# Patient Record
Sex: Female | Born: 1960
Health system: Southern US, Community
[De-identification: ages and names within clinical notes are randomized; demographics above are authoritative.]

## PROBLEM LIST (undated history)

## (undated) DIAGNOSIS — R079 Chest pain, unspecified: Secondary | ICD-10-CM

## (undated) DIAGNOSIS — G8918 Other acute postprocedural pain: Secondary | ICD-10-CM

## (undated) DIAGNOSIS — M797 Fibromyalgia: Secondary | ICD-10-CM

## (undated) DIAGNOSIS — G8929 Other chronic pain: Secondary | ICD-10-CM

## (undated) DIAGNOSIS — F32A Depression, unspecified: Secondary | ICD-10-CM

## (undated) DIAGNOSIS — K219 Gastro-esophageal reflux disease without esophagitis: Secondary | ICD-10-CM

## (undated) DIAGNOSIS — M1990 Unspecified osteoarthritis, unspecified site: Secondary | ICD-10-CM

## (undated) DIAGNOSIS — E7800 Pure hypercholesterolemia, unspecified: Secondary | ICD-10-CM

## (undated) DIAGNOSIS — F419 Anxiety disorder, unspecified: Secondary | ICD-10-CM

## (undated) DIAGNOSIS — F329 Major depressive disorder, single episode, unspecified: Secondary | ICD-10-CM

## (undated) HISTORY — DX: Fibromyalgia: M79.7

## (undated) HISTORY — DX: Pure hypercholesterolemia, unspecified: E78.00

## (undated) HISTORY — DX: Other acute postprocedural pain: G89.18

## (undated) HISTORY — PX: REDUCTION MAMMAPLASTY: SUR839

## (undated) HISTORY — DX: Major depressive disorder, single episode, unspecified: F32.9

## (undated) HISTORY — DX: Other chronic pain: G89.29

## (undated) HISTORY — DX: Anxiety disorder, unspecified: F41.9

## (undated) HISTORY — DX: Unspecified osteoarthritis, unspecified site: M19.90

## (undated) HISTORY — DX: Chest pain, unspecified: R07.9

## (undated) HISTORY — DX: Gastro-esophageal reflux disease without esophagitis: K21.9

## (undated) HISTORY — PX: CHOLECYSTECTOMY: SHX55

## (undated) HISTORY — DX: Depression, unspecified: F32.A

---

## 2006-04-27 ENCOUNTER — Ambulatory Visit: Payer: Self-pay | Admitting: Internal Medicine

## 2006-05-06 ENCOUNTER — Ambulatory Visit: Payer: Self-pay | Admitting: Internal Medicine

## 2006-11-07 HISTORY — PX: BACK SURGERY: SHX140

## 2007-09-21 ENCOUNTER — Inpatient Hospital Stay (HOSPITAL_COMMUNITY): Admission: RE | Admit: 2007-09-21 | Discharge: 2007-09-28 | Payer: Self-pay | Admitting: Neurosurgery

## 2011-03-22 NOTE — Op Note (Signed)
Jennifer Harmon, Jennifer Harmon              ACCOUNT NO.:  [id number]   MEDICAL RECORD NO.:  [id number]          PATIENT TYPE:  INP   LOCATION:  2899                         FACILITY:  [facility]   PHYSICIAN:  Danae Orleans. Venetia Maxon, M.D.  DATE OF BIRTH:  May 11, 1961   DATE OF PROCEDURE:  09/21/2007  DATE OF DISCHARGE:                               OPERATIVE REPORT   PREOPERATIVE DIAGNOSIS:  L4-L5 and L5-S1 spondylolisthesis with  spondylosis, herniated disc with lumbar radiculopathy.   POSTOPERATIVE DIAGNOSIS:  L4-L5 and L5-S1 spondylolisthesis with  spondylosis, herniated disc with lumbar radiculopathy.   PROCEDURE:  L4 and L5 Gill with L4-L5 and L5-S1 posterior lumbar  interbody fusion with PEEK interbody cages with morselized bone  autograft and bone morphogenic protein, pedicle screw fixation  bilaterally L4 through S1 levels with posterolateral arthrodesis, nerve  root monitoring.   SURGEON:  Danae Orleans. Venetia Maxon, M.D.   ASSISTANT:  Coletta Memos, M.D.  Georgiann Cocker, R.N.   ANESTHESIA:  General endotracheal anesthesia.   ESTIMATED BLOOD LOSS:  300 mL.   COMPLICATIONS:  None.   DISPOSITION:  To recovery.   INDICATIONS:  [name] is a 50 year old woman with bilateral  lower extremity pain, right greater than left, with L4-L5 mobile  spondylolisthesis greater than L5-S1 mobile spondylolisthesis with  significant spondylosis and foraminal stenosis.  We elected to take her  to surgery for decompression and fusion at these affected levels.   DESCRIPTION OF PROCEDURE:  [name] was brought to the operating  room.  Following satisfactory and uncomplicated induction of general  endotracheal anesthesia, placement of intravenous lines, and Foley  catheter, electrodes were placed overlying the L4, L5 and S1 nerve roots  and the patient was placed in a prone position on the [place] table.  Her low back was prepped and draped in the usual sterile fashion.  The  area of planned incision was  infiltrated with 0.25% Marcaine and 0.5%  lidocaine with 1:200,000 epinephrine.  The incision was made in the  midline and carried through copious adipose tissue.  The lumbodorsal  fascia was incised bilaterally.  Subperiosteal dissection was performed  exposing the L4 through S1 laminae, transverse processes, and sacral  alae.  A self-retaining retractor was placed to facilitate exposure.  The spinous processes were removed at L4 and L5 and laminectomies of L4  and L5 were also performed with decompression of the thecal sac, L4, L5,  and S1 nerve roots bilaterally.  There was marked adherent ligamentous  tissue on the L4 nerve root on the right.  This was carefully  decompressed under loupe magnification.  There was bilateral  decompression at both sets of nerve roots.  Baxano probes were passed  out the neural foramen, both ipsilaterally and contralaterally, without  difficulty.  A thorough discectomy was then performed at L4-L5 and L5-S1  levels and after scraping the endplates, a size 9 PEEK interbody spacer  was packed with BMP and also bone autograft.  A piece of BMP soaked  sponge was placed deep in the interspace and 9 mm PEEK cages were placed  bilaterally along with additional bone  autograft.  This was performed at  both the L4-L5 and L5-S1 levels.  Subsequently, pedicle screw fixation  was then placed bilaterally at L4, L5 and S1 levels.  The left L4  pedicle screw was somewhat lateral but had good purchase on manipulation  and appeared to be solidly in bone, so it was left in position.  All  screws were confirmed on AP and lateral fluoroscopy and EMG monitoring  demonstrated no abnormal stimulation suggestive of proximity to the  nerve roots.  60 mm pre-lordosed rods were placed and locked down in  situ and remaining bone autograft was placed posterolaterally on the  left.  The wound had been irrigated prior to placing the final bone  graft. The lumbodorsal fascia was closed  with #1 Vicryl sutures,  subcutaneous tissues were reapproximated with 2-0 Vicryl interrupted  inverted sutures, and the skin edges were reapproximated with  interrupted 3-0 Vicryl subcuticular stitch.  The wound was dressed with  Benzoin and Steri-Strips, Telfa gauze and tape.  The patient was  extubated in operating room and taken to the recovery room in stable  satisfactory condition having tolerated the operation well.  Counts were  correct at the end of the case.      Danae Orleans. Venetia Maxon, M.D.  Electronically Signed     JDS/MEDQ  D:  09/21/2007  T:  09/22/2007  Job:  [id number]

## 2011-03-25 NOTE — Discharge Summary (Signed)
Jennifer Harmon, Jennifer Harmon              ACCOUNT NO.:  [id number]   MEDICAL RECORD NO.:  [id number]          PATIENT TYPE:  INP   LOCATION:  3006                         FACILITY:  [facility]   PHYSICIAN:  Danae Orleans. Venetia Maxon, M.D.  DATE OF BIRTH:  Mar 09, 1961   DATE OF ADMISSION:  09/21/2007  DATE OF DISCHARGE:  09/28/2007                               DISCHARGE SUMMARY   REASON FOR ADMISSION:  Lumbar spondylolisthesis.   ADDITIONAL DIAGNOSIS:  Obesity, history of tobacco use, lumbar disc  displacement, lumbar sacral spondylosis, lumbar radiculopathy,  esophageal reflux.   FINAL DIAGNOSES:  Obesity, history of tobacco use, lumbar disc  displacement, lumbar sacral spondylosis, lumbar radiculopathy,  esophageal reflux.   HISTORY OF ILLNESS AND HOSPITAL COURSE:  Jennifer Harmon is a 50 year old  woman who had significant L4/L5 and L5/S1 spondylolisthesis with  stenosis.  She was admitted to the hospital and underwent uncomplicated  decompression and fusions L4/L5, and L5/S1 levels.  Postoperatively was  gradually mobilized.  Was given morphine PCA.  Was complaining of some  leg discomfort, which felt that it was most likely secondary to  cholesterol-lowering medications as it did not appear to be radicular in  nature.  She was gradually mobilized and working with physical therapy  and was discharged home on November 21, having tolerated the operation  and hospitalization well with instructions to follow up with Dr. Venetia Maxon  in the office in 3 weeks.  She was given rolling walker, prescription  for Percocet 5/625 one to two every 4-6 hours as needed for pain, Valium  10 mg every 8 hours  as needed for muscular spasm.      Danae Orleans. Venetia Maxon, M.D.  Electronically Signed     JDS/MEDQ  D:  11/09/2007  T:  11/09/2007  Job:  [id number]

## 2011-04-20 DIAGNOSIS — R079 Chest pain, unspecified: Secondary | ICD-10-CM

## 2011-04-26 DIAGNOSIS — R072 Precordial pain: Secondary | ICD-10-CM

## 2011-04-27 ENCOUNTER — Encounter: Payer: Self-pay | Admitting: Cardiology

## 2011-05-03 ENCOUNTER — Encounter: Payer: Self-pay | Admitting: Cardiology

## 2011-05-13 ENCOUNTER — Encounter: Payer: Self-pay | Admitting: Cardiovascular Disease

## 2011-08-16 LAB — TYPE AND SCREEN: Antibody Screen: NEGATIVE

## 2011-08-16 LAB — BASIC METABOLIC PANEL
BUN: 8
Calcium: 9.5
Creatinine, Ser: 0.66
GFR calc non Af Amer: >60

## 2011-08-16 LAB — CBC
HCT: 41.8
RBC: 4.81
RDW: 12.6
WBC: 10.6 — ABNORMAL HIGH

## 2011-08-16 LAB — ABO/RH: ABO/RH(D): B POS

## 2011-12-27 DIAGNOSIS — Z96653 Presence of artificial knee joint, bilateral: Secondary | ICD-10-CM | POA: Insufficient documentation

## 2012-03-14 DIAGNOSIS — M1711 Unilateral primary osteoarthritis, right knee: Secondary | ICD-10-CM | POA: Insufficient documentation

## 2016-09-19 DIAGNOSIS — N62 Hypertrophy of breast: Secondary | ICD-10-CM | POA: Insufficient documentation

## 2016-12-13 DIAGNOSIS — Z79891 Long term (current) use of opiate analgesic: Secondary | ICD-10-CM | POA: Diagnosis not present

## 2016-12-15 DIAGNOSIS — K219 Gastro-esophageal reflux disease without esophagitis: Secondary | ICD-10-CM | POA: Diagnosis not present

## 2016-12-15 DIAGNOSIS — Z299 Encounter for prophylactic measures, unspecified: Secondary | ICD-10-CM | POA: Diagnosis not present

## 2016-12-15 DIAGNOSIS — Z6835 Body mass index (BMI) 35.0-35.9, adult: Secondary | ICD-10-CM | POA: Diagnosis not present

## 2016-12-15 DIAGNOSIS — J069 Acute upper respiratory infection, unspecified: Secondary | ICD-10-CM | POA: Diagnosis not present

## 2016-12-15 DIAGNOSIS — Z713 Dietary counseling and surveillance: Secondary | ICD-10-CM | POA: Diagnosis not present

## 2016-12-15 DIAGNOSIS — R52 Pain, unspecified: Secondary | ICD-10-CM | POA: Diagnosis not present

## 2016-12-23 DIAGNOSIS — Z789 Other specified health status: Secondary | ICD-10-CM | POA: Diagnosis not present

## 2016-12-23 DIAGNOSIS — J069 Acute upper respiratory infection, unspecified: Secondary | ICD-10-CM | POA: Diagnosis not present

## 2016-12-23 DIAGNOSIS — I10 Essential (primary) hypertension: Secondary | ICD-10-CM | POA: Diagnosis not present

## 2016-12-23 DIAGNOSIS — Z6835 Body mass index (BMI) 35.0-35.9, adult: Secondary | ICD-10-CM | POA: Diagnosis not present

## 2016-12-23 DIAGNOSIS — Z713 Dietary counseling and surveillance: Secondary | ICD-10-CM | POA: Diagnosis not present

## 2016-12-23 DIAGNOSIS — Z299 Encounter for prophylactic measures, unspecified: Secondary | ICD-10-CM | POA: Diagnosis not present

## 2016-12-23 DIAGNOSIS — K219 Gastro-esophageal reflux disease without esophagitis: Secondary | ICD-10-CM | POA: Diagnosis not present

## 2016-12-23 DIAGNOSIS — E7800 Pure hypercholesterolemia, unspecified: Secondary | ICD-10-CM | POA: Diagnosis not present

## 2017-01-05 DIAGNOSIS — M797 Fibromyalgia: Secondary | ICD-10-CM | POA: Diagnosis not present

## 2017-01-05 DIAGNOSIS — Z6834 Body mass index (BMI) 34.0-34.9, adult: Secondary | ICD-10-CM | POA: Diagnosis not present

## 2017-01-05 DIAGNOSIS — Z713 Dietary counseling and surveillance: Secondary | ICD-10-CM | POA: Diagnosis not present

## 2017-01-05 DIAGNOSIS — Z299 Encounter for prophylactic measures, unspecified: Secondary | ICD-10-CM | POA: Diagnosis not present

## 2017-01-05 DIAGNOSIS — J069 Acute upper respiratory infection, unspecified: Secondary | ICD-10-CM | POA: Diagnosis not present

## 2017-01-05 DIAGNOSIS — M5416 Radiculopathy, lumbar region: Secondary | ICD-10-CM | POA: Diagnosis not present

## 2017-01-05 DIAGNOSIS — K219 Gastro-esophageal reflux disease without esophagitis: Secondary | ICD-10-CM | POA: Diagnosis not present

## 2017-01-05 DIAGNOSIS — I10 Essential (primary) hypertension: Secondary | ICD-10-CM | POA: Diagnosis not present

## 2017-01-12 DIAGNOSIS — Z79891 Long term (current) use of opiate analgesic: Secondary | ICD-10-CM | POA: Diagnosis not present

## 2017-01-12 DIAGNOSIS — M5136 Other intervertebral disc degeneration, lumbar region: Secondary | ICD-10-CM | POA: Diagnosis not present

## 2017-01-12 DIAGNOSIS — M797 Fibromyalgia: Secondary | ICD-10-CM | POA: Diagnosis not present

## 2017-01-12 DIAGNOSIS — G894 Chronic pain syndrome: Secondary | ICD-10-CM | POA: Diagnosis not present

## 2017-02-21 DIAGNOSIS — H40013 Open angle with borderline findings, low risk, bilateral: Secondary | ICD-10-CM | POA: Diagnosis not present

## 2017-03-16 DIAGNOSIS — M47816 Spondylosis without myelopathy or radiculopathy, lumbar region: Secondary | ICD-10-CM | POA: Diagnosis not present

## 2017-03-16 DIAGNOSIS — Z79891 Long term (current) use of opiate analgesic: Secondary | ICD-10-CM | POA: Diagnosis not present

## 2017-03-16 DIAGNOSIS — M5136 Other intervertebral disc degeneration, lumbar region: Secondary | ICD-10-CM | POA: Diagnosis not present

## 2017-03-16 DIAGNOSIS — G8929 Other chronic pain: Secondary | ICD-10-CM | POA: Diagnosis not present

## 2017-03-16 DIAGNOSIS — M797 Fibromyalgia: Secondary | ICD-10-CM | POA: Diagnosis not present

## 2017-03-21 DIAGNOSIS — Z299 Encounter for prophylactic measures, unspecified: Secondary | ICD-10-CM | POA: Diagnosis not present

## 2017-03-21 DIAGNOSIS — Z713 Dietary counseling and surveillance: Secondary | ICD-10-CM | POA: Diagnosis not present

## 2017-03-21 DIAGNOSIS — Z6836 Body mass index (BMI) 36.0-36.9, adult: Secondary | ICD-10-CM | POA: Diagnosis not present

## 2017-03-21 DIAGNOSIS — F329 Major depressive disorder, single episode, unspecified: Secondary | ICD-10-CM | POA: Diagnosis not present

## 2017-03-21 DIAGNOSIS — G4730 Sleep apnea, unspecified: Secondary | ICD-10-CM | POA: Diagnosis not present

## 2017-03-21 DIAGNOSIS — M1990 Unspecified osteoarthritis, unspecified site: Secondary | ICD-10-CM | POA: Diagnosis not present

## 2017-03-21 DIAGNOSIS — K5711 Diverticulosis of small intestine without perforation or abscess with bleeding: Secondary | ICD-10-CM | POA: Diagnosis not present

## 2017-03-21 DIAGNOSIS — E785 Hyperlipidemia, unspecified: Secondary | ICD-10-CM | POA: Diagnosis not present

## 2017-03-21 DIAGNOSIS — I10 Essential (primary) hypertension: Secondary | ICD-10-CM | POA: Diagnosis not present

## 2017-05-17 DIAGNOSIS — G8929 Other chronic pain: Secondary | ICD-10-CM | POA: Diagnosis not present

## 2017-05-17 DIAGNOSIS — M47816 Spondylosis without myelopathy or radiculopathy, lumbar region: Secondary | ICD-10-CM | POA: Diagnosis not present

## 2017-06-14 DIAGNOSIS — M5136 Other intervertebral disc degeneration, lumbar region: Secondary | ICD-10-CM | POA: Diagnosis not present

## 2017-06-14 DIAGNOSIS — Z6836 Body mass index (BMI) 36.0-36.9, adult: Secondary | ICD-10-CM | POA: Diagnosis not present

## 2017-06-14 DIAGNOSIS — E785 Hyperlipidemia, unspecified: Secondary | ICD-10-CM | POA: Diagnosis not present

## 2017-06-14 DIAGNOSIS — Z299 Encounter for prophylactic measures, unspecified: Secondary | ICD-10-CM | POA: Diagnosis not present

## 2017-06-14 DIAGNOSIS — I10 Essential (primary) hypertension: Secondary | ICD-10-CM | POA: Diagnosis not present

## 2017-06-14 DIAGNOSIS — G4730 Sleep apnea, unspecified: Secondary | ICD-10-CM | POA: Diagnosis not present

## 2017-06-14 DIAGNOSIS — F419 Anxiety disorder, unspecified: Secondary | ICD-10-CM | POA: Diagnosis not present

## 2017-06-14 DIAGNOSIS — F329 Major depressive disorder, single episode, unspecified: Secondary | ICD-10-CM | POA: Diagnosis not present

## 2017-06-14 DIAGNOSIS — G8929 Other chronic pain: Secondary | ICD-10-CM | POA: Diagnosis not present

## 2017-06-30 DIAGNOSIS — M5136 Other intervertebral disc degeneration, lumbar region: Secondary | ICD-10-CM | POA: Insufficient documentation

## 2017-06-30 DIAGNOSIS — Z96651 Presence of right artificial knee joint: Secondary | ICD-10-CM | POA: Insufficient documentation

## 2017-06-30 DIAGNOSIS — M19072 Primary osteoarthritis, left ankle and foot: Secondary | ICD-10-CM

## 2017-06-30 DIAGNOSIS — Z8719 Personal history of other diseases of the digestive system: Secondary | ICD-10-CM | POA: Insufficient documentation

## 2017-06-30 DIAGNOSIS — Z8659 Personal history of other mental and behavioral disorders: Secondary | ICD-10-CM | POA: Insufficient documentation

## 2017-06-30 DIAGNOSIS — Z8669 Personal history of other diseases of the nervous system and sense organs: Secondary | ICD-10-CM | POA: Insufficient documentation

## 2017-06-30 DIAGNOSIS — M19071 Primary osteoarthritis, right ankle and foot: Secondary | ICD-10-CM | POA: Insufficient documentation

## 2017-06-30 DIAGNOSIS — E785 Hyperlipidemia, unspecified: Secondary | ICD-10-CM | POA: Insufficient documentation

## 2017-06-30 DIAGNOSIS — K529 Noninfective gastroenteritis and colitis, unspecified: Secondary | ICD-10-CM | POA: Insufficient documentation

## 2017-06-30 DIAGNOSIS — Z87891 Personal history of nicotine dependence: Secondary | ICD-10-CM | POA: Insufficient documentation

## 2017-06-30 DIAGNOSIS — Z96652 Presence of left artificial knee joint: Secondary | ICD-10-CM | POA: Insufficient documentation

## 2017-06-30 DIAGNOSIS — M797 Fibromyalgia: Secondary | ICD-10-CM | POA: Insufficient documentation

## 2017-06-30 NOTE — Progress Notes (Signed)
Office Visit Note  Patient: Jennifer Harmon             Date of Birth: 1961/03/05           MRN: [id number]             PCP: Glenda Chroman, MD Referring: Glenda Chroman, MD Visit Date: 07/03/2017 Occupation: on dusability    Subjective:  Pain in hands.   History of Present Illness: Jennifer Harmon is a 56 y.o. female  returns after her last visit in May 2016. Patient was diagnosed with seronegative inflammatory arthritis. She was started on Plaquenil. She had inadequate response to Plaquenil. She lost follow-up after May 2016. According to patient she stopped Plaquenil as she felt it was not working well. She states over time her hands have been getting worse. She has noticed that her hands are more deformed and she is feeling some knots on her forearms and the skin. She has pain in her right shoulder and right elbow as well. She has pain all over her body. She has some discomfort in her bilateral knee replacement also. She also has generalized pain from fibromyalgia.  Activities of Daily Living:  Patient reports morning stiffness for 2 hours.   Patient Reports nocturnal pain.  Difficulty dressing/grooming: Reports Difficulty climbing stairs: Reports Difficulty getting out of chair: Reports Difficulty using hands for taps, buttons, cutlery, and/or writing: Reports   Review of Systems  Constitutional: Positive for fatigue. Negative for night sweats, weight gain, weight loss and weakness.  HENT: Positive for mouth dryness. Negative for mouth sores, trouble swallowing, trouble swallowing and nose dryness.   Eyes: Positive for dryness. Negative for pain, redness and visual disturbance.  Respiratory: Negative for cough, shortness of breath and difficulty breathing.   Cardiovascular: Negative for chest pain, palpitations, hypertension, irregular heartbeat and swelling in legs/feet.  Gastrointestinal: Negative for blood in stool, constipation and diarrhea.  Endocrine: Negative for  increased urination.  Genitourinary: Negative for vaginal dryness.  Musculoskeletal: Positive for arthralgias, joint pain, myalgias, morning stiffness and myalgias. Negative for joint swelling, muscle weakness and muscle tenderness.  Skin: Negative for color change, rash, hair loss, skin tightness, ulcers and sensitivity to sunlight.  Allergic/Immunologic: Negative for susceptible to infections.  Neurological: Negative for dizziness, memory loss and night sweats.  Hematological: Negative for swollen glands.  Psychiatric/Behavioral: Positive for depressed mood. Negative for sleep disturbance. The patient is nervous/anxious.     PMFS History:  Patient Active Problem List   Diagnosis Date Noted  . Dyslipidemia 06/30/2017  . History of gastroesophageal reflux (GERD) 06/30/2017  . History of sleep apnea 06/30/2017  . History of depression 06/30/2017  . History of anxiety 06/30/2017  . Fibromyalgia 06/30/2017  . DDD (degenerative disc disease), lumbar 06/30/2017  . Status post left partial knee replacement 06/30/2017  . Status post total knee replacement, right 06/30/2017  . Primary osteoarthritis of both feet 06/30/2017  . Other chronic pain 06/30/2017  . History of diverticulosis 06/30/2017  . Former smoker 06/30/2017  . Chronic diarrhea 06/30/2017    Past Medical History:  Diagnosis Date  . Anxiety   . Chronic chest pain   . Depression   . DJD (degenerative joint disease)   . Fibromyalgia   . GERD (gastroesophageal reflux disease)   . Hypercholesterolemia     Family History  Problem Relation Age of Onset  . Hypertension Mother   . Arthritis Father    Past Surgical History:  Procedure Laterality Date  .  BACK SURGERY  2008  . CESAREAN SECTION    . CHOLECYSTECTOMY     Social History   Social History Narrative  . No narrative on file     Objective: Vital Signs: BP (!) 142/78   Pulse 82   Resp 16   Ht 5\' 3"  (1.6 m)   Wt 200 lb (90.7 kg)   BMI 35.43 kg/m     Physical Exam  Constitutional: She is oriented to person, place, and time. She appears well-developed and well-nourished.  HENT:  Head: Normocephalic and atraumatic.  Eyes: Conjunctivae and EOM are normal.  Neck: Normal range of motion.  Cardiovascular: Normal rate, regular rhythm, normal heart sounds and intact distal pulses.   Pulmonary/Chest: Effort normal and breath sounds normal.  Abdominal: Soft. Bowel sounds are normal.  Lymphadenopathy:    She has no cervical adenopathy.  Neurological: She is alert and oriented to person, place, and time.  Skin: Skin is warm and dry. Capillary refill takes less than 2 seconds.  Psychiatric: She has a normal mood and affect. Her behavior is normal.  Nursing note and vitals reviewed.    Musculoskeletal Exam: C-spine limited range of motion thoracic spine good range of motion lumbar spine limited painful range of motion. She has discomfort range of motion of her right shoulder. Elbow joints good range of motion. She has limited range of motion of her wrist joints with some discomfort and tenderness over her MCP joints. Hip joints are good range of motion. She has some bilateral knee replacement without much discomfort. Ankles MTPs PIPs did not have much inflammation.  CDAI Exam: No CDAI exam completed.    Investigation: Findings:  August 2014 CBC normal, CMP normal, CK normal, UA normal, ESR normal, ANA negative, ENA negative, RF negative, anti-CCP negative, Ace 65, TB gold negative, SPEP normal, immunoglobulins normal, HIV negative, G6PD normal, Chest x-ray normal    Imaging: September 2014 Extrem Up Bilat Comp  Result Date: 07/03/2017 Ultrasound examination of bilateral hands was performed per EULAR recommendations. Using 12 MHz transducer, grayscale and power Doppler bilateral second, third, and fifth MCP joints and bilateral wrist joints both dorsal and volar aspects were evaluated to look for synovitis or tenosynovitis. The findings were there was  no synovitis or tenosynovitis on ultrasound examination. Right median nerve was 0.11 cm squares which was within normal limits and left median nerve was 0.12 cm squares which was within normal limits. Impression: Ultrasound examination of bilateral hands did not show any synovitis.  Xr Hand 2 View Left  Result Date: 07/03/2017 PIP/DIP narrowing was noted. No MCP joint narrowing was noted. CMC joint narrowing was noted. No intercarpal joint space narrowing was noted. No erosive changes were noted. These findings were consistent with osteoarthritis.  Xr Hand 2 View Right  Result Date: 07/03/2017 PIP/DIP narrowing was noted. Some spurring noted at first second and fourth MCP joint. No significant intercarpal joint space narrowing was noted. No erosive changes were noted. Impression: Findings were consistent with osteoarthritis of the hand  Xr Shoulder Right  Result Date: 07/03/2017 No significant glenohumeral joint space narrowing was noted. Some osteoarthritic changes and inferior spurring was noted.   Speciality Comments: No specialty comments available.    Procedures:  No procedures performed Allergies: Bupropion and Tape   Assessment / Plan:     Visit Diagnoses: Chronic inflammatory arthritis - Seronegative. She was given a trial of Plaquenil about 2 years ago. She lost follow-up after that. She states she continues to have pain and  swelling in her hands. I do not see any synovitis on examination today. I'll obtain following labs today. I also obtain x-rays of her bilateral hands today which were unremarkable except for some osteoarthritic changes. We decided to proceed with ultrasound examination of bilateral hands to look for synovitis. The ultrasound did not show any synovitis in her wrists joints or her MCP joints. At this point I believe she has some underlying osteoarthritis. - Plan: Urinalysis, Routine w reflex microscopic, Sedimentation rate, Rheumatoid factor, Cyclic citrul peptide  antibody, IgG, 14-3-3 eta Protein, Ferritin, Iron and TIBC, Magnesium  High risk medication use - Treated with Plaquenil in the past with inadequate response.  Chronic right shoulder pain - Plan: XR Shoulder Right  Pain in both hands - Plan: XR Hand 2 View Right, XR Hand 2 View Left, Ferritin, Iron and TIBC, Magnesium, [place] Extrem Up Bilat Comp  Other fatigue - Plan: CBC with Differential/Platelet, COMPLETE METABOLIC PANEL WITH GFR, CK, TSH, Ferritin, Iron and TIBC, Magnesium   Status post total knee replacement, right: Chronic pain  Status post left partial knee replacement: Chronic pain  Primary osteoarthritis of both feet: Proper fitting shoes discussed.  DDD (degenerative disc disease), lumbar - Status post discectomy. She continues to have discomfort in her lower back.  Fibromyalgia: She has generalized pain from fibromyalgia which is her major problem.  Her other medical problems are listed as follows: History of anxiety  History of depression  History of sleep apnea - On CPAP  History of gastroesophageal reflux (GERD)  Dyslipidemia  Other chronic pain - Followed up by Dr. Francesco Runner. She is on Lyrica, oxycodone, Flexeril, Klonopin.  History of diverticulosis  Former smoker  Chronic diarrhea - Post cholecystectomy    Orders: Orders Placed This Encounter  Procedures  . XR Shoulder Right  . XR Hand 2 View Right  . XR Hand 2 View Left  . [place] Extrem Up Bilat Comp  . CBC with Differential/Platelet  . COMPLETE METABOLIC PANEL WITH GFR  . Urinalysis, Routine w reflex microscopic  . Sedimentation rate  . CK  . TSH  . Rheumatoid factor  . Cyclic citrul peptide antibody, IgG  . 14-3-3 eta Protein  . Ferritin  . Iron and TIBC  . Magnesium   No orders of the defined types were placed in this encounter.   Face-to-face time spent with patient was 60 minutes.  Greater than 50% of time was spent in counseling and coordination of care.  Follow-Up Instructions:  one-month   Bo Merino, MD  Note - This record has been created using Editor, commissioning.  Chart creation errors have been sought, but may not always  have been located. Such creation errors do not reflect on  the standard of medical care.

## 2017-07-03 ENCOUNTER — Ambulatory Visit (INDEPENDENT_AMBULATORY_CARE_PROVIDER_SITE_OTHER): Payer: PPO

## 2017-07-03 ENCOUNTER — Encounter: Payer: Self-pay | Admitting: Rheumatology

## 2017-07-03 ENCOUNTER — Ambulatory Visit (INDEPENDENT_AMBULATORY_CARE_PROVIDER_SITE_OTHER): Payer: PPO | Admitting: Rheumatology

## 2017-07-03 ENCOUNTER — Inpatient Hospital Stay (INDEPENDENT_AMBULATORY_CARE_PROVIDER_SITE_OTHER): Payer: PPO

## 2017-07-03 VITALS — BP 142/78 | HR 82 | Resp 16 | Ht 63.0 in | Wt 200.0 lb

## 2017-07-03 DIAGNOSIS — Z79899 Other long term (current) drug therapy: Secondary | ICD-10-CM

## 2017-07-03 DIAGNOSIS — M19071 Primary osteoarthritis, right ankle and foot: Secondary | ICD-10-CM

## 2017-07-03 DIAGNOSIS — G8929 Other chronic pain: Secondary | ICD-10-CM

## 2017-07-03 DIAGNOSIS — M797 Fibromyalgia: Secondary | ICD-10-CM

## 2017-07-03 DIAGNOSIS — Z96652 Presence of left artificial knee joint: Secondary | ICD-10-CM

## 2017-07-03 DIAGNOSIS — Z8669 Personal history of other diseases of the nervous system and sense organs: Secondary | ICD-10-CM

## 2017-07-03 DIAGNOSIS — Z8659 Personal history of other mental and behavioral disorders: Secondary | ICD-10-CM

## 2017-07-03 DIAGNOSIS — M79641 Pain in right hand: Secondary | ICD-10-CM | POA: Diagnosis not present

## 2017-07-03 DIAGNOSIS — M79642 Pain in left hand: Secondary | ICD-10-CM | POA: Diagnosis not present

## 2017-07-03 DIAGNOSIS — Z8719 Personal history of other diseases of the digestive system: Secondary | ICD-10-CM | POA: Diagnosis not present

## 2017-07-03 DIAGNOSIS — M5136 Other intervertebral disc degeneration, lumbar region: Secondary | ICD-10-CM | POA: Diagnosis not present

## 2017-07-03 DIAGNOSIS — E785 Hyperlipidemia, unspecified: Secondary | ICD-10-CM

## 2017-07-03 DIAGNOSIS — Z96651 Presence of right artificial knee joint: Secondary | ICD-10-CM

## 2017-07-03 DIAGNOSIS — K529 Noninfective gastroenteritis and colitis, unspecified: Secondary | ICD-10-CM

## 2017-07-03 DIAGNOSIS — M19072 Primary osteoarthritis, left ankle and foot: Secondary | ICD-10-CM

## 2017-07-03 DIAGNOSIS — M1990 Unspecified osteoarthritis, unspecified site: Secondary | ICD-10-CM | POA: Diagnosis not present

## 2017-07-03 DIAGNOSIS — M25511 Pain in right shoulder: Secondary | ICD-10-CM | POA: Diagnosis not present

## 2017-07-03 DIAGNOSIS — R5383 Other fatigue: Secondary | ICD-10-CM

## 2017-07-03 DIAGNOSIS — Z87891 Personal history of nicotine dependence: Secondary | ICD-10-CM

## 2017-07-04 LAB — RHEUMATOID FACTOR

## 2017-07-04 LAB — COMPLETE METABOLIC PANEL WITH GFR
ALBUMIN: 4.6 g/dL (ref 3.6–5.1)
ALK PHOS: 131 U/L — AB (ref 33–130)
ALT: 23 U/L (ref 6–29)
AST: 18 U/L (ref 10–35)
BUN: 16 mg/dL (ref 7–25)
CALCIUM: 10.2 mg/dL (ref 8.6–10.4)
CO2: 26 mmol/L (ref 20–32)
Chloride: 100 mmol/L (ref 98–110)
Creat: 0.69 mg/dL (ref 0.50–1.05)
Glucose, Bld: 99 mg/dL (ref 65–99)
Potassium: 4.3 mmol/L (ref 3.5–5.3)
Sodium: 142 mmol/L (ref 135–146)
TOTAL PROTEIN: 7.2 g/dL (ref 6.1–8.1)
Total Bilirubin: 0.4 mg/dL (ref 0.2–1.2)

## 2017-07-04 LAB — FERRITIN: Ferritin: 47 ng/mL (ref 10–232)

## 2017-07-04 LAB — IRON AND TIBC
%SAT: 15 % (ref 11–50)
IRON: 55 ug/dL (ref 45–160)
TIBC: 360 ug/dL (ref 250–450)
UIBC: 305 ug/dL

## 2017-07-04 LAB — CBC WITH DIFFERENTIAL/PLATELET
BASOS ABS: 89 {cells}/uL (ref 0–200)
BASOS PCT: 1 %
EOS ABS: 267 {cells}/uL (ref 15–500)
Eosinophils Relative: 3 %
HEMATOCRIT: 43.3 % (ref 35.0–45.0)
HEMOGLOBIN: 14 g/dL (ref 11.7–15.5)
Lymphocytes Relative: 27 %
Lymphs Abs: 2403 cells/uL (ref 850–3900)
MCH: 28.3 pg (ref 27.0–33.0)
MCHC: 32.3 g/dL (ref 32.0–36.0)
MCV: 87.5 fL (ref 80.0–100.0)
MONO ABS: 445 {cells}/uL (ref 200–950)
MONOS PCT: 5 %
MPV: 10.1 fL (ref 7.5–12.5)
NEUTROS ABS: 5696 {cells}/uL (ref 1500–7800)
Neutrophils Relative %: 64 %
PLATELETS: 197 10*3/uL (ref 140–400)
RBC: 4.95 MIL/uL (ref 3.80–5.10)
RDW: 14 % (ref 11.0–15.0)
WBC: 8.9 10*3/uL (ref 3.8–10.8)

## 2017-07-04 LAB — URINALYSIS, ROUTINE W REFLEX MICROSCOPIC
BILIRUBIN URINE: NEGATIVE
GLUCOSE, UA: NEGATIVE
Hgb urine dipstick: NEGATIVE
Ketones, ur: NEGATIVE
LEUKOCYTES UA: NEGATIVE
Nitrite: NEGATIVE
PH: 6.5 (ref 5.0–8.0)
Protein, ur: NEGATIVE
SPECIFIC GRAVITY, URINE: 1.025 (ref 1.001–1.035)

## 2017-07-04 LAB — CK: Total CK: 130 U/L (ref 29–143)

## 2017-07-04 LAB — MAGNESIUM: Magnesium: 2 mg/dL (ref 1.5–2.5)

## 2017-07-04 LAB — TSH: TSH: 2.33 m[IU]/L

## 2017-07-04 LAB — SEDIMENTATION RATE: Sed Rate: 5 mm/hr (ref 0–30)

## 2017-07-04 LAB — CYCLIC CITRUL PEPTIDE ANTIBODY, IGG

## 2017-07-05 DIAGNOSIS — G4733 Obstructive sleep apnea (adult) (pediatric): Secondary | ICD-10-CM | POA: Diagnosis not present

## 2017-07-06 LAB — 14-3-3 ETA PROTEIN

## 2017-07-07 NOTE — Progress Notes (Signed)
Labs are normal. We'll discuss at follow-up visit.

## 2017-07-21 DIAGNOSIS — Z6834 Body mass index (BMI) 34.0-34.9, adult: Secondary | ICD-10-CM | POA: Diagnosis not present

## 2017-07-21 DIAGNOSIS — Z1389 Encounter for screening for other disorder: Secondary | ICD-10-CM | POA: Diagnosis not present

## 2017-07-21 DIAGNOSIS — Z0000 Encounter for general adult medical examination without abnormal findings: Secondary | ICD-10-CM | POA: Diagnosis not present

## 2017-07-21 DIAGNOSIS — R5383 Other fatigue: Secondary | ICD-10-CM | POA: Diagnosis not present

## 2017-07-21 DIAGNOSIS — Z1211 Encounter for screening for malignant neoplasm of colon: Secondary | ICD-10-CM | POA: Diagnosis not present

## 2017-07-21 DIAGNOSIS — E7800 Pure hypercholesterolemia, unspecified: Secondary | ICD-10-CM | POA: Diagnosis not present

## 2017-07-21 DIAGNOSIS — G8929 Other chronic pain: Secondary | ICD-10-CM | POA: Diagnosis not present

## 2017-07-21 DIAGNOSIS — Z7189 Other specified counseling: Secondary | ICD-10-CM | POA: Diagnosis not present

## 2017-07-21 DIAGNOSIS — Z299 Encounter for prophylactic measures, unspecified: Secondary | ICD-10-CM | POA: Diagnosis not present

## 2017-07-21 DIAGNOSIS — Z79899 Other long term (current) drug therapy: Secondary | ICD-10-CM | POA: Diagnosis not present

## 2017-07-21 DIAGNOSIS — M5416 Radiculopathy, lumbar region: Secondary | ICD-10-CM | POA: Diagnosis not present

## 2017-07-21 DIAGNOSIS — F419 Anxiety disorder, unspecified: Secondary | ICD-10-CM | POA: Diagnosis not present

## 2017-07-24 DIAGNOSIS — M1990 Unspecified osteoarthritis, unspecified site: Secondary | ICD-10-CM | POA: Insufficient documentation

## 2017-07-24 NOTE — Progress Notes (Deleted)
Office Visit Note  Patient: Jennifer Harmon             Date of Birth: [birth year]           MRN: [id number]             PCP: Ignatius Specking, MD Referring: Ignatius Specking, MD Visit Date: 08/03/2017 Occupation: @GUAROCC @    Subjective:  No chief complaint on file.   History of Present Illness: Jennifer Harmon is a 56 y.o. female ***   Activities of Daily Living:  Patient reports morning stiffness for *** {minute/hour:19697}.   Patient {ACTIONS;DENIES/REPORTS:21021675::"Denies"} nocturnal pain.  Difficulty dressing/grooming: {ACTIONS;DENIES/REPORTS:21021675::"Denies"} Difficulty climbing stairs: {ACTIONS;DENIES/REPORTS:21021675::"Denies"} Difficulty getting out of chair: {ACTIONS;DENIES/REPORTS:21021675::"Denies"} Difficulty using hands for taps, buttons, cutlery, and/or writing: {ACTIONS;DENIES/REPORTS:21021675::"Denies"}   No Rheumatology ROS completed.   PMFS History:  Patient Active Problem List   Diagnosis Date Noted  . Chronic inflammatory arthritis 07/24/2017  . Dyslipidemia 06/30/2017  . History of gastroesophageal reflux (GERD) 06/30/2017  . History of sleep apnea 06/30/2017  . History of depression 06/30/2017  . History of anxiety 06/30/2017  . Fibromyalgia 06/30/2017  . DDD (degenerative disc disease), lumbar 06/30/2017  . Status post left partial knee replacement 06/30/2017  . Status post total knee replacement, right 06/30/2017  . Primary osteoarthritis of both feet 06/30/2017  . Other chronic pain 06/30/2017  . History of diverticulosis 06/30/2017  . Former smoker 06/30/2017  . Chronic diarrhea 06/30/2017    Past Medical History:  Diagnosis Date  . Anxiety   . Chronic chest pain   . Depression   . DJD (degenerative joint disease)   . Fibromyalgia   . GERD (gastroesophageal reflux disease)   . Hypercholesterolemia     Family History  Problem Relation Age of Onset  . Hypertension Mother   . Arthritis Father    Past Surgical History:  Procedure  Laterality Date  . BACK SURGERY  2008  . CESAREAN SECTION    . CHOLECYSTECTOMY     Social History   Social History Narrative  . No narrative on file     Objective: Vital Signs: There were no vitals taken for this visit.   Physical Exam   Musculoskeletal Exam: ***  CDAI Exam: No CDAI exam completed.    Investigation: Findings:  Urinalysis, Routine w reflex microscopic, Sedimentation rate, Rheumatoid factor, Cyclic citrul peptide antibody, IgG, 14-3-3 eta Protein, Ferritin, Iron and TIBC, Magnesium all normal / negative   CBC Latest Ref Rng & Units 07/03/2017 09/21/2007 09/19/2007  WBC 3.8 - 10.8 K/uL 8.9 17.2 **Please note change in CBC/Diff reference range**(H) 10.6 **Please note change in CBC/Diff reference range**(H)  Hemoglobin 11.7 - 15.5 g/dL 13/10/2007 11.5(L) 14.4  Hematocrit 35.0 - 45.0 % 43.3 33.7(L) 41.8  Platelets 140 - 400 K/uL 197 267 DELTA CHECK NOTED 366     CMP Latest Ref Rng & Units 07/03/2017 09/27/2007  Glucose 65 - 99 mg/dL 99 98  BUN 7 - 25 mg/dL 16 8  Creatinine 09/29/2007 - 1.05 mg/dL 3.23 5.57  Sodium 3.22 - 146 mmol/L 142 142  Potassium 3.5 - 5.3 mmol/L 4.3 3.7  Chloride 98 - 110 mmol/L 100 103  CO2 20 - 32 mmol/L 26 28  Calcium 8.6 - 10.4 mg/dL 025 9.5  Total Protein 6.1 - 8.1 g/dL 7.2 -  Total Bilirubin 0.2 - 1.2 mg/dL 0.4 -  Alkaline Phos 33 - 130 U/L 131(H) -  AST 10 - 35 U/L 18 -  ALT 6 -  29 U/L 23 -    Imaging: [place] Extrem Up Bilat Comp  Result Date: 07/03/2017 Ultrasound examination of bilateral hands was performed per EULAR recommendations. Using 12 MHz transducer, grayscale and power Doppler bilateral second, third, and fifth MCP joints and bilateral wrist joints both dorsal and volar aspects were evaluated to look for synovitis or tenosynovitis. The findings were there was no synovitis or tenosynovitis on ultrasound examination. Right median nerve was 0.11 cm squares which was within normal limits and left median nerve was 0.12 cm squares which  was within normal limits. Impression: Ultrasound examination of bilateral hands did not show any synovitis.  Xr Hand 2 View Left  Result Date: 07/03/2017 PIP/DIP narrowing was noted. No MCP joint narrowing was noted. CMC joint narrowing was noted. No intercarpal joint space narrowing was noted. No erosive changes were noted. These findings were consistent with osteoarthritis.  Xr Hand 2 View Right  Result Date: 07/03/2017 PIP/DIP narrowing was noted. Some spurring noted at first second and fourth MCP joint. No significant intercarpal joint space narrowing was noted. No erosive changes were noted. Impression: Findings were consistent with osteoarthritis of the hand  Xr Shoulder Right  Result Date: 07/03/2017 No significant glenohumeral joint space narrowing was noted. Some osteoarthritic changes and inferior spurring was noted.   Speciality Comments: No specialty comments available.    Procedures:  No procedures performed Allergies: Bupropion and Tape   Assessment / Plan:     Visit Diagnoses: Chronic inflammatory arthritis  DDD (degenerative disc disease), lumbar s/p discectomy  History of fibromyalgia  Other chronic pain  History of gastroesophageal reflux (GERD)  History of sleep apnea  History of diverticulosis  History of depression  History of anxiety  Primary osteoarthritis of both feet  Status post left partial knee replacement  Status post total knee replacement, right  History of hyperlipidemia    Orders: No orders of the defined types were placed in this encounter.  No orders of the defined types were placed in this encounter.   Face-to-face time spent with patient was *** minutes. 50% of time was spent in counseling and coordination of care.  Follow-Up Instructions: No Follow-up on file.   Amy Littrell, RT  Note - This record has been created using [employer].  Chart creation errors have been sought, but may not always  have been  located. Such creation errors do not reflect on  the standard of medical care.

## 2017-08-03 ENCOUNTER — Ambulatory Visit: Payer: Self-pay | Admitting: Rheumatology

## 2017-08-11 DIAGNOSIS — K219 Gastro-esophageal reflux disease without esophagitis: Secondary | ICD-10-CM | POA: Diagnosis not present

## 2017-08-11 DIAGNOSIS — K5909 Other constipation: Secondary | ICD-10-CM | POA: Diagnosis not present

## 2017-08-11 DIAGNOSIS — Z299 Encounter for prophylactic measures, unspecified: Secondary | ICD-10-CM | POA: Diagnosis not present

## 2017-08-11 DIAGNOSIS — G4730 Sleep apnea, unspecified: Secondary | ICD-10-CM | POA: Diagnosis not present

## 2017-08-11 DIAGNOSIS — E7800 Pure hypercholesterolemia, unspecified: Secondary | ICD-10-CM | POA: Diagnosis not present

## 2017-08-11 DIAGNOSIS — M5416 Radiculopathy, lumbar region: Secondary | ICD-10-CM | POA: Diagnosis not present

## 2017-08-11 DIAGNOSIS — I10 Essential (primary) hypertension: Secondary | ICD-10-CM | POA: Diagnosis not present

## 2017-08-11 DIAGNOSIS — G8929 Other chronic pain: Secondary | ICD-10-CM | POA: Diagnosis not present

## 2017-08-11 DIAGNOSIS — M5136 Other intervertebral disc degeneration, lumbar region: Secondary | ICD-10-CM | POA: Diagnosis not present

## 2017-08-13 NOTE — Progress Notes (Signed)
Office Visit Note  Patient: Jennifer Harmon             Date of Birth: 1960/11/14           MRN: [id number]             PCP: Ignatius Specking, MD Referring: Ignatius Specking, MD Visit Date: 08/24/2017 Occupation: @GUAROCC @    Subjective:  Pain right shoulder and hands.   History of Present Illness: Jennifer Harmon is a 56 y.o. female with history of osteoarthritis, fibromyalgia and disc disease. She's been having a lot of pain and discomfort in her right shoulder. She states she has difficulty lifting her right arm. She also has discomfort in her bilateral hands and her knee joints. She also has some lower back pain.  Activities of Daily Living:  Patient reports morning stiffness for 1 hour.   Patient Reports nocturnal pain.  Difficulty dressing/grooming: Denies Difficulty climbing stairs: Reports Difficulty getting out of chair: Reports Difficulty using hands for taps, buttons, cutlery, and/or writing: Reports   Review of Systems  Constitutional: Positive for fatigue. Negative for night sweats, weight gain, weight loss and weakness.  HENT: Positive for mouth sores. Negative for trouble swallowing, trouble swallowing, mouth dryness and nose dryness.   Eyes: Positive for dryness. Negative for pain, redness and visual disturbance.  Respiratory: Negative for cough, shortness of breath and difficulty breathing.   Cardiovascular: Negative for chest pain, palpitations, hypertension, irregular heartbeat and swelling in legs/feet.  Gastrointestinal: Positive for constipation. Negative for blood in stool and diarrhea.  Endocrine: Negative for increased urination.  Genitourinary: Negative for vaginal dryness.  Musculoskeletal: Positive for arthralgias, joint pain, myalgias, morning stiffness and myalgias. Negative for joint swelling, muscle weakness and muscle tenderness.  Skin: Negative for color change, rash, hair loss, skin tightness, ulcers and sensitivity to sunlight.    Allergic/Immunologic: Negative for susceptible to infections.  Neurological: Negative for dizziness, memory loss and night sweats.  Hematological: Negative for swollen glands.  Psychiatric/Behavioral: Positive for depressed mood and sleep disturbance. The patient is nervous/anxious.     PMFS History:  Patient Active Problem List   Diagnosis Date Noted  . Chronic inflammatory arthritis 07/24/2017  . Dyslipidemia 06/30/2017  . History of gastroesophageal reflux (GERD) 06/30/2017  . History of sleep apnea 06/30/2017  . History of depression 06/30/2017  . History of anxiety 06/30/2017  . Fibromyalgia 06/30/2017  . DDD (degenerative disc disease), lumbar 06/30/2017  . Status post left partial knee replacement 06/30/2017  . Status post total knee replacement, right 06/30/2017  . Primary osteoarthritis of both feet 06/30/2017  . Other chronic pain 06/30/2017  . History of diverticulosis 06/30/2017  . Former smoker 06/30/2017  . Chronic diarrhea 06/30/2017    Past Medical History:  Diagnosis Date  . Anxiety   . Chronic chest pain   . Depression   . DJD (degenerative joint disease)   . Fibromyalgia   . GERD (gastroesophageal reflux disease)   . Hypercholesterolemia     Family History  Problem Relation Age of Onset  . Hypertension Mother   . Arthritis Father    Past Surgical History:  Procedure Laterality Date  . BACK SURGERY  2008  . CESAREAN SECTION    . CHOLECYSTECTOMY     Social History   Social History Narrative  . No narrative on file     Objective: Vital Signs: BP 136/84 (BP Location: Left Arm, Patient Position: Sitting, Cuff Size: Normal)   Pulse 95  Ht 5\' 3"  (1.6 m)   Wt 202 lb (91.6 kg)   BMI 35.78 kg/m    Physical Exam  Constitutional: She is oriented to person, place, and time. She appears well-developed and well-nourished.  HENT:  Head: Normocephalic and atraumatic.  Eyes: Conjunctivae and EOM are normal.  Neck: Normal range of motion.   Cardiovascular: Normal rate, regular rhythm, normal heart sounds and intact distal pulses.   Pulmonary/Chest: Effort normal and breath sounds normal.  Abdominal: Soft. Bowel sounds are normal.  Lymphadenopathy:    She has no cervical adenopathy.  Neurological: She is alert and oriented to person, place, and time.  Skin: Skin is warm and dry. Capillary refill takes less than 2 seconds.  Psychiatric: She has a normal mood and affect. Her behavior is normal.  Nursing note and vitals reviewed.    Musculoskeletal Exam: C-spine and thoracic lumbar spine limited range of motion with discomfort. She has right shoulder joint painful abduction limited to about 50. Left shoulder joint, bilateral elbow joints, wrist joints with good range of motion with no synovitis. She is tenderness over MCPs and PIPs but no synovitis was noted. She is some osteoarthritic changes in her hands and feet. She had left partial knee replacement and right total knee replacement which is still painful. She generalized hyperalgesia from fibromyalgia.  CDAI Exam: No CDAI exam completed.    Investigation: No additional findings. CBC Latest Ref Rng & Units 07/03/2017 09/21/2007 09/19/2007  WBC 3.8 - 10.8 K/uL 8.9 17.2 **Please note change in CBC/Diff reference range**(H) 10.6 **Please note change in CBC/Diff reference range**(H)  Hemoglobin 11.7 - 15.5 g/dL 13/10/2007 11.5(L) 14.4  Hematocrit 35.0 - 45.0 % 43.3 33.7(L) 41.8  Platelets 140 - 400 K/uL 197 267 DELTA CHECK NOTED 366   CMP Latest Ref Rng & Units 07/03/2017 09/27/2007  Glucose 65 - 99 mg/dL 99 98  BUN 7 - 25 mg/dL 16 8  Creatinine 09/29/2007 - 1.05 mg/dL 4.43 1.54  Sodium 0.08 - 146 mmol/L 142 142  Potassium 3.5 - 5.3 mmol/L 4.3 3.7  Chloride 98 - 110 mmol/L 100 103  CO2 20 - 32 mmol/L 26 28  Calcium 8.6 - 10.4 mg/dL 676 9.5  Total Protein 6.1 - 8.1 g/dL 7.2 -  Total Bilirubin 0.2 - 1.2 mg/dL 0.4 -  Alkaline Phos 33 - 130 U/L 131(H) -  AST 10 - 35 U/L 18 -  ALT 6 -  29 U/L 23 -    Imaging: Xr Shoulder Right  Result Date: 08/24/2017 No significant glenohumeral joint space narrowing was noted. Inferior spurring was noted. Some cystic changes were noted. No chondrocalcinosis was noted.   Speciality Comments: No specialty comments available.    Procedures:  Large Joint Inj Date/Time: 08/24/2017 9:22 AM Performed by: 08/26/2017 Authorized by: Pollyann Savoy   Consent Given by:  Patient Site marked: the procedure site was marked   Timeout: prior to procedure the correct patient, procedure, and site was verified   Indications:  Pain Location:  Shoulder Site:  R glenohumeral Prep: patient was prepped and draped in usual sterile fashion   Needle Size:  27 G Needle Length:  1.5 inches Approach:  Posterior Ultrasound Guidance: No   Fluoroscopic Guidance: No   Arthrogram: No   Medications:  1 mL lidocaine 1 %; 40 mg triamcinolone acetonide 40 MG/ML Aspiration Attempted: Yes   Aspirate amount (mL):  0 Patient tolerance:  Patient tolerated the procedure well with no immediate complications     Allergies:  Bupropion and Tape   Assessment / Plan:     Visit Diagnoses: Pain in both hands - All autoimmune w/u negative in the past. 06/2017 [place] bilateral hands did not show synovitis.I do not see any synovitis on examination . She continues to have some arthralgias. She does have some underlying osteoarthritis in her hands.  Chronic right shoulder pain - Plan: XR Shoulder Right. The x-ray showed some osteoarthritic changes present. Spurring but no glenohumeral joint space narrowing was noted. After informed consent was obtain the right shoulder was injected with cortisone as described above. She.with the procedure well. I've advised her if her symptoms persist she is to contact us. I will also refer her to physical therapy.  S/P total knee replacement, right: Chronic pain  Status post left partial knee replacement: Chronic pain  Primary  osteoarthritis of both feet: Chronic putting shoes were discussed.  DDD (degenerative disc disease), lumbar - Status post discectomy: She's been having increased lower back pain lately.  Fibromyalgia: She was in pain management and now she's having increased pain.  Other fatigue: Stage II insomnia  History of sleep apnea - On CPAP  Other chronic pain - Followed up by Dr. Laurian Brim. She is on Lyrica, oxycodone, Flexeril, Klonopin.  She has several other medical problems which are listed below:  History of anxiety  History of depression  History of gastroesophageal reflux (GERD)  Dyslipidemia  History of diverticulosis  History of cholecystectomy  Former smoker    Orders: Orders Placed This Encounter  Procedures  . Large Joint Injection/Arthrocentesis  . XR Shoulder Right  . Ambulatory referral to Physical Therapy   No orders of the defined types were placed in this encounter.   Face-to-face time spent with patient was 30 minutes. Greater than 50% of time was spent in counseling and coordination of care.  Follow-Up Instructions: Return in about 1 year (around 08/24/2018), or if symptoms worsen or fail to improve, for Osteoarthritis,DDD, FMS.   Pollyann Savoy, MD  Note - This record has been created using Animal nutritionist.  Chart creation errors have been sought, but may not always  have been located. Such creation errors do not reflect on  the standard of medical care.

## 2017-08-24 ENCOUNTER — Ambulatory Visit (INDEPENDENT_AMBULATORY_CARE_PROVIDER_SITE_OTHER): Payer: PPO | Admitting: Rheumatology

## 2017-08-24 ENCOUNTER — Encounter: Payer: Self-pay | Admitting: Rheumatology

## 2017-08-24 ENCOUNTER — Ambulatory Visit (INDEPENDENT_AMBULATORY_CARE_PROVIDER_SITE_OTHER): Payer: PPO

## 2017-08-24 VITALS — BP 136/84 | HR 95 | Ht 63.0 in | Wt 202.0 lb

## 2017-08-24 DIAGNOSIS — G8929 Other chronic pain: Secondary | ICD-10-CM | POA: Diagnosis not present

## 2017-08-24 DIAGNOSIS — Z87891 Personal history of nicotine dependence: Secondary | ICD-10-CM

## 2017-08-24 DIAGNOSIS — M25511 Pain in right shoulder: Secondary | ICD-10-CM

## 2017-08-24 DIAGNOSIS — M79641 Pain in right hand: Secondary | ICD-10-CM | POA: Diagnosis not present

## 2017-08-24 DIAGNOSIS — E785 Hyperlipidemia, unspecified: Secondary | ICD-10-CM

## 2017-08-24 DIAGNOSIS — M19071 Primary osteoarthritis, right ankle and foot: Secondary | ICD-10-CM

## 2017-08-24 DIAGNOSIS — M797 Fibromyalgia: Secondary | ICD-10-CM

## 2017-08-24 DIAGNOSIS — Z96651 Presence of right artificial knee joint: Secondary | ICD-10-CM

## 2017-08-24 DIAGNOSIS — Z96652 Presence of left artificial knee joint: Secondary | ICD-10-CM

## 2017-08-24 DIAGNOSIS — M79642 Pain in left hand: Secondary | ICD-10-CM

## 2017-08-24 DIAGNOSIS — Z8669 Personal history of other diseases of the nervous system and sense organs: Secondary | ICD-10-CM

## 2017-08-24 DIAGNOSIS — R5383 Other fatigue: Secondary | ICD-10-CM | POA: Diagnosis not present

## 2017-08-24 DIAGNOSIS — Z8719 Personal history of other diseases of the digestive system: Secondary | ICD-10-CM | POA: Diagnosis not present

## 2017-08-24 DIAGNOSIS — Z9049 Acquired absence of other specified parts of digestive tract: Secondary | ICD-10-CM

## 2017-08-24 DIAGNOSIS — M5136 Other intervertebral disc degeneration, lumbar region: Secondary | ICD-10-CM | POA: Diagnosis not present

## 2017-08-24 DIAGNOSIS — Z8659 Personal history of other mental and behavioral disorders: Secondary | ICD-10-CM

## 2017-08-24 DIAGNOSIS — M19072 Primary osteoarthritis, left ankle and foot: Secondary | ICD-10-CM

## 2017-08-24 MED ORDER — LIDOCAINE HCL 1 % IJ SOLN
1.0000 mL | INTRAMUSCULAR | Status: AC | PRN
  Administered 2017-08-24: 1 mL

## 2017-08-24 MED ORDER — TRIAMCINOLONE ACETONIDE 40 MG/ML IJ SUSP
40.0000 mg | INTRAMUSCULAR | Status: AC | PRN
  Administered 2017-08-24: 40 mg via INTRA_ARTICULAR

## 2017-08-24 NOTE — Patient Instructions (Signed)
Shoulder Exercises Ask your health care provider which exercises are safe for you. Do exercises exactly as told by your health care provider and adjust them as directed. It is normal to feel mild stretching, pulling, tightness, or discomfort as you do these exercises, but you should stop right away if you feel sudden pain or your pain gets worse.Do not begin these exercises until told by your health care provider. RANGE OF MOTION EXERCISES These exercises warm up your muscles and joints and improve the movement and flexibility of your shoulder. These exercises also help to relieve pain, numbness, and tingling. These exercises involve stretching your injured shoulder directly. Exercise A: Pendulum  1. Stand near a wall or a surface that you can hold onto for balance. 2. Bend at the waist and let your left / right arm hang straight down. Use your other arm to support you. Keep your back straight and do not lock your knees. 3. Relax your left / right arm and shoulder muscles, and move your hips and your trunk so your left / right arm swings freely. Your arm should swing because of the motion of your body, not because you are using your arm or shoulder muscles. 4. Keep moving your body so your arm swings in the following directions, as told by your health care provider: ? Side to side. ? Forward and backward. ? In clockwise and counterclockwise circles. 5. Continue each motion for __________ seconds, or for as long as told by your health care provider. 6. Slowly return to the starting position. Repeat __________ times. Complete this exercise __________ times a day. Exercise B:Flexion, Standing  1. Stand and hold a broomstick, a cane, or a similar object. Place your hands a little more than shoulder-width apart on the object. Your left / right hand should be palm-up, and your other hand should be palm-down. 2. Keep your elbow straight and keep your shoulder muscles relaxed. Push the stick down with  your healthy arm to raise your left / right arm in front of your body, and then over your head until you feel a stretch in your shoulder. ? Avoid shrugging your shoulder while you raise your arm. Keep your shoulder blade tucked down toward the middle of your back. 3. Hold for __________ seconds. 4. Slowly return to the starting position. Repeat __________ times. Complete this exercise __________ times a day. Exercise C: Abduction, Standing 1. Stand and hold a broomstick, a cane, or a similar object. Place your hands a little more than shoulder-width apart on the object. Your left / right hand should be palm-up, and your other hand should be palm-down. 2. While keeping your elbow straight and your shoulder muscles relaxed, push the stick across your body toward your left / right side. Raise your left / right arm to the side of your body and then over your head until you feel a stretch in your shoulder. ? Do not raise your arm above shoulder height, unless your health care provider tells you to do that. ? Avoid shrugging your shoulder while you raise your arm. Keep your shoulder blade tucked down toward the middle of your back. 3. Hold for __________ seconds. 4. Slowly return to the starting position. Repeat __________ times. Complete this exercise __________ times a day. Exercise D:Internal Rotation  1. Place your left / right hand behind your back, palm-up. 2. Use your other hand to dangle an exercise band, a towel, or a similar object over your shoulder. Grasp the band with   your left / right hand so you are holding onto both ends. 3. Gently pull up on the band until you feel a stretch in the front of your left / right shoulder. ? Avoid shrugging your shoulder while you raise your arm. Keep your shoulder blade tucked down toward the middle of your back. 4. Hold for __________ seconds. 5. Release the stretch by letting go of the band and lowering your hands. Repeat __________ times. Complete  this exercise __________ times a day. STRETCHING EXERCISES These exercises warm up your muscles and joints and improve the movement and flexibility of your shoulder. These exercises also help to relieve pain, numbness, and tingling. These exercises are done using your healthy shoulder to help stretch the muscles of your injured shoulder. Exercise E: Corner Stretch (External Rotation and Abduction)  1. Stand in a doorway with one of your feet slightly in front of the other. This is called a staggered stance. If you cannot reach your forearms to the door frame, stand facing a corner of a room. 2. Choose one of the following positions as told by your health care provider: ? Place your hands and forearms on the door frame above your head. ? Place your hands and forearms on the door frame at the height of your head. ? Place your hands on the door frame at the height of your elbows. 3. Slowly move your weight onto your front foot until you feel a stretch across your chest and in the front of your shoulders. Keep your head and chest upright and keep your abdominal muscles tight. 4. Hold for __________ seconds. 5. To release the stretch, shift your weight to your back foot. Repeat __________ times. Complete this stretch __________ times a day. Exercise F:Extension, Standing 1. Stand and hold a broomstick, a cane, or a similar object behind your back. ? Your hands should be a little wider than shoulder-width apart. ? Your palms should face away from your back. 2. Keeping your elbows straight and keeping your shoulder muscles relaxed, move the stick away from your body until you feel a stretch in your shoulder. ? Avoid shrugging your shoulders while you move the stick. Keep your shoulder blade tucked down toward the middle of your back. 3. Hold for __________ seconds. 4. Slowly return to the starting position. Repeat __________ times. Complete this exercise __________ times a day. STRENGTHENING  EXERCISES These exercises build strength and endurance in your shoulder. Endurance is the ability to use your muscles for a long time, even after they get tired. Exercise G:External Rotation  1. Sit in a stable chair without armrests. 2. Secure an exercise band at elbow height on your left / right side. 3. Place a soft object, such as a folded towel or a small pillow, between your left / right upper arm and your body to move your elbow a few inches away (about 10 cm) from your side. 4. Hold the end of the band so it is tight and there is no slack. 5. Keeping your elbow pressed against the soft object, move your left / right forearm out, away from your abdomen. Keep your body steady so only your forearm moves. 6. Hold for __________ seconds. 7. Slowly return to the starting position. Repeat __________ times. Complete this exercise __________ times a day. Exercise H:Shoulder Abduction  1. Sit in a stable chair without armrests, or stand. 2. Hold a __________ weight in your left / right hand, or hold an exercise band with both hands.   3. Start with your arms straight down and your left / right palm facing in, toward your body. 4. Slowly lift your left / right hand out to your side. Do not lift your hand above shoulder height unless your health care provider tells you that this is safe. ? Keep your arms straight. ? Avoid shrugging your shoulder while you do this movement. Keep your shoulder blade tucked down toward the middle of your back. 5. Hold for __________ seconds. 6. Slowly lower your arm, and return to the starting position. Repeat __________ times. Complete this exercise __________ times a day. Exercise I:Shoulder Extension 1. Sit in a stable chair without armrests, or stand. 2. Secure an exercise band to a stable object in front of you where it is at shoulder height. 3. Hold one end of the exercise band in each hand. Your palms should face each other. 4. Straighten your elbows and  lift your hands up to shoulder height. 5. Step back, away from the secured end of the exercise band, until the band is tight and there is no slack. 6. Squeeze your shoulder blades together as you pull your hands down to the sides of your thighs. Stop when your hands are straight down by your sides. Do not let your hands go behind your body. 7. Hold for __________ seconds. 8. Slowly return to the starting position. Repeat __________ times. Complete this exercise __________ times a day. Exercise J:Standing Shoulder Row 1. Sit in a stable chair without armrests, or stand. 2. Secure an exercise band to a stable object in front of you so it is at waist height. 3. Hold one end of the exercise band in each hand. Your palms should be in a thumbs-up position. 4. Bend each of your elbows to an "L" shape (about 90 degrees) and keep your upper arms at your sides. 5. Step back until the band is tight and there is no slack. 6. Slowly pull your elbows back behind you. 7. Hold for __________ seconds. 8. Slowly return to the starting position. Repeat __________ times. Complete this exercise __________ times a day. Exercise K:Shoulder Press-Ups  1. Sit in a stable chair that has armrests. Sit upright, with your feet flat on the floor. 2. Put your hands on the armrests so your elbows are bent and your fingers are pointing forward. Your hands should be about even with the sides of your body. 3. Push down on the armrests and use your arms to lift yourself off of the chair. Straighten your elbows and lift yourself up as much as you comfortably can. ? Move your shoulder blades down, and avoid letting your shoulders move up toward your ears. ? Keep your feet on the ground. As you get stronger, your feet should support less of your body weight as you lift yourself up. 4. Hold for __________ seconds. 5. Slowly lower yourself back into the chair. Repeat __________ times. Complete this exercise __________ times a  day. Exercise L: Wall Push-Ups  1. Stand so you are facing a stable wall. Your feet should be about one arm-length away from the wall. 2. Lean forward and place your palms on the wall at shoulder height. 3. Keep your feet flat on the floor as you bend your elbows and lean forward toward the wall. 4. Hold for __________ seconds. 5. Straighten your elbows to push yourself back to the starting position. Repeat __________ times. Complete this exercise __________ times a day. This information is not intended to replace advice   given to you by your health care provider. Make sure you discuss any questions you have with your health care provider. Document Released: 09/07/2005 Document Revised: 07/18/2016 Document Reviewed: 07/05/2015 [employer] Interactive Patient Education  2018 [employer]  

## 2017-09-01 DIAGNOSIS — R202 Paresthesia of skin: Secondary | ICD-10-CM | POA: Diagnosis not present

## 2017-09-01 DIAGNOSIS — M79601 Pain in right arm: Secondary | ICD-10-CM | POA: Diagnosis not present

## 2017-09-01 DIAGNOSIS — M25511 Pain in right shoulder: Secondary | ICD-10-CM | POA: Diagnosis not present

## 2017-09-01 DIAGNOSIS — M542 Cervicalgia: Secondary | ICD-10-CM | POA: Diagnosis not present

## 2017-09-01 DIAGNOSIS — M6281 Muscle weakness (generalized): Secondary | ICD-10-CM | POA: Diagnosis not present

## 2017-09-06 ENCOUNTER — Ambulatory Visit
Admission: RE | Admit: 2017-09-06 | Discharge: 2017-09-06 | Disposition: A | Discharge status: Home / Self Care | Payer: PPO | Source: Ambulatory Visit | Attending: Nurse Practitioner | Admitting: Nurse Practitioner

## 2017-09-06 ENCOUNTER — Other Ambulatory Visit
Admission: RE | Admit: 2017-09-06 | Discharge: 2017-09-06 | Disposition: A | Discharge status: Home / Self Care | Payer: PPO | Source: Ambulatory Visit | Attending: Nurse Practitioner | Admitting: Nurse Practitioner

## 2017-09-06 ENCOUNTER — Encounter: Payer: Self-pay | Admitting: Nurse Practitioner

## 2017-09-06 ENCOUNTER — Ambulatory Visit: Payer: PPO | Attending: Nurse Practitioner | Admitting: Nurse Practitioner

## 2017-09-06 DIAGNOSIS — Z789 Other specified health status: Secondary | ICD-10-CM | POA: Diagnosis not present

## 2017-09-06 DIAGNOSIS — M542 Cervicalgia: Secondary | ICD-10-CM | POA: Diagnosis not present

## 2017-09-06 DIAGNOSIS — M533 Sacrococcygeal disorders, not elsewhere classified: Secondary | ICD-10-CM

## 2017-09-06 DIAGNOSIS — M79601 Pain in right arm: Secondary | ICD-10-CM | POA: Diagnosis not present

## 2017-09-06 DIAGNOSIS — F329 Major depressive disorder, single episode, unspecified: Secondary | ICD-10-CM | POA: Insufficient documentation

## 2017-09-06 DIAGNOSIS — G894 Chronic pain syndrome: Secondary | ICD-10-CM | POA: Diagnosis not present

## 2017-09-06 DIAGNOSIS — Z79891 Long term (current) use of opiate analgesic: Secondary | ICD-10-CM

## 2017-09-06 DIAGNOSIS — M79605 Pain in left leg: Secondary | ICD-10-CM

## 2017-09-06 DIAGNOSIS — M791 Myalgia, unspecified site: Secondary | ICD-10-CM

## 2017-09-06 DIAGNOSIS — Z96653 Presence of artificial knee joint, bilateral: Secondary | ICD-10-CM | POA: Insufficient documentation

## 2017-09-06 DIAGNOSIS — Z981 Arthrodesis status: Secondary | ICD-10-CM | POA: Insufficient documentation

## 2017-09-06 DIAGNOSIS — M25561 Pain in right knee: Principal | ICD-10-CM

## 2017-09-06 DIAGNOSIS — E7800 Pure hypercholesterolemia, unspecified: Secondary | ICD-10-CM | POA: Diagnosis not present

## 2017-09-06 DIAGNOSIS — Z9889 Other specified postprocedural states: Secondary | ICD-10-CM | POA: Insufficient documentation

## 2017-09-06 DIAGNOSIS — M5441 Lumbago with sciatica, right side: Secondary | ICD-10-CM | POA: Insufficient documentation

## 2017-09-06 DIAGNOSIS — M25519 Pain in unspecified shoulder: Secondary | ICD-10-CM

## 2017-09-06 DIAGNOSIS — M47812 Spondylosis without myelopathy or radiculopathy, cervical region: Secondary | ICD-10-CM | POA: Diagnosis not present

## 2017-09-06 DIAGNOSIS — M5442 Lumbago with sciatica, left side: Secondary | ICD-10-CM

## 2017-09-06 DIAGNOSIS — E785 Hyperlipidemia, unspecified: Secondary | ICD-10-CM | POA: Insufficient documentation

## 2017-09-06 DIAGNOSIS — Z9049 Acquired absence of other specified parts of digestive tract: Secondary | ICD-10-CM | POA: Diagnosis not present

## 2017-09-06 DIAGNOSIS — Z7982 Long term (current) use of aspirin: Secondary | ICD-10-CM | POA: Diagnosis not present

## 2017-09-06 DIAGNOSIS — M79604 Pain in right leg: Secondary | ICD-10-CM

## 2017-09-06 DIAGNOSIS — M179 Osteoarthritis of knee, unspecified: Secondary | ICD-10-CM | POA: Diagnosis not present

## 2017-09-06 DIAGNOSIS — Z87891 Personal history of nicotine dependence: Secondary | ICD-10-CM | POA: Diagnosis not present

## 2017-09-06 DIAGNOSIS — Z79899 Other long term (current) drug therapy: Secondary | ICD-10-CM | POA: Diagnosis not present

## 2017-09-06 DIAGNOSIS — G8929 Other chronic pain: Secondary | ICD-10-CM | POA: Insufficient documentation

## 2017-09-06 DIAGNOSIS — Z91048 Other nonmedicinal substance allergy status: Secondary | ICD-10-CM | POA: Diagnosis not present

## 2017-09-06 DIAGNOSIS — M1710 Unilateral primary osteoarthritis, unspecified knee: Secondary | ICD-10-CM | POA: Diagnosis not present

## 2017-09-06 DIAGNOSIS — M25511 Pain in right shoulder: Secondary | ICD-10-CM | POA: Diagnosis not present

## 2017-09-06 DIAGNOSIS — Z888 Allergy status to other drugs, medicaments and biological substances status: Secondary | ICD-10-CM | POA: Diagnosis not present

## 2017-09-06 DIAGNOSIS — M549 Dorsalgia, unspecified: Secondary | ICD-10-CM

## 2017-09-06 DIAGNOSIS — Z471 Aftercare following joint replacement surgery: Secondary | ICD-10-CM | POA: Diagnosis not present

## 2017-09-06 DIAGNOSIS — K219 Gastro-esophageal reflux disease without esophagitis: Secondary | ICD-10-CM | POA: Diagnosis not present

## 2017-09-06 DIAGNOSIS — M899 Disorder of bone, unspecified: Secondary | ICD-10-CM

## 2017-09-06 DIAGNOSIS — Z96651 Presence of right artificial knee joint: Secondary | ICD-10-CM | POA: Diagnosis not present

## 2017-09-06 DIAGNOSIS — M79602 Pain in left arm: Secondary | ICD-10-CM

## 2017-09-06 DIAGNOSIS — M25562 Pain in left knee: Secondary | ICD-10-CM | POA: Diagnosis not present

## 2017-09-06 LAB — COMPREHENSIVE METABOLIC PANEL
ALBUMIN: 4.8 g/dL (ref 3.5–5.0)
ALK PHOS: 100 U/L (ref 38–126)
ALT: 31 U/L (ref 14–54)
AST: 26 U/L (ref 15–41)
Anion gap: 11 (ref 5–15)
BILIRUBIN TOTAL: 0.9 mg/dL (ref 0.3–1.2)
BUN: 16 mg/dL (ref 6–20)
CO2: 25 mmol/L (ref 22–32)
CREATININE: 0.67 mg/dL (ref 0.44–1.00)
Calcium: 10 mg/dL (ref 8.9–10.3)
Chloride: 104 mmol/L (ref 101–111)
GFR calc Af Amer: >60 mL/min (ref >60)
GLUCOSE: 83 mg/dL (ref 65–99)
Potassium: 4 mmol/L (ref 3.5–5.1)
Sodium: 140 mmol/L (ref 135–145)
TOTAL PROTEIN: 8.2 g/dL — AB (ref 6.5–8.1)

## 2017-09-06 LAB — C-REACTIVE PROTEIN: CRP: <0.8 mg/dL (ref <1.0)

## 2017-09-06 LAB — SEDIMENTATION RATE: Sed Rate: 18 mm/hr (ref 0–30)

## 2017-09-06 LAB — VITAMIN B12: Vitamin B-12: 475 pg/mL (ref 180–914)

## 2017-09-06 NOTE — Patient Instructions (Addendum)
____________________________________________________________________________________________  Appointment Policy Summary  It is our goal and responsibility to provide the medical community with assistance in the evaluation and management of patients with chronic pain. Unfortunately our resources are limited. Because we do not have an unlimited amount of time, or available appointments, we are required to closely monitor and manage their use. The following rules exist to maximize their use:  Patient's responsibilities: 1. Punctuality:  At what time should I arrive? You should be physically present in our office 30 minutes before your scheduled appointment. Your scheduled appointment is with your assigned healthcare provider. However, it takes 5-10 minutes to be "checked-in", and another 15 minutes for the nurses to do the admission. If you arrive to our office at the time you were given for your appointment, you will end up being at least 20-25 minutes late to your appointment with the provider. 2. Tardiness:  What happens if I arrive only a few minutes after my scheduled appointment time? You will need to reschedule your appointment. The cutoff is your appointment time. This is why it is so important that you arrive at least 30 minutes before that appointment. If you have an appointment scheduled for 10:00 AM and you arrive at 10:01, you will be required to reschedule your appointment.  3. Plan ahead:  Always assume that you will encounter traffic on your way in. Plan for it. If you are dependent on a driver, make sure they understand these rules and the need to arrive early. 4. Other appointments and responsibilities:  Avoid scheduling any other appointments before or after your pain clinic appointments.  5. Be prepared:  Write down everything that you need to discuss with your healthcare provider and give this information to the admitting nurse. Write down the medications that you will need  refilled. Bring your pills and bottles (even the empty ones), to all of your appointments, except for those where a procedure is scheduled. 6. No children or pets:  Find someone to take care of them. It is not appropriate to bring them in. 7. Scheduling changes:  We request "advanced notification" of any changes or cancellations. 8. Advanced notification:  Defined as a time period of more than 24 hours prior to the originally scheduled appointment. This allows for the appointment to be offered to other patients. 9. Rescheduling:  When a visit is rescheduled, it will require the cancellation of the original appointment. For this reason they both fall within the category of "Cancellations".  10. Cancellations:  They require advanced notification. Any cancellation less than 24 hours before the  appointment will be recorded as a "No Show". 11. No Show:  Defined as an unkept appointment where the patient failed to notify or declare to the practice their intention or inability to keep the appointment.  Corrective process for repeat offenders:  1. Tardiness: Three (3) episodes of rescheduling due to late arrivals will be recorded as one (1) "No Show". 2. Cancellation or reschedule: Three (3) cancellations or rescheduling will be recorded as one (1) "No Show". 3. "No Shows": Three (3) "No Shows" within a 12 month period will result in discharge from the practice.  ____________________________________________________________________________________________  ____________________________________________________________________________________________  Pain Scale  Introduction: The pain score used by this practice is the Verbal Numerical Rating Scale (VNRS-11). This is an 11-point scale. It is for adults and children 10 years or older. There are significant differences in how the pain score is reported, used, and applied. Forget everything you learned in the past and  learn this scoring  system.  General Information: The scale should reflect your current level of pain. Unless you are specifically asked for the level of your worst pain, or your average pain. If you are asked for one of these two, then it should be understood that it is over the past 24 hours.  Basic Activities of Daily Living (ADL): Personal hygiene, dressing, eating, transferring, and using restroom.  Instructions: Most patients tend to report their level of pain as a combination of two factors, their physical pain and their psychosocial pain. This last one is also known as "suffering" and it is reflection of how physical pain affects you socially and psychologically. From now on, report them separately. From this point on, when asked to report your pain level, report only your physical pain. Use the following table for reference.  Pain Clinic Pain Levels (0-5/10)  Pain Level Score  Description  No Pain 0   Mild pain 1 Nagging, annoying, but does not interfere with basic activities of daily living (ADL). Patients are able to eat, bathe, get dressed, toileting (being able to get on and off the toilet and perform personal hygiene functions), transfer (move in and out of bed or a chair without assistance), and maintain continence (able to control bladder and bowel functions). Blood pressure and heart rate are unaffected. A normal heart rate for a healthy adult ranges from 60 to 100 bpm (beats per minute).   Mild to moderate pain 2 Noticeable and distracting. Impossible to hide from other people. More frequent flare-ups. Still possible to adapt and function close to normal. It can be very annoying and may have occasional stronger flare-ups. With discipline, patients may get used to it and adapt.   Moderate pain 3 Interferes significantly with activities of daily living (ADL). It becomes difficult to feed, bathe, get dressed, get on and off the toilet or to perform personal hygiene functions. Difficult to get in and out of  bed or a chair without assistance. Very distracting. With effort, it can be ignored when deeply involved in activities.   Moderately severe pain 4 Impossible to ignore for more than a few minutes. With effort, patients may still be able to manage work or participate in some social activities. Very difficult to concentrate. Signs of autonomic nervous system discharge are evident: dilated pupils (mydriasis); mild sweating (diaphoresis); sleep interference. Heart rate becomes elevated (>115 bpm). Diastolic blood pressure (lower number) rises above 100 mmHg. Patients find relief in laying down and not moving.   Severe pain 5 Intense and extremely unpleasant. Associated with frowning face and frequent crying. Pain overwhelms the senses.  Ability to do any activity or maintain social relationships becomes significantly limited. Conversation becomes difficult. Pacing back and forth is common, as getting into a comfortable position is nearly impossible. Pain wakes you up from deep sleep. Physical signs will be obvious: pupillary dilation; increased sweating; goosebumps; brisk reflexes; cold, clammy hands and feet; nausea, vomiting or dry heaves; loss of appetite; significant sleep disturbance with inability to fall asleep or to remain asleep. When persistent, significant weight loss is observed due to the complete loss of appetite and sleep deprivation.  Blood pressure and heart rate becomes significantly elevated. Caution: If elevated blood pressure triggers a pounding headache associated with blurred vision, then the patient should immediately seek attention at an urgent or emergency care unit, as these may be signs of an impending stroke.    Emergency Department Pain Levels (6-10/10)  Emergency Room Pain 6   Severely limiting. Requires emergency care and should not be seen or managed at an outpatient pain management facility. Communication becomes difficult and requires great effort. Assistance to reach the  emergency department may be required. Facial flushing and profuse sweating along with potentially dangerous increases in heart rate and blood pressure will be evident.   Distressing pain 7 Self-care is very difficult. Assistance is required to transport, or use restroom. Assistance to reach the emergency department will be required. Tasks requiring coordination, such as bathing and getting dressed become very difficult.   Disabling pain 8 Self-care is no longer possible. At this level, pain is disabling. The individual is unable to do even the most "basic" activities such as walking, eating, bathing, dressing, transferring to a bed, or toileting. Fine motor skills are lost. It is difficult to think clearly.   Incapacitating pain 9 Pain becomes incapacitating. Thought processing is no longer possible. Difficult to remember your own name. Control of movement and coordination are lost.   The worst pain imaginable 10 At this level, most patients pass out from pain. When this level is reached, collapse of the autonomic nervous system occurs, leading to a sudden drop in blood pressure and heart rate. This in turn results in a temporary and dramatic drop in blood flow to the brain, leading to a loss of consciousness. Fainting is one of the body's self defense mechanisms. Passing out puts the brain in a calmed state and causes it to shut down for a while, in order to begin the healing process.    Summary: 1. Refer to this scale when providing [place] with your pain level. 2. Be accurate and careful when reporting your pain level. This will help with your care. 3. Over-reporting your pain level will lead to loss of credibility. 4. Even a level of 1/10 means that there is pain and will be treated at our facility. 5. High, inaccurate reporting will be documented as "Symptom Exaggeration", leading to loss of credibility and suspicions of possible secondary gains such as obtaining more narcotics, or wanting to appear  disabled, for fraudulent reasons. 6. Only pain levels of 5 or below will be seen at our facility. 7. Pain levels of 6 and above will be sent to the Emergency Department and the appointment cancelled. ____________________________________________________________________________________________   BMI interpretation table: BMI level Category Range association with higher incidence of chronic pain  <18 kg/m2 Underweight   18.5-24.9 kg/m2 Ideal body weight   25-29.9 kg/m2 Overweight Increased incidence by 20%  30-34.9 kg/m2 Obese (Class I) Increased incidence by 68%  35-39.9 kg/m2 Severe obesity (Class II) Increased incidence by 136%  >40 kg/m2 Extreme obesity (Class III) Increased incidence by 254%   BMI Readings from Last 4 Encounters:  09/06/17 35.43 kg/m  08/24/17 35.78 kg/m  07/03/17 35.43 kg/m   Wt Readings from Last 4 Encounters:  09/06/17 200 lb (90.7 kg)  08/24/17 202 lb (91.6 kg)  07/03/17 200 lb (90.7 kg)

## 2017-09-06 NOTE — Progress Notes (Signed)
Safety precautions to be maintained throughout the outpatient stay will include: orient to surroundings, keep bed in low position, maintain call bell within reach at all times, provide assistance with transfer out of bed and ambulation.  

## 2017-09-06 NOTE — Progress Notes (Signed)
Patient's Name: Jennifer Harmon  MRN: [id number]  Referring Provider: Glenda Chroman, MD  DOB: 1961-07-02  PCP: Glenda Chroman, MD  DOS: 09/06/2017  Note by: Dionisio David NP  Service setting: Ambulatory outpatient  Specialty: Interventional Pain Management  Location: [facility] (AMB) Pain Management Facility    Patient type: New Patient    Primary Reason(s) for Visit: Initial Patient Evaluation CC: Back Pain (top to bottom s/p lumbar fusion approx 6-8 years ago) and Knee Pain (bilateral, total joint replacement on left)  HPI  Jennifer Harmon is a 56 y.o. year old, female patient, who comes today for an initial evaluation. She has Dyslipidemia; History of gastroesophageal reflux (GERD); History of sleep apnea; History of depression; History of anxiety; Fibromyalgia; DDD (degenerative disc disease), lumbar; Status post left partial knee replacement; Status post total knee replacement, right; Primary osteoarthritis of both feet; Other chronic pain; History of diverticulosis; Former smoker; Chronic diarrhea; Chronic inflammatory arthritis; Aftercare following joint replacement; Arthritis of knee; Osteoarthritis of knee; Osteoarthritis of right knee; Symptomatic mammary hypertrophy; Chronic low back pain; Chronic upper back pain; Chronic neck pain; Chronic knee pain; Chronic shoulder pain; Chronic pain of lower extremity; Chronic upper extremity pain; Chronic pain syndrome; Long term current use of opiate analgesic; Disorder of bone, unspecified; Other long term (current) drug therapy; Other specified health status; Myalgia; and Chronic sacroiliac joint pain on her problem list.. Her primarily concern today is the Back Pain (top to bottom s/p lumbar fusion approx 6-8 years ago) and Knee Pain (bilateral, total joint replacement on left)  Pain Assessment: Location: Upper, Mid, Lower Back Radiating: back pain into hips and legs Onset: More than a month ago Duration: Chronic pain Quality: Spasm, Sharp, Cramping,  Discomfort, Constant Severity: 7 /10 (self-reported pain score)  Note: Reported level is compatible with observation. Clinically the patient looks like a 5/10       Information on the proper use of the pain scale provided to the patient today. When using our objective Pain Scale, levels between 6 and 10/10 are said to belong in an emergency room, as it progressively worsens from a 6/10, described as severely limiting, requiring emergency care not usually available at an outpatient pain management facility. At a 6/10 level, communication becomes difficult and requires great effort. Assistance to reach the emergency department may be required. Facial flushing and profuse sweating along with potentially dangerous increases in heart rate and blood pressure will be evident. Effect on ADL: paces self, small amount of activities at a time.  Timing: Constant (varies in intensity) Modifying factors: moist heat, ibuprofen but has hx of stomach ulcers  Onset and Duration: Present less than 3 months Cause of pain: Unknown Severity: NAS-11 at its worse: 8/10, NAS-11 at its best: 5/10, NAS-11 now: 6/10 and NAS-11 on the average: 6/10 Timing: Morning, Afternoon and Night Aggravating Factors: Bending, Climbing, Intercourse (sex), Kneeling, Lifiting, Prolonged standing, Squatting, Stooping , Twisting, Walking, Walking uphill and Walking downhill Alleviating Factors: Hot packs Associated Problems: Depression, Fatigue, Inability to control bowel, Numbness, Personality changes, Sadness, Spasms, Sweating, Swelling, Weakness, Pain that wakes patient up and Pain that does not allow patient to sleep Quality of Pain: Aching, Burning, Cramping, Distressing, Exhausting, Pressure-like and Sharp Previous Examinations or Tests: MRI scan and Nerve block Previous Treatments: Narcotic medications, Physical Therapy and Pool exercises  The patient comes into the clinics today for the first time for a chronic pain management  evaluation. According to the patient her primary area of pain is in  her middle back. She admits that the pain radiates around to the front of her torso. She denies any previous surgery. She has had interventional therapy by Dr Francesco Runner unsure of procedure performed. She admits that she is currently in physical therapy 2 days per week. She admits that she had a recent MRI at [facility].  Her second area of pain is in her lower back. She admits that the right side is greater than the left. The pain does go down into her buttocks. She is status post a lumbar spinal fusion by Dr. Vertell Limber in [place]. She has had previous interventional therapy by Dr. Francesco Runner approximately 2 years ago. She admits that they were effective but also had scar tissue was making injections more difficult to perform. She denies any recent physical therapy for her lower back. She did have MRI of lumbar spine.  Her next area of pain is in her neck. The right is greater than the left. She does have numbness tingling that goes down into her fingers. She denies any previous surgeries, interventional therapy. She is currently doing physical therapy for her neck.  Her fourth area of pain is in her knees. She admits that the right is greater than the left. She is status post right total knee replacement. She did have a left partial knee replacement. She denies any recent interventions,  physical therapy or images.  Her next area of pain is in her upper extremities. She admits that the right shoulder is worse. She admits that she has arthritis in multiple areas. She denies any previous surgeries, interventional therapy. She is currently in physical therapy. She did have images of her shoulder approximately 2 months. She has had previous NCS/EMG in [place]. 7 years ago.  Today I took the time to provide the patient with information regarding this pain practice. The patient was informed that the practice is divided into two  sections: an interventional pain management section, as well as a completely separate and distinct medication management section. I explained that there are procedure days for interventional therapies, and evaluation days for follow-ups and medication management. Because of the amount of documentation required during both, they are kept separated. This means that there is the possibility that she may be scheduled for a procedure on one day, and medication management the next. I have also informed her that because of staffing and facility limitations, this practice will no longer take patients for medication management only. To illustrate the reasons for this, I gave the patient the example of surgeons, and how inappropriate it would be to refer a patient to her care, just to write for the post-surgical antibiotics on a surgery done by a different surgeon.   Because interventional pain management is part of the board-certified specialty for the doctors, the patient was informed that joining this practice means that they are open to any and all interventional therapies. I made it clear that this does not mean that they will be forced to have any procedures done. What this means is that I believe interventional therapies to be essential part of the diagnosis and proper management of chronic pain conditions. Therefore, patients not interested in these interventional alternatives will be better served under the care of a different practitioner.  The patient was also made aware of my Comprehensive Pain Management Safety Guidelines where by joining this practice, they limit all of their nerve blocks and joint injections to those done by our practice, for as long as we  are retained to manage their care. Historic Controlled Substance Pharmacotherapy Review  PMP and historical list of controlled substances: Clonazepam 1 mg, oxycodone 15 mg, Lyrica 100 mg, Lyrica 75 mg, oxycodone/acetaminophen 7.5/325 mg,  hydrocodone/acetaminophen 7.5/325 mg, hydrocodone/acetaminophen 5 mg/325 mg,hydrocodone/acetaminophen 10/325 mg, oxycodone 20 mg, fentanyl 50 g patch Highest opioid analgesic regimen found: Oxycodone 20 mg 6 times daily (fill date 04/05/2012) oxycodone 120 mg per day Most recent opioid analgesic: Oxycodone 15 mg 1 tablet 4 times daily (fill date 07/27/2017) oxycodone 60 mg per day Current opioid analgesics: Oxycodone 15 mg 1 tablet 4 times daily (fill date 07/27/2017) oxycodone 60 mg per day Highest recorded MME/day: 180 mg/day MME/day: 90 mg/day Medications: The patient did not bring the medication(s) to the appointment, as requested in our "New Patient Package" Pharmacodynamics: Desired effects: Analgesia: The patient reports >50% benefit. Reported improvement in function: The patient reports medication allows her to accomplish basic ADLs. Clinically meaningful improvement in function (CMIF): Sustained CMIF goals met Perceived effectiveness: Described as relatively effective, allowing for increase in activities of daily living (ADL) Undesirable effects: Side-effects or Adverse reactions: None reported Historical Monitoring: The patient  reports that she does not use drugs. List of all UDS Test(s): No results found for: MDMA, COCAINSCRNUR, PCPSCRNUR, PCPQUANT, CANNABQUANT, THCU, [place] List of all Serum Drug Screening Test(s):  No results found for: AMPHSCRSER, BARBSCRSER, BENZOSCRSER, COCAINSCRSER, PCPSCRSER, PCPQUANT, THCSCRSER, CANNABQUANT, OPIATESCRSER, OXYSCRSER, PROPOXSCRSER Historical Background Evaluation: [place] PDMP: Six (6) year initial data search conducted.             [place] Department of public safety, offender search: Editor, commissioning Information) Non-contributory Risk Assessment Profile: Aberrant behavior: None observed or detected today Risk factors for fatal opioid overdose: concomitant use of Benzodiazepines and sleep apnea Fatal overdose hazard ratio (HR): Calculation  deferred Non-fatal overdose hazard ratio (HR): Calculation deferred Risk of opioid abuse or dependence: 0.7-3.0% with doses ? 36 MME/day and 6.1-26% with doses ? 120 MME/day. Substance use disorder (SUD) risk level: Pending results of Medical Psychology Evaluation for SUD Opioid risk tool (ORT) (Total Score): 8  ORT Scoring interpretation table:  Score <3 = Low Risk for SUD  Score between 4-7 = Moderate Risk for SUD  Score >8 = High Risk for Opioid Abuse   PHQ-2 Depression Scale:  Total score: 2  PHQ-2 Scoring interpretation table: (Score and probability of major depressive disorder)  Score 0 = No depression  Score 1 = 15.4% Probability  Score 2 = 21.1% Probability  Score 3 = 38.4% Probability  Score 4 = 45.5% Probability  Score 5 = 56.4% Probability  Score 6 = 78.6% Probability   PHQ-9 Depression Scale:  Total score: 6  PHQ-9 Scoring interpretation table:  Score 0-4 = No depression  Score 5-9 = Mild depression  Score 10-14 = Moderate depression  Score 15-19 = Moderately severe depression  Score 20-27 = Severe depression (2.4 times higher risk of SUD and 2.89 times higher risk of overuse)   Pharmacologic Plan: Pending ordered tests and/or consults  Meds  The patient has a current medication list which includes the following prescription(s): aspirin, bupropion, citalopram, clonazepam, cyclobenzaprine, diclofenac, famotidine, latanoprost, lyrica, oxycodone, pantoprazole, ranitidine, hydrocodone-acetaminophen, and simvastatin.  Current Outpatient Prescriptions on File Prior to Visit  Medication Sig  . aspirin 81 MG tablet Take 81 mg by mouth daily.    [name] buPROPion (WELLBUTRIN SR) 150 MG 12 hr tablet Take 150 mg by mouth daily.  . citalopram (CELEXA) 20 MG tablet Take 20 mg by mouth daily.    [name]  clonazePAM (KLONOPIN) 1 MG tablet Take 1 mg by mouth 3 (three) times daily as needed.    . cyclobenzaprine (FLEXERIL) 10 MG tablet Take 10 mg by mouth daily.    . diclofenac (VOLTAREN) 75  MG EC tablet Take 75 mg by mouth daily.    [name] latanoprost (XALATAN) 0.005 % ophthalmic solution Place 1 drop into both eyes daily.  [name] LYRICA 100 MG capsule Take 100 mg by mouth 3 (three) times daily.   [name] oxyCODONE (ROXICODONE) 15 MG immediate release tablet Take 15 mg by mouth every 4 (four) hours as needed for pain.  . pantoprazole (PROTONIX) 40 MG tablet Take 40 mg by mouth daily.    . ranitidine (ZANTAC) 150 MG tablet Take 150 mg by mouth daily.  [name] HYDROcodone-acetaminophen (NORCO) 7.5-325 MG per tablet Take 1 tablet by mouth every 8 (eight) hours as needed.    . simvastatin (ZOCOR) 20 MG tablet Take 20 mg by mouth at bedtime.     No current facility-administered medications on file prior to visit.    Imaging Review  Lumbosacral Imaging:  Lumbar DG 1V:  Results for orders placed during the hospital encounter of 09/21/07  DG Lumbar Spine 1 View   Narrative Clinical Data: L4-5/L5-S1 decompression and fusion.   PORTABLE LUMBAR SPINE - 1 VIEW:   Findings: A single view shows tissue spreaders in place posteriorly with probes directed at the L4, L5, and S1 pedicle levels.  IMPRESSION:   As discussed above.  Provider: Juline Patch, Cain Saupe   Results for orders placed during the hospital encounter of 09/21/07  DG Lumbar Spine 2-3 Views   Narrative Clinical Data:  Fusion L4-5 and L5-S1.  LUMBAR SPINE - 2 VIEW: Findings:  Two intraoperative views of lower lumbar spine are submitted for review after surgery. Pedicle screws at L3, L4, and L5 level with interbody spacer L3-4 and L4-5 level. IMPRESSION: Status post fusion L3-4 and L4-5.   Provider: Juline Patch, Cain Saupe    Note: Available results from prior imaging studies were reviewed.        ROS  Cardiovascular History: Needs antibiotics prior to dental procedures Pulmonary or Respiratory History: Snoring  and Temporary stoppage of breathing during sleep Neurological History:  Stroke (Residual deficits or weakness:  ) Review of Past Neurological Studies: No results found for this or any previous visit. Psychological-Psychiatric History: Anxiousness, Depressed and Prone to panicking Gastrointestinal History: Vomiting blood (Ulcers), Reflux or heatburn and Irregular, infrequent bowel movements (Constipation) Genitourinary History: Passing kidney stones and Peeing blood Hematological History: No reported hematological signs or symptoms such as prolonged bleeding, low or poor functioning platelets, bruising or bleeding easily, hereditary bleeding problems, low energy levels due to low hemoglobin or being anemic Endocrine History: No reported endocrine signs or symptoms such as high or low blood sugar, rapid heart rate due to high thyroid levels, obesity or weight gain due to slow thyroid or thyroid disease Rheumatologic History: Joint aches and or swelling due to excess weight (Osteoarthritis), Generalized muscle aches (Fibromyalgia) and Constant unexplained fatigue (Chronic Fatigue Syndrome) Musculoskeletal History: Negative for myasthenia gravis, muscular dystrophy, multiple sclerosis or malignant hyperthermia Work History: Disabled  Allergies  Jennifer Harmon is allergic to bupropion and tape.  Laboratory Chemistry  Inflammation Markers Lab Results  Component Value Date   ESRSEDRATE 5 07/03/2017   (CRP: Acute Phase) (ESR: Chronic Phase) Renal Function Markers Lab Results  Component Value Date   BUN 16 07/03/2017  CREATININE 0.69 07/03/2017   GFRAA >89 07/03/2017   GFRNONAA >89 07/03/2017   Hepatic Function Markers Lab Results  Component Value Date   AST 18 07/03/2017   ALT 23 07/03/2017   ALBUMIN 4.6 07/03/2017   ALKPHOS 131 (H) 07/03/2017   Electrolytes Lab Results  Component Value Date   NA 142 07/03/2017   K 4.3 07/03/2017   CL 100 07/03/2017   CALCIUM 10.2 07/03/2017   MG 2.0 07/03/2017   Neuropathy Markers No results found for:  [id number] Bone Pathology Markers Lab Results  Component Value Date   ALKPHOS 131 (H) 07/03/2017   CALCIUM 10.2 07/03/2017   Coagulation Parameters Lab Results  Component Value Date   PLT 197 07/03/2017   Cardiovascular Markers Lab Results  Component Value Date   HGB 14.0 07/03/2017   HCT 43.3 07/03/2017   Note: Lab results reviewed.  [place]  Drug: Jennifer Harmon  reports that she does not use drugs. Alcohol:  reports that she does not drink alcohol. Tobacco:  reports that she quit smoking about 21 months ago. Her smoking use included Cigarettes. She has a 10.00 pack-year smoking history. She uses smokeless tobacco. Medical:  has a past medical history of Anxiety; Chronic chest pain; Depression; DJD (degenerative joint disease); Fibromyalgia; GERD (gastroesophageal reflux disease); and Hypercholesterolemia. Family: family history includes Arthritis in her father; Hypertension in her mother.  Past Surgical History:  Procedure Laterality Date  . BACK SURGERY  2008  . CESAREAN SECTION    . CHOLECYSTECTOMY     Active Ambulatory Problems    Diagnosis Date Noted  . Dyslipidemia 06/30/2017  . History of gastroesophageal reflux (GERD) 06/30/2017  . History of sleep apnea 06/30/2017  . History of depression 06/30/2017  . History of anxiety 06/30/2017  . Fibromyalgia 06/30/2017  . DDD (degenerative disc disease), lumbar 06/30/2017  . Status post left partial knee replacement 06/30/2017  . Status post total knee replacement, right 06/30/2017  . Primary osteoarthritis of both feet 06/30/2017  . Other chronic pain 06/30/2017  . History of diverticulosis 06/30/2017  . Former smoker 06/30/2017  . Chronic diarrhea 06/30/2017  . Chronic inflammatory arthritis 07/24/2017  . Aftercare following joint replacement 12/27/2011  . Arthritis of knee 04/09/2012  . Osteoarthritis of knee 12/27/2011  . Osteoarthritis of right knee 03/14/2012  . Symptomatic mammary hypertrophy 09/19/2016  .  Chronic low back pain 09/06/2017  . Chronic upper back pain 09/06/2017  . Chronic neck pain 09/06/2017  . Chronic knee pain 09/06/2017  . Chronic shoulder pain 09/06/2017  . Chronic pain of lower extremity 09/06/2017  . Chronic upper extremity pain 09/06/2017  . Chronic pain syndrome 09/06/2017  . Long term current use of opiate analgesic 09/06/2017  . Disorder of bone, unspecified 09/06/2017  . Other long term (current) drug therapy 09/06/2017  . Other specified health status 09/06/2017  . Myalgia 09/06/2017  . Chronic sacroiliac joint pain 09/06/2017   Resolved Ambulatory Problems    Diagnosis Date Noted  . No Resolved Ambulatory Problems   Past Medical History:  Diagnosis Date  . Anxiety   . Chronic chest pain   . Depression   . DJD (degenerative joint disease)   . Fibromyalgia   . GERD (gastroesophageal reflux disease)   . Hypercholesterolemia    Constitutional Exam  General appearance: alert, cooperative, in moderate distress and overweight Vitals:   09/06/17 0813  BP: 136/75  Pulse: 89  Resp: 16  Temp: 97.7 F (36.5 C)  TempSrc: Oral  SpO2: 97%  Weight: 200 lb (90.7 kg)  Height: '5\' 3"'  (1.6 m)   BMI Assessment: Estimated body mass index is 35.43 kg/m as calculated from the following:   Height as of this encounter: '5\' 3"'  (1.6 m).   Weight as of this encounter: 200 lb (90.7 kg). Psych/Mental status: Alert, oriented x 3 (person, place, & time)       Eyes: PERLA Respiratory: No evidence of acute respiratory distress  Cervical Spine Exam  Inspection: No masses, redness, or swelling Alignment: Symmetrical Functional ROM: Decreased ROM     to right  Stability: No instability detected Muscle strength & Tone: Functionally intact Sensory: Unimpaired Palpation: Complains of area being tender to palpation              Upper Extremity (UE) Exam    Side: Right upper extremity  Side: Left upper extremity  Inspection: No masses, redness, swelling, or asymmetry. No  contractures  Inspection: No masses, redness, swelling, or asymmetry. No contractures  Functional ROM: Decreased ROM          Functional ROM: Decreased ROM          Muscle strength & Tone: Functionally intact  Muscle strength & Tone: Functionally intact  Sensory: Unimpaired  Sensory: Unimpaired  Palpation: Complains of area being tender to palpation              Palpation: Complains of area being tender to palpation              Specialized Test(s): Deferred         Specialized Test(s): Deferred          Thoracic Spine Exam  Inspection: No masses, redness, or swelling Alignment: Symmetrical Functional ROM: Unrestricted ROM Stability: No instability detected Sensory: Unimpaired Muscle strength & Tone: No palpable anomalies  Lumbar Spine Exam  Inspection: Well healed scar from previous spine surgery detected Alignment: Symmetrical Functional ROM: Decreased ROM      Stability: No instability detected Muscle strength & Tone: Increased muscle tone over affected area Sensory: Unimpaired Palpation: Complains of area being tender to palpation       Provocative Tests: Lumbar Hyperextension and rotation test: Unable to perform       Patrick's Maneuver: Positive for bilateral S-I arthralgia              Gait & Posture Assessment  Ambulation: Unassisted Gait: Relatively normal for age and body habitus Posture: Difficulty with positional changes secondary to large breast    Lower Extremity Exam    Side: Right lower extremity  Side: Left lower extremity  Inspection: No masses, redness, swelling, or asymmetry. No contractures  Inspection: No masses, redness, swelling, or asymmetry. No contractures  Functional ROM: Pain restricted ROM for knee joint  Functional ROM: Pain restricted ROM for knee joint  Muscle strength & Tone: Functionally intact  Muscle strength & Tone: Functionally intact  Sensory: Unimpaired  Sensory: Unimpaired  Palpation: Complains of area being tender to palpation   Palpation: Complains of area being tender to palpation lateral aspect   Assessment  Primary Diagnosis & Pertinent Problem List: Diagnoses of Chronic bilateral low back pain with bilateral sciatica, Chronic upper back pain, Chronic neck pain, Chronic pain of both knees, Chronic right shoulder pain, Chronic pain of both lower extremities, Chronic pain of both upper extremities, Chronic pain syndrome, Long term current use of opiate analgesic, Disorder of bone, unspecified, Other long term (current) drug therapy, Other specified health status, Myalgia, and Chronic sacroiliac joint pain were  pertinent to this visit.  Visit Diagnosis: 1. Chronic bilateral low back pain with bilateral sciatica   2. Chronic upper back pain   3. Chronic neck pain   4. Chronic pain of both knees   5. Chronic right shoulder pain   6. Chronic pain of both lower extremities   7. Chronic pain of both upper extremities   8. Chronic pain syndrome   9. Long term current use of opiate analgesic   10. Disorder of bone, unspecified   11. Other long term (current) drug therapy   12. Other specified health status   13. Myalgia   14. Chronic sacroiliac joint pain    Plan of Care  Initial treatment plan:  Please be advised that as per protocol, today's visit has been an evaluation only. We have not taken over the patient's controlled substance management.  Problem-specific plan: No problem-specific Assessment & Plan notes found for this encounter.  Ordered Lab-work, Procedure(s), Referral(s), & Consult(s): Orders Placed This Encounter  Procedures  . DG Knee 1-2 Views Left  . DG Knee 1-2 Views Right  . DG Si Joints  . DG Cervical Spine Complete  . Compliance Drug Analysis, Ur  . Comp. Metabolic Panel (12)  . C-reactive protein  . Sedimentation rate  . 25-Hydroxyvitamin D Lcms D2+D3  . Vitamin B12  . Ambulatory referral to Psychology   Pharmacotherapy: Medications ordered:  No orders of the defined types were  placed in this encounter.  Medications administered during this visit: Jennifer Harmon had no medications administered during this visit.   Pharmacotherapy under consideration:  Opioid Analgesics: The patient was informed that there is no guarantee that she would be a candidate for opioid analgesics. The decision will be made following CDC guidelines. This decision will be based on the results of diagnostic studies, as well as Jennifer Harmon's risk profile.  Membrane stabilizer: To be determined at a later time Muscle relaxant: To be determined at a later time NSAID: To be determined at a later time Other analgesic(s): To be determined at a later time   Interventional therapies under consideration: Jennifer Harmon was informed that there is no guarantee that she would be a candidate for interventional therapies. The decision will be based on the results of diagnostic studies, as well as Jennifer Harmon's risk profile.  Possible procedure(s): Diagnostic Trigger point injections Diagnostic bilateral LESI Diagnostic bilateral lumbar facet nerve block Possible bilateral lumbar facet RFA Possible bilateral Racz( Epidural Neurolysis) Diagnostic thoracic facet nerve block Possible thoracic facet RFA Diagnostic bilateral CESI Diagnostic bilateral cervical facet nerve block Possible bilateral cervical facet RFA Diagnostic right shoulder intra-articular injection Diagnostic right suprascapular nerve block Possible right suprascapular RFA Diagnostic bilateral genicular nerve block Possible bilateral genicular RFA    Provider-requested follow-up: Return for 2nd Visit, w/ Dr. Dossie Arbour, after [facility] eval.  Future Appointments Date Time Provider [place]  08/24/2018 9:15 AM Bo Merino, MD [facility] None    Primary Care Physician: Glenda Chroman, MD Location: [facility] Note by:  Date: 09/06/2017; Time: 11:44 AM  Pain Score Disclaimer: We use the NRS-11 scale.  This is a self-reported, subjective measurement of pain severity with only modest accuracy. It is used primarily to identify changes within a particular patient. It must be understood that outpatient pain scales are significantly less accurate that those used for research, where they can be applied under ideal controlled circumstances with minimal exposure to variables. In reality, the score is likely to be a combination  of pain intensity and pain affect, where pain affect describes the degree of emotional arousal or changes in action readiness caused by the sensory experience of pain. Factors such as social and work situation, setting, emotional state, anxiety levels, expectation, and prior pain experience may influence pain perception and show large inter-individual differences that may also be affected by time variables.  Patient instructions provided during this appointment: Patient Instructions    ____________________________________________________________________________________________  Appointment Policy Summary  It is our goal and responsibility to provide the medical community with assistance in the evaluation and management of patients with chronic pain. Unfortunately our resources are limited. Because we do not have an unlimited amount of time, or available appointments, we are required to closely monitor and manage their use. The following rules exist to maximize their use:  Patient's responsibilities: 1. Punctuality:  At what time should I arrive? You should be physically present in our office 30 minutes before your scheduled appointment. Your scheduled appointment is with your assigned healthcare provider. However, it takes 5-10 minutes to be "checked-in", and another 15 minutes for the nurses to do the admission. If you arrive to our office at the time you were given for your appointment, you will end up being at least 20-25 minutes late to your appointment with the  provider. 2. Tardiness:  What happens if I arrive only a few minutes after my scheduled appointment time? You will need to reschedule your appointment. The cutoff is your appointment time. This is why it is so important that you arrive at least 30 minutes before that appointment. If you have an appointment scheduled for 10:00 AM and you arrive at 10:01, you will be required to reschedule your appointment.  3. Plan ahead:  Always assume that you will encounter traffic on your way in. Plan for it. If you are dependent on a driver, make sure they understand these rules and the need to arrive early. 4. Other appointments and responsibilities:  Avoid scheduling any other appointments before or after your pain clinic appointments.  5. Be prepared:  Write down everything that you need to discuss with your healthcare provider and give this information to the admitting nurse. Write down the medications that you will need refilled. Bring your pills and bottles (even the empty ones), to all of your appointments, except for those where a procedure is scheduled. 6. No children or pets:  Find someone to take care of them. It is not appropriate to bring them in. 7. Scheduling changes:  We request "advanced notification" of any changes or cancellations. 8. Advanced notification:  Defined as a time period of more than 24 hours prior to the originally scheduled appointment. This allows for the appointment to be offered to other patients. 9. Rescheduling:  When a visit is rescheduled, it will require the cancellation of the original appointment. For this reason they both fall within the category of "Cancellations".  10. Cancellations:  They require advanced notification. Any cancellation less than 24 hours before the  appointment will be recorded as a "No Show". 11. No Show:  Defined as an unkept appointment where the patient failed to notify or declare to the practice their intention or inability to keep the  appointment.  Corrective process for repeat offenders:  1. Tardiness: Three (3) episodes of rescheduling due to late arrivals will be recorded as one (1) "No Show". 2. Cancellation or reschedule: Three (3) cancellations or rescheduling will be recorded as one (1) "No Show". 3. "No Shows": Three (3) "  No Shows" within a 12 month period will result in discharge from the practice.  ____________________________________________________________________________________________  ____________________________________________________________________________________________  Pain Scale  Introduction: The pain score used by this practice is the Verbal Numerical Rating Scale (VNRS-11). This is an 11-point scale. It is for adults and children 10 years or older. There are significant differences in how the pain score is reported, used, and applied. Forget everything you learned in the past and learn this scoring system.  General Information: The scale should reflect your current level of pain. Unless you are specifically asked for the level of your worst pain, or your average pain. If you are asked for one of these two, then it should be understood that it is over the past 24 hours.  Basic Activities of Daily Living (ADL): Personal hygiene, dressing, eating, transferring, and using restroom.  Instructions: Most patients tend to report their level of pain as a combination of two factors, their physical pain and their psychosocial pain. This last one is also known as "suffering" and it is reflection of how physical pain affects you socially and psychologically. From now on, report them separately. From this point on, when asked to report your pain level, report only your physical pain. Use the following table for reference.  Pain Clinic Pain Levels (0-5/10)  Pain Level Score  Description  No Pain 0   Mild pain 1 Nagging, annoying, but does not interfere with basic activities of daily living (ADL). Patients are  able to eat, bathe, get dressed, toileting (being able to get on and off the toilet and perform personal hygiene functions), transfer (move in and out of bed or a chair without assistance), and maintain continence (able to control bladder and bowel functions). Blood pressure and heart rate are unaffected. A normal heart rate for a healthy adult ranges from 60 to 100 bpm (beats per minute).   Mild to moderate pain 2 Noticeable and distracting. Impossible to hide from other people. More frequent flare-ups. Still possible to adapt and function close to normal. It can be very annoying and may have occasional stronger flare-ups. With discipline, patients may get used to it and adapt.   Moderate pain 3 Interferes significantly with activities of daily living (ADL). It becomes difficult to feed, bathe, get dressed, get on and off the toilet or to perform personal hygiene functions. Difficult to get in and out of bed or a chair without assistance. Very distracting. With effort, it can be ignored when deeply involved in activities.   Moderately severe pain 4 Impossible to ignore for more than a few minutes. With effort, patients may still be able to manage work or participate in some social activities. Very difficult to concentrate. Signs of autonomic nervous system discharge are evident: dilated pupils (mydriasis); mild sweating (diaphoresis); sleep interference. Heart rate becomes elevated (>115 bpm). Diastolic blood pressure (lower number) rises above 100 mmHg. Patients find relief in laying down and not moving.   Severe pain 5 Intense and extremely unpleasant. Associated with frowning face and frequent crying. Pain overwhelms the senses.  Ability to do any activity or maintain social relationships becomes significantly limited. Conversation becomes difficult. Pacing back and forth is common, as getting into a comfortable position is nearly impossible. Pain wakes you up from deep sleep. Physical signs will be  obvious: pupillary dilation; increased sweating; goosebumps; brisk reflexes; cold, clammy hands and feet; nausea, vomiting or dry heaves; loss of appetite; significant sleep disturbance with inability to fall asleep or to remain asleep. When  persistent, significant weight loss is observed due to the complete loss of appetite and sleep deprivation.  Blood pressure and heart rate becomes significantly elevated. Caution: If elevated blood pressure triggers a pounding headache associated with blurred vision, then the patient should immediately seek attention at an urgent or emergency care unit, as these may be signs of an impending stroke.    Emergency Department Pain Levels (6-10/10)  Emergency Room Pain 6 Severely limiting. Requires emergency care and should not be seen or managed at an outpatient pain management facility. Communication becomes difficult and requires great effort. Assistance to reach the emergency department may be required. Facial flushing and profuse sweating along with potentially dangerous increases in heart rate and blood pressure will be evident.   Distressing pain 7 Self-care is very difficult. Assistance is required to transport, or use restroom. Assistance to reach the emergency department will be required. Tasks requiring coordination, such as bathing and getting dressed become very difficult.   Disabling pain 8 Self-care is no longer possible. At this level, pain is disabling. The individual is unable to do even the most "basic" activities such as walking, eating, bathing, dressing, transferring to a bed, or toileting. Fine motor skills are lost. It is difficult to think clearly.   Incapacitating pain 9 Pain becomes incapacitating. Thought processing is no longer possible. Difficult to remember your own name. Control of movement and coordination are lost.   The worst pain imaginable 10 At this level, most patients pass out from pain. When this level is reached, collapse of the  autonomic nervous system occurs, leading to a sudden drop in blood pressure and heart rate. This in turn results in a temporary and dramatic drop in blood flow to the brain, leading to a loss of consciousness. Fainting is one of the body's self defense mechanisms. Passing out puts the brain in a calmed state and causes it to shut down for a while, in order to begin the healing process.    Summary: 1. Refer to this scale when providing [place] with your pain level. 2. Be accurate and careful when reporting your pain level. This will help with your care. 3. Over-reporting your pain level will lead to loss of credibility. 4. Even a level of 1/10 means that there is pain and will be treated at our facility. 5. High, inaccurate reporting will be documented as "Symptom Exaggeration", leading to loss of credibility and suspicions of possible secondary gains such as obtaining more narcotics, or wanting to appear disabled, for fraudulent reasons. 6. Only pain levels of 5 or below will be seen at our facility. 7. Pain levels of 6 and above will be sent to the Emergency Department and the appointment cancelled. ____________________________________________________________________________________________   BMI interpretation table: BMI level Category Range association with higher incidence of chronic pain  <18 kg/m2 Underweight   18.5-24.9 kg/m2 Ideal body weight   25-29.9 kg/m2 Overweight Increased incidence by 20%  30-34.9 kg/m2 Obese (Class I) Increased incidence by 68%  35-39.9 kg/m2 Severe obesity (Class II) Increased incidence by 136%  >40 kg/m2 Extreme obesity (Class III) Increased incidence by 254%   BMI Readings from Last 4 Encounters:  09/06/17 35.43 kg/m  08/24/17 35.78 kg/m  07/03/17 35.43 kg/m   Wt Readings from Last 4 Encounters:  09/06/17 200 lb (90.7 kg)  08/24/17 202 lb (91.6 kg)  07/03/17 200 lb (90.7 kg)

## 2017-09-06 NOTE — Progress Notes (Signed)
Results were reviewed and found to be: abnormal  No acute injury or pathology identified  Review would suggest interventional pain management techniques may be of benefit

## 2017-09-06 NOTE — Progress Notes (Signed)
Results were reviewed and found to be: mildly abnormal  No acute injury or pathology identified  Review would suggest interventional pain management techniques may be of benefit 

## 2017-09-08 DIAGNOSIS — M6281 Muscle weakness (generalized): Secondary | ICD-10-CM | POA: Diagnosis not present

## 2017-09-08 DIAGNOSIS — R202 Paresthesia of skin: Secondary | ICD-10-CM | POA: Diagnosis not present

## 2017-09-08 DIAGNOSIS — M25511 Pain in right shoulder: Secondary | ICD-10-CM | POA: Diagnosis not present

## 2017-09-08 DIAGNOSIS — M542 Cervicalgia: Secondary | ICD-10-CM | POA: Diagnosis not present

## 2017-09-08 DIAGNOSIS — M79601 Pain in right arm: Secondary | ICD-10-CM | POA: Diagnosis not present

## 2017-09-10 LAB — MISC LABCORP TEST (SEND OUT): LABCORP TEST CODE: 504115

## 2017-09-11 ENCOUNTER — Other Ambulatory Visit: Payer: Self-pay

## 2017-09-12 LAB — COMPLIANCE DRUG ANALYSIS, UR

## 2017-09-13 DIAGNOSIS — G4733 Obstructive sleep apnea (adult) (pediatric): Secondary | ICD-10-CM | POA: Diagnosis not present

## 2017-10-06 DIAGNOSIS — K219 Gastro-esophageal reflux disease without esophagitis: Secondary | ICD-10-CM | POA: Diagnosis not present

## 2017-10-06 DIAGNOSIS — Z6834 Body mass index (BMI) 34.0-34.9, adult: Secondary | ICD-10-CM | POA: Diagnosis not present

## 2017-10-06 DIAGNOSIS — I10 Essential (primary) hypertension: Secondary | ICD-10-CM | POA: Diagnosis not present

## 2017-10-06 DIAGNOSIS — Z299 Encounter for prophylactic measures, unspecified: Secondary | ICD-10-CM | POA: Diagnosis not present

## 2017-10-06 DIAGNOSIS — Z713 Dietary counseling and surveillance: Secondary | ICD-10-CM | POA: Diagnosis not present

## 2017-10-11 DIAGNOSIS — M25511 Pain in right shoulder: Secondary | ICD-10-CM | POA: Diagnosis not present

## 2017-10-11 DIAGNOSIS — M542 Cervicalgia: Secondary | ICD-10-CM | POA: Diagnosis not present

## 2017-10-11 DIAGNOSIS — M6281 Muscle weakness (generalized): Secondary | ICD-10-CM | POA: Diagnosis not present

## 2017-10-11 DIAGNOSIS — M79601 Pain in right arm: Secondary | ICD-10-CM | POA: Diagnosis not present

## 2017-10-11 DIAGNOSIS — R202 Paresthesia of skin: Secondary | ICD-10-CM | POA: Diagnosis not present

## 2017-10-11 DIAGNOSIS — M19011 Primary osteoarthritis, right shoulder: Secondary | ICD-10-CM | POA: Diagnosis not present

## 2017-10-13 DIAGNOSIS — G4733 Obstructive sleep apnea (adult) (pediatric): Secondary | ICD-10-CM | POA: Diagnosis not present

## 2017-10-18 ENCOUNTER — Other Ambulatory Visit: Payer: Self-pay | Admitting: Gastroenterology

## 2017-10-18 DIAGNOSIS — R1084 Generalized abdominal pain: Secondary | ICD-10-CM | POA: Diagnosis not present

## 2017-10-18 DIAGNOSIS — K219 Gastro-esophageal reflux disease without esophagitis: Secondary | ICD-10-CM | POA: Diagnosis not present

## 2017-10-20 ENCOUNTER — Ambulatory Visit
Admission: RE | Admit: 2017-10-20 | Discharge: 2017-10-20 | Disposition: A | Discharge status: Home / Self Care | Payer: PPO | Source: Ambulatory Visit | Attending: Gastroenterology | Admitting: Gastroenterology

## 2017-10-20 DIAGNOSIS — R1084 Generalized abdominal pain: Secondary | ICD-10-CM | POA: Diagnosis not present

## 2017-10-26 DIAGNOSIS — K3189 Other diseases of stomach and duodenum: Secondary | ICD-10-CM | POA: Diagnosis not present

## 2017-10-26 DIAGNOSIS — R12 Heartburn: Secondary | ICD-10-CM | POA: Diagnosis not present

## 2017-10-26 DIAGNOSIS — K2930 Chronic superficial gastritis without bleeding: Secondary | ICD-10-CM | POA: Diagnosis not present

## 2017-10-26 DIAGNOSIS — K317 Polyp of stomach and duodenum: Secondary | ICD-10-CM | POA: Diagnosis not present

## 2017-11-06 DIAGNOSIS — K317 Polyp of stomach and duodenum: Secondary | ICD-10-CM | POA: Diagnosis not present

## 2017-11-12 DIAGNOSIS — M25562 Pain in left knee: Secondary | ICD-10-CM

## 2017-11-12 DIAGNOSIS — M5442 Lumbago with sciatica, left side: Secondary | ICD-10-CM

## 2017-11-12 DIAGNOSIS — M25561 Pain in right knee: Secondary | ICD-10-CM

## 2017-11-12 DIAGNOSIS — M5441 Lumbago with sciatica, right side: Secondary | ICD-10-CM

## 2017-11-12 DIAGNOSIS — G8929 Other chronic pain: Secondary | ICD-10-CM | POA: Insufficient documentation

## 2017-11-12 NOTE — Progress Notes (Signed)
Patient's Name: Jennifer Harmon  MRN: [id number]  Referring Provider: Glenda Chroman, MD  DOB: May 29, 1961  PCP: Glenda Chroman, MD  DOS: 11/13/2017  Note by: Gaspar Cola, MD  Service setting: Ambulatory outpatient  Specialty: Interventional Pain Management  Location: [facility] (AMB) Pain Management Facility    Patient type: Established   Primary Reason(s) for Visit: Encounter for evaluation before starting new chronic pain management plan of care (Level of risk: moderate) CC: Back Pain (lower)  HPI  Jennifer Harmon is a 57 y.o. year old, female patient, who comes today for a follow-up evaluation to review the test results and decide on a treatment plan. She has Dyslipidemia; History of gastroesophageal reflux (GERD); History of sleep apnea; History of depression; History of anxiety; Fibromyalgia; DDD (degenerative disc disease), lumbar; Status post left partial knee replacement; Status post total knee replacement, right; Primary osteoarthritis of both feet; Other chronic pain; History of diverticulosis; Former smoker; Chronic diarrhea; Chronic inflammatory arthritis; Aftercare following joint replacement; Arthritis of knee; Osteoarthritis of knee; Osteoarthritis of right knee; Symptomatic mammary hypertrophy; Chronic upper back pain(Primary Area of Pain) (B)(R>L); Chronic neck pain (Tertiary Area of Pain) (B) (R>L); Chronic shoulder pain; Chronic pain of lower extremity; Chronic upper extremity pain; Chronic pain syndrome; Long term current use of opiate analgesic; Disorder of skeletal system; Other long term (current) drug therapy; Other specified health status; Myalgia; Chronic sacroiliac joint pain; Chronic low back pain  (Secondary Area of Pain) (B) (R>L); Chronic knee pain (Fourth Area of Pain) (Bilateral) (R>L); Long term prescription opiate use; NSAID long-term use; Opiate use; Pharmacologic therapy; Problems influencing health status; Chronic pain of right lower extremity (L5); Neurogenic pain; Chronic  musculoskeletal pain; Muscle spasms of both lower extremities; Long term prescription benzodiazepine use; Thoracic radiculopathy due to osteoarthritis of spine; and Thoracic radiculitis (Right) on their problem list. Her primarily concern today is the Back Pain (lower)  Pain Assessment: Location: Lower Back Radiating: buttocks bilateral, down right entire leg to thigh Onset: More than a month ago Duration: Chronic pain Quality: Aching, Constant, Discomfort, Shooting, Radiating Severity: 7 /10 (self-reported pain score)  Note: Reported level is inconsistent with clinical observations. Clinically the patient looks like a 2/10 A 2/10 is viewed as "Mild to Moderate" and described as noticeable and distracting. Impossible to hide from other people. More frequent flare-ups. Still possible to adapt and function close to normal. It can be very annoying and may have occasional stronger flare-ups. With discipline, patients may get used to it and adapt. Information on the proper use of the pain scale provided to the patient today. When using our objective Pain Scale, levels between 6 and 10/10 are said to belong in an emergency room, as it progressively worsens from a 6/10, described as severely limiting, requiring emergency care not usually available at an outpatient pain management facility. At a 6/10 level, communication becomes difficult and requires great effort. Assistance to reach the emergency department may be required. Facial flushing and profuse sweating along with potentially dangerous increases in heart rate and blood pressure will be evident. Effect on ADL: sitting a long time, cleaning, prolonged standing, driving and riding in car Timing: Constant Modifying factors: changing positions  Jennifer Harmon comes in today for a follow-up visit after her initial evaluation on 09/06/2017. Today we went over the results of her tests. These were explained in "Layman's terms". During today's appointment we went  over my diagnostic impression, as well as the proposed treatment plan.  According to the  patient her primary area of pain is in her middle back. She admits that the pain radiates around to the front of her torso. She denies any previous surgery. She has had interventional therapy by Dr. Francesco Runner unsure of procedure performed. She admits that she is currently in physical therapy 2 days per week. She admits that she had a recent MRI at [facility].  Her second area of pain is in her lower back. She admits that the right side is greater than the left. The pain does go down into her buttocks. She is s/p a lumbar spinal fusion by Dr. Vertell Limber in [facility] (2009-10). She has had previous interventional therapy by Rhoderick Moody, MD, approximately 2 years ago. She admits that they were effective but also had scar tissue was making injections more difficult to perform. She denies any recent physical therapy for her lower back. She did have MRI of lumbar spine.  Her next area of pain is in her neck. The right is greater than the left. She does have numbness tingling that goes down into her fingers (R) (4: index thru pinky). She denies any previous surgeries, interventional therapy. She is currently doing physical therapy for her neck.  Her fourth area of pain is in her knees. She admits that the right is greater than the left. She is s/p right total knee replacement (2013). She did have a partial knee replacement on the left (2010). She denies any recent interventions,  physical therapy or images.  Her next area of pain is in her upper extremities. She admits that the right shoulder is worse. She admits that she has arthritis in multiple areas. She denies any previous surgeries, interventional therapy. She is currently in physical therapy. She did have images of her shoulder approximately 2 months. She has had previous NCS/EMG in [place], [place] appr. 7 years ago.   In considering the treatment plan  options, Jennifer Harmon was reminded that I no longer take patients for medication management only. I asked her to let me know if she had no intention of taking advantage of the interventional therapies, so that we could make arrangements to provide this space to someone interested. I also made it clear that undergoing interventional therapies for the purpose of getting pain medications is very inappropriate on the part of a patient, and it will not be tolerated in this practice. This type of behavior would suggest true addiction and therefore it requires referral to an addiction specialist.   Further details on both, my assessment(s), as well as the proposed treatment plan, please see below.  Controlled Substance Pharmacotherapy Assessment REMS (Risk Evaluation and Mitigation Strategy)  Analgesic: Oxycodone 15 mg 1 tablet 4 times daily (fill date 07/27/2017) oxycodone 60 mg per day. Indicated taking 1/2 -1 BID. (30 mg/day of oxycodone) Highest recorded MME/day: 180 mg/day MME/day: 157 mg/day Pill Count: None expected due to no prior prescriptions written by our practice. No notes on file Pharmacokinetics: Liberation and absorption (onset of action): WNL Distribution (time to peak effect): WNL Metabolism and excretion (duration of action): WNL         Pharmacodynamics: Desired effects: Analgesia: Ms. Maenza reports >50% benefit. Functional ability: Patient reports that medication allows her to accomplish basic ADLs Clinically meaningful improvement in function (CMIF): Sustained CMIF goals met Perceived effectiveness: Described as relatively effective, allowing for increase in activities of daily living (ADL) Undesirable effects: Side-effects or Adverse reactions: None reported Monitoring: [place] PMP: Online review of the past 26-monthperiod previously  conducted. Not applicable at this point since we have not taken over the patient's medication management yet. List of other Serum/Urine Drug  Screening Test(s):  No results found for: AMPHSCRSER, BARBSCRSER, BENZOSCRSER, COCAINSCRSER, COCAINSCRNUR, [place], THCSCRSER, THCU, [place], [place], [place], [place], [place] List of all UDS test(s) done:  Lab Results  Component Value Date   SUMMARY FINAL 09/06/2017   Last UDS on record: Summary  Date Value Ref Range Status  09/06/2017 FINAL  Final    Comment:    ==================================================================== TOXASSURE COMP DRUG ANALYSIS,UR ==================================================================== Test                             Result       Flag       Units Drug Present and Declared for Prescription Verification   7-aminoclonazepam              238          EXPECTED   ng/mg creat    7-aminoclonazepam is an expected metabolite of clonazepam. Source    of clonazepam is a scheduled prescription medication.   Oxycodone                      2298         EXPECTED   ng/mg creat   Oxymorphone                    376          EXPECTED   ng/mg creat   Noroxycodone                   3913         EXPECTED   ng/mg creat   Noroxymorphone                 77           EXPECTED   ng/mg creat    Sources of oxycodone are scheduled prescription medications.    Oxymorphone, noroxycodone, and noroxymorphone are expected    metabolites of oxycodone. Oxymorphone is also available as a    scheduled prescription medication.   Pregabalin                     PRESENT      EXPECTED   Cyclobenzaprine                PRESENT      EXPECTED   Desmethylcyclobenzaprine       PRESENT      EXPECTED    Desmethylcyclobenzaprine is an expected metabolite of    cyclobenzaprine.   Bupropion                      PRESENT      EXPECTED   Hydroxybupropion               PRESENT      EXPECTED    Hydroxybupropion is an expected metabolite of bupropion.   Acetaminophen                  PRESENT      EXPECTED Drug Present not Declared for Prescription Verification   Diphenhydramine                 PRESENT      UNEXPECTED Drug Absent but Declared for Prescription Verification   Hydrocodone  Not Detected UNEXPECTED ng/mg creat   Citalopram                     Not Detected UNEXPECTED   Diclofenac                     Not Detected UNEXPECTED    Diclofenac, as indicated in the declared medication list, is not    always detected even when used as directed.   Salicylate                     Not Detected UNEXPECTED    Aspirin, as indicated in the declared medication list, is not    always detected even when used as directed. ==================================================================== Test                      Result    Flag   Units      Ref Range   Creatinine              127              mg/dL      >=20 ==================================================================== Declared Medications:  The flagging and interpretation on this report are based on the  following declared medications.  Unexpected results may arise from  inaccuracies in the declared medications.  **Note: The testing scope of this panel includes these medications:  Bupropion (Wellbutrin)  Citalopram (Celexa)  Clonazepam (Klonopin)  Cyclobenzaprine (Flexeril)  Hydrocodone (Norco)  Oxycodone  Pregabalin (Lyrica)  **Note: The testing scope of this panel does not include small to  moderate amounts of these reported medications:  Acetaminophen (Norco)  Aspirin (Aspirin 81)  Diclofenac (Voltaren)  **Note: The testing scope of this panel does not include following  reported medications:  Famotidine (Pepcid)  Latanoprost (Xalatan)  Pantoprazole  Ranitidine (Zantac)  Simvastatin (Zocor) ==================================================================== For clinical consultation, please call [phone]. ====================================================================    UDS interpretation: Unexpected findings not considered significantly abnormal.           Medication Assessment Form: Patient introduced to form today Treatment compliance: Treatment may start today if patient agrees with proposed plan. Evaluation of compliance is not applicable at this point Risk Assessment Profile: Aberrant behavior: See initial evaluations. None observed or detected today Comorbid factors increasing risk of overdose: See initial evaluation. No additional risks detected today Medical Psychology Evaluation: Please see scanned results in medical record. Opioid Risk Tool - 11/13/17 0949      Family History of Substance Abuse   Alcohol  Negative    Illegal Drugs  Negative    Rx Drugs  Negative      Personal History of Substance Abuse   Alcohol  Negative    Illegal Drugs  Negative    Rx Drugs  Negative      Psychological Disease   Psychological Disease  Positive    ADD  Negative    OCD  Negative    Bipolar  Negative    Schizophrenia  Positive    Depression  Negative      Total Score   Opioid Risk Tool Scoring  2    Opioid Risk Interpretation  Low Risk      ORT Scoring interpretation table:  Score <3 = Low Risk for SUD  Score between 4-7 = Moderate Risk for SUD  Score >8 = High Risk for Opioid Abuse   Risk Mitigation Strategies:  Patient opioid safety counseling: Completed today. Counseling  provided to patient as per "Patient Counseling Document". Document signed by patient, attesting to counseling and understanding Patient-Prescriber Agreement (PPA): Obtained today.  Controlled substance notification to other providers: Written and sent today.  Pharmacologic Plan: Today we may be taking over the patient's pharmacological regimen. See below.             Laboratory Chemistry  Inflammation Markers (CRP: Acute Phase) (ESR: Chronic Phase) Lab Results  Component Value Date   CRP <0.8 09/06/2017   ESRSEDRATE 18 09/06/2017                 Rheumatology Markers Lab Results  Component Value Date   RF <14 07/03/2017                Renal  Function Markers Lab Results  Component Value Date   BUN 16 09/06/2017   CREATININE 0.67 09/06/2017   GFRAA >60 09/06/2017   GFRNONAA >60 09/06/2017                 Hepatic Function Markers Lab Results  Component Value Date   AST 26 09/06/2017   ALT 31 09/06/2017   ALBUMIN 4.8 09/06/2017   ALKPHOS 100 09/06/2017                 Electrolytes Lab Results  Component Value Date   NA 140 09/06/2017   K 4.0 09/06/2017   CL 104 09/06/2017   CALCIUM 10.0 09/06/2017   MG 2.0 07/03/2017                 Neuropathy Markers Lab Results  Component Value Date   VITAMINB12 475 09/06/2017                 Coagulation Parameters Lab Results  Component Value Date   PLT 197 07/03/2017                 Cardiovascular Markers Lab Results  Component Value Date   CKTOTAL 130 07/03/2017   HGB 14.0 07/03/2017   HCT 43.3 07/03/2017                 Note: Lab results reviewed and made available to patient.  Recent Diagnostic Imaging Review  Cervical Imaging: Cervical DG complete:  Results for orders placed during the hospital encounter of 09/06/17  DG Cervical Spine Complete   Narrative CLINICAL DATA:  Cervicalgia, chronic  EXAM: CERVICAL SPINE - COMPLETE 4+ VIEW  COMPARISON:  None.  FINDINGS: Frontal, lateral, open-mouth odontoid, and bilateral oblique views were obtained. There is no fracture or spondylolisthesis. Prevertebral soft tissues and predental space regions are normal. There is moderately severe disc space narrowing at C5-6 and C6-7. There is slightly less pronounced disc space narrowing at C4-5 and C7-T1. There are prominent anterior osteophytes at C3, C4, C5, C6, and C7 with calcification in the anterior longitudinal ligament at C3-4, C4-5, C5-6, and C6-7. There is exit foraminal narrowing due to bony hypertrophy at C4-5, C5-6, and C6-7 bilaterally. Lung apices are clear. There is calcification in the left carotid artery.  IMPRESSION: Multilevel  osteoarthritic change. No fracture or spondylolisthesis. There is calcification in the left carotid artery.   Electronically Signed   By: Lowella Grip III M.D.   On: 09/06/2017 14:53    Thoracic Imaging: Thoracic MR wo contrast:  Results for orders placed in visit on 09/11/17  MR THORACIC SPINE WO CONTRAST   Lumbosacral Imaging: Lumbar DG 1V:  Results for orders placed during the hospital encounter  of 09/21/07  DG Lumbar Spine 1 View   Narrative Clinical Data: L4-5/L5-S1 decompression and fusion.   PORTABLE LUMBAR SPINE - 1 VIEW:   Findings: A single view shows tissue spreaders in place posteriorly with probes directed at the L4, L5, and S1 pedicle levels.  IMPRESSION:   As discussed above.  Provider: Juline Patch, Cain Saupe   Lumbar DG 2-3 views:  Results for orders placed during the hospital encounter of 09/21/07  DG Lumbar Spine 2-3 Views   Narrative Clinical Data:  Fusion L4-5 and L5-S1.  LUMBAR SPINE - 2 VIEW: Findings:  Two intraoperative views of lower lumbar spine are submitted for review after surgery. Pedicle screws at L3, L4, and L5 level with interbody spacer L3-4 and L4-5 level. IMPRESSION: Status post fusion L3-4 and L4-5.   Provider: Juline Patch, Cain Saupe   Sacroiliac Joint Imaging: Sacroiliac Joint DG:  Results for orders placed during the hospital encounter of 09/06/17  DG Si Joints   Narrative CLINICAL DATA:  Pain  EXAM: BILATERAL SACROILIAC JOINTS - 3+ VIEW  COMPARISON:  None.  FINDINGS: Frontal and bilateral oblique views were obtained. There is postoperative screw and plate fixation from F7-C9 bilaterally with disc spacers at L4-5 and L5-S1. There has been fusion in the lower lumbar spine.  There is no fracture or diastases. Sacroiliac joints appear symmetric bilaterally without arthropathy or sacroiliitis. Hip joints appear unremarkable bilaterally.  IMPRESSION: No fracture or  diastases. No sacroiliac joint arthropathy or sacroiliitis. Hip joints symmetric. Postoperative change lower lumbar spine and upper sacrum.   Electronically Signed   By: Lowella Grip III M.D.   On: 09/06/2017 14:49    Knee Imaging: Knee-R DG 1-2 views:  Results for orders placed during the hospital encounter of 09/06/17  DG Knee 1-2 Views Right   Narrative CLINICAL DATA:  History of right knee joint replacement. Chronic right knee pain.  EXAM: RIGHT KNEE - 1-2 VIEW  COMPARISON:  None in PACs  FINDINGS: The total knee joint prosthesis appears appropriately positioned. The interface with the native bone appears normal. No acute native bone abnormality is observed. The soft tissues are unremarkable.  IMPRESSION: There is no acute or significant chronic abnormality of the prosthetic right knee nor of the native bone.   Electronically Signed   By: David  Martinique M.D.   On: 09/06/2017 14:50    Knee-L DG 1-2 views:  Results for orders placed during the hospital encounter of 09/06/17  DG Knee 1-2 Views Left   Narrative CLINICAL DATA:  Knee pain, initial encounter  EXAM: LEFT KNEE - 1-2 VIEW  COMPARISON:  None.  FINDINGS: Partial medial knee replacement is noted. No loosening is seen. No joint effusion is noted. Persistent spurring is noted along the medial aspect of femur and tibia. Mild patellofemoral degenerative changes are noted as well.  IMPRESSION: Postsurgical and degenerative changes without acute abnormality.   Electronically Signed   By: Inez Catalina M.D.   On: 09/06/2017 14:54    Complexity Note: Imaging results reviewed. Results shared with Ms. Zemaitis, using Layman's terms.                        Meds   Current Outpatient Medications:  .  aspirin 81 MG tablet, Take 81 mg by mouth daily.  , Disp: , Rfl:  .  buPROPion (WELLBUTRIN SR) 150 MG 12 hr tablet, Take 150 mg by mouth daily., Disp: ,  Rfl:  .  citalopram (CELEXA) 20 MG tablet, Take 20  mg by mouth daily.  , Disp: , Rfl:  .  clonazePAM (KLONOPIN) 1 MG tablet, Take 1 mg by mouth 3 (three) times daily as needed.  , Disp: , Rfl:  .  cyclobenzaprine (FLEXERIL) 10 MG tablet, Take 1 tablet (10 mg total) by mouth at bedtime., Disp: 30 tablet, Rfl: 0 .  ibuprofen (ADVIL,MOTRIN) 200 MG tablet, Take 400 mg by mouth every 6 (six) hours as needed., Disp: , Rfl:  .  latanoprost (XALATAN) 0.005 % ophthalmic solution, Place 1 drop into both eyes daily., Disp: , Rfl:  .  LYRICA 100 MG capsule, Take 1 capsule (100 mg total) by mouth 2 (two) times daily., Disp: 60 capsule, Rfl: 0 .  oxyCODONE (OXY IR/ROXICODONE) 5 MG immediate release tablet, Take 1 tablet (5 mg total) by mouth every 8 (eight) hours as needed for severe pain., Disp: 90 tablet, Rfl: 0 .  pantoprazole (PROTONIX) 40 MG tablet, Take 40 mg by mouth daily.  , Disp: , Rfl:  .  ranitidine (ZANTAC) 150 MG tablet, Take 150 mg by mouth daily., Disp: , Rfl:   ROS  Constitutional: Denies any fever or chills Gastrointestinal: No reported hemesis, hematochezia, vomiting, or acute GI distress Musculoskeletal: Denies any acute onset joint swelling, redness, loss of ROM, or weakness Neurological: No reported episodes of acute onset apraxia, aphasia, dysarthria, agnosia, amnesia, paralysis, loss of coordination, or loss of consciousness  Allergies  Ms. Mally is allergic to bupropion and tape.  Cecilia  Drug: Ms. Blish  reports that she does not use drugs. Alcohol:  reports that she does not drink alcohol. Tobacco:  reports that she quit smoking about 2 years ago. Her smoking use included cigarettes. She has a 10.00 pack-year smoking history. She uses smokeless tobacco. Medical:  has a past medical history of Anxiety, Chronic chest pain, Depression, DJD (degenerative joint disease), Fibromyalgia, GERD (gastroesophageal reflux disease), and Hypercholesterolemia. Surgical: Ms. Buffone  has a past surgical history that includes Cholecystectomy;  Cesarean section; and Back surgery (2008). Family: family history includes Arthritis in her father; Hypertension in her mother.  Constitutional Exam  General appearance: Well nourished, well developed, and well hydrated. In no apparent acute distress Vitals:   11/13/17 0934  BP: 133/83  Pulse: 87  Resp: 16  Temp: 98 F (36.7 C)  SpO2: 99%  Weight: 200 lb (90.7 kg)  Height: '5\' 3"'  (1.6 m)   BMI Assessment: Estimated body mass index is 35.43 kg/m as calculated from the following:   Height as of this encounter: '5\' 3"'  (1.6 m).   Weight as of this encounter: 200 lb (90.7 kg).  BMI interpretation table: BMI level Category Range association with higher incidence of chronic pain  <18 kg/m2 Underweight   18.5-24.9 kg/m2 Ideal body weight   25-29.9 kg/m2 Overweight Increased incidence by 20%  30-34.9 kg/m2 Obese (Class I) Increased incidence by 68%  35-39.9 kg/m2 Severe obesity (Class II) Increased incidence by 136%  >40 kg/m2 Extreme obesity (Class III) Increased incidence by 254%   BMI Readings from Last 4 Encounters:  11/13/17 35.43 kg/m  09/06/17 35.43 kg/m  08/24/17 35.78 kg/m  07/03/17 35.43 kg/m   Wt Readings from Last 4 Encounters:  11/13/17 200 lb (90.7 kg)  09/06/17 200 lb (90.7 kg)  08/24/17 202 lb (91.6 kg)  07/03/17 200 lb (90.7 kg)  Psych/Mental status: Alert, oriented x 3 (person, place, & time)       Eyes:  PERLA Respiratory: No evidence of acute respiratory distress  Cervical Spine Area Exam  Skin & Axial Inspection: No masses, redness, edema, swelling, or associated skin lesions Alignment: Symmetrical Functional ROM: Unrestricted ROM      Stability: No instability detected Muscle Tone/Strength: Functionally intact. No obvious neuro-muscular anomalies detected. Sensory (Neurological): Unimpaired Palpation: No palpable anomalies              Upper Extremity (UE) Exam    Side: Right upper extremity  Side: Left upper extremity  Skin & Extremity  Inspection: Skin color, temperature, and hair growth are WNL. No peripheral edema or cyanosis. No masses, redness, swelling, asymmetry, or associated skin lesions. No contractures.  Skin & Extremity Inspection: Skin color, temperature, and hair growth are WNL. No peripheral edema or cyanosis. No masses, redness, swelling, asymmetry, or associated skin lesions. No contractures.  Functional ROM: Unrestricted ROM          Functional ROM: Unrestricted ROM          Muscle Tone/Strength: Functionally intact. No obvious neuro-muscular anomalies detected.  Muscle Tone/Strength: Functionally intact. No obvious neuro-muscular anomalies detected.  Sensory (Neurological): Unimpaired          Sensory (Neurological): Unimpaired          Palpation: No palpable anomalies              Palpation: No palpable anomalies              Specialized Test(s): Deferred         Specialized Test(s): Deferred          Thoracic Spine Area Exam  Skin & Axial Inspection: No masses, redness, or swelling Alignment: Symmetrical Functional ROM: Unrestricted ROM Stability: No instability detected Muscle Tone/Strength: Functionally intact. No obvious neuro-muscular anomalies detected. Sensory (Neurological): Unimpaired Muscle strength & Tone: No palpable anomalies  Lumbar Spine Area Exam  Skin & Axial Inspection: No masses, redness, or swelling Alignment: Symmetrical Functional ROM: Unrestricted ROM      Stability: No instability detected Muscle Tone/Strength: Functionally intact. No obvious neuro-muscular anomalies detected. Sensory (Neurological): Unimpaired Palpation: No palpable anomalies       Provocative Tests: Lumbar Hyperextension and rotation test: evaluation deferred today       Lumbar Lateral bending test: evaluation deferred today       Patrick's Maneuver: evaluation deferred today                    Gait & Posture Assessment  Ambulation: Unassisted Gait: Relatively normal for age and body habitus Posture: WNL    Lower Extremity Exam    Side: Right lower extremity  Side: Left lower extremity  Skin & Extremity Inspection: Skin color, temperature, and hair growth are WNL. No peripheral edema or cyanosis. No masses, redness, swelling, asymmetry, or associated skin lesions. No contractures.  Skin & Extremity Inspection: Skin color, temperature, and hair growth are WNL. No peripheral edema or cyanosis. No masses, redness, swelling, asymmetry, or associated skin lesions. No contractures.  Functional ROM: Unrestricted ROM          Functional ROM: Unrestricted ROM          Muscle Tone/Strength: Functionally intact. No obvious neuro-muscular anomalies detected.  Muscle Tone/Strength: Functionally intact. No obvious neuro-muscular anomalies detected.  Sensory (Neurological): Unimpaired  Sensory (Neurological): Unimpaired  Palpation: No palpable anomalies  Palpation: No palpable anomalies   Assessment & Plan  Primary Diagnosis & Pertinent Problem List: The primary encounter diagnosis was Chronic upper back pain(Primary  Area of Pain) (B)(R>L). Diagnoses of Chronic low back pain  (Secondary Area of Pain) (B) (R>L), Chronic neck pain (Tertiary Area of Pain) (B) (R>L), Chronic knee pain (Fourth Area of Pain) (Bilateral) (R>L), Long term prescription opiate use, NSAID long-term use, Opiate use, Pharmacologic therapy, Problems influencing health status, Chronic pain of right lower extremity (L5), Neurogenic pain, Fibromyalgia, Chronic pain syndrome, Chronic musculoskeletal pain, Muscle spasms of both lower extremities, Long term prescription benzodiazepine use, Thoracic radiculopathy due to osteoarthritis of spine, and Thoracic radiculitis (Right) were also pertinent to this visit.  Visit Diagnosis: 1. Chronic upper back pain(Primary Area of Pain) (B)(R>L)   2. Chronic low back pain  (Secondary Area of Pain) (B) (R>L)   3. Chronic neck pain (Tertiary Area of Pain) (B) (R>L)   4. Chronic knee pain (Fourth Area of Pain)  (Bilateral) (R>L)   5. Long term prescription opiate use   6. NSAID long-term use   7. Opiate use   8. Pharmacologic therapy   9. Problems influencing health status   10. Chronic pain of right lower extremity (L5)   11. Neurogenic pain   12. Fibromyalgia   13. Chronic pain syndrome   14. Chronic musculoskeletal pain   15. Muscle spasms of both lower extremities   16. Long term prescription benzodiazepine use   17. Thoracic radiculopathy due to osteoarthritis of spine   18. Thoracic radiculitis (Right)    Problems updated and reviewed during this visit: Problem  Chronic pain of right lower extremity (L5)  Neurogenic Pain  Chronic Musculoskeletal Pain  Muscle Spasms of Both Lower Extremities  Thoracic Radiculopathy Due to Osteoarthritis of Spine  Thoracic radiculitis (Right)  Fibromyalgia  Long Term Prescription Opiate Use  Nsaid Long-Term Use  Opiate Use  Pharmacologic Therapy  Problems Influencing Health Status  Long Term Prescription Benzodiazepine Use  Disorder of Skeletal System    Plan of Care  Pharmacotherapy (Medications Ordered): Meds ordered this encounter  Medications  . LYRICA 100 MG capsule    Sig: Take 1 capsule (100 mg total) by mouth 2 (two) times daily.    Dispense:  60 capsule    Refill:  0    Do not place medication on "Automatic Refill". Fill one day early if pharmacy is closed on scheduled refill date.  [name] oxyCODONE (OXY IR/ROXICODONE) 5 MG immediate release tablet    Sig: Take 1 tablet (5 mg total) by mouth every 8 (eight) hours as needed for severe pain.    Dispense:  90 tablet    Refill:  0    Do not place this medication, or any other prescription from our practice, on "Automatic Refill". Patient may have prescription filled one day early if pharmacy is closed on scheduled refill date. Do not fill until: 11/13/2017 To last until: 12/13/2017  . cyclobenzaprine (FLEXERIL) 10 MG tablet    Sig: Take 1 tablet (10 mg total) by mouth at bedtime.     Dispense:  30 tablet    Refill:  0    Do not place medication on "Automatic Refill". Fill one day early if pharmacy is closed on scheduled refill date.    Procedure Orders     Thoracic Epidural Injection Lab Orders  No laboratory test(s) ordered today    Imaging Orders     DG Thoracic Spine 2 View  Referral Orders     Ambulatory referral to Psychiatry  Pharmacological management options:  Opioid Analgesics: We'll take over management today. See above orders Membrane stabilizer: We  have discussed the possibility of optimizing this mode of therapy, if tolerated Muscle relaxant: We have discussed the possibility of a trial NSAID: We have discussed the possibility of a trial Other analgesic(s): To be determined at a later time   Interventional management options: Planned, scheduled, and/or pending:    Diagnostic right sided thoracic epidural steroid injection under fluoroscopic guidance.   Considering:   Diagnostic Trigger point injections Diagnostic bilateral LESI Diagnostic bilateral lumbar facet nerve block Possible bilateral lumbar facet RFA Possible bilateral Racz( Epidural Neurolysis) Diagnostic thoracic facet nerve block Possible thoracic facet RFA Diagnostic bilateral CESI Diagnostic bilateral cervical facet nerve block Possible bilateral cervical facet RFA Diagnostic right shoulder intra-articular injection Diagnostic right suprascapular nerve block Possible right suprascapular RFA Diagnostic bilateral genicular nerve block Possible bilateral genicular RFA    PRN Procedures:   None at this time   Provider-requested follow-up: Return for Procedure (w/ sedation): (R) TESI.  Future Appointments  Date Time Provider [place]  11/21/2017  8:15 AM Milinda Pointer, MD [facility] None  08/24/2018  9:15 AM Bo Merino, MD [facility] None    Primary Care Physician: Glenda Chroman, MD Location: [facility] Note by: Gaspar Cola, MD Date: 11/13/2017; Time: 12:52 PM

## 2017-11-13 ENCOUNTER — Ambulatory Visit
Admission: RE | Admit: 2017-11-13 | Discharge: 2017-11-13 | Disposition: A | Discharge status: Home / Self Care | Payer: PPO | Source: Ambulatory Visit | Attending: Pain Medicine | Admitting: Pain Medicine

## 2017-11-13 ENCOUNTER — Other Ambulatory Visit: Payer: Self-pay

## 2017-11-13 ENCOUNTER — Encounter: Payer: Self-pay | Admitting: Pain Medicine

## 2017-11-13 ENCOUNTER — Ambulatory Visit: Payer: PPO | Attending: Pain Medicine | Admitting: Pain Medicine

## 2017-11-13 VITALS — BP 133/83 | HR 87 | Temp 98.0°F | Resp 16 | Ht 63.0 in | Wt 200.0 lb

## 2017-11-13 DIAGNOSIS — Z789 Other specified health status: Secondary | ICD-10-CM

## 2017-11-13 DIAGNOSIS — M5414 Radiculopathy, thoracic region: Secondary | ICD-10-CM

## 2017-11-13 DIAGNOSIS — M542 Cervicalgia: Secondary | ICD-10-CM | POA: Diagnosis not present

## 2017-11-13 DIAGNOSIS — M170 Bilateral primary osteoarthritis of knee: Secondary | ICD-10-CM | POA: Diagnosis not present

## 2017-11-13 DIAGNOSIS — G8929 Other chronic pain: Secondary | ICD-10-CM

## 2017-11-13 DIAGNOSIS — M7918 Myalgia, other site: Secondary | ICD-10-CM | POA: Insufficient documentation

## 2017-11-13 DIAGNOSIS — M545 Low back pain: Secondary | ICD-10-CM | POA: Diagnosis not present

## 2017-11-13 DIAGNOSIS — M5136 Other intervertebral disc degeneration, lumbar region: Secondary | ICD-10-CM | POA: Diagnosis not present

## 2017-11-13 DIAGNOSIS — M797 Fibromyalgia: Secondary | ICD-10-CM | POA: Diagnosis not present

## 2017-11-13 DIAGNOSIS — M4724 Other spondylosis with radiculopathy, thoracic region: Secondary | ICD-10-CM

## 2017-11-13 DIAGNOSIS — F1190 Opioid use, unspecified, uncomplicated: Secondary | ICD-10-CM

## 2017-11-13 DIAGNOSIS — Z96653 Presence of artificial knee joint, bilateral: Secondary | ICD-10-CM | POA: Diagnosis not present

## 2017-11-13 DIAGNOSIS — M4814 Ankylosing hyperostosis [Forestier], thoracic region: Secondary | ICD-10-CM | POA: Insufficient documentation

## 2017-11-13 DIAGNOSIS — M79602 Pain in left arm: Secondary | ICD-10-CM | POA: Insufficient documentation

## 2017-11-13 DIAGNOSIS — Z79899 Other long term (current) drug therapy: Secondary | ICD-10-CM | POA: Diagnosis not present

## 2017-11-13 DIAGNOSIS — M546 Pain in thoracic spine: Secondary | ICD-10-CM | POA: Diagnosis not present

## 2017-11-13 DIAGNOSIS — M79604 Pain in right leg: Secondary | ICD-10-CM | POA: Diagnosis not present

## 2017-11-13 DIAGNOSIS — G894 Chronic pain syndrome: Secondary | ICD-10-CM | POA: Diagnosis not present

## 2017-11-13 DIAGNOSIS — M549 Dorsalgia, unspecified: Principal | ICD-10-CM

## 2017-11-13 DIAGNOSIS — Z791 Long term (current) use of non-steroidal anti-inflammatories (NSAID): Secondary | ICD-10-CM | POA: Diagnosis not present

## 2017-11-13 DIAGNOSIS — M5442 Lumbago with sciatica, left side: Secondary | ICD-10-CM | POA: Diagnosis not present

## 2017-11-13 DIAGNOSIS — M5441 Lumbago with sciatica, right side: Secondary | ICD-10-CM

## 2017-11-13 DIAGNOSIS — M62838 Other muscle spasm: Secondary | ICD-10-CM

## 2017-11-13 DIAGNOSIS — F419 Anxiety disorder, unspecified: Secondary | ICD-10-CM | POA: Insufficient documentation

## 2017-11-13 DIAGNOSIS — F1390 Sedative, hypnotic, or anxiolytic use, unspecified, uncomplicated: Secondary | ICD-10-CM | POA: Diagnosis not present

## 2017-11-13 DIAGNOSIS — M25561 Pain in right knee: Secondary | ICD-10-CM | POA: Diagnosis not present

## 2017-11-13 DIAGNOSIS — M25511 Pain in right shoulder: Secondary | ICD-10-CM | POA: Insufficient documentation

## 2017-11-13 DIAGNOSIS — M479 Spondylosis, unspecified: Secondary | ICD-10-CM | POA: Diagnosis not present

## 2017-11-13 DIAGNOSIS — G4733 Obstructive sleep apnea (adult) (pediatric): Secondary | ICD-10-CM | POA: Diagnosis not present

## 2017-11-13 DIAGNOSIS — K529 Noninfective gastroenteritis and colitis, unspecified: Secondary | ICD-10-CM | POA: Insufficient documentation

## 2017-11-13 DIAGNOSIS — Z87891 Personal history of nicotine dependence: Secondary | ICD-10-CM | POA: Insufficient documentation

## 2017-11-13 DIAGNOSIS — N62 Hypertrophy of breast: Secondary | ICD-10-CM | POA: Diagnosis not present

## 2017-11-13 DIAGNOSIS — M79601 Pain in right arm: Secondary | ICD-10-CM | POA: Insufficient documentation

## 2017-11-13 DIAGNOSIS — M25512 Pain in left shoulder: Secondary | ICD-10-CM | POA: Diagnosis not present

## 2017-11-13 DIAGNOSIS — F329 Major depressive disorder, single episode, unspecified: Secondary | ICD-10-CM | POA: Insufficient documentation

## 2017-11-13 DIAGNOSIS — M2460 Ankylosis, unspecified joint: Secondary | ICD-10-CM | POA: Insufficient documentation

## 2017-11-13 DIAGNOSIS — F209 Schizophrenia, unspecified: Secondary | ICD-10-CM | POA: Insufficient documentation

## 2017-11-13 DIAGNOSIS — M792 Neuralgia and neuritis, unspecified: Secondary | ICD-10-CM

## 2017-11-13 DIAGNOSIS — Z7982 Long term (current) use of aspirin: Secondary | ICD-10-CM | POA: Insufficient documentation

## 2017-11-13 DIAGNOSIS — Z981 Arthrodesis status: Secondary | ICD-10-CM | POA: Diagnosis not present

## 2017-11-13 DIAGNOSIS — K219 Gastro-esophageal reflux disease without esophagitis: Secondary | ICD-10-CM | POA: Diagnosis not present

## 2017-11-13 DIAGNOSIS — M25562 Pain in left knee: Secondary | ICD-10-CM | POA: Insufficient documentation

## 2017-11-13 DIAGNOSIS — Z79891 Long term (current) use of opiate analgesic: Secondary | ICD-10-CM | POA: Diagnosis not present

## 2017-11-13 DIAGNOSIS — M79605 Pain in left leg: Secondary | ICD-10-CM | POA: Insufficient documentation

## 2017-11-13 MED ORDER — CYCLOBENZAPRINE HCL 10 MG PO TABS: 10.0000 mg | ORAL_TABLET | Freq: Every day | ORAL | 0 refills | Status: DC

## 2017-11-13 MED ORDER — OXYCODONE HCL 5 MG PO TABS: 5.0000 mg | ORAL_TABLET | Freq: Three times a day (TID) | ORAL | 0 refills | Status: DC | PRN

## 2017-11-13 MED ORDER — LYRICA 100 MG PO CAPS: 100.0000 mg | ORAL_CAPSULE | Freq: Two times a day (BID) | ORAL | 0 refills | Status: DC

## 2017-11-13 NOTE — Patient Instructions (Addendum)
____________________________________________________________________________________________  Preparing for Procedure with Sedation Instructions: . Oral Intake: Do not eat or drink anything for at least 8 hours prior to your procedure. . Transportation: Public transportation is not allowed. Bring an adult driver. The driver must be physically present in our waiting room before any procedure can be started. . Physical Assistance: Bring an adult physically capable of assisting you, in the event you need help. This adult should keep you company at home for at least 6 hours after the procedure. . Blood Pressure Medicine: Take your blood pressure medicine with a sip of water the morning of the procedure. . Blood thinners:  . Diabetics on insulin: Notify the staff so that you can be scheduled 1st case in the morning. If your diabetes requires high dose insulin, take only  of your normal insulin dose the morning of the procedure and notify the staff that you have done so. . Preventing infections: Shower with an antibacterial soap the morning of your procedure. . Build-up your immune system: Take 1000 mg of Vitamin C with every meal (3 times a day) the day prior to your procedure. . Antibiotics: Inform the staff if you have a condition or reason that requires you to take antibiotics before dental procedures. . Pregnancy: If you are pregnant, call and cancel the procedure. . Sickness: If you have a cold, fever, or any active infections, call and cancel the procedure. . Arrival: You must be in the facility at least 30 minutes prior to your scheduled procedure. . Children: Do not bring children with you. . Dress appropriately: Bring dark clothing that you would not mind if they get stained. . Valuables: Do not bring any jewelry or valuables. Procedure appointments are reserved for interventional treatments only. . No Prescription Refills. . No medication changes will be discussed during procedure  appointments. . No disability issues will be discussed. ____________________________________________________________________________________________   GENERAL RISKS AND COMPLICATIONS  What are the risk, side effects and possible complications? Generally speaking, most procedures are safe.  However, with any procedure there are risks, side effects, and the possibility of complications.  The risks and complications are dependent upon the sites that are lesioned, or the type of nerve block to be performed.  The closer the procedure is to the spine, the more serious the risks are.  Great care is taken when placing the radio frequency needles, block needles or lesioning probes, but sometimes complications can occur. 1. Infection: Any time there is an injection through the skin, there is a risk of infection.  This is why sterile conditions are used for these blocks.  There are four possible types of infection. 1. Localized skin infection. 2. Central Nervous System Infection-This can be in the form of Meningitis, which can be deadly. 3. Epidural Infections-This can be in the form of an epidural abscess, which can cause pressure inside of the spine, causing compression of the spinal cord with subsequent paralysis. This would require an emergency surgery to decompress, and there are no guarantees that the patient would recover from the paralysis. 4. Discitis-This is an infection of the intervertebral discs.  It occurs in about 1% of discography procedures.  It is difficult to treat and it may lead to surgery.        2. Pain: the needles have to go through skin and soft tissues, will cause soreness.       3. Damage to internal structures:  The nerves to be lesioned may be near blood vessels or      other nerves which can be potentially damaged.       4. Bleeding: Bleeding is more common if the patient is taking blood thinners such as  aspirin, Coumadin, Ticiid, Plavix, etc., or if he/she have some genetic  predisposition  such as hemophilia. Bleeding into the spinal canal can cause compression of the spinal  cord with subsequent paralysis.  This would require an emergency surgery to  decompress and there are no guarantees that the patient would recover from the  paralysis.       5. Pneumothorax:  Puncturing of a lung is a possibility, every time a needle is introduced in  the area of the chest or upper back.  Pneumothorax refers to free air around the  collapsed lung(s), inside of the thoracic cavity (chest cavity).  Another two possible  complications related to a similar event would include: Hemothorax and Chylothorax.   These are variations of the Pneumothorax, where instead of air around the collapsed  lung(s), you may have blood or chyle, respectively.       6. Spinal headaches: They may occur with any procedures in the area of the spine.       7. Persistent CSF (Cerebro-Spinal Fluid) leakage: This is a rare problem, but may occur  with prolonged intrathecal or epidural catheters either due to the formation of a fistulous  track or a dural tear.       8. Nerve damage: By working so close to the spinal cord, there is always a possibility of  nerve damage, which could be as serious as a permanent spinal cord injury with  paralysis.       9. Death:  Although rare, severe deadly allergic reactions known as "Anaphylactic  reaction" can occur to any of the medications used.      10. Worsening of the symptoms:  We can always make thing worse.  What are the chances of something like this happening? Chances of any of this occuring are extremely low.  By statistics, you have more of a chance of getting killed in a motor vehicle accident: while driving to the hospital than any of the above occurring .  Nevertheless, you should be aware that they are possibilities.  In general, it is similar to taking a shower.  Everybody knows that you can slip, hit your head and get killed.  Does that mean that you should not  shower again?  Nevertheless always keep in mind that statistics do not mean anything if you happen to be on the wrong side of them.  Even if a procedure has a 1 (one) in a 1,000,000 (million) chance of going wrong, it you happen to be that one..Also, keep in mind that by statistics, you have more of a chance of having something go wrong when taking medications.  Who should not have this procedure? If you are on a blood thinning medication (e.g. Coumadin, Plavix, see list of "Blood Thinners"), or if you have an active infection going on, you should not have the procedure.  If you are taking any blood thinners, please inform your physician.  How should I prepare for this procedure?  Do not eat or drink anything at least six hours prior to the procedure.  Bring a driver with you .  It cannot be a taxi.  Come accompanied by an adult that can drive you back, and that is strong enough to help you if your legs get weak or numb from the local anesthetic.  Take   all of your medicines the morning of the procedure with just enough water to swallow them.  If you have diabetes, make sure that you are scheduled to have your procedure done first thing in the morning, whenever possible.  If you have diabetes, take only half of your insulin dose and notify our nurse that you have done so as soon as you arrive at the clinic.  If you are diabetic, but only take blood sugar pills (oral hypoglycemic), then do not take them on the morning of your procedure.  You may take them after you have had the procedure.  Do not take aspirin or any aspirin-containing medications, at least eleven (11) days prior to the procedure.  They may prolong bleeding.  Wear loose fitting clothing that may be easy to take off and that you would not mind if it got stained with Betadine or blood.  Do not wear any jewelry or perfume  Remove any nail coloring.  It will interfere with some of our monitoring equipment.  NOTE: Remember that  this is not meant to be interpreted as a complete list of all possible complications.  Unforeseen problems may occur.  BLOOD THINNERS The following drugs contain aspirin or other products, which can cause increased bleeding during surgery and should not be taken for 2 weeks prior to and 1 week after surgery.  If you should need take something for relief of minor pain, you may take acetaminophen which is found in Tylenol,m Datril, Anacin-3 and Panadol. It is not blood thinner. The products listed below are.  Do not take any of the products listed below in addition to any listed on your instruction sheet.  A.P.C or A.P.C with Codeine Codeine Phosphate Capsules #3 Ibuprofen Ridaura  ABC compound Congesprin Imuran rimadil  Advil Cope Indocin Robaxisal  Alka-Seltzer Effervescent Pain Reliever and Antacid Coricidin or Coricidin-D  Indomethacin Rufen  Alka-Seltzer plus Cold Medicine Cosprin Ketoprofen S-A-C Tablets  Anacin Analgesic Tablets or Capsules Coumadin Korlgesic Salflex  Anacin Extra Strength Analgesic tablets or capsules CP-2 Tablets Lanoril Salicylate  Anaprox Cuprimine Capsules Levenox Salocol  Anexsia-D Dalteparin Magan Salsalate  Anodynos Darvon compound Magnesium Salicylate Sine-off  Ansaid Dasin Capsules Magsal Sodium Salicylate  Anturane Depen Capsules Marnal Soma  APF Arthritis pain formula Dewitt's Pills Measurin Stanback  Argesic Dia-Gesic Meclofenamic Sulfinpyrazone  Arthritis Bayer Timed Release Aspirin Diclofenac Meclomen Sulindac  Arthritis pain formula Anacin Dicumarol Medipren Supac  Analgesic (Safety coated) Arthralgen Diffunasal Mefanamic Suprofen  Arthritis Strength Bufferin Dihydrocodeine Mepro Compound Suprol  Arthropan liquid Dopirydamole Methcarbomol with Aspirin Synalgos  ASA tablets/Enseals Disalcid Micrainin Tagament  Ascriptin Doan's Midol Talwin  Ascriptin A/D Dolene Mobidin Tanderil  Ascriptin Extra Strength Dolobid Moblgesic Ticlid  Ascriptin with Codeine  Doloprin or Doloprin with Codeine Momentum Tolectin  Asperbuf Duoprin Mono-gesic Trendar  Aspergum Duradyne Motrin or Motrin IB Triminicin  Aspirin plain, buffered or enteric coated Durasal Myochrisine Trigesic  Aspirin Suppositories Easprin Nalfon Trillsate  Aspirin with Codeine Ecotrin Regular or Extra Strength Naprosyn Uracel  Atromid-S Efficin Naproxen Ursinus  Auranofin Capsules Elmiron Neocylate Vanquish  Axotal Emagrin Norgesic Verin  Azathioprine Empirin or Empirin with Codeine Normiflo Vitamin E  Azolid Emprazil Nuprin Voltaren  Bayer Aspirin plain, buffered or children's or timed BC Tablets or powders Encaprin Orgaran Warfarin Sodium  Buff-a-Comp Enoxaparin Orudis Zorpin  Buff-a-Comp with Codeine Equegesic Os-Cal-Gesic   Buffaprin Excedrin plain, buffered or Extra Strength Oxalid   Bufferin Arthritis Strength Feldene Oxphenbutazone   Bufferin plain or Extra Strength Feldene Capsules   Oxycodone with Aspirin   Bufferin with Codeine Fenoprofen Fenoprofen Pabalate or Pabalate-SF   Buffets II Flogesic Panagesic   Buffinol plain or Extra Strength Florinal or Florinal with Codeine Panwarfarin   Buf-Tabs Flurbiprofen Penicillamine   Butalbital Compound Four-way cold tablets Penicillin   Butazolidin Fragmin Pepto-Bismol   Carbenicillin Geminisyn Percodan   Carna Arthritis Reliever Geopen Persantine   Carprofen Gold's salt Persistin   Chloramphenicol Goody's Phenylbutazone   Chloromycetin Haltrain Piroxlcam   Clmetidine heparin Plaquenil   Cllnoril Hyco-pap Ponstel   Clofibrate Hydroxy chloroquine Propoxyphen         Before stopping any of these medications, be sure to consult the physician who ordered them.  Some, such as Coumadin (Warfarin) are ordered to prevent or treat serious conditions such as "deep thrombosis", "pumonary embolisms", and other heart problems.  The amount of time that you may need off of the medication may also vary with the medication and the reason for which  you were taking it.  If you are taking any of these medications, please make sure you notify your pain physician before you undergo any procedures.          Epidural Steroid Injection An epidural steroid injection is a shot of steroid medicine and numbing medicine that is given into the space between the spinal cord and the bones in your back (epidural space). The shot helps relieve pain caused by an irritated or swollen nerve root. The amount of pain relief you get from the injection depends on what is causing the nerve to be swollen and irritated, and how long your pain lasts. You are more likely to benefit from this injection if your pain is strong and comes on suddenly rather than if you have had pain for a long time. Tell a health care provider about:  Any allergies you have.  All medicines you are taking, including vitamins, herbs, eye drops, creams, and over-the-counter medicines.  Any problems you or family members have had with anesthetic medicines.  Any blood disorders you have.  Any surgeries you have had.  Any medical conditions you have.  Whether you are pregnant or may be pregnant. What are the risks? Generally, this is a safe procedure. However, problems may occur, including:  Headache.  Bleeding.  Infection.  Allergic reaction to medicines.  Damage to your nerves.  What happens before the procedure? Staying hydrated Follow instructions from your health care provider about hydration, which may include:  Up to 2 hours before the procedure - you may continue to drink clear liquids, such as water, clear fruit juice, black coffee, and plain tea.  Eating and drinking restrictions Follow instructions from your health care provider about eating and drinking, which may include:  8 hours before the procedure - stop eating heavy meals or foods such as meat, fried foods, or fatty foods.  6 hours before the procedure - stop eating light meals or foods, such as  toast or cereal.  6 hours before the procedure - stop drinking milk or drinks that contain milk.  2 hours before the procedure - stop drinking clear liquids.  Medicine  You may be given medicines to lower anxiety.  Ask your health care provider about: ? Changing or stopping your regular medicines. This is especially important if you are taking diabetes medicines or blood thinners. ? Taking medicines such as aspirin and ibuprofen. These medicines can thin your blood. Do not take these medicines before your procedure if your health care provider instructs you  not to. General instructions  Plan to have someone take you home from the hospital or clinic. What happens during the procedure?  You may receive a medicine to help you relax (sedative).  You will be asked to lie on your abdomen.  The injection site will be cleaned.  A numbing medicine (local anesthetic) will be used to numb the injection site.  A needle will be inserted through your skin into the epidural space. You may feel some discomfort when this happens. An X-ray machine will be used to make sure the needle is put as close as possible to the affected nerve.  A steroid medicine and a local anesthetic will be injected into the epidural space.  The needle will be removed.  A bandage (dressing) will be put over the injection site. What happens after the procedure?  Your blood pressure, heart rate, breathing rate, and blood oxygen level will be monitored until the medicines you were given have worn off.  Your arm or leg may feel weak or numb for a few hours.  The injection site may feel sore.  Do not drive for 24 hours if you received a sedative. This information is not intended to replace advice given to you by your health care provider. Make sure you discuss any questions you have with your health care provider. Document Released: 01/31/2008 Document Revised: 04/06/2016 Document Reviewed: 02/09/2016 Elsevier  Interactive Patient Education  2018 [employer].  ____________________________________________________________________________________________  Pain Scale  Introduction: The pain score used by this practice is the Verbal Numerical Rating Scale (VNRS-11). This is an 11-point scale. It is for adults and children 10 years or older. There are significant differences in how the pain score is reported, used, and applied. Forget everything you learned in the past and learn this scoring system.  General Information: The scale should reflect your current level of pain. Unless you are specifically asked for the level of your worst pain, or your average pain. If you are asked for one of these two, then it should be understood that it is over the past 24 hours.  Basic Activities of Daily Living (ADL): Personal hygiene, dressing, eating, transferring, and using restroom.  Instructions: Most patients tend to report their level of pain as a combination of two factors, their physical pain and their psychosocial pain. This last one is also known as "suffering" and it is reflection of how physical pain affects you socially and psychologically. From now on, report them separately. From this point on, when asked to report your pain level, report only your physical pain. Use the following table for reference.  Pain Clinic Pain Levels (0-5/10)  Pain Level Score  Description  No Pain 0   Mild pain 1 Nagging, annoying, but does not interfere with basic activities of daily living (ADL). Patients are able to eat, bathe, get dressed, toileting (being able to get on and off the toilet and perform personal hygiene functions), transfer (move in and out of bed or a chair without assistance), and maintain continence (able to control bladder and bowel functions). Blood pressure and heart rate are unaffected. A normal heart rate for a healthy adult ranges from 60 to 100 bpm (beats per minute).   Mild to moderate pain 2  Noticeable and distracting. Impossible to hide from other people. More frequent flare-ups. Still possible to adapt and function close to normal. It can be very annoying and may have occasional stronger flare-ups. With discipline, patients may get used to it and adapt.  Moderate pain 3 Interferes significantly with activities of daily living (ADL). It becomes difficult to feed, bathe, get dressed, get on and off the toilet or to perform personal hygiene functions. Difficult to get in and out of bed or a chair without assistance. Very distracting. With effort, it can be ignored when deeply involved in activities.   Moderately severe pain 4 Impossible to ignore for more than a few minutes. With effort, patients may still be able to manage work or participate in some social activities. Very difficult to concentrate. Signs of autonomic nervous system discharge are evident: dilated pupils (mydriasis); mild sweating (diaphoresis); sleep interference. Heart rate becomes elevated (>115 bpm). Diastolic blood pressure (lower number) rises above 100 mmHg. Patients find relief in laying down and not moving.   Severe pain 5 Intense and extremely unpleasant. Associated with frowning face and frequent crying. Pain overwhelms the senses.  Ability to do any activity or maintain social relationships becomes significantly limited. Conversation becomes difficult. Pacing back and forth is common, as getting into a comfortable position is nearly impossible. Pain wakes you up from deep sleep. Physical signs will be obvious: pupillary dilation; increased sweating; goosebumps; brisk reflexes; cold, clammy hands and feet; nausea, vomiting or dry heaves; loss of appetite; significant sleep disturbance with inability to fall asleep or to remain asleep. When persistent, significant weight loss is observed due to the complete loss of appetite and sleep deprivation.  Blood pressure and heart rate becomes significantly elevated. Caution:  If elevated blood pressure triggers a pounding headache associated with blurred vision, then the patient should immediately seek attention at an urgent or emergency care unit, as these may be signs of an impending stroke.    Emergency Department Pain Levels (6-10/10)  Emergency Room Pain 6 Severely limiting. Requires emergency care and should not be seen or managed at an outpatient pain management facility. Communication becomes difficult and requires great effort. Assistance to reach the emergency department may be required. Facial flushing and profuse sweating along with potentially dangerous increases in heart rate and blood pressure will be evident.   Distressing pain 7 Self-care is very difficult. Assistance is required to transport, or use restroom. Assistance to reach the emergency department will be required. Tasks requiring coordination, such as bathing and getting dressed become very difficult.   Disabling pain 8 Self-care is no longer possible. At this level, pain is disabling. The individual is unable to do even the most "basic" activities such as walking, eating, bathing, dressing, transferring to a bed, or toileting. Fine motor skills are lost. It is difficult to think clearly.   Incapacitating pain 9 Pain becomes incapacitating. Thought processing is no longer possible. Difficult to remember your own name. Control of movement and coordination are lost.   The worst pain imaginable 10 At this level, most patients pass out from pain. When this level is reached, collapse of the autonomic nervous system occurs, leading to a sudden drop in blood pressure and heart rate. This in turn results in a temporary and dramatic drop in blood flow to the brain, leading to a loss of consciousness. Fainting is one of the body's self defense mechanisms. Passing out puts the brain in a calmed state and causes it to shut down for a while, in order to begin the healing process.    Summary: 1. Refer to this  scale when providing [place] with your pain level. 2. Be accurate and careful when reporting your pain level. This will help with your care. 3.  Over-reporting your pain level will lead to loss of credibility. 4. Even a level of 1/10 means that there is pain and will be treated at our facility. 5. High, inaccurate reporting will be documented as "Symptom Exaggeration", leading to loss of credibility and suspicions of possible secondary gains such as obtaining more narcotics, or wanting to appear disabled, for fraudulent reasons. 6. Only pain levels of 5 or below will be seen at our facility. 7. Pain levels of 6 and above will be sent to the Emergency Department and the appointment cancelled. ____________________________________________________________________________________________

## 2017-11-21 ENCOUNTER — Ambulatory Visit
Admission: RE | Admit: 2017-11-21 | Discharge: 2017-11-21 | Disposition: A | Discharge status: Home / Self Care | Payer: PPO | Source: Ambulatory Visit | Attending: Pain Medicine | Admitting: Pain Medicine

## 2017-11-21 ENCOUNTER — Encounter: Payer: Self-pay | Admitting: Pain Medicine

## 2017-11-21 ENCOUNTER — Ambulatory Visit (HOSPITAL_BASED_OUTPATIENT_CLINIC_OR_DEPARTMENT_OTHER): Payer: PPO | Admitting: Pain Medicine

## 2017-11-21 VITALS — BP 140/74 | HR 86 | Temp 96.8°F | Resp 14 | Ht 63.0 in | Wt 200.0 lb

## 2017-11-21 DIAGNOSIS — M47894 Other spondylosis, thoracic region: Secondary | ICD-10-CM | POA: Diagnosis not present

## 2017-11-21 DIAGNOSIS — M5414 Radiculopathy, thoracic region: Secondary | ICD-10-CM

## 2017-11-21 DIAGNOSIS — M961 Postlaminectomy syndrome, not elsewhere classified: Secondary | ICD-10-CM | POA: Insufficient documentation

## 2017-11-21 DIAGNOSIS — M4814 Ankylosing hyperostosis [Forestier], thoracic region: Secondary | ICD-10-CM | POA: Insufficient documentation

## 2017-11-21 DIAGNOSIS — M4324 Fusion of spine, thoracic region: Secondary | ICD-10-CM | POA: Insufficient documentation

## 2017-11-21 DIAGNOSIS — M4724 Other spondylosis with radiculopathy, thoracic region: Secondary | ICD-10-CM

## 2017-11-21 DIAGNOSIS — M4326 Fusion of spine, lumbar region: Secondary | ICD-10-CM | POA: Insufficient documentation

## 2017-11-21 DIAGNOSIS — M5114 Intervertebral disc disorders with radiculopathy, thoracic region: Secondary | ICD-10-CM | POA: Diagnosis not present

## 2017-11-21 DIAGNOSIS — M4810 Ankylosing hyperostosis [Forestier], site unspecified: Secondary | ICD-10-CM | POA: Insufficient documentation

## 2017-11-21 DIAGNOSIS — G8929 Other chronic pain: Secondary | ICD-10-CM | POA: Insufficient documentation

## 2017-11-21 DIAGNOSIS — M79604 Pain in right leg: Secondary | ICD-10-CM

## 2017-11-21 DIAGNOSIS — M5410 Radiculopathy, site unspecified: Secondary | ICD-10-CM

## 2017-11-21 DIAGNOSIS — M5134 Other intervertebral disc degeneration, thoracic region: Secondary | ICD-10-CM

## 2017-11-21 DIAGNOSIS — M79605 Pain in left leg: Secondary | ICD-10-CM

## 2017-11-21 MED ORDER — IOPAMIDOL (ISOVUE-M 200) INJECTION 41%
10.0000 mL | Freq: Once | INTRAMUSCULAR | Status: DC
Start: 2017-11-21 — End: 2017-11-21
  Filled 2017-11-21: qty 10

## 2017-11-21 MED ORDER — ROPIVACAINE HCL 2 MG/ML IJ SOLN
2.0000 mL | Freq: Once | INTRAMUSCULAR | Status: AC
  Administered 2017-11-21: 10 mL via EPIDURAL
  Filled 2017-11-21: qty 10

## 2017-11-21 MED ORDER — MIDAZOLAM HCL 5 MG/5ML IJ SOLN
1.0000 mg | INTRAMUSCULAR | Status: DC | PRN
  Administered 2017-11-21: 4 mg via INTRAVENOUS
  Filled 2017-11-21: qty 5

## 2017-11-21 MED ORDER — LACTATED RINGERS IV SOLN
1000.0000 mL | Freq: Once | INTRAVENOUS | Status: AC
  Administered 2017-11-21: 1000 mL via INTRAVENOUS

## 2017-11-21 MED ORDER — DEXAMETHASONE SODIUM PHOSPHATE 10 MG/ML IJ SOLN
10.0000 mg | Freq: Once | INTRAMUSCULAR | Status: AC
  Administered 2017-11-21: 10 mg
  Filled 2017-11-21: qty 1

## 2017-11-21 MED ORDER — IOPAMIDOL (ISOVUE-M 200) INJECTION 41%
10.0000 mL | Freq: Once | INTRAMUSCULAR | Status: AC
  Administered 2017-11-21: 10 mL via EPIDURAL

## 2017-11-21 MED ORDER — SODIUM CHLORIDE 0.9 % IJ SOLN
INTRAMUSCULAR | Status: AC
  Filled 2017-11-21: qty 10

## 2017-11-21 MED ORDER — FENTANYL CITRATE (PF) 100 MCG/2ML IJ SOLN
25.0000 ug | INTRAMUSCULAR | Status: DC | PRN
  Administered 2017-11-21: 100 ug via INTRAVENOUS
  Filled 2017-11-21: qty 2

## 2017-11-21 MED ORDER — SODIUM CHLORIDE 0.9% FLUSH
2.0000 mL | Freq: Once | INTRAVENOUS | Status: AC
  Administered 2017-11-21: 10 mL

## 2017-11-21 MED ORDER — LIDOCAINE HCL 2 % IJ SOLN
10.0000 mL | Freq: Once | INTRAMUSCULAR | Status: AC
  Administered 2017-11-21: 400 mg
  Filled 2017-11-21: qty 40

## 2017-11-21 NOTE — Progress Notes (Signed)
Patient's Name: Jennifer Harmon  MRN: [id number]  Referring Provider: Ignatius Specking, MD  DOB: 08/30/1961  PCP: Ignatius Specking, MD  DOS: 11/21/2017  Note by: Oswaldo Done, MD  Service setting: Ambulatory outpatient  Specialty: Interventional Pain Management  Patient type: Established  Location: [facility] (AMB) Pain Management Facility  Visit type: Interventional Procedure   Primary Reason for Visit: Interventional Pain Management Treatment. CC: Back Pain (mid back pain worse on the right)  Procedure:  Anesthesia, Analgesia, Anxiolysis:  Type: Therapeutic Inter-Laminar Thoracic Epidural Block Region: Posterior Thoracolumbar Level: T6-7 Laterality: Right-Sided Paraspinal  Type: Local Anesthesia with Moderate (Conscious) Sedation Local Anesthetic: Lidocaine 1% Route: Intravenous (IV) IV Access: Secured Sedation: Meaningful verbal contact was maintained at all times during the procedure  Indication(s): Analgesia and Anxiety   Indications: 1. Thoracic radiculitis (Right)   2. Thoracic radiculopathy due to osteoarthritis of spine   3. DDD (degenerative disc disease), thoracic   4. DISH (diffuse idiopathic skeletal hyperostosis)   5. 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    Pain Score: Pre-procedure: 7 /10 Post-procedure: 0-No pain/10  Pre-op Assessment:  Jennifer Harmon is a 57 y.o. (year old), female patient, seen today for interventional treatment. She  has a past surgical history that includes Cholecystectomy; Cesarean section; and Back surgery (2008). Jennifer Harmon has a current medication list which includes the following prescription(s): aspirin, citalopram, clonazepam, cyclobenzaprine, ibuprofen, latanoprost, lyrica, oxycodone, pantoprazole, and ranitidine, and the following Facility-Administered Medications: fentanyl, iopamidol, and midazolam. Her primarily concern today is the Back Pain (mid back pain worse on the right)  Initial Vital Signs: There were no vitals taken for this  visit. BMI: Estimated body mass index is 35.43 kg/m as calculated from the following:   Height as of this encounter: 5\' 3"  (1.6 m).   Weight as of this encounter: 200 lb (90.7 kg).  Risk Assessment: Allergies: Reviewed. She is allergic to bupropion and tape.  Allergy Precautions: None required Coagulopathies: Reviewed. None identified.  Blood-thinner therapy: None at this time Active Infection(s): Reviewed. None identified. [name] is afebrile  Site Confirmation: Jennifer Harmon was asked to confirm the procedure and laterality before marking the site Procedure checklist: Completed Consent: Before the procedure and under the influence of no sedative(s), amnesic(s), or anxiolytics, the patient was informed of the treatment options, risks and possible complications. To fulfill our ethical and legal obligations, as recommended by the American Medical Association's Code of Ethics, I have informed the patient of my clinical impression; the nature and purpose of the treatment or procedure; the risks, benefits, and possible complications of the intervention; the alternatives, including doing nothing; the risk(s) and benefit(s) of the alternative treatment(s) or procedure(s); and the risk(s) and benefit(s) of doing nothing. The patient was provided information about the general risks and possible complications associated with the procedure. These may include, but are not limited to: failure to achieve desired goals, infection, bleeding, organ or nerve damage, allergic reactions, paralysis, and death. In addition, the patient was informed of those risks and complications associated to Spine-related procedures, such as failure to decrease pain; infection (i.e.: Meningitis, epidural or intraspinal abscess); bleeding (i.e.: epidural hematoma, subarachnoid hemorrhage, or any other type of intraspinal or peri-dural bleeding); organ or nerve damage (i.e.: Any type of peripheral nerve, nerve root, or spinal cord  injury) with subsequent damage to sensory, motor, and/or autonomic systems, resulting in permanent pain, numbness, and/or weakness of one or several areas of the body; allergic reactions; (i.e.: anaphylactic reaction); and/or  death. Furthermore, the patient was informed of those risks and complications associated with the medications. These include, but are not limited to: allergic reactions (i.e.: anaphylactic or anaphylactoid reaction(s)); adrenal axis suppression; blood sugar elevation that in diabetics may result in ketoacidosis or comma; water retention that in patients with history of congestive heart failure may result in shortness of breath, pulmonary edema, and decompensation with resultant heart failure; weight gain; swelling or edema; medication-induced neural toxicity; particulate matter embolism and blood vessel occlusion with resultant organ, and/or nervous system infarction; and/or aseptic necrosis of one or more joints. Finally, the patient was informed that Medicine is not an exact science; therefore, there is also the possibility of unforeseen or unpredictable risks and/or possible complications that may result in a catastrophic outcome. The patient indicated having understood very clearly. We have given the patient no guarantees and we have made no promises. Enough time was given to the patient to ask questions, all of which were answered to the patient's satisfaction. Jennifer Harmon has indicated that she wanted to continue with the procedure. Attestation: I, the ordering provider, attest that I have discussed with the patient the benefits, risks, side-effects, alternatives, likelihood of achieving goals, and potential problems during recovery for the procedure that I have provided informed consent. Date: 11/21/2017; Time: 7:08 AM  Pre-Procedure Preparation:  Monitoring: As per clinic protocol. Respiration, ETCO2, SpO2, BP, heart rate and rhythm monitor placed and checked for adequate  function Safety Precautions: Patient was assessed for positional comfort and pressure points before starting the procedure. Time-out: I initiated and conducted the "Time-out" before starting the procedure, as per protocol. The patient was asked to participate by confirming the accuracy of the "Time Out" information. Verification of the correct person, site, and procedure were performed and confirmed by me, the nursing staff, and the patient. "Time-out" conducted as per Joint Commission's Universal Protocol (UP.01.01.01). "Time-out" Date & Time: 11/21/2017; 0915 hrs.  Description of Procedure Process:  Position: Prone Target Area: For Epidural Steroid injection(s), the target area is the  interlaminar space, initially targeting the lower border of the superior vertebral body lamina. Approach: Interlaminar approach. Area Prepped: Entire Posterior Thoracolumbar Region Prepping solution: ChloraPrep (2% chlorhexidine gluconate and 70% isopropyl alcohol) Safety Precautions: Aspiration looking for blood return was conducted prior to all injections. At no point did we inject any substances, as a needle was being advanced. No attempts were made at seeking any paresthesias. Safe injection practices and needle disposal techniques used. Medications properly checked for expiration dates. SDV (single dose vial) medications used. Description of the Procedure: Protocol guidelines were followed. The patient was placed in position over the fluoroscopy table. The target area was identified and the area prepped in the usual manner. Skin & deeper tissues infiltrated with local anesthetic. Appropriate amount of time allowed to pass for local anesthetics to take effect. The procedure needles were then advanced to the target area. The inferior aspect of the superior lamina was contacted and the needle walked caudad, until the lamina was cleared. The epidural space was identified using "loss-of-resistance technique" with 0.9%  PF-NSS (2-25mL), in a low friction 10cc LOR glass syringe. Proper needle placement was secured. Negative aspiration confirmed. Solution injected in intermittent fashion, asking for systemic symptoms every 0.5 cc of injectate. The needles were then removed and the area cleansed, making sure to leave some of the prepping solution behind to take advantage of its long term bactericidal properties. Vitals:   11/21/17 4098 11/21/17 1191 11/21/17 4782 11/21/17 0957  BP: 134/75 (!) 144/85 139/76 140/74  Pulse:      Resp: 14 16 20 14   Temp:  (!) 97 F (36.1 C)  (!) 96.8 F (36 C)  TempSrc:      SpO2: 95% 96% 96% 96%  Weight:      Height:        Start Time: 0915 hrs. End Time: 0925 hrs. Materials:  Needle(s) Type: Epidural needle Gauge: 17G Length: 3.5-in Medication(s): We administered midazolam, fentaNYL, lactated ringers, lidocaine, iopamidol, sodium chloride flush, ropivacaine (PF) 2 mg/mL (0.2%), and dexamethasone. Please see chart orders for dosing details.  Imaging Guidance (Spinal):  Type of Imaging Technique: Fluoroscopy Guidance (Spinal) Indication(s): Assistance in needle guidance and placement for procedures requiring needle placement in or near specific anatomical locations not easily accessible without such assistance. Exposure Time: Please see nurses notes. Contrast: Before injecting any contrast, we confirmed that the patient did not have an allergy to iodine, shellfish, or radiological contrast. Once satisfactory needle placement was completed at the desired level, radiological contrast was injected. Contrast injected under live fluoroscopy. No contrast complications. See chart for type and volume of contrast used. Fluoroscopic Guidance: I was personally present during the use of fluoroscopy. "Tunnel Vision Technique" used to obtain the best possible view of the target area. Parallax error corrected before commencing the procedure. "Direction-depth-direction" technique used to  introduce the needle under continuous pulsed fluoroscopy. Once target was reached, antero-posterior, oblique, and lateral fluoroscopic projection used confirm needle placement in all planes. Images permanently stored in EMR. Interpretation: I personally interpreted the imaging intraoperatively. Adequate needle placement confirmed in multiple planes. Appropriate spread of contrast into desired area was observed. No evidence of afferent or efferent intravascular uptake. No intrathecal or subarachnoid spread observed. Permanent images saved into the patient's record.  Antibiotic Prophylaxis:  Indication(s): None identified Antibiotic given: None  Post-operative Assessment:  EBL: None Complications: No immediate post-treatment complications observed by team, or reported by patient. Note: The patient tolerated the entire procedure well. A repeat set of vitals were taken after the procedure and the patient was kept under observation following institutional policy, for this type of procedure. Post-procedural neurological assessment was performed, showing return to baseline, prior to discharge. The patient was provided with post-procedure discharge instructions, including a section on how to identify potential problems. Should any problems arise concerning this procedure, the patient was given instructions to immediately contact , at any time, without hesitation. In any case, we plan to contact the patient by telephone for a follow-up status report regarding this interventional procedure. Comments:  No additional relevant information.  Plan of Care    Imaging Orders     DG C-Arm 1-60 Min-No Report  Procedure Orders     Thoracic Epidural Injection  Medications ordered for procedure: Meds ordered this encounter  Medications  . iopamidol (ISOVUE-M) 41 % intrathecal injection 10 mL    Must be myelogram compatible.  . midazolam (VERSED) 5 MG/5ML injection 1-2 mg    Make sure Flumazenil is available  in the pyxis when using this medication. If oversedation occurs, administer 0.2 mg IV over 15 sec. If after 45 sec no response, administer 0.2 mg again over 1 min; may repeat at 1 min intervals; not to exceed 4 doses (1 mg)  . fentaNYL (SUBLIMAZE) injection 25-50 mcg    Make sure Narcan is available in the pyxis when using this medication. In the event of respiratory depression (RR< 8/min): Titrate NARCAN (naloxone) in increments of 0.1 to 0.2  mg IV at 2-3 minute intervals, until desired degree of reversal.  . lactated ringers infusion 1,000 mL  . lidocaine (XYLOCAINE) 2 % (with pres) injection 200 mg  . iopamidol (ISOVUE-M) 41 % intrathecal injection 10 mL  . sodium chloride flush (NS) 0.9 % injection 2 mL  . ropivacaine (PF) 2 mg/mL (0.2%) (NAROPIN) injection 2 mL  . dexamethasone (DECADRON) injection 10 mg   Medications administered: We administered midazolam, fentaNYL, lactated ringers, lidocaine, iopamidol, sodium chloride flush, ropivacaine (PF) 2 mg/mL (0.2%), and dexamethasone.  See the medical record for exact dosing, route, and time of administration.  New Prescriptions   No medications on file   Disposition: Discharge home  Discharge Date & Time: 11/21/2017; 1003 hrs.   Physician-requested Follow-up: Return for post-procedure eval (2 wks), w/ Dr. Laban Emperor. Future Appointments  Date Time Provider Department Center  12/04/2017  1:45 PM Delano Metz, MD [facility] None  08/24/2018  9:15 AM Pollyann Savoy, MD [facility] None   Primary Care Physician: Ignatius Specking, MD Location: [facility] Note by: Oswaldo Done, MD Date: 11/21/2017; Time: 10:25 AM  Disclaimer:  Medicine is not an exact science. The only guarantee in medicine is that nothing is guaranteed. It is important to note that the decision to proceed with this intervention was based on the information collected from the patient. The Data and conclusions were drawn from the patient's  questionnaire, the interview, and the physical examination. Because the information was provided in large part by the patient, it cannot be guaranteed that it has not been purposely or unconsciously manipulated. Every effort has been made to obtain as much relevant data as possible for this evaluation. It is important to note that the conclusions that lead to this procedure are derived in large part from the available data. Always take into account that the treatment will also be dependent on availability of resources and existing treatment guidelines, considered by other Pain Management Practitioners as being common knowledge and practice, at the time of the intervention. For Medico-Legal purposes, it is also important to point out that variation in procedural techniques and pharmacological choices are the acceptable norm. The indications, contraindications, technique, and results of the above procedure should only be interpreted and judged by a Board-Certified Interventional Pain Specialist with extensive familiarity and expertise in the same exact procedure and technique.

## 2017-11-21 NOTE — Patient Instructions (Signed)

## 2017-11-22 ENCOUNTER — Telehealth: Payer: Self-pay | Admitting: *Deleted

## 2017-11-22 NOTE — Telephone Encounter (Signed)
Denies complications post procedure. 

## 2017-12-04 ENCOUNTER — Ambulatory Visit: Payer: PPO | Attending: Pain Medicine | Admitting: Pain Medicine

## 2017-12-04 ENCOUNTER — Encounter: Payer: Self-pay | Admitting: Pain Medicine

## 2017-12-04 ENCOUNTER — Other Ambulatory Visit: Payer: Self-pay

## 2017-12-04 VITALS — BP 133/80 | HR 105 | Temp 97.8°F | Resp 16 | Ht 63.0 in | Wt 200.0 lb

## 2017-12-04 DIAGNOSIS — G894 Chronic pain syndrome: Secondary | ICD-10-CM | POA: Insufficient documentation

## 2017-12-04 DIAGNOSIS — M4724 Other spondylosis with radiculopathy, thoracic region: Secondary | ICD-10-CM | POA: Diagnosis not present

## 2017-12-04 DIAGNOSIS — M5114 Intervertebral disc disorders with radiculopathy, thoracic region: Secondary | ICD-10-CM | POA: Diagnosis not present

## 2017-12-04 DIAGNOSIS — M47816 Spondylosis without myelopathy or radiculopathy, lumbar region: Secondary | ICD-10-CM | POA: Diagnosis not present

## 2017-12-04 DIAGNOSIS — M797 Fibromyalgia: Secondary | ICD-10-CM | POA: Diagnosis not present

## 2017-12-04 DIAGNOSIS — M62830 Muscle spasm of back: Secondary | ICD-10-CM | POA: Insufficient documentation

## 2017-12-04 DIAGNOSIS — M4810 Ankylosing hyperostosis [Forestier], site unspecified: Secondary | ICD-10-CM | POA: Insufficient documentation

## 2017-12-04 DIAGNOSIS — M7918 Myalgia, other site: Secondary | ICD-10-CM | POA: Diagnosis not present

## 2017-12-04 DIAGNOSIS — M549 Dorsalgia, unspecified: Secondary | ICD-10-CM | POA: Diagnosis not present

## 2017-12-04 DIAGNOSIS — M25562 Pain in left knee: Secondary | ICD-10-CM | POA: Insufficient documentation

## 2017-12-04 DIAGNOSIS — Z791 Long term (current) use of non-steroidal anti-inflammatories (NSAID): Secondary | ICD-10-CM | POA: Insufficient documentation

## 2017-12-04 DIAGNOSIS — M79601 Pain in right arm: Secondary | ICD-10-CM | POA: Diagnosis not present

## 2017-12-04 DIAGNOSIS — K219 Gastro-esophageal reflux disease without esophagitis: Secondary | ICD-10-CM | POA: Diagnosis not present

## 2017-12-04 DIAGNOSIS — K529 Noninfective gastroenteritis and colitis, unspecified: Secondary | ICD-10-CM | POA: Diagnosis not present

## 2017-12-04 DIAGNOSIS — M25511 Pain in right shoulder: Secondary | ICD-10-CM | POA: Diagnosis not present

## 2017-12-04 DIAGNOSIS — M792 Neuralgia and neuritis, unspecified: Secondary | ICD-10-CM | POA: Diagnosis not present

## 2017-12-04 DIAGNOSIS — M19071 Primary osteoarthritis, right ankle and foot: Secondary | ICD-10-CM | POA: Diagnosis not present

## 2017-12-04 DIAGNOSIS — N62 Hypertrophy of breast: Secondary | ICD-10-CM | POA: Diagnosis not present

## 2017-12-04 DIAGNOSIS — M62838 Other muscle spasm: Secondary | ICD-10-CM

## 2017-12-04 DIAGNOSIS — Z79891 Long term (current) use of opiate analgesic: Secondary | ICD-10-CM | POA: Insufficient documentation

## 2017-12-04 DIAGNOSIS — Z7982 Long term (current) use of aspirin: Secondary | ICD-10-CM | POA: Insufficient documentation

## 2017-12-04 DIAGNOSIS — M25561 Pain in right knee: Secondary | ICD-10-CM | POA: Insufficient documentation

## 2017-12-04 DIAGNOSIS — M545 Low back pain: Secondary | ICD-10-CM | POA: Insufficient documentation

## 2017-12-04 DIAGNOSIS — M79602 Pain in left arm: Secondary | ICD-10-CM | POA: Insufficient documentation

## 2017-12-04 DIAGNOSIS — F419 Anxiety disorder, unspecified: Secondary | ICD-10-CM | POA: Diagnosis not present

## 2017-12-04 DIAGNOSIS — M479 Spondylosis, unspecified: Secondary | ICD-10-CM | POA: Insufficient documentation

## 2017-12-04 DIAGNOSIS — M19072 Primary osteoarthritis, left ankle and foot: Secondary | ICD-10-CM | POA: Diagnosis not present

## 2017-12-04 DIAGNOSIS — F329 Major depressive disorder, single episode, unspecified: Secondary | ICD-10-CM | POA: Diagnosis not present

## 2017-12-04 DIAGNOSIS — M5116 Intervertebral disc disorders with radiculopathy, lumbar region: Secondary | ICD-10-CM | POA: Insufficient documentation

## 2017-12-04 DIAGNOSIS — E7800 Pure hypercholesterolemia, unspecified: Secondary | ICD-10-CM | POA: Insufficient documentation

## 2017-12-04 DIAGNOSIS — M5414 Radiculopathy, thoracic region: Secondary | ICD-10-CM

## 2017-12-04 DIAGNOSIS — M542 Cervicalgia: Secondary | ICD-10-CM | POA: Insufficient documentation

## 2017-12-04 DIAGNOSIS — Z96653 Presence of artificial knee joint, bilateral: Secondary | ICD-10-CM | POA: Diagnosis not present

## 2017-12-04 DIAGNOSIS — M79604 Pain in right leg: Secondary | ICD-10-CM | POA: Insufficient documentation

## 2017-12-04 DIAGNOSIS — G8929 Other chronic pain: Secondary | ICD-10-CM

## 2017-12-04 DIAGNOSIS — M5441 Lumbago with sciatica, right side: Secondary | ICD-10-CM

## 2017-12-04 DIAGNOSIS — M5442 Lumbago with sciatica, left side: Secondary | ICD-10-CM

## 2017-12-04 DIAGNOSIS — Z79899 Other long term (current) drug therapy: Secondary | ICD-10-CM | POA: Insufficient documentation

## 2017-12-04 DIAGNOSIS — Z87891 Personal history of nicotine dependence: Secondary | ICD-10-CM | POA: Diagnosis not present

## 2017-12-04 DIAGNOSIS — M170 Bilateral primary osteoarthritis of knee: Secondary | ICD-10-CM | POA: Insufficient documentation

## 2017-12-04 DIAGNOSIS — M533 Sacrococcygeal disorders, not elsewhere classified: Secondary | ICD-10-CM | POA: Insufficient documentation

## 2017-12-04 DIAGNOSIS — M79605 Pain in left leg: Secondary | ICD-10-CM | POA: Insufficient documentation

## 2017-12-04 MED ORDER — LYRICA 100 MG PO CAPS: 100.0000 mg | ORAL_CAPSULE | Freq: Two times a day (BID) | ORAL | 0 refills | Status: DC

## 2017-12-04 MED ORDER — OXYCODONE HCL 5 MG PO TABS: 5.0000 mg | ORAL_TABLET | Freq: Two times a day (BID) | ORAL | 0 refills | Status: DC | PRN

## 2017-12-04 MED ORDER — CYCLOBENZAPRINE HCL 10 MG PO TABS: 10.0000 mg | ORAL_TABLET | Freq: Two times a day (BID) | ORAL | 0 refills | Status: DC | PRN

## 2017-12-04 MED ORDER — MAGNESIUM OXIDE -MG SUPPLEMENT 500 MG PO CAPS: 1.0000 | ORAL_CAPSULE | Freq: Two times a day (BID) | ORAL | 0 refills | Status: DC

## 2017-12-04 NOTE — Patient Instructions (Addendum)
____________________________________________________________________________________________  Preparing for Procedure with Sedation Instructions: . Oral Intake: Do not eat or drink anything for at least 8 hours prior to your procedure. . Transportation: Public transportation is not allowed. Bring an adult driver. The driver must be physically present in our waiting room before any procedure can be started. [name] Physical Assistance: Bring an adult physically capable of assisting you, in the event you need help. This adult should keep you company at home for at least 6 hours after the procedure. . Blood Pressure Medicine: Take your blood pressure medicine with a sip of water the morning of the procedure. . Blood thinners:  . Diabetics on insulin: Notify the staff so that you can be scheduled 1st case in the morning. If your diabetes requires high dose insulin, take only  of your normal insulin dose the morning of the procedure and notify the staff that you have done so. . Preventing infections: Shower with an antibacterial soap the morning of your procedure. . Build-up your immune system: Take 1000 mg of Vitamin C with every meal (3 times a day) the day prior to your procedure. [name] Antibiotics: Inform the staff if you have a condition or reason that requires you to take antibiotics before dental procedures. . Pregnancy: If you are pregnant, call and cancel the procedure. . Sickness: If you have a cold, fever, or any active infections, call and cancel the procedure. . Arrival: You must be in the facility at least 30 minutes prior to your scheduled procedure. . Children: Do not bring children with you. . Dress appropriately: Bring dark clothing that you would not mind if they get stained. . Valuables: Do not bring any jewelry or valuables. Procedure appointments are reserved for interventional treatments only. [name] No Prescription Refills. . No medication changes will be discussed during procedure  appointments. . No disability issues will be discussed. ____________________________________________________________________________________________  Take Magnesium 500 mg with breakfast and at bedtime.  Take Gatorade (any flavor), one glass with each meal. Take one multivitamin every day, at bedtime.   Facet Joint Block The facet joints connect the bones of the spine (vertebrae). They make it possible for you to bend, twist, and make other movements with your spine. They also keep you from bending too far, twisting too far, and making other excessive movements. A facet joint block is a procedure where a numbing medicine (anesthetic) is injected into a facet joint. Often, a type of anti-inflammatory medicine called a steroid is also injected. A facet joint block may be done to diagnose neck or back pain. If the pain gets better after a facet joint block, it means the pain is probably coming from the facet joint. If the pain does not get better, it means the pain is probably not coming from the facet joint. A facet joint block may also be done to relieve neck or back pain caused by an inflamed facet joint. A facet joint block is only done to relieve pain if the pain does not improve with other methods, such as medicine, exercise programs, and physical therapy. Tell a health care provider about:  Any allergies you have.  All medicines you are taking, including vitamins, herbs, eye drops, creams, and over-the-counter medicines.  Any problems you or family members have had with anesthetic medicines.  Any blood disorders you have.  Any surgeries you have had.  Any medical conditions you have.  Whether you are pregnant or may be pregnant. What are the risks? Generally, this is a safe  procedure. However, problems may occur, including:  Bleeding.  Injury to a nerve near the injection site.  Pain at the injection site.  Weakness or numbness in areas controlled by nerves near the injection  site.  Infection.  Temporary fluid retention.  Allergic reactions to medicines or dyes.  Injury to other structures or organs near the injection site.  What happens before the procedure?  Follow instructions from your health care provider about eating or drinking restrictions.  Ask your health care provider about: ? Changing or stopping your regular medicines. This is especially important if you are taking diabetes medicines or blood thinners. ? Taking medicines such as aspirin and ibuprofen. These medicines can thin your blood. Do not take these medicines before your procedure if your health care provider instructs you not to.  Do not take any new dietary supplements or medicines without asking your health care provider first.  Plan to have someone take you home after the procedure. What happens during the procedure?  You may need to remove your clothing and dress in an open-back gown.  The procedure will be done while you are lying on an X-ray table. You will most likely be asked to lie on your stomach, but you may be asked to lie in a different position if an injection will be made in your neck.  Machines will be used to monitor your oxygen levels, heart rate, and blood pressure.  If an injection will be made in your neck, an IV tube will be inserted into one of your veins. Fluids and medicine will flow directly into your body through the IV tube.  The area over the facet joint where the injection will be made will be cleaned with soap. The surrounding skin will be covered with clean drapes.  A numbing medicine (local anesthetic) will be applied to your skin. Your skin may sting or burn for a moment.  A video X-ray machine (fluoroscopy) will be used to locate the joint. In some cases, a CT scan may be used.  A contrast dye may be injected into the facet joint area to help locate the joint.  When the joint is located, an anesthetic will be injected into the joint through the  needle.  Your health care provider will ask you whether you feel pain relief. If you do feel relief, a steroid may be injected to provide pain relief for a longer period of time. If you do not feel relief or feel only partial relief, additional injections of an anesthetic may be made in other facet joints.  The needle will be removed.  Your skin will be cleaned.  A bandage (dressing) will be applied over each injection site. The procedure may vary among health care providers and hospitals. What happens after the procedure?  You will be observed for 15-30 minutes before being allowed to go home. This information is not intended to replace advice given to you by your health care provider. Make sure you discuss any questions you have with your health care provider. Document Released: 03/15/2007 Document Revised: 11/25/2015 Document Reviewed: 07/20/2015 Elsevier Interactive Patient Education  [employer].

## 2017-12-04 NOTE — Progress Notes (Signed)
Nursing Pain Medication Assessment:  Safety precautions to be maintained throughout the outpatient stay will include: orient to surroundings, keep bed in low position, maintain call bell within reach at all times, provide assistance with transfer out of bed and ambulation.  Medication Inspection Compliance: Pill count conducted under aseptic conditions, in front of the patient. Neither the pills nor the bottle was removed from the patient's sight at any time. Once count was completed pills were immediately returned to the patient in their original bottle.  Medication: See above Pill/Patch Count: 42 of 90 pills remain Pill/Patch Appearance: Markings consistent with prescribed medication Bottle Appearance: Standard pharmacy container. Clearly labeled. Filled Date: 1 / 7/ / 2019 Last Medication intake:  Today   Medication: Lyrica Pill/Patch Count: 9 of 60 pills remain Pill/Patch Appearance: Markings consistent with prescribed medication Bottle Appearance: Standard pharmacy container. Clearly labeled. Filled Date: 1 / 7 / 2019 Last Medication intake:  Today

## 2017-12-04 NOTE — Progress Notes (Signed)
Patient's Name: Jennifer Harmon  MRN: [id number]  Referring Provider: Glenda Chroman, MD  DOB: 07-08-1961  PCP: Glenda Chroman, MD  DOS: 12/04/2017  Note by: Gaspar Cola, MD  Service setting: Ambulatory outpatient  Specialty: Interventional Pain Management  Location: [facility] (AMB) Pain Management Facility    Patient type: Established   Primary Reason(s) for Visit: Encounter for prescription drug management & post-procedure evaluation of chronic illness with mild to moderate exacerbation(Level of risk: moderate) CC: Back Pain (lower) and Shoulder Pain (right- radiates down to upper arm)  HPI  Jennifer Harmon is a 57 y.o. year old, female patient, who comes today for a post-procedure evaluation and medication management. She has Dyslipidemia; History of gastroesophageal reflux (GERD); History of sleep apnea; History of depression; History of anxiety; Fibromyalgia; DDD (degenerative disc disease), lumbar; S/P partial knee replacement (Left); S/P total knee replacement (Right); Osteoarthritis of feet (Bilateral); History of diverticulosis; Former smoker; Chronic diarrhea; Chronic inflammatory arthritis; History of knee arthroplasty (Bilateral); Osteoarthritis of knee (Right); Symptomatic mammary hypertrophy; Chronic upper back pain (Primary Area of Pain) (Bilateral) (R>L); Chronic neck pain (Tertiary Area of Pain) (Bilateral) (R>L); Chronic shoulder pain; Chronic upper extremity pain (Bilateral) (R>L); Chronic pain syndrome; Long term current use of opiate analgesic; Disorder of skeletal system; Chronic sacroiliac joint pain (Bilateral); Chronic low back pain  (Secondary Area of Pain) (Bilateral) (R>L); Chronic knee pain (Fourth Area of Pain) (Bilateral) (R>L); Long term prescription opiate use; NSAID long-term use; Opiate use; Pharmacologic therapy; Problems influencing health status; Neurogenic pain; Chronic musculoskeletal pain; Chronic recurrent lower extremity muscle spasms (Bilateral); Long term  prescription benzodiazepine use; Thoracic radiculopathy due to osteoarthritis of spine; Thoracic radiculitis (T6/T7) (Right); Chronic lower extremity pain (Bilateral) (R>L); Chronic lower extremity radicular pain (L5) (Right); DISH (diffuse idiopathic skeletal hyperostosis); 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; DDD (degenerative disc disease), thoracic; Failed back surgical syndrome; Fusion of lumbar spine; Lumbar facet syndrome (Bilateral) (R>L); and Lumbar paraspinal muscle spasm on their problem list. Her primarily concern today is the Back Pain (lower) and Shoulder Pain (right- radiates down to upper arm)  Pain Assessment: Location: Lower Back Radiating: hips bilateral and down back of leg around to the thigh  Onset: More than a month ago Duration: Chronic pain Quality: (Spasms) Severity: 8 /10 (self-reported pain score)  Note: Reported level is compatible with observation.                         When using our objective Pain Scale, levels between 6 and 10/10 are said to belong in an emergency room, as it progressively worsens from a 6/10, described as severely limiting, requiring emergency care not usually available at an outpatient pain management facility. At a 6/10 level, communication becomes difficult and requires great effort. Assistance to reach the emergency department may be required. Facial flushing and profuse sweating along with potentially dangerous increases in heart rate and blood pressure will be evident. Effect on ADL: sleeping, long distances, sitting, bending, twisting Timing: Constant Modifying factors: changing positions, heat, cold  Jennifer Harmon was last seen on 11/21/2017 for a procedure. During today's appointment we reviewed Jennifer Harmon's post-procedure results, as well as her outpatient medication regimen.  The patient returns to the clinic today after having had a thoracic epidural steroid injection under fluoroscopic guidance and IV sedation.  The patient  indicates having attained almost complete relief of the thoracic radicular pain.  However, she has now noticed that her lower back  pain is worse.  This is likely due to the possibility that by eliminating the thoracic radiculitis, she can now notice more pain in the lumbar region.  Physical exam today was positive for bilateral lumbar facet pain with reproduction of the symptoms with hyperextension and rotation bilaterally.  In view of this, we will bring the patient back for a diagnostic bilateral lumbar facet block under fluoroscopic guidance and IV sedation.  Because the thoracic radiculitis is not completely gone, but simply improved, we will probably go back to doing some thoracic epidural steroid injections, once we get the lower back pain under control.  The patient has requested that we increase her Flexeril since she is experiencing spasms of the lower back.  I have recommended that she start taking multivitamins and Gatorade as the two most common causes of muscle cramps are vitamin deficiencies and electrolyte deficiencies.  The electrolyte deficiencies can occur as a consequence of the steroids since they can cause loss of electrolytes at the level of the kidney.  I have also recommended that she start taking magnesium 500 mg p.o. twice daily.  I have also recommended that she start taking over-the-counter calcium plus vitamin D and magnesium for the purpose of avoiding a vitamin D insufficiency/deficiency.  Further details on both, my assessment(s), as well as the proposed treatment plan, please see below.  Controlled Substance Pharmacotherapy Assessment REMS (Risk Evaluation and Mitigation Strategy)  Analgesic: Oxycodone IR 5 mg every 8 hours (15 mg/day of oxycodone) (58 MME/day).  The patient indicates that she is actually taking the oxycodone twice a day instead of 3 times a day. MME/day: 22.5 mg/day.  Ignatius Specking, RN  12/04/2017  2:06 PM  Signed Nursing Pain Medication Assessment:   Safety precautions to be maintained throughout the outpatient stay will include: orient to surroundings, keep bed in low position, maintain call bell within reach at all times, provide assistance with transfer out of bed and ambulation.  Medication Inspection Compliance: Pill count conducted under aseptic conditions, in front of the patient. Neither the pills nor the bottle was removed from the patient's sight at any time. Once count was completed pills were immediately returned to the patient in their original bottle.  Medication: See above Pill/Patch Count: 42 of 90 pills remain Pill/Patch Appearance: Markings consistent with prescribed medication Bottle Appearance: Standard pharmacy container. Clearly labeled. Filled Date: 1 / 7/ / 2019 Last Medication intake:  Today   Medication: Lyrica Pill/Patch Count: 9 of 60 pills remain Pill/Patch Appearance: Markings consistent with prescribed medication Bottle Appearance: Standard pharmacy container. Clearly labeled. Filled Date: 1 / 7 / 2019 Last Medication intake:  Today    Pharmacokinetics: Liberation and absorption (onset of action): WNL Distribution (time to peak effect): WNL Metabolism and excretion (duration of action): WNL         Pharmacodynamics: Desired effects: Analgesia: Ms. Scrogham reports >50% benefit. Functional ability: Patient reports that medication allows her to accomplish basic ADLs Clinically meaningful improvement in function (CMIF): Sustained CMIF goals met Perceived effectiveness: Described as relatively effective, allowing for increase in activities of daily living (ADL) Undesirable effects: Side-effects or Adverse reactions: None reported Monitoring: [place] PMP: Online review of the past 47-monthperiod conducted. Compliant with practice rules and regulations Last UDS on record: Summary  Date Value Ref Range Status  09/06/2017 FINAL  Final    Comment:     ==================================================================== [facility] COMP DRUG ANALYSIS,UR ==================================================================== Test  Result       Flag       Units Drug Present and Declared for Prescription Verification   7-aminoclonazepam              238          EXPECTED   ng/mg creat    7-aminoclonazepam is an expected metabolite of clonazepam. Source    of clonazepam is a scheduled prescription medication.   Oxycodone                      2298         EXPECTED   ng/mg creat   Oxymorphone                    376          EXPECTED   ng/mg creat   Noroxycodone                   3913         EXPECTED   ng/mg creat   Noroxymorphone                 77           EXPECTED   ng/mg creat    Sources of oxycodone are scheduled prescription medications.    Oxymorphone, noroxycodone, and noroxymorphone are expected    metabolites of oxycodone. Oxymorphone is also available as a    scheduled prescription medication.   Pregabalin                     PRESENT      EXPECTED   Cyclobenzaprine                PRESENT      EXPECTED   Desmethylcyclobenzaprine       PRESENT      EXPECTED    Desmethylcyclobenzaprine is an expected metabolite of    cyclobenzaprine.   Bupropion                      PRESENT      EXPECTED   Hydroxybupropion               PRESENT      EXPECTED    Hydroxybupropion is an expected metabolite of bupropion.   Acetaminophen                  PRESENT      EXPECTED Drug Present not Declared for Prescription Verification   Diphenhydramine                PRESENT      UNEXPECTED Drug Absent but Declared for Prescription Verification   Hydrocodone                    Not Detected UNEXPECTED ng/mg creat   Citalopram                     Not Detected UNEXPECTED   Diclofenac                     Not Detected UNEXPECTED    Diclofenac, as indicated in the declared medication list, is not    always detected even when used as  directed.   Salicylate                     Not Detected UNEXPECTED  Aspirin, as indicated in the declared medication list, is not    always detected even when used as directed. ==================================================================== Test                      Result    Flag   Units      Ref Range   Creatinine              127              mg/dL      >=20 ==================================================================== Declared Medications:  The flagging and interpretation on this report are based on the  following declared medications.  Unexpected results may arise from  inaccuracies in the declared medications.  **Note: The testing scope of this panel includes these medications:  Bupropion (Wellbutrin)  Citalopram (Celexa)  Clonazepam (Klonopin)  Cyclobenzaprine (Flexeril)  Hydrocodone (Norco)  Oxycodone  Pregabalin (Lyrica)  **Note: The testing scope of this panel does not include small to  moderate amounts of these reported medications:  Acetaminophen (Norco)  Aspirin (Aspirin 81)  Diclofenac (Voltaren)  **Note: The testing scope of this panel does not include following  reported medications:  Famotidine (Pepcid)  Latanoprost (Xalatan)  Pantoprazole  Ranitidine (Zantac)  Simvastatin (Zocor) ==================================================================== For clinical consultation, please call [phone]. ====================================================================    UDS interpretation: Compliant          Medication Assessment Form: Reviewed. Patient indicates being compliant with therapy Treatment compliance: Compliant Risk Assessment Profile: Aberrant behavior: See prior evaluations. None observed or detected today Comorbid factors increasing risk of overdose: See prior notes. No additional risks detected today Risk of substance use disorder (SUD): Low Opioid Risk Tool - 12/04/17 1318      Family History of Substance Abuse    Alcohol  Negative    Illegal Drugs  Negative    Rx Drugs  Negative      Personal History of Substance Abuse   Alcohol  Negative    Illegal Drugs  Negative      Total Score   Opioid Risk Tool Scoring  0    Opioid Risk Interpretation  Low Risk      ORT Scoring interpretation table:  Score <3 = Low Risk for SUD  Score between 4-7 = Moderate Risk for SUD  Score >8 = High Risk for Opioid Abuse   Risk Mitigation Strategies:  Patient Counseling: Covered Patient-Prescriber Agreement (PPA): Present and active  Notification to other healthcare providers: Done  Pharmacologic Plan: Since the patient is not taking the oxycodone 3 times a day, we will cut down the prescription to twice a day to reflect where she actually takes.             Post-Procedure Assessment  11/21/2017 Procedure: Diagnostic right-sided T6-7 thoracic epidural steroid injection under fluoroscopic guidance and IV sedation Pre-procedure pain score:  7/10 Post-procedure pain score: 0/10 (100% relief) Influential Factors: BMI: 35.43 kg/m Intra-procedural challenges: None observed.         Assessment challenges: None detected.              Reported side-effects: None.        Post-procedural adverse reactions or complications: None reported         Sedation: Sedation provided. When no sedatives are used, the analgesic levels obtained are directly associated to the effectiveness of the local anesthetics. However, when sedation is provided, the level of analgesia obtained during the initial 1 hour following the intervention, is believed to  be the result of a combination of factors. These factors may include, but are not limited to: 1. The effectiveness of the local anesthetics used. 2. The effects of the analgesic(s) and/or anxiolytic(s) used. 3. The degree of discomfort experienced by the patient at the time of the procedure. 4. The patients ability and reliability in recalling and recording the events. 5. The presence and  influence of possible secondary gains and/or psychosocial factors. Reported result: Relief experienced during the 1st hour after the procedure: 100 % (Ultra-Short Term Relief)            Interpretative annotation: Clinically appropriate result. Analgesia during this period is likely to be Local Anesthetic and/or IV Sedative (Analgesic/Anxiolytic) related.          Effects of local anesthetic: The analgesic effects attained during this period are directly associated to the localized infiltration of local anesthetics and therefore cary significant diagnostic value as to the etiological location, or anatomical origin, of the pain. Expected duration of relief is directly dependent on the pharmacodynamics of the local anesthetic used. Long-acting (4-6 hours) anesthetics used.  Reported result: Relief during the next 4 to 6 hour after the procedure: 100 % (Short-Term Relief)            Interpretative annotation: Clinically appropriate result. Analgesia during this period is likely to be Local Anesthetic-related.          Long-term benefit: Defined as the period of time past the expected duration of local anesthetics (1 hour for short-acting and 4-6 hours for long-acting). With the possible exception of prolonged sympathetic blockade from the local anesthetics, benefits during this period are typically attributed to, or associated with, other factors such as analgesic sensory neuropraxia, antiinflammatory effects, or beneficial biochemical changes provided by agents other than the local anesthetics.  Reported result: Extended relief following procedure: 70 % (Long-Term Relief)            Interpretative annotation: Clinically appropriate result. Good relief. No permanent benefit expected. Inflammation plays a part in the etiology to the pain.          Current benefits: Defined as reported results that persistent at this point in time.   Analgesia: >75 % The patient indicates that most of the improvement has  been in the right flank area where she was experiencing the thoracic radiculitis.  However, she still having pain in the lower back which I believe is secondary to a lumbar facet syndrome. Function: Somewhat improved ROM: Somewhat improved Interpretative annotation: Ongoing benefit. No permanent benefit expected. Effective therapeutic approach.          Interpretation: Results would suggest a successful diagnostic intervention.                  Plan:  Since the patient's thoracic radiculitis has improved and her current concern is the low back pain, we will now switch to treating at this area.  Physical exam today has revealed that this is coming from the facet joints, bilaterally. We will schedule patient to return for a diagnostic lumbar facet block under fluoroscopic guidance and IV sedation.  Laboratory Chemistry  Inflammation Markers (CRP: Acute Phase) (ESR: Chronic Phase) Lab Results  Component Value Date   CRP <0.8 09/06/2017   ESRSEDRATE 18 09/06/2017                 Rheumatology Markers Lab Results  Component Value Date   RF <14 07/03/2017  Renal Function Markers Lab Results  Component Value Date   BUN 16 09/06/2017   CREATININE 0.67 09/06/2017   GFRAA >60 09/06/2017   GFRNONAA >60 09/06/2017                 Hepatic Function Markers Lab Results  Component Value Date   AST 26 09/06/2017   ALT 31 09/06/2017   ALBUMIN 4.8 09/06/2017   ALKPHOS 100 09/06/2017                 Electrolytes Lab Results  Component Value Date   NA 140 09/06/2017   K 4.0 09/06/2017   CL 104 09/06/2017   CALCIUM 10.0 09/06/2017   MG 2.0 07/03/2017                 Neuropathy Markers Lab Results  Component Value Date   VITAMINB12 475 09/06/2017                 Coagulation Parameters Lab Results  Component Value Date   PLT 197 07/03/2017                 Cardiovascular Markers Lab Results  Component Value Date   CKTOTAL 130 07/03/2017   HGB 14.0 07/03/2017    HCT 43.3 07/03/2017                 Note: Lab results reviewed.  Recent Diagnostic Imaging Results  DG C-Arm 1-60 Min-No Report Fluoroscopy was utilized by the requesting physician.  No radiographic  interpretation.   Complexity Note: Imaging results reviewed. Results shared with Ms. Aden, using Layman's terms.                         Meds   Current Outpatient Medications:  .  aspirin 81 MG tablet, Take 81 mg by mouth daily.  , Disp: , Rfl:  .  citalopram (CELEXA) 20 MG tablet, Take 20 mg by mouth daily.  , Disp: , Rfl:  .  clonazePAM (KLONOPIN) 1 MG tablet, Take 1 mg by mouth daily. , Disp: , Rfl:  .  ibuprofen (ADVIL,MOTRIN) 200 MG tablet, Take 400 mg by mouth every 6 (six) hours as needed., Disp: , Rfl:  .  latanoprost (XALATAN) 0.005 % ophthalmic solution, Place 1 drop into both eyes daily., Disp: , Rfl:  .  [START ON 12/13/2017] oxyCODONE (OXY IR/ROXICODONE) 5 MG immediate release tablet, Take 1 tablet (5 mg total) by mouth 2 (two) times daily as needed for severe pain., Disp: 60 tablet, Rfl: 0 .  pantoprazole (PROTONIX) 40 MG tablet, Take 40 mg by mouth daily.  , Disp: , Rfl:  .  ranitidine (ZANTAC) 150 MG tablet, Take 150 mg by mouth daily., Disp: , Rfl:  .  cyclobenzaprine (FLEXERIL) 10 MG tablet, Take 1 tablet (10 mg total) by mouth 2 (two) times daily as needed for muscle spasms., Disp: 60 tablet, Rfl: 0 .  [START ON 12/13/2017] LYRICA 100 MG capsule, Take 1 capsule (100 mg total) by mouth 2 (two) times daily., Disp: 60 capsule, Rfl: 0 .  Magnesium Oxide 500 MG CAPS, Take 1 capsule (500 mg total) by mouth 2 (two) times daily at 8 am and 10 pm., Disp: 60 capsule, Rfl: 0  ROS  Constitutional: Denies any fever or chills Gastrointestinal: No reported hemesis, hematochezia, vomiting, or acute GI distress Musculoskeletal: Denies any acute onset joint swelling, redness, loss of ROM, or weakness Neurological: No reported episodes of acute onset  apraxia, aphasia, dysarthria, agnosia,  amnesia, paralysis, loss of coordination, or loss of consciousness  Allergies  Ms. Lowder is allergic to asa [aspirin]; bupropion; and tape.  [place]  Drug: Ms. Helming  reports that she does not use drugs. Alcohol:  reports that she does not drink alcohol. Tobacco:  reports that she quit smoking about 2 years ago. Her smoking use included cigarettes. She has a 10.00 pack-year smoking history. She uses smokeless tobacco. Medical:  has a past medical history of Anxiety, Chronic chest pain, Depression, DJD (degenerative joint disease), Fibromyalgia, GERD (gastroesophageal reflux disease), and Hypercholesterolemia. Surgical: Ms. Limberg  has a past surgical history that includes Cholecystectomy; Cesarean section; and Back surgery (2008). Family: family history includes Arthritis in her father; Hypertension in her mother.  Constitutional Exam  General appearance: Well nourished, well developed, and well hydrated. In no apparent acute distress Vitals:   12/04/17 1310  BP: 133/80  Pulse: (!) 105  Resp: 16  Temp: 97.8 F (36.6 C)  SpO2: 99%  Weight: 200 lb (90.7 kg)  Height: '5\' 3"'$  (1.6 m)   BMI Assessment: Estimated body mass index is 35.43 kg/m as calculated from the following:   Height as of this encounter: '5\' 3"'$  (1.6 m).   Weight as of this encounter: 200 lb (90.7 kg).  BMI interpretation table: BMI level Category Range association with higher incidence of chronic pain  <18 kg/m2 Underweight   18.5-24.9 kg/m2 Ideal body weight   25-29.9 kg/m2 Overweight Increased incidence by 20%  30-34.9 kg/m2 Obese (Class I) Increased incidence by 68%  35-39.9 kg/m2 Severe obesity (Class II) Increased incidence by 136%  >40 kg/m2 Extreme obesity (Class III) Increased incidence by 254%   BMI Readings from Last 4 Encounters:  12/04/17 35.43 kg/m  11/21/17 35.43 kg/m  11/13/17 35.43 kg/m  09/06/17 35.43 kg/m   Wt Readings from Last 4 Encounters:  12/04/17 200 lb (90.7 kg)  11/21/17 200 lb  (90.7 kg)  11/13/17 200 lb (90.7 kg)  09/06/17 200 lb (90.7 kg)  Psych/Mental status: Alert, oriented x 3 (person, place, & time)       Eyes: PERLA Respiratory: No evidence of acute respiratory distress  Cervical Spine Area Exam  Skin & Axial Inspection: No masses, redness, edema, swelling, or associated skin lesions Alignment: Symmetrical Functional ROM: Unrestricted ROM      Stability: No instability detected Muscle Tone/Strength: Functionally intact. No obvious neuro-muscular anomalies detected. Sensory (Neurological): Unimpaired Palpation: No palpable anomalies              Upper Extremity (UE) Exam    Side: Right upper extremity  Side: Left upper extremity  Skin & Extremity Inspection: Skin color, temperature, and hair growth are WNL. No peripheral edema or cyanosis. No masses, redness, swelling, asymmetry, or associated skin lesions. No contractures.  Skin & Extremity Inspection: Skin color, temperature, and hair growth are WNL. No peripheral edema or cyanosis. No masses, redness, swelling, asymmetry, or associated skin lesions. No contractures.  Functional ROM: Unrestricted ROM          Functional ROM: Unrestricted ROM          Muscle Tone/Strength: Functionally intact. No obvious neuro-muscular anomalies detected.  Muscle Tone/Strength: Functionally intact. No obvious neuro-muscular anomalies detected.  Sensory (Neurological): Unimpaired          Sensory (Neurological): Unimpaired          Palpation: No palpable anomalies              Palpation: No  palpable anomalies              Specialized Test(s): Deferred         Specialized Test(s): Deferred          Thoracic Spine Area Exam  Skin & Axial Inspection: No masses, redness, or swelling Alignment: Symmetrical Functional ROM: Unrestricted ROM Stability: No instability detected Muscle Tone/Strength: Functionally intact. No obvious neuro-muscular anomalies detected. Sensory (Neurological): Unimpaired Muscle strength & Tone: No  palpable anomalies  Lumbar Spine Area Exam  Skin & Axial Inspection: No masses, redness, or swelling Alignment: Symmetrical Functional ROM: Minimal ROM, bilaterally Stability: No instability detected Muscle Tone/Strength: Increased muscle tone over affected area Sensory (Neurological): Movement-associated pain Palpation: Complains of area being tender to palpation       Provocative Tests: Lumbar Hyperextension and rotation test: Positive bilaterally for facet joint pain. Lumbar Lateral bending test: evaluation deferred today       Patrick's Maneuver: evaluation deferred today                    Gait & Posture Assessment  Ambulation: Unassisted Gait: Relatively normal for age and body habitus Posture: WNL   Lower Extremity Exam    Side: Right lower extremity  Side: Left lower extremity  Skin & Extremity Inspection: Skin color, temperature, and hair growth are WNL. No peripheral edema or cyanosis. No masses, redness, swelling, asymmetry, or associated skin lesions. No contractures.  Skin & Extremity Inspection: Skin color, temperature, and hair growth are WNL. No peripheral edema or cyanosis. No masses, redness, swelling, asymmetry, or associated skin lesions. No contractures.  Functional ROM: Unrestricted ROM          Functional ROM: Unrestricted ROM          Muscle Tone/Strength: Functionally intact. No obvious neuro-muscular anomalies detected.  Muscle Tone/Strength: Functionally intact. No obvious neuro-muscular anomalies detected.  Sensory (Neurological): Unimpaired  Sensory (Neurological): Unimpaired  Palpation: No palpable anomalies  Palpation: No palpable anomalies   Assessment  Primary Diagnosis & Pertinent Problem List: The primary encounter diagnosis was Chronic upper back pain (Primary Area of Pain) (Bilateral) (R>L). Diagnoses of Thoracic radiculitis (T6/T7) (Right), Chronic low back pain  (Secondary Area of Pain) (Bilateral) (R>L), Lumbar facet syndrome (Bilateral) (R>L),  Lumbar paraspinal muscle spasm, Thoracic radiculopathy due to osteoarthritis of spine, DISH (diffuse idiopathic skeletal hyperostosis), Chronic musculoskeletal pain, Muscle spasms of both lower extremities, Chronic pain syndrome, Neurogenic pain, Fibromyalgia, and Pharmacologic therapy were also pertinent to this visit.  Status Diagnosis  Improving Improved Persistent 1. Chronic upper back pain (Primary Area of Pain) (Bilateral) (R>L)   2. Thoracic radiculitis (T6/T7) (Right)   3. Chronic low back pain  (Secondary Area of Pain) (Bilateral) (R>L)   4. Lumbar facet syndrome (Bilateral) (R>L)   5. Lumbar paraspinal muscle spasm   6. Thoracic radiculopathy due to osteoarthritis of spine   7. DISH (diffuse idiopathic skeletal hyperostosis)   8. Chronic musculoskeletal pain   9. Muscle spasms of both lower extremities   10. Chronic pain syndrome   11. Neurogenic pain   12. Fibromyalgia   13. Pharmacologic therapy     Problems updated and reviewed during this visit: Problem  Lumbar facet syndrome (Bilateral) (R>L)  Lumbar Paraspinal Muscle Spasm   Plan of Care  Pharmacotherapy (Medications Ordered): Meds ordered this encounter  Medications  . cyclobenzaprine (FLEXERIL) 10 MG tablet    Sig: Take 1 tablet (10 mg total) by mouth 2 (two) times daily as needed for muscle  spasms.    Dispense:  60 tablet    Refill:  0    Do not place medication on "Automatic Refill". Fill one day early if pharmacy is closed on scheduled refill date.  [name] LYRICA 100 MG capsule    Sig: Take 1 capsule (100 mg total) by mouth 2 (two) times daily.    Dispense:  60 capsule    Refill:  0    Do not place medication on "Automatic Refill". Fill one day early if pharmacy is closed on scheduled refill date.  [name] oxyCODONE (OXY IR/ROXICODONE) 5 MG immediate release tablet    Sig: Take 1 tablet (5 mg total) by mouth 2 (two) times daily as needed for severe pain.    Dispense:  60 tablet    Refill:  0    Do not place this  medication, or any other prescription from our practice, on "Automatic Refill". Patient may have prescription filled one day early if pharmacy is closed on scheduled refill date. Do not fill until: 12/13/2017 To last until: 01/12/18  . Magnesium Oxide 500 MG CAPS    Sig: Take 1 capsule (500 mg total) by mouth 2 (two) times daily at 8 am and 10 pm.    Dispense:  60 capsule    Refill:  0    Do not place medication on "Automatic Refill". Fill one day early if pharmacy is closed on scheduled refill date.   Medications administered today: Shyrl Numbers. Mankin had no medications administered during this visit.   Procedure Orders     LUMBAR FACET(MEDIAL BRANCH NERVE BLOCK) MBNB Lab Orders  No laboratory test(s) ordered today   Imaging Orders  No imaging studies ordered today   Referral Orders  No referral(s) requested today    Interventional management options: Planned, scheduled, and/or pending:   Diagnostic bilateral lumbar facet block under fluoroscopic guidance and IV sedation    Considering:   Diagnostic Trigger point injections Diagnostic bilateral LESI Diagnostic bilateral lumbar facet nerve block Possiblebilateral lumbar facet RFA Possiblebilateral Racz(Epidural Neurolysis) Diagnostic thoracic facet nerve block Possible thoracic facet RFA Diagnostic bilateral CESI Diagnosticbilateral cervical facet nerve block Possiblebilateral cervical facet RFA Diagnostic right shoulder intra-articular injection Diagnostic right suprascapular nerve block Possibleright suprascapular RFA Diagnostic bilateral genicular nerve block Possible bilateral genicular RFA   Palliative PRN treatment(s):   None at this time   Provider-requested follow-up: Return for Procedure (w/ sedation): (B) L-FCT Blk.  Future Appointments  Date Time Provider [place]  08/24/2018  9:15 AM Bo Merino, MD [facility] None   Primary Care Physician: Glenda Chroman, MD Location: [facility] [place] [facility]  [facility] Management Facility Note by: Gaspar Cola, MD Date: 12/04/2017; Time: 2:17 PM

## 2017-12-04 NOTE — Progress Notes (Deleted)
Safety precautions to be maintained throughout the outpatient stay will include: orient to surroundings, keep bed in low position, maintain call bell within reach at all times, provide assistance with transfer out of bed and ambulation.  

## 2017-12-14 ENCOUNTER — Other Ambulatory Visit: Payer: Self-pay

## 2017-12-14 ENCOUNTER — Encounter: Payer: Self-pay | Admitting: Pain Medicine

## 2017-12-14 ENCOUNTER — Ambulatory Visit (HOSPITAL_BASED_OUTPATIENT_CLINIC_OR_DEPARTMENT_OTHER): Payer: PPO | Admitting: Pain Medicine

## 2017-12-14 ENCOUNTER — Ambulatory Visit
Admission: RE | Admit: 2017-12-14 | Discharge: 2017-12-14 | Disposition: A | Discharge status: Home / Self Care | Payer: PPO | Source: Ambulatory Visit | Attending: Pain Medicine | Admitting: Pain Medicine

## 2017-12-14 VITALS — BP 108/60 | HR 89 | Temp 97.0°F | Resp 20 | Ht 63.0 in | Wt 190.0 lb

## 2017-12-14 DIAGNOSIS — Z79899 Other long term (current) drug therapy: Secondary | ICD-10-CM | POA: Diagnosis not present

## 2017-12-14 DIAGNOSIS — M47816 Spondylosis without myelopathy or radiculopathy, lumbar region: Secondary | ICD-10-CM

## 2017-12-14 DIAGNOSIS — G8929 Other chronic pain: Secondary | ICD-10-CM | POA: Diagnosis not present

## 2017-12-14 DIAGNOSIS — Z9049 Acquired absence of other specified parts of digestive tract: Secondary | ICD-10-CM | POA: Insufficient documentation

## 2017-12-14 DIAGNOSIS — F419 Anxiety disorder, unspecified: Secondary | ICD-10-CM | POA: Diagnosis not present

## 2017-12-14 DIAGNOSIS — Z7982 Long term (current) use of aspirin: Secondary | ICD-10-CM | POA: Insufficient documentation

## 2017-12-14 DIAGNOSIS — Z888 Allergy status to other drugs, medicaments and biological substances status: Secondary | ICD-10-CM | POA: Insufficient documentation

## 2017-12-14 DIAGNOSIS — M5442 Lumbago with sciatica, left side: Secondary | ICD-10-CM | POA: Diagnosis not present

## 2017-12-14 DIAGNOSIS — M542 Cervicalgia: Secondary | ICD-10-CM | POA: Diagnosis present

## 2017-12-14 DIAGNOSIS — Z886 Allergy status to analgesic agent status: Secondary | ICD-10-CM | POA: Insufficient documentation

## 2017-12-14 DIAGNOSIS — M488X6 Other specified spondylopathies, lumbar region: Secondary | ICD-10-CM | POA: Insufficient documentation

## 2017-12-14 DIAGNOSIS — M5441 Lumbago with sciatica, right side: Secondary | ICD-10-CM | POA: Insufficient documentation

## 2017-12-14 DIAGNOSIS — G4733 Obstructive sleep apnea (adult) (pediatric): Secondary | ICD-10-CM | POA: Diagnosis not present

## 2017-12-14 MED ORDER — ROPIVACAINE HCL 2 MG/ML IJ SOLN
9.0000 mL | Freq: Once | INTRAMUSCULAR | Status: AC
Start: 2017-12-14 — End: 2017-12-14
  Administered 2017-12-14: 9 mL via PERINEURAL
  Filled 2017-12-14: qty 10

## 2017-12-14 MED ORDER — LIDOCAINE HCL 2 % IJ SOLN
10.0000 mL | Freq: Once | INTRAMUSCULAR | Status: AC
  Administered 2017-12-14: 400 mg
  Filled 2017-12-14: qty 100

## 2017-12-14 MED ORDER — TRIAMCINOLONE ACETONIDE 40 MG/ML IJ SUSP
40.0000 mg | Freq: Once | INTRAMUSCULAR | Status: AC
  Administered 2017-12-14: 40 mg
  Filled 2017-12-14: qty 1

## 2017-12-14 MED ORDER — ROPIVACAINE HCL 2 MG/ML IJ SOLN
9.0000 mL | Freq: Once | INTRAMUSCULAR | Status: AC
  Administered 2017-12-14: 9 mL via PERINEURAL
  Filled 2017-12-14: qty 10

## 2017-12-14 MED ORDER — TRIAMCINOLONE ACETONIDE 40 MG/ML IJ SUSP
40.0000 mg | Freq: Once | INTRAMUSCULAR | Status: AC
Start: 2017-12-14 — End: 2017-12-14
  Administered 2017-12-14: 40 mg
  Filled 2017-12-14: qty 1

## 2017-12-14 MED ORDER — MIDAZOLAM HCL 5 MG/5ML IJ SOLN
1.0000 mg | INTRAMUSCULAR | Status: DC | PRN
  Administered 2017-12-14: 3.5 mg via INTRAVENOUS
  Filled 2017-12-14: qty 5

## 2017-12-14 MED ORDER — LACTATED RINGERS IV SOLN
1000.0000 mL | Freq: Once | INTRAVENOUS | Status: AC
  Administered 2017-12-14: 1000 mL via INTRAVENOUS

## 2017-12-14 MED ORDER — FENTANYL CITRATE (PF) 100 MCG/2ML IJ SOLN
25.0000 ug | INTRAMUSCULAR | Status: DC | PRN
  Administered 2017-12-14: 100 ug via INTRAVENOUS
  Filled 2017-12-14: qty 2

## 2017-12-14 NOTE — Progress Notes (Signed)
Safety precautions to be maintained throughout the outpatient stay will include: orient to surroundings, keep bed in low position, maintain call bell within reach at all times, provide assistance with transfer out of bed and ambulation.  

## 2017-12-14 NOTE — Patient Instructions (Signed)

## 2017-12-14 NOTE — Progress Notes (Signed)
Patient's Name: Jennifer Harmon  MRN: [id number]  Referring Provider: Ignatius Specking, MD  DOB: 26-Sep-1961  PCP: Ignatius Specking, MD  DOS: 12/14/2017  Note by: Oswaldo Done, MD  Service setting: Ambulatory outpatient  Specialty: Interventional Pain Management  Patient type: Established  Location: [facility] (AMB) Pain Management Facility  Visit type: Interventional Procedure   Primary Reason for Visit: Interventional Pain Management Treatment. CC: Back Pain (low back and buttocks)  Procedure:  Anesthesia, Analgesia, Anxiolysis:  Type: Medial Branch Facet Block  #1  Primary Purpose: Diagnostic Region: Lumbar Level:  L2, L3, L4, L5, & S1 Medial Branch Level(s) Target Area: For Lumbar Facet blocks, the target is the groove formed by the junction of the transverse process and superior articular process. For the L5 dorsal ramus, the target is the notch between superior articular process and sacral ala. For the S1 dorsal ramus, the target is the superior and lateral edge of the posterior S1 Sacral foramen. Approach: Posterior, paramedial, percutaneous approach. Laterality: Bilateral Position: Prone  Type: Local Anesthesia with Moderate (Conscious) Sedation Local Anesthetic: Lidocaine 1% Route: Intravenous (IV) IV Access: Secured Sedation: Meaningful verbal contact was maintained at all times during the procedure  Indication(s): Analgesia and Anxiety   Indications: 1. Lumbar facet syndrome (Bilateral) (R>L)   2. Chronic low back pain  (Secondary Area of Pain) (Bilateral) (R>L)   3. Facet syndrome, lumbar   4. Chronic bilateral low back pain with bilateral sciatica    Pain Score: Pre-procedure: 8 /10 Post-procedure: 5 /10  Pre-op Assessment:  Ms. Leithead is a 57 y.o. (year old), female patient, seen today for interventional treatment. She  has a past surgical history that includes Cholecystectomy; Cesarean section; and Back surgery (2008). Ms. Crocker has a current medication list which includes  the following prescription(s): aspirin, citalopram, clonazepam, cyclobenzaprine, ibuprofen, latanoprost, lyrica, magnesium oxide, oxycodone, pantoprazole, and ranitidine, and the following Facility-Administered Medications: fentanyl and midazolam. Her primarily concern today is the Back Pain (low back and buttocks)  Initial Vital Signs:  Pulse Rate: 89 Temp: 98 F (36.7 C) Resp: 16 BP: (!) 122/57 SpO2: 100 %  BMI: Estimated body mass index is 33.66 kg/m as calculated from the following:   Height as of this encounter: 5\' 3"  (1.6 m).   Weight as of this encounter: 190 lb (86.2 kg).  Risk Assessment: Allergies: Reviewed. She is allergic to asa [aspirin]; bupropion; and tape.  Allergy Precautions: None required Coagulopathies: Reviewed. None identified.  Blood-thinner therapy: None at this time Active Infection(s): Reviewed. None identified. Ms. Kagel is afebrile  Site Confirmation: Ms. Kozloski was asked to confirm the procedure and laterality before marking the site Procedure checklist: Completed Consent: Before the procedure and under the influence of no sedative(s), amnesic(s), or anxiolytics, the patient was informed of the treatment options, risks and possible complications. To fulfill our ethical and legal obligations, as recommended by the American Medical Association's Code of Ethics, I have informed the patient of my clinical impression; the nature and purpose of the treatment or procedure; the risks, benefits, and possible complications of the intervention; the alternatives, including doing nothing; the risk(s) and benefit(s) of the alternative treatment(s) or procedure(s); and the risk(s) and benefit(s) of doing nothing. The patient was provided information about the general risks and possible complications associated with the procedure. These may include, but are not limited to: failure to achieve desired goals, infection, bleeding, organ or nerve damage, allergic reactions,  paralysis, and death. In addition, the patient was informed of  those risks and complications associated to Spine-related procedures, such as failure to decrease pain; infection (i.e.: Meningitis, epidural or intraspinal abscess); bleeding (i.e.: epidural hematoma, subarachnoid hemorrhage, or any other type of intraspinal or peri-dural bleeding); organ or nerve damage (i.e.: Any type of peripheral nerve, nerve root, or spinal cord injury) with subsequent damage to sensory, motor, and/or autonomic systems, resulting in permanent pain, numbness, and/or weakness of one or several areas of the body; allergic reactions; (i.e.: anaphylactic reaction); and/or death. Furthermore, the patient was informed of those risks and complications associated with the medications. These include, but are not limited to: allergic reactions (i.e.: anaphylactic or anaphylactoid reaction(s)); adrenal axis suppression; blood sugar elevation that in diabetics may result in ketoacidosis or comma; water retention that in patients with history of congestive heart failure may result in shortness of breath, pulmonary edema, and decompensation with resultant heart failure; weight gain; swelling or edema; medication-induced neural toxicity; particulate matter embolism and blood vessel occlusion with resultant organ, and/or nervous system infarction; and/or aseptic necrosis of one or more joints. Finally, the patient was informed that Medicine is not an exact science; therefore, there is also the possibility of unforeseen or unpredictable risks and/or possible complications that may result in a catastrophic outcome. The patient indicated having understood very clearly. We have given the patient no guarantees and we have made no promises. Enough time was given to the patient to ask questions, all of which were answered to the patient's satisfaction. Ms. Matchett has indicated that she wanted to continue with the procedure. Attestation: I, the  ordering provider, attest that I have discussed with the patient the benefits, risks, side-effects, alternatives, likelihood of achieving goals, and potential problems during recovery for the procedure that I have provided informed consent. Date  Time: 12/14/2017  7:49 AM   Pre-Procedure Preparation:  Monitoring: As per clinic protocol. Respiration, ETCO2, SpO2, BP, heart rate and rhythm monitor placed and checked for adequate function Safety Precautions: Patient was assessed for positional comfort and pressure points before starting the procedure. Time-out: I initiated and conducted the "Time-out" before starting the procedure, as per protocol. The patient was asked to participate by confirming the accuracy of the "Time Out" information. Verification of the correct person, site, and procedure were performed and confirmed by me, the nursing staff, and the patient. "Time-out" conducted as per Joint Commission's Universal Protocol (UP.01.01.01). "Time-out" Date & Time: 12/14/2017; 0846 hrs.  Description of Procedure Process:   Area Prepped: Entire Posterior Lumbosacral Region Prepping solution: ChloraPrep (2% chlorhexidine gluconate and 70% isopropyl alcohol) Safety Precautions: Aspiration looking for blood return was conducted prior to all injections. At no point did we inject any substances, as a needle was being advanced. No attempts were made at seeking any paresthesias. Safe injection practices and needle disposal techniques used. Medications properly checked for expiration dates. SDV (single dose vial) medications used. Description of the Procedure: Protocol guidelines were followed. The patient was placed in position over the fluoroscopy table. The target area was identified and the area prepped in the usual manner. Skin desensitized using vapocoolant spray. Skin & deeper tissues infiltrated with local anesthetic. Appropriate amount of time allowed to pass for local anesthetics to take effect. The  procedure needle was introduced through the skin, ipsilateral to the reported pain, and advanced to the target area. Employing the "Medial Branch Technique", the needles were advanced to the angle made by the superior and medial portion of the transverse process, and the lateral and inferior portion of the  superior articulating process of the targeted vertebral bodies. This area is known as "Burton's Eye" or the "Eye of the [place] Dog". A procedure needle was introduced through the skin, and this time advanced to the angle made by the superior and medial border of the sacral ala, and the lateral border of the S1 vertebral body. This last needle was later repositioned at the superior and lateral border of the posterior S1 foramen. Negative aspiration confirmed. Solution injected in intermittent fashion, asking for systemic symptoms every 0.5cc of injectate. The needles were then removed and the area cleansed, making sure to leave some of the prepping solution back to take advantage of its long term bactericidal properties.   Illustration of the posterior view of the lumbar spine and the posterior neural structures. Laminae of L2 through S1 are labeled. DPRL5, dorsal primary ramus of L5; DPRS1, dorsal primary ramus of S1; DPR3, dorsal primary ramus of L3; FJ, facet (zygapophyseal) joint L3-L4; I, inferior articular process of L4; LB1, lateral branch of dorsal primary ramus of L1; IAB, inferior articular branches from L3 medial branch (supplies L4-L5 facet joint); IBP, intermediate branch plexus; MB3, medial branch of dorsal primary ramus of L3; NR3, third lumbar nerve root; S, superior articular process of L5; SAB, superior articular branches from L4 (supplies L4-5 facet joint also); TP3, transverse process of L3.  Vitals:   12/14/17 0908 12/14/17 0917 12/14/17 0927 12/14/17 0937  BP: 134/81 127/72 128/90 108/60  Pulse:      Resp: 11 16 13 20   Temp:  (!) 97 F (36.1 C)    TempSrc:  Temporal    SpO2: 96%  97% 98% 96%  Weight:      Height:        Start Time: 0846 hrs. End Time: 0908 hrs. Materials:  Needle(s) Type: Regular needle Gauge: 22G Length: 3.5-in Medication(s): Please see orders for medications and dosing details.  Imaging Guidance (Spinal):  Type of Imaging Technique: Fluoroscopy Guidance (Spinal) Indication(s): Assistance in needle guidance and placement for procedures requiring needle placement in or near specific anatomical locations not easily accessible without such assistance. Exposure Time: Please see nurses notes. Contrast: None used. Fluoroscopic Guidance: I was personally present during the use of fluoroscopy. "Tunnel Vision Technique" used to obtain the best possible view of the target area. Parallax error corrected before commencing the procedure. "Direction-depth-direction" technique used to introduce the needle under continuous pulsed fluoroscopy. Once target was reached, antero-posterior, oblique, and lateral fluoroscopic projection used confirm needle placement in all planes. Images permanently stored in EMR. Interpretation: No contrast injected. I personally interpreted the imaging intraoperatively. Adequate needle placement confirmed in multiple planes. Permanent images saved into the patient's record.  Antibiotic Prophylaxis:   Antibiotics Given (last 72 hours)    None     Indication(s): None identified  Post-operative Assessment:  Post-procedure Vital Signs:  Pulse Rate: 89 Temp: (!) 97 F (36.1 C) Resp: 20 BP: 108/60 SpO2: 96 %  EBL: None  Complications: No immediate post-treatment complications observed by team, or reported by patient.  Note: The patient tolerated the entire procedure well. A repeat set of vitals were taken after the procedure and the patient was kept under observation following institutional policy, for this type of procedure. Post-procedural neurological assessment was performed, showing return to baseline, prior to discharge.  The patient was provided with post-procedure discharge instructions, including a section on how to identify potential problems. Should any problems arise concerning this procedure, the patient was given instructions to immediately contact  us, at any time, without hesitation. In any case, we plan to contact the patient by telephone for a follow-up status report regarding this interventional procedure.  Comments:  No additional relevant information.  Plan of Care    Imaging Orders     DG C-Arm 1-60 Min-No Report  Procedure Orders     LUMBAR FACET(MEDIAL BRANCH NERVE BLOCK) MBNB  Medications ordered for procedure: Meds ordered this encounter  Medications  . midazolam (VERSED) 5 MG/5ML injection 1-2 mg    Make sure Flumazenil is available in the pyxis when using this medication. If oversedation occurs, administer 0.2 mg IV over 15 sec. If after 45 sec no response, administer 0.2 mg again over 1 min; may repeat at 1 min intervals; not to exceed 4 doses (1 mg)  . fentaNYL (SUBLIMAZE) injection 25-50 mcg    Make sure Narcan is available in the pyxis when using this medication. In the event of respiratory depression (RR< 8/min): Titrate NARCAN (naloxone) in increments of 0.1 to 0.2 mg IV at 2-3 minute intervals, until desired degree of reversal.  . lactated ringers infusion 1,000 mL  . lidocaine (XYLOCAINE) 2 % (with pres) injection 200 mg  . ropivacaine (PF) 2 mg/mL (0.2%) (NAROPIN) injection 9 mL  . triamcinolone acetonide (KENALOG-40) injection 40 mg  . ropivacaine (PF) 2 mg/mL (0.2%) (NAROPIN) injection 9 mL  . triamcinolone acetonide (KENALOG-40) injection 40 mg   Medications administered: We administered midazolam, fentaNYL, lactated ringers, lidocaine, ropivacaine (PF) 2 mg/mL (0.2%), triamcinolone acetonide, ropivacaine (PF) 2 mg/mL (0.2%), and triamcinolone acetonide.  See the medical record for exact dosing, route, and time of administration.  New Prescriptions   No medications on  file   Disposition: Discharge home  Discharge Date & Time: 12/14/2017; 0944 hrs.   Physician-requested Follow-up: Return for post-procedure eval (2 wks), w/ Dr. Laban Emperor.  Future Appointments  Date Time Provider Department Center  01/01/2018  9:15 AM Delano Metz, MD [facility] None  08/24/2018  9:15 AM Pollyann Savoy, MD [facility] None   Primary Care Physician: Ignatius Specking, MD Location: [facility] Management Facility Note by: Oswaldo Done, MD Date: 12/14/2017; Time: 10:39 AM  Disclaimer:  Medicine is not an exact science. The only guarantee in medicine is that nothing is guaranteed. It is important to note that the decision to proceed with this intervention was based on the information collected from the patient. The Data and conclusions were drawn from the patient's questionnaire, the interview, and the physical examination. Because the information was provided in large part by the patient, it cannot be guaranteed that it has not been purposely or unconsciously manipulated. Every effort has been made to obtain as much relevant data as possible for this evaluation. It is important to note that the conclusions that lead to this procedure are derived in large part from the available data. Always take into account that the treatment will also be dependent on availability of resources and existing treatment guidelines, considered by other Pain Management Practitioners as being common knowledge and practice, at the time of the intervention. For Medico-Legal purposes, it is also important to point out that variation in procedural techniques and pharmacological choices are the acceptable norm. The indications, contraindications, technique, and results of the above procedure should only be interpreted and judged by a Board-Certified Interventional Pain Specialist with extensive familiarity and expertise in the same exact procedure and technique.

## 2017-12-15 ENCOUNTER — Telehealth: Payer: Self-pay

## 2017-12-15 NOTE — Telephone Encounter (Signed)
Post procedure phone call.  Patient states she is doing fine.  

## 2017-12-25 DIAGNOSIS — Z6834 Body mass index (BMI) 34.0-34.9, adult: Secondary | ICD-10-CM | POA: Diagnosis not present

## 2017-12-25 DIAGNOSIS — J069 Acute upper respiratory infection, unspecified: Secondary | ICD-10-CM | POA: Diagnosis not present

## 2017-12-25 DIAGNOSIS — E785 Hyperlipidemia, unspecified: Secondary | ICD-10-CM | POA: Diagnosis not present

## 2017-12-25 DIAGNOSIS — Z87891 Personal history of nicotine dependence: Secondary | ICD-10-CM | POA: Diagnosis not present

## 2017-12-25 DIAGNOSIS — I10 Essential (primary) hypertension: Secondary | ICD-10-CM | POA: Diagnosis not present

## 2017-12-25 DIAGNOSIS — Z299 Encounter for prophylactic measures, unspecified: Secondary | ICD-10-CM | POA: Diagnosis not present

## 2018-01-01 ENCOUNTER — Ambulatory Visit: Payer: Self-pay | Admitting: Pain Medicine

## 2018-01-03 ENCOUNTER — Encounter: Payer: Self-pay | Admitting: Pain Medicine

## 2018-01-03 ENCOUNTER — Ambulatory Visit: Payer: PPO | Attending: Pain Medicine | Admitting: Pain Medicine

## 2018-01-03 ENCOUNTER — Other Ambulatory Visit: Payer: Self-pay

## 2018-01-03 ENCOUNTER — Ambulatory Visit
Admission: RE | Admit: 2018-01-03 | Discharge: 2018-01-03 | Disposition: A | Discharge status: Home / Self Care | Payer: PPO | Source: Ambulatory Visit | Attending: Pain Medicine | Admitting: Pain Medicine

## 2018-01-03 VITALS — BP 138/67 | HR 85 | Resp 18 | Ht 63.0 in | Wt 205.0 lb

## 2018-01-03 DIAGNOSIS — M792 Neuralgia and neuritis, unspecified: Secondary | ICD-10-CM

## 2018-01-03 DIAGNOSIS — M79651 Pain in right thigh: Secondary | ICD-10-CM | POA: Diagnosis not present

## 2018-01-03 DIAGNOSIS — M47816 Spondylosis without myelopathy or radiculopathy, lumbar region: Secondary | ICD-10-CM | POA: Diagnosis not present

## 2018-01-03 DIAGNOSIS — M549 Dorsalgia, unspecified: Secondary | ICD-10-CM

## 2018-01-03 DIAGNOSIS — G8929 Other chronic pain: Secondary | ICD-10-CM

## 2018-01-03 DIAGNOSIS — R252 Cramp and spasm: Secondary | ICD-10-CM | POA: Insufficient documentation

## 2018-01-03 DIAGNOSIS — Z888 Allergy status to other drugs, medicaments and biological substances status: Secondary | ICD-10-CM | POA: Insufficient documentation

## 2018-01-03 DIAGNOSIS — M7918 Myalgia, other site: Secondary | ICD-10-CM | POA: Diagnosis not present

## 2018-01-03 DIAGNOSIS — M533 Sacrococcygeal disorders, not elsewhere classified: Secondary | ICD-10-CM | POA: Insufficient documentation

## 2018-01-03 DIAGNOSIS — Z5181 Encounter for therapeutic drug level monitoring: Secondary | ICD-10-CM | POA: Diagnosis not present

## 2018-01-03 DIAGNOSIS — F329 Major depressive disorder, single episode, unspecified: Secondary | ICD-10-CM | POA: Diagnosis not present

## 2018-01-03 DIAGNOSIS — M5442 Lumbago with sciatica, left side: Secondary | ICD-10-CM

## 2018-01-03 DIAGNOSIS — F419 Anxiety disorder, unspecified: Secondary | ICD-10-CM | POA: Insufficient documentation

## 2018-01-03 DIAGNOSIS — E7800 Pure hypercholesterolemia, unspecified: Secondary | ICD-10-CM | POA: Insufficient documentation

## 2018-01-03 DIAGNOSIS — Z79891 Long term (current) use of opiate analgesic: Secondary | ICD-10-CM | POA: Insufficient documentation

## 2018-01-03 DIAGNOSIS — M25551 Pain in right hip: Principal | ICD-10-CM

## 2018-01-03 DIAGNOSIS — M797 Fibromyalgia: Secondary | ICD-10-CM

## 2018-01-03 DIAGNOSIS — K6289 Other specified diseases of anus and rectum: Secondary | ICD-10-CM | POA: Diagnosis not present

## 2018-01-03 DIAGNOSIS — M545 Low back pain: Secondary | ICD-10-CM | POA: Diagnosis not present

## 2018-01-03 DIAGNOSIS — M62838 Other muscle spasm: Secondary | ICD-10-CM

## 2018-01-03 DIAGNOSIS — M25552 Pain in left hip: Secondary | ICD-10-CM | POA: Diagnosis not present

## 2018-01-03 DIAGNOSIS — M47817 Spondylosis without myelopathy or radiculopathy, lumbosacral region: Secondary | ICD-10-CM

## 2018-01-03 DIAGNOSIS — M47819 Spondylosis without myelopathy or radiculopathy, site unspecified: Secondary | ICD-10-CM | POA: Insufficient documentation

## 2018-01-03 DIAGNOSIS — M4810 Ankylosing hyperostosis [Forestier], site unspecified: Secondary | ICD-10-CM

## 2018-01-03 DIAGNOSIS — Z886 Allergy status to analgesic agent status: Secondary | ICD-10-CM | POA: Insufficient documentation

## 2018-01-03 DIAGNOSIS — Z7982 Long term (current) use of aspirin: Secondary | ICD-10-CM | POA: Diagnosis not present

## 2018-01-03 DIAGNOSIS — K219 Gastro-esophageal reflux disease without esophagitis: Secondary | ICD-10-CM | POA: Diagnosis not present

## 2018-01-03 DIAGNOSIS — M13851 Other specified arthritis, right hip: Secondary | ICD-10-CM | POA: Insufficient documentation

## 2018-01-03 DIAGNOSIS — Z87891 Personal history of nicotine dependence: Secondary | ICD-10-CM | POA: Diagnosis not present

## 2018-01-03 DIAGNOSIS — Z79899 Other long term (current) drug therapy: Secondary | ICD-10-CM | POA: Insufficient documentation

## 2018-01-03 DIAGNOSIS — G894 Chronic pain syndrome: Secondary | ICD-10-CM

## 2018-01-03 DIAGNOSIS — M5388 Other specified dorsopathies, sacral and sacrococcygeal region: Secondary | ICD-10-CM

## 2018-01-03 DIAGNOSIS — M5441 Lumbago with sciatica, right side: Secondary | ICD-10-CM

## 2018-01-03 MED ORDER — MAGNESIUM OXIDE -MG SUPPLEMENT 500 MG PO CAPS: 1.0000 | ORAL_CAPSULE | Freq: Two times a day (BID) | ORAL | 2 refills | Status: DC

## 2018-01-03 MED ORDER — OXYCODONE HCL 5 MG PO TABS: 5.0000 mg | ORAL_TABLET | Freq: Two times a day (BID) | ORAL | 0 refills | Status: DC | PRN

## 2018-01-03 MED ORDER — CYCLOBENZAPRINE HCL 10 MG PO TABS: 10.0000 mg | ORAL_TABLET | Freq: Two times a day (BID) | ORAL | 2 refills | Status: DC | PRN

## 2018-01-03 MED ORDER — LYRICA 100 MG PO CAPS: 100.0000 mg | ORAL_CAPSULE | Freq: Two times a day (BID) | ORAL | 2 refills | Status: DC

## 2018-01-03 NOTE — Patient Instructions (Addendum)
_____You have been given 4 prescriptions today.  3 for oxycodone and 1 for Lyrica.  You also have scripts to be picked up from the pharmacy.  You have been given pre procedure instructions as well.  You have been instructed to get xrays today.  _______________________________________________________________________________________  Preparing for Procedure with Sedation  Instructions: . Oral Intake: Do not eat or drink anything for at least 8 hours prior to your procedure. . Transportation: Public transportation is not allowed. Bring an adult driver. The driver must be physically present in our waiting room before any procedure can be started. [name] Physical Assistance: Bring an adult physically capable of assisting you, in the event you need help. This adult should keep you company at home for at least 6 hours after the procedure. . Blood Pressure Medicine: Take your blood pressure medicine with a sip of water the morning of the procedure. . Blood thinners:  . Diabetics on insulin: Notify the staff so that you can be scheduled 1st case in the morning. If your diabetes requires high dose insulin, take only  of your normal insulin dose the morning of the procedure and notify the staff that you have done so. . Preventing infections: Shower with an antibacterial soap the morning of your procedure. . Build-up your immune system: Take 1000 mg of Vitamin C with every meal (3 times a day) the day prior to your procedure. [name] Antibiotics: Inform the staff if you have a condition or reason that requires you to take antibiotics before dental procedures. . Pregnancy: If you are pregnant, call and cancel the procedure. . Sickness: If you have a cold, fever, or any active infections, call and cancel the procedure. . Arrival: You must be in the facility at least 30 minutes prior to your scheduled procedure. . Children: Do not bring children with you. . Dress appropriately: Bring dark clothing that you would not mind  if they get stained. . Valuables: Do not bring any jewelry or valuables.  Procedure appointments are reserved for interventional treatments only. [name] No Prescription Refills. . No medication changes will be discussed during procedure appointments. . No disability issues will be discussed.  Remember:  Regular Business hours are:  Monday to Thursday 8:00 AM to 4:00 PM  Provider's Schedule: Delano Metz, MD:  Procedure days: Tuesday and Thursday 7:30 AM to 4:00 PM  Edward Jolly, MD:  Procedure days: Monday and Wednesday 7:30 AM to 4:00 PM ____________________________________________________________________________________________  ____________________________________________________________________________________________  Pain Scale  Introduction: The pain score used by this practice is the Verbal Numerical Rating Scale (VNRS-11). This is an 11-point scale. It is for adults and children 10 years or older. There are significant differences in how the pain score is reported, used, and applied. Forget everything you learned in the past and learn this scoring system.  General Information: The scale should reflect your current level of pain. Unless you are specifically asked for the level of your worst pain, or your average pain. If you are asked for one of these two, then it should be understood that it is over the past 24 hours.  Basic Activities of Daily Living (ADL): Personal hygiene, dressing, eating, transferring, and using restroom.  Instructions: Most patients tend to report their level of pain as a combination of two factors, their physical pain and their psychosocial pain. This last one is also known as "suffering" and it is reflection of how physical pain affects you socially and psychologically. From now on, report them separately. From this point  on, when asked to report your pain level, report only your physical pain. Use the following table for reference.  Pain Clinic Pain  Levels (0-5/10)  Pain Level Score  Description  No Pain 0   Mild pain 1 Nagging, annoying, but does not interfere with basic activities of daily living (ADL). Patients are able to eat, bathe, get dressed, toileting (being able to get on and off the toilet and perform personal hygiene functions), transfer (move in and out of bed or a chair without assistance), and maintain continence (able to control bladder and bowel functions). Blood pressure and heart rate are unaffected. A normal heart rate for a healthy adult ranges from 60 to 100 bpm (beats per minute).   Mild to moderate pain 2 Noticeable and distracting. Impossible to hide from other people. More frequent flare-ups. Still possible to adapt and function close to normal. It can be very annoying and may have occasional stronger flare-ups. With discipline, patients may get used to it and adapt.   Moderate pain 3 Interferes significantly with activities of daily living (ADL). It becomes difficult to feed, bathe, get dressed, get on and off the toilet or to perform personal hygiene functions. Difficult to get in and out of bed or a chair without assistance. Very distracting. With effort, it can be ignored when deeply involved in activities.   Moderately severe pain 4 Impossible to ignore for more than a few minutes. With effort, patients may still be able to manage work or participate in some social activities. Very difficult to concentrate. Signs of autonomic nervous system discharge are evident: dilated pupils (mydriasis); mild sweating (diaphoresis); sleep interference. Heart rate becomes elevated (>115 bpm). Diastolic blood pressure (lower number) rises above 100 mmHg. Patients find relief in laying down and not moving.   Severe pain 5 Intense and extremely unpleasant. Associated with frowning face and frequent crying. Pain overwhelms the senses.  Ability to do any activity or maintain social relationships becomes significantly limited. Conversation  becomes difficult. Pacing back and forth is common, as getting into a comfortable position is nearly impossible. Pain wakes you up from deep sleep. Physical signs will be obvious: pupillary dilation; increased sweating; goosebumps; brisk reflexes; cold, clammy hands and feet; nausea, vomiting or dry heaves; loss of appetite; significant sleep disturbance with inability to fall asleep or to remain asleep. When persistent, significant weight loss is observed due to the complete loss of appetite and sleep deprivation.  Blood pressure and heart rate becomes significantly elevated. Caution: If elevated blood pressure triggers a pounding headache associated with blurred vision, then the patient should immediately seek attention at an urgent or emergency care unit, as these may be signs of an impending stroke.    Emergency Department Pain Levels (6-10/10)  Emergency Room Pain 6 Severely limiting. Requires emergency care and should not be seen or managed at an outpatient pain management facility. Communication becomes difficult and requires great effort. Assistance to reach the emergency department may be required. Facial flushing and profuse sweating along with potentially dangerous increases in heart rate and blood pressure will be evident.   Distressing pain 7 Self-care is very difficult. Assistance is required to transport, or use restroom. Assistance to reach the emergency department will be required. Tasks requiring coordination, such as bathing and getting dressed become very difficult.   Disabling pain 8 Self-care is no longer possible. At this level, pain is disabling. The individual is unable to do even the most "basic" activities such as walking, eating, bathing,  dressing, transferring to a bed, or toileting. Fine motor skills are lost. It is difficult to think clearly.   Incapacitating pain 9 Pain becomes incapacitating. Thought processing is no longer possible. Difficult to remember your own name.  Control of movement and coordination are lost.   The worst pain imaginable 10 At this level, most patients pass out from pain. When this level is reached, collapse of the autonomic nervous system occurs, leading to a sudden drop in blood pressure and heart rate. This in turn results in a temporary and dramatic drop in blood flow to the brain, leading to a loss of consciousness. Fainting is one of the body's self defense mechanisms. Passing out puts the brain in a calmed state and causes it to shut down for a while, in order to begin the healing process.    Summary: 1. Refer to this scale when providing [place] with your pain level. 2. Be accurate and careful when reporting your pain level. This will help with your care. 3. Over-reporting your pain level will lead to loss of credibility. 4. Even a level of 1/10 means that there is pain and will be treated at our facility. 5. High, inaccurate reporting will be documented as "Symptom Exaggeration", leading to loss of credibility and suspicions of possible secondary gains such as obtaining more narcotics, or wanting to appear disabled, for fraudulent reasons. 6. Only pain levels of 5 or below will be seen at our facility. 7. Pain levels of 6 and above will be sent to the Emergency Department and the appointment cancelled. ____________________________________________________________________________________________

## 2018-01-03 NOTE — Progress Notes (Signed)
Nursing Pain Medication Assessment:  Safety precautions to be maintained throughout the outpatient stay will include: orient to surroundings, keep bed in low position, maintain call bell within reach at all times, provide assistance with transfer out of bed and ambulation.  Medication Inspection Compliance: Pill count conducted under aseptic conditions, in front of the patient. Neither the pills nor the bottle was removed from the patient's sight at any time. Once count was completed pills were immediately returned to the patient in their original bottle.  Medication: Oxycodone IR Pill/Patch Count: 43 of 60 pills remain Pill/Patch Appearance: Markings consistent with prescribed medication Bottle Appearance: Standard pharmacy container. Clearly labeled. Filled Date: 2 /06/ 2019 Last Medication intake:  Yesterday

## 2018-01-03 NOTE — Progress Notes (Signed)
Patient's Name: Jennifer Harmon  MRN: [id number]  Referring Provider: Glenda Chroman, MD  DOB: [birth year]  PCP: Glenda Chroman, MD  DOS: 01/03/2018  Note by: Gaspar Cola, MD  Service setting: Ambulatory outpatient  Specialty: Interventional Pain Management  Location: [facility] (AMB) Pain Management Facility    Patient type: Established   Primary Reason(s) for Visit: Encounter for prescription drug management & post-procedure evaluation of chronic illness with mild to moderate exacerbation(Level of risk: moderate) CC: Rectal Pain (bilateral, right is worse) and Leg Pain (right thigh)  HPI  Ms. Mceachron is a 57 y.o. year old, female patient, who comes today for a post-procedure evaluation and medication management. She has Dyslipidemia; History of gastroesophageal reflux (GERD); History of sleep apnea; History of depression; History of anxiety; Fibromyalgia; DDD (degenerative disc disease), lumbar; S/P partial knee replacement (Left); S/P total knee replacement (Right); Osteoarthritis of feet (Bilateral); History of diverticulosis; Former smoker; Chronic diarrhea; Chronic inflammatory arthritis; History of knee arthroplasty (Bilateral); Osteoarthritis of knee (Right); Symptomatic mammary hypertrophy; Chronic upper back pain (Primary Area of Pain) (Bilateral) (R>L); Chronic neck pain (Tertiary Area of Pain) (Bilateral) (R>L); Chronic shoulder pain; Chronic upper extremity pain (Bilateral) (R>L); Chronic pain syndrome; Long term current use of opiate analgesic; Disorder of skeletal system; Chronic sacroiliac joint pain (Bilateral) (R>L); Chronic low back pain  (Secondary Area of Pain) (Bilateral) (R>L); Chronic knee pain (Fourth Area of Pain) (Bilateral) (R>L); Long term prescription opiate use; NSAID long-term use; Opiate use; Pharmacologic therapy; Problems influencing health status; Neurogenic pain; Chronic musculoskeletal pain; Chronic recurrent lower extremity muscle spasms (Bilateral); Long term  prescription benzodiazepine use; Thoracic radiculopathy due to osteoarthritis of spine; Thoracic radiculitis (T6/T7) (Right); Chronic lower extremity pain (Bilateral) (R>L); Chronic lower extremity radicular pain (L5) (Right); DISH (diffuse idiopathic skeletal hyperostosis); 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 Her primarily concern today is the Rectal Pain (bilateral, right is worse) and Leg Pain (right thigh)  Pain Assessment: Location: Right(right is worse) Buttocks Radiating: radiates to right thigh Onset: More than a month ago Duration: Chronic pain Quality: Spasm Severity: 7 /10 (self-reported pain score)  Note: Reported level is inconsistent with clinical observations. Clinically the patient looks like a 3/10 A 3/10 is viewed as "Moderate" and described as significantly interfering with activities of daily living (ADL). It becomes difficult to feed, bathe, get dressed, get on and off the toilet or to perform personal hygiene functions. Difficult to get in and out of bed or a chair without assistance. Very distracting. With effort, it can be ignored when deeply involved in activities. Information on the proper use of the pain scale provided to the patient today. When using our objective Pain Scale, levels between 6 and 10/10 are said to belong in an emergency room, as it progressively worsens from a 6/10, described as severely limiting, requiring emergency care not usually available at an outpatient pain management facility. At a 6/10 level, communication becomes difficult and requires great effort. Assistance to reach the emergency  department may be required. Facial flushing and profuse sweating along with potentially dangerous increases in heart rate and blood pressure will be evident. Timing: Constant Modifying factors: heat  Ms. Peltz was last seen on 12/14/2017 for a procedure. During  today's appointment we reviewed Ms. Nedrow's post-procedure results, as well as her outpatient medication regimen.  The patient returns to the clinic today after having had a diagnostic bilateral lumbar facet block #1 under fluoroscopic guidance and IV sedation.  This provided the patient with significant relief of her low back pain.  The pain in the left side of her lower back completely went away but she continues to have pain in the lower portion of the right lower back and down the leg.  The pain going down the right leg has a pattern that does not seem to be dermatomal.  She indicates that she has pain in the entire upper portion of the leg, especially in the buttocks area.  Physical examination today was positive for sacroiliac joint pain and hip joint pain on the right side, as demonstrated by a positive "Patrick maneuver".  The figure 4 maneuver proved to be positive with pain in the area of the SI joint and the hip joint, bilaterally but more so on the right side.  The patient is range of motion on the hips was significantly decreased on both sides.  Because of this, today we have ordered some x-rays of the hip joints to evaluate their condition.  We do not expect any acute injuries, but will be paying attention to the possibility of DJD and/or osteoarthritis.  Based on the patient's clinical symptoms and physical examination, we have decided to bring her back for a diagnostic right sacroiliac joint block + diagnostic right intra-articular hip joint injection.  Further details on both, my assessment(s), as well as the proposed treatment plan, please see below.  Controlled Substance Pharmacotherapy Assessment REMS (Risk Evaluation and  Mitigation Strategy)  Analgesic: Oxycodone IR 5 mg twice daily MME/day: 15 mg/day.  Dewayne Shorter, RN  01/03/2018  8:26 AM  Signed Nursing Pain Medication Assessment:  Safety precautions to be maintained throughout the outpatient stay will include: orient to surroundings, keep bed in low position, maintain call bell within reach at all times, provide assistance with transfer out of bed and ambulation.  Medication Inspection Compliance: Pill count conducted under aseptic conditions, in front of the patient. Neither the pills nor the bottle was removed from the patient's sight at any time. Once count was completed pills were immediately returned to the patient in their original bottle.  Medication: Oxycodone IR Pill/Patch Count: 43 of 60 pills remain Pill/Patch Appearance: Markings consistent with prescribed medication Bottle Appearance: Standard pharmacy container. Clearly labeled. Filled Date: 2 /06/ 2019 Last Medication intake:  Yesterday   Pharmacokinetics: Liberation and absorption (onset of action): WNL Distribution (time to peak effect): WNL Metabolism and excretion (duration of action): WNL         Pharmacodynamics: Desired effects: Analgesia: Ms. Alcalde reports >50% benefit. Functional ability: Patient reports that medication allows her to accomplish basic ADLs Clinically meaningful improvement in function (CMIF): Sustained CMIF goals met Perceived effectiveness: Described as relatively effective, allowing for increase in activities of daily living (ADL) Undesirable effects: Side-effects or Adverse reactions: None reported Monitoring: [place] PMP: Online review of the past 58-monthperiod conducted. Compliant with practice rules and regulations Last UDS on record: Summary  Date Value Ref Range Status  09/06/2017 FINAL  Final    Comment:    ==================================================================== [facility] COMP DRUG  ANALYSIS,UR ==================================================================== Test                             Result  Flag       Units Drug Present and Declared for Prescription Verification   7-aminoclonazepam              238          EXPECTED   ng/mg creat    7-aminoclonazepam is an expected metabolite of clonazepam. Source    of clonazepam is a scheduled prescription medication.   Oxycodone                      2298         EXPECTED   ng/mg creat   Oxymorphone                    376          EXPECTED   ng/mg creat   Noroxycodone                   3913         EXPECTED   ng/mg creat   Noroxymorphone                 77           EXPECTED   ng/mg creat    Sources of oxycodone are scheduled prescription medications.    Oxymorphone, noroxycodone, and noroxymorphone are expected    metabolites of oxycodone. Oxymorphone is also available as a    scheduled prescription medication.   Pregabalin                     PRESENT      EXPECTED   Cyclobenzaprine                PRESENT      EXPECTED   Desmethylcyclobenzaprine       PRESENT      EXPECTED    Desmethylcyclobenzaprine is an expected metabolite of    cyclobenzaprine.   Bupropion                      PRESENT      EXPECTED   Hydroxybupropion               PRESENT      EXPECTED    Hydroxybupropion is an expected metabolite of bupropion.   Acetaminophen                  PRESENT      EXPECTED Drug Present not Declared for Prescription Verification   Diphenhydramine                PRESENT      UNEXPECTED Drug Absent but Declared for Prescription Verification   Hydrocodone                    Not Detected UNEXPECTED ng/mg creat   Citalopram                     Not Detected UNEXPECTED   Diclofenac                     Not Detected UNEXPECTED    Diclofenac, as indicated in the declared medication list, is not    always detected even when used as directed.   Salicylate                     Not Detected UNEXPECTED    Aspirin, as indicated in  the declared medication list, is not    always detected even when used as directed. ==================================================================== Test                      Result    Flag   Units      Ref Range   Creatinine              127              mg/dL      >=20 ==================================================================== Declared Medications:  The flagging and interpretation on this report are based on the  following declared medications.  Unexpected results may arise from  inaccuracies in the declared medications.  **Note: The testing scope of this panel includes these medications:  Bupropion (Wellbutrin)  Citalopram (Celexa)  Clonazepam (Klonopin)  Cyclobenzaprine (Flexeril)  Hydrocodone (Norco)  Oxycodone  Pregabalin (Lyrica)  **Note: The testing scope of this panel does not include small to  moderate amounts of these reported medications:  Acetaminophen (Norco)  Aspirin (Aspirin 81)  Diclofenac (Voltaren)  **Note: The testing scope of this panel does not include following  reported medications:  Famotidine (Pepcid)  Latanoprost (Xalatan)  Pantoprazole  Ranitidine (Zantac)  Simvastatin (Zocor) ==================================================================== For clinical consultation, please call [phone]. ====================================================================    UDS interpretation: Compliant          Medication Assessment Form: Reviewed. Patient indicates being compliant with therapy Treatment compliance: Compliant Risk Assessment Profile: Aberrant behavior: See prior evaluations. None observed or detected today Comorbid factors increasing risk of overdose: See prior notes. No additional risks detected today Risk of substance use disorder (SUD): Low Opioid Risk Tool - 12/04/17 1318      Family History of Substance Abuse   Alcohol  Negative    Illegal Drugs  Negative    Rx Drugs  Negative      Personal History of  Substance Abuse   Alcohol  Negative    Illegal Drugs  Negative      Total Score   Opioid Risk Tool Scoring  0    Opioid Risk Interpretation  Low Risk      ORT Scoring interpretation table:  Score <3 = Low Risk for SUD  Score between 4-7 = Moderate Risk for SUD  Score >8 = High Risk for Opioid Abuse   Risk Mitigation Strategies:  Patient Counseling: Covered Patient-Prescriber Agreement (PPA): Present and active  Notification to other healthcare providers: Done  Pharmacologic Plan: No change in therapy, at this time.             Post-Procedure Assessment  12/14/2017 Procedure: Diagnostic bilateral lumbar facet block #1 under fluoroscopic guidance and IV sedation Pre-procedure pain score:  8/10 Post-procedure pain score: 5/10 (< 50% relief) Influential Factors: BMI: 36.31 kg/m Intra-procedural challenges: Increased level of difficulty due to Ms. Silman's obesity  and movement. Assessment challenges: None detected.              Reported side-effects: None.        Post-procedural adverse reactions or complications: Transient increase in pain.         Sedation: Sedation provided. When no sedatives are used, the analgesic levels obtained are directly associated to the effectiveness of the local anesthetics. However, when sedation is provided, the level of analgesia obtained during the initial 1 hour following the intervention, is believed to be the result of a combination of factors. These factors may include, but are not limited to: 1. The effectiveness of  the local anesthetics used. 2. The effects of the analgesic(s) and/or anxiolytic(s) used. 3. The degree of discomfort experienced by the patient at the time of the procedure. 4. The patients ability and reliability in recalling and recording the events. 5. The presence and influence of possible secondary gains and/or psychosocial factors. Reported result: Relief experienced during the 1st hour after the procedure: 100 % (Ultra-Short  Term Relief)            Interpretative annotation: Clinically appropriate result. Analgesia during this period is likely to be Local Anesthetic and/or IV Sedative (Analgesic/Anxiolytic) related.          Effects of local anesthetic: The analgesic effects attained during this period are directly associated to the localized infiltration of local anesthetics and therefore cary significant diagnostic value as to the etiological location, or anatomical origin, of the pain. Expected duration of relief is directly dependent on the pharmacodynamics of the local anesthetic used. Long-acting (4-6 hours) anesthetics used.  Reported result: Relief during the next 4 to 6 hour after the procedure: 100 % (Short-Term Relief)            Interpretative annotation: Clinically appropriate result. Analgesia during this period is likely to be Local Anesthetic-related.          Long-term benefit: Defined as the period of time past the expected duration of local anesthetics (1 hour for short-acting and 4-6 hours for long-acting). With the possible exception of prolonged sympathetic blockade from the local anesthetics, benefits during this period are typically attributed to, or associated with, other factors such as analgesic sensory neuropraxia, antiinflammatory effects, or beneficial biochemical changes provided by agents other than the local anesthetics.  Reported result: Extended relief following procedure: 100 %(having buttock and thigh pain) (Long-Term Relief)            Interpretative annotation: Clinically appropriate result. Good relief. No permanent benefit expected. Inflammation plays a part in the etiology to the pain.          Current benefits: Defined as reported results that persistent at this point in time.   Analgesia: 100 % for the left side of the lower back and approximately 90% on the right side.  She still has some pain that appears to be secondary to the SI joint. Ms. Corriher reports improvement of axial  symptoms. Function: Ms. Krass reports improvement in function ROM: Ms. Pinela reports improvement in ROM Interpretative annotation: Ongoing benefit. Therapeutic benefit observed. Effective diagnostic intervention.          Interpretation: Results would suggest a successful diagnostic intervention.                  Plan:  The patient appears to have some problems with her right SI joint and hip joint, based on today's physical exam.  I will be bringing her back for a diagnostic intra-articular hip joint injection and SI joint injection on the right side.                Laboratory Chemistry  Inflammation Markers (CRP: Acute Phase) (ESR: Chronic Phase) Lab Results  Component Value Date   CRP <0.8 09/06/2017   ESRSEDRATE 18 09/06/2017                         Rheumatology Markers Lab Results  Component Value Date   RF <14 07/03/2017                Renal Function Markers Lab  Results  Component Value Date   BUN 16 09/06/2017   CREATININE 0.67 09/06/2017   GFRAA >60 09/06/2017   GFRNONAA >60 09/06/2017                 Hepatic Function Markers Lab Results  Component Value Date   AST 26 09/06/2017   ALT 31 09/06/2017   ALBUMIN 4.8 09/06/2017   ALKPHOS 100 09/06/2017                 Electrolytes Lab Results  Component Value Date   NA 140 09/06/2017   K 4.0 09/06/2017   CL 104 09/06/2017   CALCIUM 10.0 09/06/2017   MG 2.0 07/03/2017                        Neuropathy Markers Lab Results  Component Value Date   JJHERDEY81 448 09/06/2017                 Coagulation Parameters Lab Results  Component Value Date   PLT 197 07/03/2017                 Cardiovascular Markers Lab Results  Component Value Date   CKTOTAL 130 07/03/2017   HGB 14.0 07/03/2017   HCT 43.3 07/03/2017                 Note: Lab results reviewed.  Recent Diagnostic Imaging Results  DG HIP UNILAT W OR W/O PELVIS 2-3 VIEWS RIGHT CLINICAL DATA:  Chronic right hip pain  EXAM: DG HIP (WITH  OR WITHOUT PELVIS) 2-3V RIGHT  COMPARISON:  Radiographs 09/06/2017  FINDINGS: Minimal SI joint sclerosis with patency. Postsurgical changes in the lumbosacral spine. Pubic symphysis and rami are intact. No acute fracture or malalignment. Prominent osteophyte color at the right femoral head neck junction. Mild to moderate arthritis of the right greater the left hip. Degenerative cysts in the right humeral head.  IMPRESSION: 1. No acute osseous abnormality 2. Mild to moderate arthritis of the right greater than left hips.  Electronically Signed   By: Donavan Foil M.D.   On: 01/03/2018 14:51  Complexity Note: I personally reviewed the fluoroscopic imaging of the procedure.                        Meds   Current Outpatient Medications:  .  aspirin 81 MG tablet, Take 81 mg by mouth daily.  , Disp: , Rfl:  .  ciprofloxacin (CIPRO) 500 MG tablet, , Disp: , Rfl:  .  citalopram (CELEXA) 20 MG tablet, Take 20 mg by mouth daily.  , Disp: , Rfl:  .  clonazePAM (KLONOPIN) 1 MG tablet, Take 1 mg by mouth daily. , Disp: , Rfl:  .  cyclobenzaprine (FLEXERIL) 10 MG tablet, Take 1 tablet (10 mg total) by mouth 2 (two) times daily as needed for muscle spasms., Disp: 60 tablet, Rfl: 2 .  ibuprofen (ADVIL,MOTRIN) 200 MG tablet, Take 400 mg by mouth every 6 (six) hours as needed., Disp: , Rfl:  .  latanoprost (XALATAN) 0.005 % ophthalmic solution, Place 1 drop into both eyes daily., Disp: , Rfl:  .  LYRICA 100 MG capsule, Take 1 capsule (100 mg total) by mouth 2 (two) times daily., Disp: 60 capsule, Rfl: 2 .  Magnesium Oxide 500 MG CAPS, Take 1 capsule (500 mg total) by mouth 2 (two) times daily at 8 am and 10 pm., Disp: 60 capsule, Rfl: 2 .  oxyCODONE (OXY IR/ROXICODONE) 5 MG immediate release tablet, Take 1 tablet (5 mg total) by mouth 2 (two) times daily as needed for severe pain., Disp: 60 tablet, Rfl: 0 .  [START ON 02/02/2018] oxyCODONE (OXY IR/ROXICODONE) 5 MG immediate release tablet, Take 1  tablet (5 mg total) by mouth 2 (two) times daily as needed for severe pain., Disp: 60 tablet, Rfl: 0 .  [START ON 03/04/2018] oxyCODONE (OXY IR/ROXICODONE) 5 MG immediate release tablet, Take 1 tablet (5 mg total) by mouth 2 (two) times daily as needed for severe pain., Disp: 60 tablet, Rfl: 0 .  pantoprazole (PROTONIX) 40 MG tablet, Take 40 mg by mouth daily.  , Disp: , Rfl:  .  ranitidine (ZANTAC) 150 MG tablet, Take 150 mg by mouth daily., Disp: , Rfl:   ROS  Constitutional: Denies any fever or chills Gastrointestinal: No reported hemesis, hematochezia, vomiting, or acute GI distress Musculoskeletal: Denies any acute onset joint swelling, redness, loss of ROM, or weakness Neurological: No reported episodes of acute onset apraxia, aphasia, dysarthria, agnosia, amnesia, paralysis, loss of coordination, or loss of consciousness  Allergies  Ms. Rivenbark is allergic to asa [aspirin]; bupropion; and tape.  [place]  Drug: Ms. Swint  reports that she does not use drugs. Alcohol:  reports that she does not drink alcohol. Tobacco:  reports that she quit smoking about 2 years ago. Her smoking use included cigarettes. She has a 10.00 pack-year smoking history. She uses smokeless tobacco. Medical:  has a past medical history of Anxiety, Chronic chest pain, Depression, DJD (degenerative joint disease), Fibromyalgia, GERD (gastroesophageal reflux disease), and Hypercholesterolemia. Surgical: Ms. Befort  has a past surgical history that includes Cholecystectomy; Cesarean section; and Back surgery (2008). Family: family history includes Arthritis in her father; Hypertension in her mother.  Constitutional Exam  General appearance: Well nourished, well developed, and well hydrated. In no apparent acute distress Vitals:   01/03/18 0816  BP: 138/67  Pulse: 85  Resp: 18  SpO2: 99%  Weight: 205 lb (93 kg)  Height: 5' 3" (1.6 m)   BMI Assessment: Estimated body mass index is 36.31 kg/m as calculated from  the following:   Height as of this encounter: 5' 3" (1.6 m).   Weight as of this encounter: 205 lb (93 kg).  BMI interpretation table: BMI level Category Range association with higher incidence of chronic pain  <18 kg/m2 Underweight   18.5-24.9 kg/m2 Ideal body weight   25-29.9 kg/m2 Overweight Increased incidence by 20%  30-34.9 kg/m2 Obese (Class I) Increased incidence by 68%  35-39.9 kg/m2 Severe obesity (Class II) Increased incidence by 136%  >40 kg/m2 Extreme obesity (Class III) Increased incidence by 254%   BMI Readings from Last 4 Encounters:  01/03/18 36.31 kg/m  12/14/17 33.66 kg/m  12/04/17 35.43 kg/m  11/21/17 35.43 kg/m   Wt Readings from Last 4 Encounters:  01/03/18 205 lb (93 kg)  12/14/17 190 lb (86.2 kg)  12/04/17 200 lb (90.7 kg)  11/21/17 200 lb (90.7 kg)  Psych/Mental status: Alert, oriented x 3 (person, place, & time)       Eyes: PERLA Respiratory: No evidence of acute respiratory distress  Cervical Spine Area Exam  Skin & Axial Inspection: No masses, redness, edema, swelling, or associated skin lesions Alignment: Symmetrical Functional ROM: Unrestricted ROM      Stability: No instability detected Muscle Tone/Strength: Functionally intact. No obvious neuro-muscular anomalies detected. Sensory (Neurological): Unimpaired Palpation: No palpable anomalies  Upper Extremity (UE) Exam    Side: Right upper extremity  Side: Left upper extremity  Skin & Extremity Inspection: Skin color, temperature, and hair growth are WNL. No peripheral edema or cyanosis. No masses, redness, swelling, asymmetry, or associated skin lesions. No contractures.  Skin & Extremity Inspection: Skin color, temperature, and hair growth are WNL. No peripheral edema or cyanosis. No masses, redness, swelling, asymmetry, or associated skin lesions. No contractures.  Functional ROM: Unrestricted ROM          Functional ROM: Unrestricted ROM          Muscle Tone/Strength:  Functionally intact. No obvious neuro-muscular anomalies detected.  Muscle Tone/Strength: Functionally intact. No obvious neuro-muscular anomalies detected.  Sensory (Neurological): Unimpaired          Sensory (Neurological): Unimpaired          Palpation: No palpable anomalies              Palpation: No palpable anomalies              Specialized Test(s): Deferred         Specialized Test(s): Deferred          Thoracic Spine Area Exam  Skin & Axial Inspection: No masses, redness, or swelling Alignment: Symmetrical Functional ROM: Unrestricted ROM Stability: No instability detected Muscle Tone/Strength: Functionally intact. No obvious neuro-muscular anomalies detected. Sensory (Neurological): Unimpaired Muscle strength & Tone: No palpable anomalies  Lumbar Spine Area Exam  Skin & Axial Inspection: No masses, redness, or swelling Alignment: Symmetrical Functional ROM: Improved after treatment      Stability: No instability detected Muscle Tone/Strength: Functionally intact. No obvious neuro-muscular anomalies detected. Sensory (Neurological): Movement-associated pain Palpation: Complains of area being tender to palpation       Provocative Tests: Lumbar Hyperextension and rotation test: Improved after treatment bilaterally for facet joint pain. Lumbar Lateral bending test: evaluation deferred today       Patrick's Maneuver: Positive for bilateral S-I arthralgia (R>L) and for bilateral hip arthralgia (R>L)  Gait & Posture Assessment  Ambulation: Limited Gait: Antalgic Posture: Antalgic   Lower Extremity Exam    Side: Right lower extremity  Side: Left lower extremity  Skin & Extremity Inspection: Skin color, temperature, and hair growth are WNL. No peripheral edema or cyanosis. No masses, redness, swelling, asymmetry, or associated skin lesions. No contractures.  Skin & Extremity Inspection: Skin color, temperature, and hair growth are WNL. No peripheral edema or cyanosis. No masses,  redness, swelling, asymmetry, or associated skin lesions. No contractures.  Functional ROM: Limited ROM for hip joint  Functional ROM: Limited ROM for hip joint  Muscle Tone/Strength: Functionally intact. No obvious neuro-muscular anomalies detected.  Muscle Tone/Strength: Functionally intact. No obvious neuro-muscular anomalies detected.  Sensory (Neurological): Arthropathic arthralgia  Sensory (Neurological): Arthropathic arthralgia  Palpation: No palpable anomalies  Palpation: No palpable anomalies   Assessment  Primary Diagnosis & Pertinent Problem List: The primary encounter diagnosis was Spondylosis without myelopathy or radiculopathy, lumbosacral region. Diagnoses of Lumbar facet syndrome (Bilateral) (R>L), Chronic low back pain  (Secondary Area of Pain) (Bilateral) (R>L), Chronic upper back pain (Primary Area of Pain) (Bilateral) (R>L), Chronic musculoskeletal pain, Muscle spasms of both lower extremities, Chronic pain syndrome, Neurogenic pain, Fibromyalgia, Chronic hip pain (Bilateral) (R>L), Chronic sacroiliac joint pain (Bilateral) (R>L), Other specified dorsopathies, sacral and sacrococcygeal region, and DISH (diffuse idiopathic skeletal hyperostosis) were also pertinent to this visit.  Status Diagnosis  Controlled Controlled Persistent 1. Spondylosis without myelopathy or radiculopathy, lumbosacral region  2. Lumbar facet syndrome (Bilateral) (R>L)   3. Chronic low back pain  (Secondary Area of Pain) (Bilateral) (R>L)   4. Chronic upper back pain (Primary Area of Pain) (Bilateral) (R>L)   5. Chronic musculoskeletal pain   6. Muscle spasms of both lower extremities   7. Chronic pain syndrome   8. Neurogenic pain   9. Fibromyalgia   10. Chronic hip pain (Bilateral) (R>L)   11. Chronic sacroiliac joint pain (Bilateral) (R>L)   12. Other specified dorsopathies, sacral and sacrococcygeal region   13. DISH (diffuse idiopathic skeletal hyperostosis)     Problems updated and  reviewed during this visit: Problem  Spondylosis Without Myelopathy Or Radiculopathy, Lumbosacral Region  Chronic hip pain (Bilateral) (R>L)  Other Specified Dorsopathies, Sacral and Sacrococcygeal Region  Chronic sacroiliac joint pain (Bilateral) (R>L)   Plan of Care  Pharmacotherapy (Medications Ordered): Meds ordered this encounter  Medications  . cyclobenzaprine (FLEXERIL) 10 MG tablet    Sig: Take 1 tablet (10 mg total) by mouth 2 (two) times daily as needed for muscle spasms.    Dispense:  60 tablet    Refill:  2    Do not place medication on "Automatic Refill". Fill one day early if pharmacy is closed on scheduled refill date.  [name] oxyCODONE (OXY IR/ROXICODONE) 5 MG immediate release tablet    Sig: Take 1 tablet (5 mg total) by mouth 2 (two) times daily as needed for severe pain.    Dispense:  60 tablet    Refill:  0    Do not place this medication, or any other prescription from our practice, on "Automatic Refill". Patient may have prescription filled one day early if pharmacy is closed on scheduled refill date. Do not fill until: 01/03/18 To last until: 02/02/18  . LYRICA 100 MG capsule    Sig: Take 1 capsule (100 mg total) by mouth 2 (two) times daily.    Dispense:  60 capsule    Refill:  2    Do not place medication on "Automatic Refill". Fill one day early if pharmacy is closed on scheduled refill date.  . Magnesium Oxide 500 MG CAPS    Sig: Take 1 capsule (500 mg total) by mouth 2 (two) times daily at 8 am and 10 pm.    Dispense:  60 capsule    Refill:  2    Do not place medication on "Automatic Refill". Fill one day early if pharmacy is closed on scheduled refill date.  [name] oxyCODONE (OXY IR/ROXICODONE) 5 MG immediate release tablet    Sig: Take 1 tablet (5 mg total) by mouth 2 (two) times daily as needed for severe pain.    Dispense:  60 tablet    Refill:  0    Do not place this medication, or any other prescription from our practice, on "Automatic Refill". Patient may  have prescription filled one day early if pharmacy is closed on scheduled refill date. Do not fill until: 02/02/18 To last until: 03/04/18  . oxyCODONE (OXY IR/ROXICODONE) 5 MG immediate release tablet    Sig: Take 1 tablet (5 mg total) by mouth 2 (two) times daily as needed for severe pain.    Dispense:  60 tablet    Refill:  0    Do not place this medication, or any other prescription from our practice, on "Automatic Refill". Patient may have prescription filled one day early if pharmacy is closed on scheduled refill date. Do not fill until: 03/04/18 To last until:  04/03/18   Medications administered today: Shyrl Numbers. [place] had no medications administered during this visit.   Procedure Orders     LUMBAR FACET(MEDIAL BRANCH NERVE BLOCK) MBNB     SACROILIAC JOINT INJECTION     HIP INJECTION Lab Orders  No laboratory test(s) ordered today    Imaging Orders     DG HIP UNILAT W OR W/O PELVIS 2-3 VIEWS RIGHT Referral Orders  No referral(s) requested today    Interventional management options: Planned, scheduled, and/or pending:   Diagnostic right intra-articular hip joint injection #1 + right SI joint #1 under fluoroscopic guidance and IV sedation   Considering:   Diagnostic Trigger point injections  Diagnostic bilateral LESI  Diagnostic bilateral lumbar facet nerve block #2  Possiblebilateral lumbar facet RFA  Possiblebilateral Racz(Epidural Neurolysis) Diagnostic thoracic facet nerve block  Possible thoracic facet RFA  Diagnostic bilateral CESI  Diagnosticbilateral cervical facet nerve block  Possiblebilateral cervical facet RFA  Diagnostic right shoulder intra-articular injection  Diagnostic right suprascapular nerve block  Possibleright suprascapular RFA  Diagnostic bilateral genicular nerve block  Possible bilateral genicular RFA    Palliative PRN treatment(s):   Diagnostic bilateral lumbar facet block #2    Provider-requested follow-up: Return for  Procedure (w/ sedation): (R) Hip + (R) SI Blk. From now on the patient will also do her medication refills with Dionisio David, NP, every 3 months.  Future Appointments  Date Time Provider [place]  01/18/2018  8:15 AM Milinda Pointer, MD [facility] None  03/22/2018  8:45 AM Vevelyn Francois, NP [facility] None  08/24/2018  9:15 AM Bo Merino, MD [facility] None   Primary Care Physician: Glenda Chroman, MD Location: [facility] Note by: Gaspar Cola, MD Date: 01/03/2018; Time: 4:27 PM

## 2018-01-11 DIAGNOSIS — G4733 Obstructive sleep apnea (adult) (pediatric): Secondary | ICD-10-CM | POA: Diagnosis not present

## 2018-01-15 ENCOUNTER — Telehealth: Payer: Self-pay | Admitting: Pain Medicine

## 2018-01-15 NOTE — Telephone Encounter (Signed)
Patient informed ok to take pain meds, but if having moderated sedation, must take at least 3 hours before appointment time.

## 2018-01-15 NOTE — Telephone Encounter (Signed)
Patient would like to know if it is ok to take her regular pain meds on procedure day coming up Thurs. Please call

## 2018-01-18 ENCOUNTER — Other Ambulatory Visit: Payer: Self-pay

## 2018-01-18 ENCOUNTER — Ambulatory Visit (HOSPITAL_BASED_OUTPATIENT_CLINIC_OR_DEPARTMENT_OTHER): Payer: PPO | Admitting: Pain Medicine

## 2018-01-18 ENCOUNTER — Ambulatory Visit
Admission: RE | Admit: 2018-01-18 | Discharge: 2018-01-18 | Disposition: A | Discharge status: Home / Self Care | Payer: PPO | Source: Ambulatory Visit | Attending: Pain Medicine | Admitting: Pain Medicine

## 2018-01-18 ENCOUNTER — Encounter: Payer: Self-pay | Admitting: Pain Medicine

## 2018-01-18 VITALS — BP 128/83 | HR 91 | Temp 97.4°F | Resp 17 | Ht 63.0 in | Wt 205.0 lb

## 2018-01-18 DIAGNOSIS — M160 Bilateral primary osteoarthritis of hip: Secondary | ICD-10-CM | POA: Insufficient documentation

## 2018-01-18 DIAGNOSIS — G8929 Other chronic pain: Secondary | ICD-10-CM

## 2018-01-18 DIAGNOSIS — Z886 Allergy status to analgesic agent status: Secondary | ICD-10-CM | POA: Insufficient documentation

## 2018-01-18 DIAGNOSIS — M5388 Other specified dorsopathies, sacral and sacrococcygeal region: Secondary | ICD-10-CM

## 2018-01-18 DIAGNOSIS — M25552 Pain in left hip: Secondary | ICD-10-CM

## 2018-01-18 DIAGNOSIS — M25551 Pain in right hip: Secondary | ICD-10-CM | POA: Diagnosis not present

## 2018-01-18 DIAGNOSIS — M533 Sacrococcygeal disorders, not elsewhere classified: Secondary | ICD-10-CM | POA: Diagnosis not present

## 2018-01-18 DIAGNOSIS — Z888 Allergy status to other drugs, medicaments and biological substances status: Secondary | ICD-10-CM | POA: Diagnosis not present

## 2018-01-18 MED ORDER — LACTATED RINGERS IV SOLN
1000.0000 mL | Freq: Once | INTRAVENOUS | Status: AC
  Administered 2018-01-18: 1000 mL via INTRAVENOUS

## 2018-01-18 MED ORDER — LIDOCAINE HCL 2 % IJ SOLN
10.0000 mL | Freq: Once | INTRAMUSCULAR | Status: AC
  Administered 2018-01-18: 200 mg
  Filled 2018-01-18: qty 40

## 2018-01-18 MED ORDER — MIDAZOLAM HCL 5 MG/5ML IJ SOLN
1.0000 mg | INTRAMUSCULAR | Status: DC | PRN
  Administered 2018-01-18: 3 mg via INTRAVENOUS
  Filled 2018-01-18: qty 5

## 2018-01-18 MED ORDER — IOPAMIDOL (ISOVUE-M 200) INJECTION 41%
10.0000 mL | Freq: Once | INTRAMUSCULAR | Status: AC
  Administered 2018-01-18: 10 mL via EPIDURAL
  Filled 2018-01-18: qty 10

## 2018-01-18 MED ORDER — FENTANYL CITRATE (PF) 100 MCG/2ML IJ SOLN
25.0000 ug | INTRAMUSCULAR | Status: DC | PRN
  Administered 2018-01-18: 25 ug via INTRAVENOUS
  Filled 2018-01-18: qty 2

## 2018-01-18 MED ORDER — ROPIVACAINE HCL 2 MG/ML IJ SOLN
4.0000 mL | Freq: Once | INTRAMUSCULAR | Status: DC
  Filled 2018-01-18: qty 10

## 2018-01-18 MED ORDER — METHYLPREDNISOLONE ACETATE 80 MG/ML IJ SUSP
80.0000 mg | Freq: Once | INTRAMUSCULAR | Status: AC
  Administered 2018-01-18: 80 mg via INTRA_ARTICULAR
  Filled 2018-01-18: qty 1

## 2018-01-18 MED ORDER — ROPIVACAINE HCL 2 MG/ML IJ SOLN
4.0000 mL | Freq: Once | INTRAMUSCULAR | Status: AC
  Administered 2018-01-18: 4 mL via INTRA_ARTICULAR

## 2018-01-18 NOTE — Patient Instructions (Signed)

## 2018-01-18 NOTE — Progress Notes (Signed)
Patient's Name: Jennifer Harmon  MRN: [id number]  Referring Provider: Ignatius Specking, MD  DOB: [birth year]  PCP: Ignatius Specking, MD  DOS: 01/18/2018  Note by: Oswaldo Done, MD  Service setting: Ambulatory outpatient  Specialty: Interventional Pain Management  Patient type: Established  Location: [facility] (AMB) Pain Management Facility  Visit type: Interventional Procedure   Primary Reason for Visit: Interventional Pain Management Treatment. CC: Back Pain (right, lower)  Procedure #1:  Anesthesia, Analgesia, Anxiolysis:  Type: Intra-Articular Hip Injection #1  Primary Purpose: Diagnostic Region: Posterolateral hip joint area. Level: Lower pelvic and hip joint level. Target Area: Superior aspect of the hip joint cavity, going thru the superior portion of the capsular ligament. Approach: Posterolateral approach. Laterality: Right-Sided Position: Prone  Type: Moderate (Conscious) Sedation combined with Local Anesthesia Indication(s): Analgesia and Anxiety Route: Intravenous (IV) IV Access: Secured Sedation: Meaningful verbal contact was maintained at all times during the procedure  Local Anesthetic: Lidocaine 1-2%   Indications: 1. Chronic hip pain (Bilateral) (R>L)   2. Osteoarthritis of hip (Bilateral) (R>L)    Procedure #2:  Type: Diagnostic Sacroiliac Joint Steroid Injection #1  Region: Superior Lumbosacral Region Level: PSIS (Posterior Superior Iliac Spine) Laterality: Right-Sided   Indications: 1. Chronic sacroiliac joint pain (Bilateral) (R>L)   2. Other specified dorsopathies, sacral and sacrococcygeal region    Pain Score: Pre-procedure: 7 /10 Post-procedure: 0-No pain/10  Pre-op Assessment:  Jennifer Harmon is a 57 y.o. (year old), female patient, seen today for interventional treatment. She  has a past surgical history that includes Cholecystectomy; Cesarean section; and Back surgery (2008). Ms. Myron has a current medication list which includes the following  prescription(s): aspirin, ciprofloxacin, citalopram, clonazepam, cyclobenzaprine, ibuprofen, latanoprost, lyrica, magnesium oxide, oxycodone, oxycodone, oxycodone, pantoprazole, and ranitidine, and the following Facility-Administered Medications: fentanyl, midazolam, and ropivacaine (pf) 2 mg/ml (0.2%). Her primarily concern today is the Back Pain (right, lower)  Initial Vital Signs:  Pulse Rate: 84 Temp: 98.5 F (36.9 C) Resp: 16 BP: 121/64 SpO2: 99 %  BMI: Estimated body mass index is 36.31 kg/m as calculated from the following:   Height as of this encounter: 5\' 3"  (1.6 m).   Weight as of this encounter: 205 lb (93 kg).  Risk Assessment: Allergies: Reviewed. She is allergic to asa [aspirin]; bupropion; and tape.  Allergy Precautions: None required Coagulopathies: Reviewed. None identified.  Blood-thinner therapy: None at this time Active Infection(s): Reviewed. None identified. Jennifer Harmon is afebrile  Site Confirmation: Jennifer Harmon was asked to confirm the procedure and laterality before marking the site Procedure checklist: Completed Consent: Before the procedure and under the influence of no sedative(s), amnesic(s), or anxiolytics, the patient was informed of the treatment options, risks and possible complications. To fulfill our ethical and legal obligations, as recommended by the American Medical Association's Code of Ethics, I have informed the patient of my clinical impression; the nature and purpose of the treatment or procedure; the risks, benefits, and possible complications of the intervention; the alternatives, including doing nothing; the risk(s) and benefit(s) of the alternative treatment(s) or procedure(s); and the risk(s) and benefit(s) of doing nothing. The patient was provided information about the general risks and possible complications associated with the procedure. These may include, but are not limited to: failure to achieve desired goals, infection, bleeding, organ  or nerve damage, allergic reactions, paralysis, and death. In addition, the patient was informed of those risks and complications associated to the procedure, such as failure to decrease pain; infection; bleeding; organ or  nerve damage with subsequent damage to sensory, motor, and/or autonomic systems, resulting in permanent pain, numbness, and/or weakness of one or several areas of the body; allergic reactions; (i.e.: anaphylactic reaction); and/or death. Furthermore, the patient was informed of those risks and complications associated with the medications. These include, but are not limited to: allergic reactions (i.e.: anaphylactic or anaphylactoid reaction(s)); adrenal axis suppression; blood sugar elevation that in diabetics may result in ketoacidosis or comma; water retention that in patients with history of congestive heart failure may result in shortness of breath, pulmonary edema, and decompensation with resultant heart failure; weight gain; swelling or edema; medication-induced neural toxicity; particulate matter embolism and blood vessel occlusion with resultant organ, and/or nervous system infarction; and/or aseptic necrosis of one or more joints. Finally, the patient was informed that Medicine is not an exact science; therefore, there is also the possibility of unforeseen or unpredictable risks and/or possible complications that may result in a catastrophic outcome. The patient indicated having understood very clearly. We have given the patient no guarantees and we have made no promises. Enough time was given to the patient to ask questions, all of which were answered to the patient's satisfaction. Jennifer Harmon has indicated that she wanted to continue with the procedure. Attestation: I, the ordering provider, attest that I have discussed with the patient the benefits, risks, side-effects, alternatives, likelihood of achieving goals, and potential problems during recovery for the procedure that I  have provided informed consent. Date  Time: 01/18/2018  8:18 AM  Pre-Procedure Preparation:  Monitoring: As per clinic protocol. Respiration, ETCO2, SpO2, BP, heart rate and rhythm monitor placed and checked for adequate function Safety Precautions: Patient was assessed for positional comfort and pressure points before starting the procedure. Time-out: I initiated and conducted the "Time-out" before starting the procedure, as per protocol. The patient was asked to participate by confirming the accuracy of the "Time Out" information. Verification of the correct person, site, and procedure were performed and confirmed by me, the nursing staff, and the patient. "Time-out" conducted as per Joint Commission's Universal Protocol (UP.01.01.01). Time: 0928  Description of Procedure #1:   Area Prepped: Entire Posterolateral hip area. Prepping solution: ChloraPrep (2% chlorhexidine gluconate and 70% isopropyl alcohol) Safety Precautions: Aspiration looking for blood return was conducted prior to all injections. At no point did we inject any substances, as a needle was being advanced. No attempts were made at seeking any paresthesias. Safe injection practices and needle disposal techniques used. Medications properly checked for expiration dates. SDV (single dose vial) medications used. Description of the Procedure: Protocol guidelines were followed. The patient was placed in position over the fluoroscopy table. The target area was identified and the area prepped in the usual manner. Skin & deeper tissues infiltrated with local anesthetic. Appropriate amount of time allowed to pass for local anesthetics to take effect. The procedure needles were then advanced to the target area. Proper needle placement secured. Negative aspiration confirmed. Solution injected in intermittent fashion, asking for systemic symptoms every 0.5cc of injectate. The needles were then removed and the area cleansed, making sure to leave some  of the prepping solution back to take advantage of its long term bactericidal properties. Vitals:   01/18/18 0940 01/18/18 0949 01/18/18 0958 01/18/18 1008  BP: (!) 131/93 (!) 103/49 133/65 128/83  Pulse: 91     Resp: 16 20 16 17   Temp:  (!) 97.4 F (36.3 C)    TempSrc:  Temporal    SpO2: 96% 100% 100% 93%  Weight:      Height:        Start Time: 0928 hrs. End Time: 0937 hrs. Materials:  Needle(s) Type: Regular needle Gauge: 22G Length: 5-in Medication(s): Please see orders for medications and dosing details.  Imaging Guidance for procedure #1 (Non-Spinal):  Type of Imaging Technique: Fluoroscopy Guidance (Non-Spinal) Indication(s): Assistance in needle guidance and placement for procedures requiring needle placement in or near specific anatomical locations not easily accessible without such assistance. Exposure Time: Please see nurses notes. Contrast: Before injecting any contrast, we confirmed that the patient did not have an allergy to iodine, shellfish, or radiological contrast. Once satisfactory needle placement was completed at the desired level, radiological contrast was injected. Contrast injected under live fluoroscopy. No contrast complications. See chart for type and volume of contrast used. Fluoroscopic Guidance: I was personally present during the use of fluoroscopy. "Tunnel Vision Technique" used to obtain the best possible view of the target area. Parallax error corrected before commencing the procedure. "Direction-depth-direction" technique used to introduce the needle under continuous pulsed fluoroscopy. Once target was reached, antero-posterior, oblique, and lateral fluoroscopic projection used confirm needle placement in all planes. Images permanently stored in EMR. Interpretation: I personally interpreted the imaging intraoperatively. Adequate needle placement confirmed in multiple planes. Appropriate spread of contrast into desired area was observed. No evidence of  afferent or efferent intravascular uptake. Permanent images saved into the patient's record.  Description of Procedure #2:  Position: Prone Target Area: Superior, posterior, aspect of the sacroiliac fissure Approach: Posterior, paraspinal, ipsilateral approach. Area Prepped: Entire Lower Lumbosacral Region Prepping solution: ChloraPrep (2% chlorhexidine gluconate and 70% isopropyl alcohol) Safety Precautions: Aspiration looking for blood return was conducted prior to all injections. At no point did we inject any substances, as a needle was being advanced. No attempts were made at seeking any paresthesias. Safe injection practices and needle disposal techniques used. Medications properly checked for expiration dates. SDV (single dose vial) medications used. Description of the Procedure: Protocol guidelines were followed. The patient was placed in position over the procedure table. The target area was identified and the area prepped in the usual manner. Skin & deeper tissues infiltrated with local anesthetic. Appropriate amount of time allowed to pass for local anesthetics to take effect. The procedure needle was advanced under fluoroscopic guidance into the sacroiliac joint until a firm endpoint was obtained. Proper needle placement secured. Negative aspiration confirmed. Solution injected in intermittent fashion, asking for systemic symptoms every 0.5cc of injectate. The needles were then removed and the area cleansed, making sure to leave some of the prepping solution back to take advantage of its long term bactericidal properties. Vitals:   01/18/18 0940 01/18/18 0949 01/18/18 0958 01/18/18 1008  BP: (!) 131/93 (!) 103/49 133/65 128/83  Pulse: 91     Resp: 16 20 16 17   Temp:  (!) 97.4 F (36.3 C)    TempSrc:  Temporal    SpO2: 96% 100% 100% 93%  Weight:      Height:        Start Time: 0928 hrs. End Time: 0937 hrs. Materials:  Needle(s) Type: Regular needle Gauge: 22G Length:  3.5-in Medication(s): Please see orders for medications and dosing details.  Imaging Guidance for procedure #2 (Non-Spinal):  Type of Imaging Technique: Fluoroscopy Guidance (Non-Spinal) Indication(s): Assistance in needle guidance and placement for procedures requiring needle placement in or near specific anatomical locations not easily accessible without such assistance. Exposure Time: Please see nurses notes. Contrast: Before injecting any contrast, we  confirmed that the patient did not have an allergy to iodine, shellfish, or radiological contrast. Once satisfactory needle placement was completed at the desired level, radiological contrast was injected. Contrast injected under live fluoroscopy. No contrast complications. See chart for type and volume of contrast used. Fluoroscopic Guidance: I was personally present during the use of fluoroscopy. "Tunnel Vision Technique" used to obtain the best possible view of the target area. Parallax error corrected before commencing the procedure. "Direction-depth-direction" technique used to introduce the needle under continuous pulsed fluoroscopy. Once target was reached, antero-posterior, oblique, and lateral fluoroscopic projection used confirm needle placement in all planes. Images permanently stored in EMR. Interpretation: I personally interpreted the imaging intraoperatively. Adequate needle placement confirmed in multiple planes. Appropriate spread of contrast into desired area was observed. No evidence of afferent or efferent intravascular uptake. Permanent images saved into the patient's record.  Antibiotic Prophylaxis:   Anti-infectives (From admission, onward)   None     Indication(s): None identified  Post-operative Assessment:  Post-procedure Vital Signs:  Pulse Rate: 91 Temp: (!) 97.4 F (36.3 C) Resp: 17 BP: 128/83 SpO2: 93 %  EBL: None  Complications: No immediate post-treatment complications observed by team, or reported by  patient.  Note: The patient tolerated the entire procedure well. A repeat set of vitals were taken after the procedure and the patient was kept under observation following institutional policy, for this type of procedure. Post-procedural neurological assessment was performed, showing return to baseline, prior to discharge. The patient was provided with post-procedure discharge instructions, including a section on how to identify potential problems. Should any problems arise concerning this procedure, the patient was given instructions to immediately contact us, at any time, without hesitation. In any case, we plan to contact the patient by telephone for a follow-up status report regarding this interventional procedure.  Comments:  No additional relevant information.  Plan of Care    Imaging Orders     DG C-Arm 1-60 Min-No Report  Procedure Orders     HIP INJECTION     SACROILIAC JOINT INJECTION  Medications ordered for procedure: Meds ordered this encounter  Medications  . midazolam (VERSED) 5 MG/5ML injection 1-2 mg    Make sure Flumazenil is available in the pyxis when using this medication. If oversedation occurs, administer 0.2 mg IV over 15 sec. If after 45 sec no response, administer 0.2 mg again over 1 min; may repeat at 1 min intervals; not to exceed 4 doses (1 mg)  . fentaNYL (SUBLIMAZE) injection 25-50 mcg    Make sure Narcan is available in the pyxis when using this medication. In the event of respiratory depression (RR< 8/min): Titrate NARCAN (naloxone) in increments of 0.1 to 0.2 mg IV at 2-3 minute intervals, until desired degree of reversal.  . lactated ringers infusion 1,000 mL  . iopamidol (ISOVUE-M) 41 % intrathecal injection 10 mL    Must be Myelogram-compatible. If not available, you may substitute with a water-soluble, non-ionic, hypoallergenic, myelogram-compatible radiological contrast medium.  [name] lidocaine (XYLOCAINE) 2 % (with pres) injection 200 mg  .  methylPREDNISolone acetate (DEPO-MEDROL) injection 80 mg  . ropivacaine (PF) 2 mg/mL (0.2%) (NAROPIN) injection 4 mL  . ropivacaine (PF) 2 mg/mL (0.2%) (NAROPIN) injection 4 mL  . methylPREDNISolone acetate (DEPO-MEDROL) injection 80 mg   Medications administered: We administered midazolam, fentaNYL, lactated ringers, iopamidol, lidocaine, methylPREDNISolone acetate, ropivacaine (PF) 2 mg/mL (0.2%), and methylPREDNISolone acetate.  See the medical record for exact dosing, route, and time of administration.  New  Prescriptions   No medications on file   Disposition: Discharge home  Discharge Date & Time: 01/18/2018; 1017 hrs.   Physician-requested Follow-up: Return for post-procedure eval (2 wks), w/ Dr. Laban Emperor.  Future Appointments  Date Time Provider Department Center  01/31/2018 11:15 AM Delano Metz, MD [facility] None  03/22/2018  8:45 AM Barbette Merino, NP [facility] None  08/24/2018  9:15 AM Pollyann Savoy, MD [facility] None   Primary Care Physician: Ignatius Specking, MD Location: [facility] Note by: Oswaldo Done, MD Date: 01/18/2018; Time: 10:39 AM  Disclaimer:  Medicine is not an exact science. The only guarantee in medicine is that nothing is guaranteed. It is important to note that the decision to proceed with this intervention was based on the information collected from the patient. The Data and conclusions were drawn from the patient's questionnaire, the interview, and the physical examination. Because the information was provided in large part by the patient, it cannot be guaranteed that it has not been purposely or unconsciously manipulated. Every effort has been made to obtain as much relevant data as possible for this evaluation. It is important to note that the conclusions that lead to this procedure are derived in large part from the available data. Always take into account that the treatment will also be dependent on availability of  resources and existing treatment guidelines, considered by other Pain Management Practitioners as being common knowledge and practice, at the time of the intervention. For Medico-Legal purposes, it is also important to point out that variation in procedural techniques and pharmacological choices are the acceptable norm. The indications, contraindications, technique, and results of the above procedure should only be interpreted and judged by a Board-Certified Interventional Pain Specialist with extensive familiarity and expertise in the same exact procedure and technique.

## 2018-01-18 NOTE — Progress Notes (Signed)
Safety precautions to be maintained throughout the outpatient stay will include: orient to surroundings, keep bed in low position, maintain call bell within reach at all times, provide assistance with transfer out of bed and ambulation.  

## 2018-01-19 ENCOUNTER — Telehealth: Payer: Self-pay

## 2018-01-19 DIAGNOSIS — F419 Anxiety disorder, unspecified: Secondary | ICD-10-CM | POA: Diagnosis not present

## 2018-01-19 DIAGNOSIS — K219 Gastro-esophageal reflux disease without esophagitis: Secondary | ICD-10-CM | POA: Diagnosis not present

## 2018-01-19 DIAGNOSIS — Z6835 Body mass index (BMI) 35.0-35.9, adult: Secondary | ICD-10-CM | POA: Diagnosis not present

## 2018-01-19 DIAGNOSIS — G4730 Sleep apnea, unspecified: Secondary | ICD-10-CM | POA: Diagnosis not present

## 2018-01-19 DIAGNOSIS — G8929 Other chronic pain: Secondary | ICD-10-CM | POA: Diagnosis not present

## 2018-01-19 DIAGNOSIS — E785 Hyperlipidemia, unspecified: Secondary | ICD-10-CM | POA: Diagnosis not present

## 2018-01-19 DIAGNOSIS — I10 Essential (primary) hypertension: Secondary | ICD-10-CM | POA: Diagnosis not present

## 2018-01-19 DIAGNOSIS — G4733 Obstructive sleep apnea (adult) (pediatric): Secondary | ICD-10-CM | POA: Diagnosis not present

## 2018-01-19 DIAGNOSIS — Z299 Encounter for prophylactic measures, unspecified: Secondary | ICD-10-CM | POA: Diagnosis not present

## 2018-01-19 NOTE — Telephone Encounter (Signed)
Post procedure phone call.  States she feels great.

## 2018-01-30 NOTE — Progress Notes (Signed)
Patient's Name: Jennifer Harmon  MRN: [id number]  Referring Provider: Glenda Chroman, MD  DOB: September 24, 1961  PCP: Glenda Chroman, MD  DOS: 01/31/2018  Note by: Gaspar Cola, MD  Service setting: Ambulatory outpatient  Specialty: Interventional Pain Management  Location: [facility] (AMB) Pain Management Facility    Patient type: Established   Primary Reason(s) for Visit: Encounter for post-procedure evaluation of chronic illness with mild to moderate exacerbation CC: Hip Pain (right)  HPI  Jennifer Harmon is a 57 y.o. year old, female patient, who comes today for a post-procedure evaluation. She has Dyslipidemia; History of gastroesophageal reflux (GERD); History of sleep apnea; History of depression; History of anxiety; Fibromyalgia; DDD (degenerative disc disease), lumbar; S/P partial knee replacement (Left); S/P total knee replacement (Right); Osteoarthritis of feet (Bilateral); History of diverticulosis; Former smoker; Chronic diarrhea; Chronic inflammatory arthritis; History of knee arthroplasty (Bilateral); Osteoarthritis of knee (Right); Symptomatic mammary hypertrophy; Chronic upper back pain (Primary Area of Pain) (Bilateral) (R>L); Chronic neck pain (Tertiary Area of Pain) (Bilateral) (R>L); Chronic shoulder pain; Chronic upper extremity pain (Bilateral) (R>L); Chronic pain syndrome; Long term current use of opiate analgesic; Disorder of skeletal system; Chronic sacroiliac joint pain (Bilateral) (R>L); Chronic low back pain  (Secondary Area of Pain) (Bilateral) (R>L); Chronic knee pain (Fourth Area of Pain) (Bilateral) (R>L); Long term prescription opiate use; NSAID long-term use; Opiate use; Pharmacologic therapy; Problems influencing health status; Neurogenic pain; Chronic musculoskeletal pain; Chronic recurrent lower extremity muscle spasms (Bilateral); Long term prescription benzodiazepine use; Thoracic radiculopathy due to osteoarthritis of spine; Thoracic radiculitis (T6/T7) (Right); Chronic lower  extremity pain (Bilateral) (R>L); Chronic lower extremity radicular pain (L5) (Right); DISH (diffuse idiopathic skeletal hyperostosis); 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 Her primarily concern today is the Hip Pain (right)  Pain Assessment: Location: Right Hip Radiating: Radiates down above knee Onset: More than a month ago Duration: Chronic pain Quality: Discomfort Severity: 7 /10 (self-reported pain score)  Note: Reported level is inconsistent with clinical observations. Clinically the patient looks like a 3/10 A 3/10 is viewed as "Moderate" and described as significantly interfering with activities of daily living (ADL). It becomes difficult to feed, bathe, get dressed, get on and off the toilet or to perform personal hygiene functions. Difficult to get in and out of bed or a chair without assistance. Very distracting. With effort, it can be ignored when deeply involved in activities. Information on the proper use of the pain scale provided to the patient today. When using our objective Pain Scale, levels between 6 and 10/10 are said to belong in an emergency room, as it progressively worsens from a 6/10, described as severely limiting, requiring emergency care not usually available at an outpatient pain management facility. At a 6/10 level, communication becomes difficult and requires great effort. Assistance to reach the emergency department may be required. Facial flushing and profuse sweating along with potentially dangerous increases  in heart rate and blood pressure will be evident. Timing: Constant Modifying factors: meds,heating pad  Jennifer Harmon comes in today for post-procedure evaluation after the treatment done on 01/18/2018.  Although the patient did rather well with her diagnostic right-sided intra-articular hip joint  injection and diagnostic right-sided sacroiliac joint block, she is beginning to experience pain, secondary to her lumbar facet problems.  We have done one diagnostic injection which provided her with excellent relief of the pain that has lasted until recently.  The only side that did not improve significantly was the right side and now we know that it has to do with the fact that she has not only facet problems but also SI joint problems in that right side.  In addition the patient has a known history of DISH.  Today I have provided the patient with some written information about this condition as she seems to have not being aware of its existence.  In trying to improve her pain long-term, today we spoke about her weight and the possibility of getting a reductive mammoplasty.  Apparently this was a subject that she was rather embarrassed to bring up by herself but she was very happy that I did since she confess that she has been having numbness over her breasts and this has caused problems with her burning her breast while she is cooking and not detecting it until she goes to take a shower.  This would suggest that the patient is having a thoracic radiculopathy that is likely to come from the spondylolysis caused by her thoracic DISH.  In addition, she has confess that the size of her breasts have caused significant problems with her back and because of this, I believe it to be medically necessary for this patient to undergo a bilateral reductive mammoplasty in order to significantly reduce the weight that we have over her thoracic and lumbar spine.  She indicates that in the past she had an evaluation for this, but  apparently there was an issue where the surgeon told her that he would not even consider the surgery until she lost some weight.  Apparently the surgeon had not realized that the patient was being sent to have a reductive mammoplasty secondary to the weight problem that the breast are causing.  In any case, we have decided to go ahead and send a referral to plastic surgeon for evaluation to see if they can assist this very nice 57 year old female patient with this problem.  I will continue to manage her other pain problems, but it is clear to me that this needs to be done in order to get some improvement.  Further details on both, my assessment(s), as well as the proposed treatment plan, please see below.  Post-Procedure Assessment  01/18/2018 Procedure: Diagnostic right intra-articular hip joint injection #1+ diagnostic right sacroiliac joint block #1 under fluoroscopic guidance and IV sedation Pre-procedure pain score:  7/10 Post-procedure pain score: 0/10 (100% relief) Influential Factors: BMI: 36.14 kg/m Intra-procedural challenges: None observed.         Assessment challenges: None detected.              Reported side-effects: None.        Post-procedural adverse reactions or complications: None reported         Sedation: Sedation provided. When no sedatives are used, the analgesic levels obtained are directly associated to the effectiveness of the local anesthetics. However, when sedation is provided, the level of analgesia obtained during the initial 1 hour following the intervention, is believed to be the result of a combination of factors. These factors may include, but are not limited to: 1. The effectiveness of the local anesthetics used. 2. The effects of the analgesic(s) and/or anxiolytic(s) used. 3. The degree  of discomfort experienced by the patient at the time of the procedure. 4. The patients ability and reliability in recalling and recording the events. 5. The presence and  influence of possible secondary gains and/or psychosocial factors. Reported result: Relief experienced during the 1st hour after the procedure: 100 % (Ultra-Short Term Relief)            Interpretative annotation: Clinically appropriate result. Analgesia during this period is likely to be Local Anesthetic and/or IV Sedative (Analgesic/Anxiolytic) related.          Effects of local anesthetic: The analgesic effects attained during this period are directly associated to the localized infiltration of local anesthetics and therefore cary significant diagnostic value as to the etiological location, or anatomical origin, of the pain. Expected duration of relief is directly dependent on the pharmacodynamics of the local anesthetic used. Long-acting (4-6 hours) anesthetics used.  Reported result: Relief during the next 4 to 6 hour after the procedure: 100 % (Short-Term Relief)            Interpretative annotation: Clinically appropriate result. Analgesia during this period is likely to be Local Anesthetic-related.          Long-term benefit: Defined as the period of time past the expected duration of local anesthetics (1 hour for short-acting and 4-6 hours for long-acting). With the possible exception of prolonged sympathetic blockade from the local anesthetics, benefits during this period are typically attributed to, or associated with, other factors such as analgesic sensory neuropraxia, antiinflammatory effects, or beneficial biochemical changes provided by agents other than the local anesthetics.  Reported result: Extended relief following procedure: 50 % (Long-Term Relief)            Interpretative annotation: Clinically appropriate result. Good relief. No permanent benefit expected. Inflammation plays a part in the etiology to the pain.          Current benefits: Defined as reported results that persistent at this point in time.   Analgesia: 50 % Jennifer Harmon reports improvement of arthralgia. Function:  Somewhat improved ROM: Somewhat improved Interpretative annotation: Partial relief. Therapeutic benefit observed. Effective diagnostic intervention.          Interpretation: Results would suggest that repeating the procedure may be necessary, for diagnostic reasons          Plan:  At this point, we will schedule the patient to return for a diagnostic bilateral lumbar facet block #2, but on the right side we will also do an SI joint injection to see if this time we can get better results of the right side.                Laboratory Chemistry  Inflammation Markers (CRP: Acute Phase) (ESR: Chronic Phase) Lab Results  Component Value Date   CRP <0.8 09/06/2017   ESRSEDRATE 18 09/06/2017                         Rheumatology Markers Lab Results  Component Value Date   RF <14 07/03/2017                Renal Function Markers Lab Results  Component Value Date   BUN 16 09/06/2017   CREATININE 0.67 09/06/2017   GFRAA >60 09/06/2017   GFRNONAA >60 09/06/2017                 Hepatic Function Markers Lab Results  Component Value Date   AST 26 09/06/2017   ALT  31 09/06/2017   ALBUMIN 4.8 09/06/2017   ALKPHOS 100 09/06/2017                 Electrolytes Lab Results  Component Value Date   NA 140 09/06/2017   K 4.0 09/06/2017   CL 104 09/06/2017   CALCIUM 10.0 09/06/2017   MG 2.0 07/03/2017                        Neuropathy Markers Lab Results  Component Value Date   [id number] 812 09/06/2017                 Coagulation Parameters Lab Results  Component Value Date   PLT 197 07/03/2017                 Cardiovascular Markers Lab Results  Component Value Date   CKTOTAL 130 07/03/2017   HGB 14.0 07/03/2017   HCT 43.3 07/03/2017                 Note: Lab results reviewed.  Recent Diagnostic Imaging Results  DG C-Arm 1-60 Min-No Report Fluoroscopy was utilized by the requesting physician.  No radiographic  interpretation.   Complexity Note: I personally reviewed  the fluoroscopic imaging of the procedure.                        Meds   Current Outpatient Medications:  .  citalopram (CELEXA) 20 MG tablet, Take 20 mg by mouth daily.  , Disp: , Rfl:  .  clonazePAM (KLONOPIN) 1 MG tablet, Take 1 mg by mouth daily. , Disp: , Rfl:  .  cyclobenzaprine (FLEXERIL) 10 MG tablet, Take 1 tablet (10 mg total) by mouth 2 (two) times daily as needed for muscle spasms., Disp: 60 tablet, Rfl: 2 .  latanoprost (XALATAN) 0.005 % ophthalmic solution, Place 1 drop into both eyes daily., Disp: , Rfl:  .  LYRICA 100 MG capsule, Take 1 capsule (100 mg total) by mouth 2 (two) times daily., Disp: 60 capsule, Rfl: 2 .  Magnesium Oxide 500 MG CAPS, Take 1 capsule (500 mg total) by mouth 2 (two) times daily at 8 am and 10 pm., Disp: 60 capsule, Rfl: 2 .  oxyCODONE (OXY IR/ROXICODONE) 5 MG immediate release tablet, Take 1 tablet (5 mg total) by mouth 2 (two) times daily as needed for severe pain., Disp: 60 tablet, Rfl: 0 .  [START ON 02/02/2018] oxyCODONE (OXY IR/ROXICODONE) 5 MG immediate release tablet, Take 1 tablet (5 mg total) by mouth 2 (two) times daily as needed for severe pain., Disp: 60 tablet, Rfl: 0 .  [START ON 03/04/2018] oxyCODONE (OXY IR/ROXICODONE) 5 MG immediate release tablet, Take 1 tablet (5 mg total) by mouth 2 (two) times daily as needed for severe pain., Disp: 60 tablet, Rfl: 0 .  pantoprazole (PROTONIX) 40 MG tablet, Take 40 mg by mouth daily.  , Disp: , Rfl:  .  ranitidine (ZANTAC) 150 MG tablet, Take 150 mg by mouth daily., Disp: , Rfl:   ROS  Constitutional: Denies any fever or chills Gastrointestinal: No reported hemesis, hematochezia, vomiting, or acute GI distress Musculoskeletal: Denies any acute onset joint swelling, redness, loss of ROM, or weakness Neurological: No reported episodes of acute onset apraxia, aphasia, dysarthria, agnosia, amnesia, paralysis, loss of coordination, or loss of consciousness  Allergies  Jennifer Harmon is allergic to asa  [aspirin]; bupropion; and tape.  [place]  Drug: Jennifer Harmon  reports that she does not use drugs. Alcohol:  reports that she does not drink alcohol. Tobacco:  reports that she quit smoking about 2 years ago. Her smoking use included pipe. She has a 10.00 pack-year smoking history. She uses smokeless tobacco. Medical:  has a past medical history of Anxiety, Chronic chest pain, Depression, DJD (degenerative joint disease), Fibromyalgia, GERD (gastroesophageal reflux disease), and Hypercholesterolemia. Surgical: Ms. Ezzell  has a past surgical history that includes Cholecystectomy; Cesarean section; and Back surgery (2008). Family: family history includes Arthritis in her father; Hypertension in her mother.  Constitutional Exam  General appearance: Well nourished, well developed, and well hydrated. In no apparent acute distress Vitals:   01/31/18 1032  BP: (!) 142/74  Pulse: 94  Temp: 98.2 F (36.8 C)  SpO2: 99%  Weight: 204 lb (92.5 kg)  Height: '5\' 3"'$  (1.6 m)   BMI Assessment: Estimated body mass index is 36.14 kg/m as calculated from the following:   Height as of this encounter: '5\' 3"'$  (1.6 m).   Weight as of this encounter: 204 lb (92.5 kg).  BMI interpretation table: BMI level Category Range association with higher incidence of chronic pain  <18 kg/m2 Underweight   18.5-24.9 kg/m2 Ideal body weight   25-29.9 kg/m2 Overweight Increased incidence by 20%  30-34.9 kg/m2 Obese (Class I) Increased incidence by 68%  35-39.9 kg/m2 Severe obesity (Class II) Increased incidence by 136%  >40 kg/m2 Extreme obesity (Class III) Increased incidence by 254%   BMI Readings from Last 4 Encounters:  01/31/18 36.14 kg/m  01/18/18 36.31 kg/m  01/03/18 36.31 kg/m  12/14/17 33.66 kg/m   Wt Readings from Last 4 Encounters:  01/31/18 204 lb (92.5 kg)  01/18/18 205 lb (93 kg)  01/03/18 205 lb (93 kg)  12/14/17 190 lb (86.2 kg)  Psych/Mental status: Alert, oriented x 3 (person, place, & time)        Eyes: PERLA Respiratory: No evidence of acute respiratory distress  Cervical Spine Area Exam  Skin & Axial Inspection: No masses, redness, edema, swelling, or associated skin lesions Alignment: Symmetrical Functional ROM: Unrestricted ROM      Stability: No instability detected Muscle Tone/Strength: Functionally intact. No obvious neuro-muscular anomalies detected. Sensory (Neurological): Unimpaired Palpation: No palpable anomalies              Upper Extremity (UE) Exam    Side: Right upper extremity  Side: Left upper extremity  Skin & Extremity Inspection: Skin color, temperature, and hair growth are WNL. No peripheral edema or cyanosis. No masses, redness, swelling, asymmetry, or associated skin lesions. No contractures.  Skin & Extremity Inspection: Skin color, temperature, and hair growth are WNL. No peripheral edema or cyanosis. No masses, redness, swelling, asymmetry, or associated skin lesions. No contractures.  Functional ROM: Unrestricted ROM          Functional ROM: Unrestricted ROM          Muscle Tone/Strength: Functionally intact. No obvious neuro-muscular anomalies detected.  Muscle Tone/Strength: Functionally intact. No obvious neuro-muscular anomalies detected.  Sensory (Neurological): Unimpaired          Sensory (Neurological): Unimpaired          Palpation: No palpable anomalies              Palpation: No palpable anomalies              Specialized Test(s): Deferred         Specialized Test(s): Deferred  Thoracic Spine Area Exam  Skin & Axial Inspection: No masses, redness, or swelling Alignment: Symmetrical Functional ROM: Decreased ROM Stability: No instability detected Muscle Tone/Strength: Functionally intact. No obvious neuro-muscular anomalies detected. Sensory (Neurological): Unimpaired Muscle strength & Tone: No palpable anomalies  Lumbar Spine Area Exam  Skin & Axial Inspection: No masses, redness, or swelling Alignment: Symmetrical Functional  ROM: Decreased ROM, bilaterally Stability: No instability detected Muscle Tone/Strength: Functionally intact. No obvious neuro-muscular anomalies detected. Sensory (Neurological): Movement-associated pain Palpation: Complains of area being tender to palpation       Provocative Tests: Lumbar Hyperextension and rotation test: Positive bilaterally for facet joint pain. Lumbar Lateral bending test: evaluation deferred today       Patrick's Maneuver: Positive for right-sided S-I arthralgia and for right hip arthralgia  Gait & Posture Assessment  Ambulation: Unassisted Gait: Relatively normal for age and body habitus Posture: WNL   Lower Extremity Exam    Side: Right lower extremity  Side: Left lower extremity  Skin & Extremity Inspection: Skin color, temperature, and hair growth are WNL. No peripheral edema or cyanosis. No masses, redness, swelling, asymmetry, or associated skin lesions. No contractures.  Skin & Extremity Inspection: Skin color, temperature, and hair growth are WNL. No peripheral edema or cyanosis. No masses, redness, swelling, asymmetry, or associated skin lesions. No contractures.  Functional ROM: Unrestricted ROM          Functional ROM: Unrestricted ROM          Muscle Tone/Strength: Functionally intact. No obvious neuro-muscular anomalies detected.  Muscle Tone/Strength: Functionally intact. No obvious neuro-muscular anomalies detected.  Sensory (Neurological): Unimpaired  Sensory (Neurological): Unimpaired  Palpation: No palpable anomalies  Palpation: No palpable anomalies   Assessment  Primary Diagnosis & Pertinent Problem List: The primary encounter diagnosis was Chronic hip pain (Bilateral) (R>L). Diagnoses of Chronic low back pain  (Secondary Area of Pain) (Bilateral) (R>L), Chronic upper back pain (Primary Area of Pain) (Bilateral) (R>L), DISH (diffuse idiopathic skeletal hyperostosis), Macromastia, Chronic neck pain (Tertiary Area of Pain) (Bilateral) (R>L), Thoracic  spondylosis with radiculopathy, Chronic knee pain (Fourth Area of Pain) (Bilateral) (R>L), Spondylosis without myelopathy or radiculopathy, lumbosacral region, Lumbar facet syndrome (Bilateral) (R>L), Other specified dorsopathies, sacral and sacrococcygeal region, and Chronic sacroiliac joint pain (Bilateral) (R>L) were also pertinent to this visit.  Status Diagnosis  Controlled Controlled Controlled 1. Chronic hip pain (Bilateral) (R>L)   2. Chronic low back pain  (Secondary Area of Pain) (Bilateral) (R>L)   3. Chronic upper back pain (Primary Area of Pain) (Bilateral) (R>L)   4. DISH (diffuse idiopathic skeletal hyperostosis)   5. Macromastia   6. Chronic neck pain (Tertiary Area of Pain) (Bilateral) (R>L)   7. Thoracic spondylosis with radiculopathy   8. Chronic knee pain (Fourth Area of Pain) (Bilateral) (R>L)   9. Spondylosis without myelopathy or radiculopathy, lumbosacral region   10. Lumbar facet syndrome (Bilateral) (R>L)   11. Other specified dorsopathies, sacral and sacrococcygeal region   12. Chronic sacroiliac joint pain (Bilateral) (R>L)     Problems updated and reviewed during this visit: Problem  Thoracic Spondylosis With Radiculopathy   Bilateral breast numbness   Macromastia   Plan of Care  Pharmacotherapy (Medications Ordered): No orders of the defined types were placed in this encounter.  Medications administered today: Jennifer Harmon had no medications administered during this visit.   Procedure Orders     LUMBAR FACET(MEDIAL BRANCH NERVE BLOCK) MBNB     SACROILIAC JOINT INJECTION Lab Orders  No  laboratory test(s) ordered today   Imaging Orders  No imaging studies ordered today    Referral Orders     Ambulatory referral to Plastic Surgery  Interventional management options: Planned, scheduled, and/or pending:   Diagnostic bilateral lumbar facet nerve block #2  + diagnostic right-sided sacroiliac joint block #2 under fluoroscopic guidance and IV  sedation Referral for bilateral reductive mammoplasty   Considering:   Diagnostic Trigger point injections  Diagnostic bilateral LESI  Diagnostic bilateral lumbar facet nerve block #2   Possiblebilateral lumbar facet RFA  Possiblebilateral Racz(Epidural Neurolysis) Diagnostic thoracic facet nerve block  Possible thoracic facet RFA  Diagnostic bilateral CESI  Diagnosticbilateral cervical facet nerve block  Possiblebilateral cervical facet RFA  Diagnostic right shoulder intra-articular injection  Diagnostic right suprascapular nerve block  Possibleright suprascapular RFA  Diagnostic bilateral genicular nerve block  Possible bilateral genicular RFA    Palliative PRN treatment(s):   Diagnostic bilateral lumbar facet block #2   Provider-requested follow-up: Return for Procedure (w/ sedation): (B) L-FCT BLK #2.  Future Appointments  Date Time Provider [place]  02/15/2018  1:15 PM Milinda Pointer, MD [facility] None  03/22/2018  8:45 AM Vevelyn Francois, NP [facility] None  08/24/2018  9:15 AM Bo Merino, MD [facility] None   Primary Care Physician: Glenda Chroman, MD Location: [facility] Note by: Gaspar Cola, MD Date: 01/31/2018; Time: 3:44 PM

## 2018-01-31 ENCOUNTER — Encounter: Payer: Self-pay | Admitting: Pain Medicine

## 2018-01-31 ENCOUNTER — Ambulatory Visit: Payer: PPO | Attending: Pain Medicine | Admitting: Pain Medicine

## 2018-01-31 ENCOUNTER — Other Ambulatory Visit: Payer: Self-pay

## 2018-01-31 VITALS — BP 142/74 | HR 94 | Temp 98.2°F | Ht 63.0 in | Wt 204.0 lb

## 2018-01-31 DIAGNOSIS — G4730 Sleep apnea, unspecified: Secondary | ICD-10-CM | POA: Insufficient documentation

## 2018-01-31 DIAGNOSIS — M542 Cervicalgia: Secondary | ICD-10-CM | POA: Diagnosis not present

## 2018-01-31 DIAGNOSIS — M47817 Spondylosis without myelopathy or radiculopathy, lumbosacral region: Secondary | ICD-10-CM | POA: Diagnosis not present

## 2018-01-31 DIAGNOSIS — F329 Major depressive disorder, single episode, unspecified: Secondary | ICD-10-CM | POA: Diagnosis not present

## 2018-01-31 DIAGNOSIS — M25561 Pain in right knee: Secondary | ICD-10-CM | POA: Diagnosis not present

## 2018-01-31 DIAGNOSIS — M160 Bilateral primary osteoarthritis of hip: Secondary | ICD-10-CM | POA: Insufficient documentation

## 2018-01-31 DIAGNOSIS — M19072 Primary osteoarthritis, left ankle and foot: Secondary | ICD-10-CM | POA: Diagnosis not present

## 2018-01-31 DIAGNOSIS — F419 Anxiety disorder, unspecified: Secondary | ICD-10-CM | POA: Diagnosis not present

## 2018-01-31 DIAGNOSIS — M47819 Spondylosis without myelopathy or radiculopathy, site unspecified: Secondary | ICD-10-CM | POA: Diagnosis not present

## 2018-01-31 DIAGNOSIS — M170 Bilateral primary osteoarthritis of knee: Secondary | ICD-10-CM | POA: Insufficient documentation

## 2018-01-31 DIAGNOSIS — Z87891 Personal history of nicotine dependence: Secondary | ICD-10-CM | POA: Insufficient documentation

## 2018-01-31 DIAGNOSIS — M549 Dorsalgia, unspecified: Secondary | ICD-10-CM | POA: Diagnosis not present

## 2018-01-31 DIAGNOSIS — M4724 Other spondylosis with radiculopathy, thoracic region: Secondary | ICD-10-CM | POA: Diagnosis not present

## 2018-01-31 DIAGNOSIS — K219 Gastro-esophageal reflux disease without esophagitis: Secondary | ICD-10-CM | POA: Insufficient documentation

## 2018-01-31 DIAGNOSIS — M79601 Pain in right arm: Secondary | ICD-10-CM | POA: Insufficient documentation

## 2018-01-31 DIAGNOSIS — N62 Hypertrophy of breast: Secondary | ICD-10-CM | POA: Diagnosis not present

## 2018-01-31 DIAGNOSIS — M545 Low back pain: Secondary | ICD-10-CM | POA: Insufficient documentation

## 2018-01-31 DIAGNOSIS — M797 Fibromyalgia: Secondary | ICD-10-CM | POA: Insufficient documentation

## 2018-01-31 DIAGNOSIS — Z791 Long term (current) use of non-steroidal anti-inflammatories (NSAID): Secondary | ICD-10-CM | POA: Diagnosis not present

## 2018-01-31 DIAGNOSIS — M79602 Pain in left arm: Secondary | ICD-10-CM | POA: Insufficient documentation

## 2018-01-31 DIAGNOSIS — M47816 Spondylosis without myelopathy or radiculopathy, lumbar region: Secondary | ICD-10-CM | POA: Diagnosis not present

## 2018-01-31 DIAGNOSIS — M25551 Pain in right hip: Secondary | ICD-10-CM | POA: Diagnosis not present

## 2018-01-31 DIAGNOSIS — M5441 Lumbago with sciatica, right side: Secondary | ICD-10-CM

## 2018-01-31 DIAGNOSIS — M533 Sacrococcygeal disorders, not elsewhere classified: Secondary | ICD-10-CM | POA: Diagnosis not present

## 2018-01-31 DIAGNOSIS — M5388 Other specified dorsopathies, sacral and sacrococcygeal region: Secondary | ICD-10-CM

## 2018-01-31 DIAGNOSIS — Z79891 Long term (current) use of opiate analgesic: Secondary | ICD-10-CM | POA: Diagnosis not present

## 2018-01-31 DIAGNOSIS — K529 Noninfective gastroenteritis and colitis, unspecified: Secondary | ICD-10-CM | POA: Insufficient documentation

## 2018-01-31 DIAGNOSIS — M5116 Intervertebral disc disorders with radiculopathy, lumbar region: Secondary | ICD-10-CM | POA: Diagnosis not present

## 2018-01-31 DIAGNOSIS — M62838 Other muscle spasm: Secondary | ICD-10-CM | POA: Insufficient documentation

## 2018-01-31 DIAGNOSIS — M79604 Pain in right leg: Secondary | ICD-10-CM | POA: Insufficient documentation

## 2018-01-31 DIAGNOSIS — G894 Chronic pain syndrome: Secondary | ICD-10-CM | POA: Diagnosis not present

## 2018-01-31 DIAGNOSIS — M5442 Lumbago with sciatica, left side: Secondary | ICD-10-CM

## 2018-01-31 DIAGNOSIS — Z96653 Presence of artificial knee joint, bilateral: Secondary | ICD-10-CM | POA: Diagnosis not present

## 2018-01-31 DIAGNOSIS — M19071 Primary osteoarthritis, right ankle and foot: Secondary | ICD-10-CM | POA: Diagnosis not present

## 2018-01-31 DIAGNOSIS — G8929 Other chronic pain: Secondary | ICD-10-CM

## 2018-01-31 DIAGNOSIS — M79605 Pain in left leg: Secondary | ICD-10-CM | POA: Insufficient documentation

## 2018-01-31 DIAGNOSIS — E7800 Pure hypercholesterolemia, unspecified: Secondary | ICD-10-CM | POA: Insufficient documentation

## 2018-01-31 DIAGNOSIS — M4726 Other spondylosis with radiculopathy, lumbar region: Secondary | ICD-10-CM | POA: Insufficient documentation

## 2018-01-31 DIAGNOSIS — M25562 Pain in left knee: Secondary | ICD-10-CM

## 2018-01-31 DIAGNOSIS — M25552 Pain in left hip: Secondary | ICD-10-CM

## 2018-01-31 DIAGNOSIS — M5414 Radiculopathy, thoracic region: Secondary | ICD-10-CM | POA: Insufficient documentation

## 2018-01-31 DIAGNOSIS — M4810 Ankylosing hyperostosis [Forestier], site unspecified: Secondary | ICD-10-CM | POA: Diagnosis not present

## 2018-01-31 NOTE — Patient Instructions (Addendum)
____________________________________________________________________________________________  Pain Scale  Introduction: The pain score used by this practice is the Verbal Numerical Rating Scale (VNRS-11). This is an 11-point scale. It is for adults and children 10 years or older. There are significant differences in how the pain score is reported, used, and applied. Forget everything you learned in the past and learn this scoring system.  General Information: The scale should reflect your current level of pain. Unless you are specifically asked for the level of your worst pain, or your average pain. If you are asked for one of these two, then it should be understood that it is over the past 24 hours.  Basic Activities of Daily Living (ADL): Personal hygiene, dressing, eating, transferring, and using restroom.  Instructions: Most patients tend to report their level of pain as a combination of two factors, their physical pain and their psychosocial pain. This last one is also known as "suffering" and it is reflection of how physical pain affects you socially and psychologically. From now on, report them separately. From this point on, when asked to report your pain level, report only your physical pain. Use the following table for reference.  Pain Clinic Pain Levels (0-5/10)  Pain Level Score  Description  No Pain 0   Mild pain 1 Nagging, annoying, but does not interfere with basic activities of daily living (ADL). Patients are able to eat, bathe, get dressed, toileting (being able to get on and off the toilet and perform personal hygiene functions), transfer (move in and out of bed or a chair without assistance), and maintain continence (able to control bladder and bowel functions). Blood pressure and heart rate are unaffected. A normal heart rate for a healthy adult ranges from 60 to 100 bpm (beats per minute).   Mild to moderate pain 2 Noticeable and distracting. Impossible to hide from other  people. More frequent flare-ups. Still possible to adapt and function close to normal. It can be very annoying and may have occasional stronger flare-ups. With discipline, patients may get used to it and adapt.   Moderate pain 3 Interferes significantly with activities of daily living (ADL). It becomes difficult to feed, bathe, get dressed, get on and off the toilet or to perform personal hygiene functions. Difficult to get in and out of bed or a chair without assistance. Very distracting. With effort, it can be ignored when deeply involved in activities.   Moderately severe pain 4 Impossible to ignore for more than a few minutes. With effort, patients may still be able to manage work or participate in some social activities. Very difficult to concentrate. Signs of autonomic nervous system discharge are evident: dilated pupils (mydriasis); mild sweating (diaphoresis); sleep interference. Heart rate becomes elevated (>115 bpm). Diastolic blood pressure (lower number) rises above 100 mmHg. Patients find relief in laying down and not moving.   Severe pain 5 Intense and extremely unpleasant. Associated with frowning face and frequent crying. Pain overwhelms the senses.  Ability to do any activity or maintain social relationships becomes significantly limited. Conversation becomes difficult. Pacing back and forth is common, as getting into a comfortable position is nearly impossible. Pain wakes you up from deep sleep. Physical signs will be obvious: pupillary dilation; increased sweating; goosebumps; brisk reflexes; cold, clammy hands and feet; nausea, vomiting or dry heaves; loss of appetite; significant sleep disturbance with inability to fall asleep or to remain asleep. When persistent, significant weight loss is observed due to the complete loss of appetite and sleep deprivation.  Blood   pressure and heart rate becomes significantly elevated. Caution: If elevated blood pressure triggers a pounding headache  associated with blurred vision, then the patient should immediately seek attention at an urgent or emergency care unit, as these may be signs of an impending stroke.    Emergency Department Pain Levels (6-10/10)  Emergency Room Pain 6 Severely limiting. Requires emergency care and should not be seen or managed at an outpatient pain management facility. Communication becomes difficult and requires great effort. Assistance to reach the emergency department may be required. Facial flushing and profuse sweating along with potentially dangerous increases in heart rate and blood pressure will be evident.   Distressing pain 7 Self-care is very difficult. Assistance is required to transport, or use restroom. Assistance to reach the emergency department will be required. Tasks requiring coordination, such as bathing and getting dressed become very difficult.   Disabling pain 8 Self-care is no longer possible. At this level, pain is disabling. The individual is unable to do even the most "basic" activities such as walking, eating, bathing, dressing, transferring to a bed, or toileting. Fine motor skills are lost. It is difficult to think clearly.   Incapacitating pain 9 Pain becomes incapacitating. Thought processing is no longer possible. Difficult to remember your own name. Control of movement and coordination are lost.   The worst pain imaginable 10 At this level, most patients pass out from pain. When this level is reached, collapse of the autonomic nervous system occurs, leading to a sudden drop in blood pressure and heart rate. This in turn results in a temporary and dramatic drop in blood flow to the brain, leading to a loss of consciousness. Fainting is one of the body's self defense mechanisms. Passing out puts the brain in a calmed state and causes it to shut down for a while, in order to begin the healing process.    Summary: 1. Refer to this scale when providing [place] with your pain level. 2. Be  accurate and careful when reporting your pain level. This will help with your care. 3. Over-reporting your pain level will lead to loss of credibility. 4. Even a level of 1/10 means that there is pain and will be treated at our facility. 5. High, inaccurate reporting will be documented as "Symptom Exaggeration", leading to loss of credibility and suspicions of possible secondary gains such as obtaining more narcotics, or wanting to appear disabled, for fraudulent reasons. 6. Only pain levels of 5 or below will be seen at our facility. 7. Pain levels of 6 and above will be sent to the Emergency Department and the appointment cancelled. ____________________________________________________________________________________________   Diffuse idiopathic skeletal hyperostosis (DISH)  Summary  Diffuse = spread out, not confined to a single location Idiopathic = with a cause that is not known Skeletal = having to do with the skeleton Hyperostosis = too much growth of bony tissue Diffuse idiopathic skeletal hyperostosis (DISH) is a form of arthritis that involves the tendons and ligaments around the spine. Also known as Forestier's disease, this condition occurs when these tendons and ligaments become hardened, a process known as calcification. Once the tendons and ligaments harden, parts of these tissues can turn into bone. This usually occurs where the tissue connects with the bone. As a result, bone spurs develop, which is an outgrowth of bone that develop along the edges of a bone. DISH commonly affects the upper part of the back and neck, known as the thoracic and cervical spine. However, DISH can also affect  the shoulders, elbows, hands, knees, hips, heels, and/or ankles.  Symptoms DISH may or may not cause symptoms. If DISH causes symptoms, these may include: Stiffness (most noticeable in the morning or in the evening)  Pain in the back, especially in the upper back  Pain in the shoulders,  elbows, knees or heels  Pain when pressure is applied to the affected area  Difficulty swallowing or a hoarse voice (if DISH has affected the neck)  Loss of range of motion (difficulty moving neck or back)  Tingling, numbness, and/or weakness in the legs Symptoms usually develop when the bone spurs begin to compress the nearby nerves of the spine.  Causes and Risk Factors Although the cause of DISH is unknown, there are certain risk factors that are thought to increase the risk for DISH. These include: Certain endocrine disorders: Conditions such as diabetes mellitus (a condition marked by high blood sugar) and acromegaly (a hormone disorder marked by too much growth hormone in the body) affect the metabolism of cartilage, and as a result, may lead to DISH.  Older age: Since DISH is a type of arthritis, DISH typically affects an older population.  Female gender  Certain medications: Retinoids, including medications used to treat severe acne, can increase a person's risk for developing DISH.  Tests and Diagnosis If a patient presents with symptoms associated with DISH, the doctor may perform a physical examination of the spine. The doctor can often make a diagnosis based on signs and symptoms. To confirm a diagnosis of DISH, an X-ray is often performed, which uses invisible electromagnetic energy beams to produce images of internal tissues, bones, and organs onto film. To rule out other conditions, the doctor may order the following diagnostic procedures: X-ray (also known as plain films) -test that uses invisible electromagnetic energy beams (X-rays) to produce images of bones. Soft tissue structures such as the spinal cord, spinal nerves, the disc and ligaments are usually not seen on X-rays, nor on most tumors, vascular malformations, or cysts. X-rays provide an overall assessment of the bone anatomy as well as the curvature and alignment of the vertebral column. Spinal dislocation or slippage  (also known as spondylolisthesis), kyphosis, scoliosis, as well as local and overall spine balance can be assessed with X-rays. Specific bony abnormalities such as bone spurs, disc space narrowing, vertebral body fracture, collapse or erosion can also be identified on plain film X-rays. Dynamic, or flexion/extension X-rays (X-rays that show the spine in motion) may be obtained to see if there is any abnormal or excessive movement or instability in the spine at the affected levels. Computed tomography scan (CT scan) - a diagnostic imaging procedure that uses a combination of x-rays and computer technology to produce detailed images of the body. A CT scan shows detailed images of any part of the body, including the bones, muscles, fat, and organs. CT scans are more detailed than general x-rays.  Magnetic resonance imaging scan (MRI) - a diagnostic procedure that uses a combination of large magnets, radiofrequencies, and a computer to produce detailed images of organs and structures within the body.  Treatments Usually, treatment involves anti-inflammatory drugs, including non-steroid anti-inflammatory drugs (NSAIDS). Treatment will also typically include physical therapy to help reduce stiffness. Since there is a connection between endocrine disorders such as diabetes mellitus and DISH, addressing the underlying condition can help stop the progression of DISH. In some cases, surgery may be needed to correct structural problems in the spine. Surgery may also be an option for those  patients who have difficulty swallowing as a result of bone spurs in the neck. If DISH has resulted in bone spurs in and around the spine, and if these bony growths begin to compress the spinal cord and/or nerve roots, surgery to decompress the spinal cord may be required. In addition, if DISH has resulted in fractures, surgery is required to repair the fracture. If these fractures begin to compress the spinal cord and/or nerve roots,  surgery to decompress the spinal cord may be necessary. The surgeon may elect to perform any of the following surgical procedures to remove the pressure on the spinal cord and/or nerve roots: laminectomy  laminoplasty  corpectomy  discectomy In some cases, the surgeon may perform a spinal fusion to ensure the spinal column is stable after surgery. During a spinal fusion, the surgeon may place a bone in the open space and allow the bones to fuse together (fusion).  ____________________________________________________________________________________________  Preparing for Procedure with Sedation  Instructions: . Oral Intake: Do not eat or drink anything for at least 8 hours prior to your procedure. . Transportation: Public transportation is not allowed. Bring an adult driver. The driver must be physically present in our waiting room before any procedure can be started. [name] Physical Assistance: Bring an adult physically capable of assisting you, in the event you need help. This adult should keep you company at home for at least 6 hours after the procedure. . Blood Pressure Medicine: Take your blood pressure medicine with a sip of water the morning of the procedure. . Blood thinners:  . Diabetics on insulin: Notify the staff so that you can be scheduled 1st case in the morning. If your diabetes requires high dose insulin, take only  of your normal insulin dose the morning of the procedure and notify the staff that you have done so. . Preventing infections: Shower with an antibacterial soap the morning of your procedure. . Build-up your immune system: Take 1000 mg of Vitamin C with every meal (3 times a day) the day prior to your procedure. [name] Antibiotics: Inform the staff if you have a condition or reason that requires you to take antibiotics before dental procedures. . Pregnancy: If you are pregnant, call and cancel the procedure. . Sickness: If you have a cold, fever, or any active infections,  call and cancel the procedure. . Arrival: You must be in the facility at least 30 minutes prior to your scheduled procedure. . Children: Do not bring children with you. . Dress appropriately: Bring dark clothing that you would not mind if they get stained. . Valuables: Do not bring any jewelry or valuables.  Procedure appointments are reserved for interventional treatments only. [name] No Prescription Refills. . No medication changes will be discussed during procedure appointments. . No disability issues will be discussed.  Remember:  Regular Business hours are:  Monday to Thursday 8:00 AM to 4:00 PM  Provider's Schedule: Delano Metz, MD:  Procedure days: Tuesday and Thursday 7:30 AM to 4:00 PM  Edward Jolly, MD:  Procedure days: Monday and Wednesday 7:30 AM to 4:00 PM ____________________________________________________________________________________________   Sacroiliac (SI) Joint Injection Patient Information  Description: The sacroiliac joint connects the scrum (very low back and tailbone) to the ilium (a pelvic bone which also forms half of the hip joint).  Normally this joint experiences very little motion.  When this joint becomes inflamed or unstable low back and or hip and pelvis pain may result.  Injection of this joint with local anesthetics (numbing  medicines) and steroids can provide diagnostic information and reduce pain.  This injection is performed with the aid of x-ray guidance into the tailbone area while you are lying on your stomach.   You may experience an electrical sensation down the leg while this is being done.  You may also experience numbness.  We also may ask if we are reproducing your normal pain during the injection.  Conditions which may be treated SI injection:   Low back, buttock, hip or leg pain  Preparation for the Injection:  1. Do not eat any solid food or dairy products within 8 hours of your appointment.  2. You may drink clear liquids up  to 3 hours before appointment.  Clear liquids include water, black coffee, juice or soda.  No milk or cream please. 3. You may take your regular medications, including pain medications with a sip of water before your appointment.  Diabetics should hold regular insulin (if take separately) and take 1/2 normal NPH dose the morning of the procedure.  Carry some sugar containing items with you to your appointment. 4. A driver must accompany you and be prepared to drive you home after your procedure. 5. Bring all of your current medications with you. 6. An IV may be inserted and sedation may be given at the discretion of the physician. 7. A blood pressure cuff, EKG and other monitors will often be applied during the procedure.  Some patients may need to have extra oxygen administered for a short period.  8. You will be asked to provide medical information, including your allergies, prior to the procedure.  We must know immediately if you are taking blood thinners (like Coumadin/Warfarin) or if you are allergic to IV iodine contrast (dye).  We must know if you could possible be pregnant.  Possible side effects:   Bleeding from needle site  Infection (rare, may require surgery)  Nerve injury (rare)  Numbness & tingling (temporary)  A brief convulsion or seizure  Light-headedness (temporary)  Pain at injection site (several days)  Decreased blood pressure (temporary)  Weakness in the leg (temporary)   Call if you experience:   New onset weakness or numbness of an extremity below the injection site that last more than 8 hours.  Hives or difficulty breathing ( go to the emergency room)  Inflammation or drainage at the injection site  Any new symptoms which are concerning to you  Please note:  Although the local anesthetic injected can often make your back/ hip/ buttock/ leg feel good for several hours after the injections, the pain will likely return.  It takes 3-7 days for steroids  to work in the sacroiliac area.  You may not notice any pain relief for at least that one week.  If effective, we will often do a series of three injections spaced 3-6 weeks apart to maximally decrease your pain.  After the initial series, we generally will wait some months before a repeat injection of the same type.  If you have any questions, please call [phone] [facility]  Facet Blocks Patient Information  Description: The facets are joints in the spine between the vertebrae.  Like any joints in the body, facets can become irritated and painful.  Arthritis can also effect the facets.  By injecting steroids and local anesthetic in and around these joints, we can temporarily block the nerve supply to them.  Steroids act directly on irritated nerves and tissues to reduce selling and  inflammation which often leads to decreased pain.  Facet blocks may be done anywhere along the spine from the neck to the low back depending upon the location of your pain.   After numbing the skin with local anesthetic (like Novocaine), a small needle is passed onto the facet joints under x-ray guidance.  You may experience a sensation of pressure while this is being done.  The entire block usually lasts about 15-25 minutes.   Conditions which may be treated by facet blocks:   Low back/buttock pain  Neck/shoulder pain  Certain types of headaches  Preparation for the injection:  10. Do not eat any solid food or dairy products within 8 hours of your appointment. 11. You may drink clear liquid up to 3 hours before appointment.  Clear liquids include water, black coffee, juice or soda.  No milk or cream please. 12. You may take your regular medication, including pain medications, with a sip of water before your appointment.  Diabetics should hold regular insulin (if taken separately) and take 1/2 normal NPH dose the morning of the procedure.  Carry some sugar containing  items with you to your appointment. 13. A driver must accompany you and be prepared to drive you home after your procedure. 14. Bring all your current medications with you. 15. An IV may be inserted and sedation may be given at the discretion of the physician. 16. A blood pressure cuff, EKG and other monitors will often be applied during the procedure.  Some patients may need to have extra oxygen administered for a short period. 17. You will be asked to provide medical information, including your allergies and medications, prior to the procedure.  We must know immediately if you are taking blood thinners (like Coumadin/Warfarin) or if you are allergic to IV iodine contrast (dye).  We must know if you could possible be pregnant.  Possible side-effects:   Bleeding from needle site  Infection (rare, may require surgery)  Nerve injury (rare)  Numbness & tingling (temporary)  Difficulty urinating (rare, temporary)  Spinal headache (a headache worse with upright posture)  Light-headedness (temporary)  Pain at injection site (serveral days)  Decreased blood pressure (rare, temporary)  Weakness in arm/leg (temporary)  Pressure sensation in back/neck (temporary)   Call if you experience:   Fever/chills associated with headache or increased back/neck pain  Headache worsened by an upright position  New onset, weakness or numbness of an extremity below the injection site  Hives or difficulty breathing (go to the emergency room)  Inflammation or drainage at the injection site(s)  Severe back/neck pain greater than usual  New symptoms which are concerning to you  Please note:  Although the local anesthetic injected can often make your back or neck feel good for several hours after the injection, the pain will likely return. It takes 3-7 days for steroids to work.  You may not notice any pain relief for at least one week.  If effective, we will often do a series of 2-3  injections spaced 3-6 weeks apart to maximally decrease your pain.  After the initial series, you may be a candidate for a more permanent nerve block of the facets.  If you have any questions, please call #[phone] [facility]

## 2018-02-09 DIAGNOSIS — I10 Essential (primary) hypertension: Secondary | ICD-10-CM | POA: Diagnosis not present

## 2018-02-09 DIAGNOSIS — M1990 Unspecified osteoarthritis, unspecified site: Secondary | ICD-10-CM | POA: Diagnosis not present

## 2018-02-09 DIAGNOSIS — J320 Chronic maxillary sinusitis: Secondary | ICD-10-CM | POA: Diagnosis not present

## 2018-02-09 DIAGNOSIS — Z6835 Body mass index (BMI) 35.0-35.9, adult: Secondary | ICD-10-CM | POA: Diagnosis not present

## 2018-02-09 DIAGNOSIS — F419 Anxiety disorder, unspecified: Secondary | ICD-10-CM | POA: Diagnosis not present

## 2018-02-09 DIAGNOSIS — Z299 Encounter for prophylactic measures, unspecified: Secondary | ICD-10-CM | POA: Diagnosis not present

## 2018-02-11 DIAGNOSIS — G4733 Obstructive sleep apnea (adult) (pediatric): Secondary | ICD-10-CM | POA: Diagnosis not present

## 2018-02-15 ENCOUNTER — Encounter: Payer: Self-pay | Admitting: Pain Medicine

## 2018-02-15 ENCOUNTER — Ambulatory Visit
Admission: RE | Admit: 2018-02-15 | Discharge: 2018-02-15 | Disposition: A | Discharge status: Home / Self Care | Payer: PPO | Source: Ambulatory Visit | Attending: Pain Medicine | Admitting: Pain Medicine

## 2018-02-15 ENCOUNTER — Ambulatory Visit (HOSPITAL_BASED_OUTPATIENT_CLINIC_OR_DEPARTMENT_OTHER): Payer: PPO | Admitting: Pain Medicine

## 2018-02-15 ENCOUNTER — Other Ambulatory Visit: Payer: Self-pay

## 2018-02-15 VITALS — BP 128/79 | HR 93 | Temp 97.7°F | Resp 18 | Ht 63.0 in | Wt 205.0 lb

## 2018-02-15 DIAGNOSIS — M5388 Other specified dorsopathies, sacral and sacrococcygeal region: Secondary | ICD-10-CM | POA: Diagnosis not present

## 2018-02-15 DIAGNOSIS — G8929 Other chronic pain: Secondary | ICD-10-CM | POA: Insufficient documentation

## 2018-02-15 DIAGNOSIS — M47817 Spondylosis without myelopathy or radiculopathy, lumbosacral region: Secondary | ICD-10-CM | POA: Insufficient documentation

## 2018-02-15 DIAGNOSIS — Z886 Allergy status to analgesic agent status: Secondary | ICD-10-CM | POA: Insufficient documentation

## 2018-02-15 DIAGNOSIS — Z888 Allergy status to other drugs, medicaments and biological substances status: Secondary | ICD-10-CM | POA: Insufficient documentation

## 2018-02-15 DIAGNOSIS — M4810 Ankylosing hyperostosis [Forestier], site unspecified: Secondary | ICD-10-CM

## 2018-02-15 DIAGNOSIS — M5441 Lumbago with sciatica, right side: Secondary | ICD-10-CM

## 2018-02-15 DIAGNOSIS — M533 Sacrococcygeal disorders, not elsewhere classified: Secondary | ICD-10-CM | POA: Diagnosis not present

## 2018-02-15 DIAGNOSIS — M5442 Lumbago with sciatica, left side: Secondary | ICD-10-CM

## 2018-02-15 DIAGNOSIS — M47816 Spondylosis without myelopathy or radiculopathy, lumbar region: Secondary | ICD-10-CM

## 2018-02-15 MED ORDER — METHYLPREDNISOLONE ACETATE 80 MG/ML IJ SUSP
80.0000 mg | Freq: Once | INTRAMUSCULAR | Status: AC
  Administered 2018-02-15: 80 mg via INTRA_ARTICULAR
  Filled 2018-02-15: qty 1

## 2018-02-15 MED ORDER — MIDAZOLAM HCL 5 MG/5ML IJ SOLN
1.0000 mg | INTRAMUSCULAR | Status: DC | PRN
  Administered 2018-02-15: 3 mg via INTRAVENOUS
  Filled 2018-02-15 (×2): qty 5

## 2018-02-15 MED ORDER — LACTATED RINGERS IV SOLN
1000.0000 mL | Freq: Once | INTRAVENOUS | Status: AC
  Administered 2018-02-15: 1000 mL via INTRAVENOUS

## 2018-02-15 MED ORDER — LIDOCAINE HCL 2 % IJ SOLN
20.0000 mL | Freq: Once | INTRAMUSCULAR | Status: AC
  Administered 2018-02-15: 400 mg
  Filled 2018-02-15: qty 20

## 2018-02-15 MED ORDER — METHYLPREDNISOLONE ACETATE 80 MG/ML IJ SUSP
INTRAMUSCULAR | Status: AC
  Filled 2018-02-15: qty 1

## 2018-02-15 MED ORDER — ROPIVACAINE HCL 2 MG/ML IJ SOLN
4.0000 mL | Freq: Once | INTRAMUSCULAR | Status: AC
  Administered 2018-02-15: 10 mL via INTRA_ARTICULAR
  Filled 2018-02-15: qty 10

## 2018-02-15 MED ORDER — ROPIVACAINE HCL 2 MG/ML IJ SOLN
INTRAMUSCULAR | Status: AC
  Filled 2018-02-15: qty 20

## 2018-02-15 MED ORDER — FENTANYL CITRATE (PF) 100 MCG/2ML IJ SOLN
25.0000 ug | INTRAMUSCULAR | Status: DC | PRN
  Administered 2018-02-15: 100 ug via INTRAVENOUS
  Filled 2018-02-15 (×2): qty 2

## 2018-02-15 MED ORDER — TRIAMCINOLONE ACETONIDE 40 MG/ML IJ SUSP
INTRAMUSCULAR | Status: AC
  Filled 2018-02-15: qty 2

## 2018-02-15 MED ORDER — TRIAMCINOLONE ACETONIDE 40 MG/ML IJ SUSP
80.0000 mg | Freq: Once | INTRAMUSCULAR | Status: AC
  Administered 2018-02-15: 40 mg
  Filled 2018-02-15: qty 2

## 2018-02-15 MED ORDER — ROPIVACAINE HCL 2 MG/ML IJ SOLN
18.0000 mL | Freq: Once | INTRAMUSCULAR | Status: AC
  Administered 2018-02-15: 10 mL via PERINEURAL
  Filled 2018-02-15: qty 20

## 2018-02-15 MED ORDER — LIDOCAINE HCL 2 % IJ SOLN
INTRAMUSCULAR | Status: AC
  Filled 2018-02-15: qty 20

## 2018-02-15 MED ORDER — ROPIVACAINE HCL 2 MG/ML IJ SOLN
INTRAMUSCULAR | Status: AC
  Filled 2018-02-15: qty 10

## 2018-02-15 NOTE — Progress Notes (Signed)
Patient's Name: Jennifer Harmon  MRN: [id number]  Referring Provider: Ignatius Specking, MD  DOB: 1961-05-14  PCP: Ignatius Specking, MD  DOS: 02/15/2018  Note by: Oswaldo Done, MD  Service setting: Ambulatory outpatient  Specialty: Interventional Pain Management  Patient type: Established  Location: [facility] (AMB) Pain Management Facility  Visit type: Interventional Procedure   Primary Reason for Visit: Interventional Pain Management Treatment. CC: Back Pain  Procedure:       Anesthesia, Analgesia, Anxiolysis:  Procedure #1: Type: Medial Branch Facet Block #2 Primary Purpose: Diagnostic Region: Lumbar Level: L2, L3, L4, L5, & S1 Medial Branch Level(s) Target Area: For Lumbar Facet blocks, the target is the groove formed by the junction of the transverse process and superior articular process. For the L5 dorsal ramus, the target is the notch between superior articular process and sacral ala. For the S1 dorsal ramus, the target is the superior and lateral edge of the posterior S1 Sacral foramen. Approach: Posterior, paramedial, percutaneous approach. Laterality: Bilateral Position: Prone  Procedure #2: Type: Sacroiliac Joint Block #2  Primary Purpose: Diagnostic Region: Posterior Lumbosacral Level: PSIS (Posterior Superior Iliac Spine) Sacroiliac Joint Target Area: For upper sacroiliac joint block(s), the target is the superior and posterior margin of the sacroiliac joint. Approach: Ipsilateral approach. Laterality: Right Position: Prone  Type: Moderate (Conscious) Sedation combined with Local Anesthesia Indication(s): Analgesia and Anxiety Route: Intravenous (IV) IV Access: Secured Sedation: Meaningful verbal contact was maintained at all times during the procedure  Local Anesthetic: Lidocaine 1-2%   Indications: 1. Spondylosis without myelopathy or radiculopathy, lumbosacral region   2. Lumbar facet syndrome (Bilateral) (R>L)   3. Other specified dorsopathies, sacral and  sacrococcygeal region   4. Chronic sacroiliac joint pain (Bilateral) (R>L)   5. Chronic low back pain  (Secondary Area of Pain) (Bilateral) (R>L)    Pain Score: Pre-procedure: 7 /10 Post-procedure: 0-No pain/10  Pre-op Assessment:  Jennifer Harmon is a 57 y.o. (year old), female patient, seen today for interventional treatment. She  has a past surgical history that includes Cholecystectomy; Cesarean section; and Back surgery (2008). Jennifer Harmon has a current medication list which includes the following prescription(s): citalopram, clonazepam, cyclobenzaprine, latanoprost, lyrica, magnesium oxide, oxycodone, oxycodone, ranitidine, and oxycodone, and the following Facility-Administered Medications: fentanyl, lactated ringers, and midazolam. Her primarily concern today is the Back Pain  Initial Vital Signs:  Pulse Rate: 93 Temp: 98 F (36.7 C) Resp: 16 BP: (!) 142/88 SpO2: 98 %  BMI: Estimated body mass index is 36.31 kg/m as calculated from the following:   Height as of this encounter: 5\' 3"  (1.6 m).   Weight as of this encounter: 205 lb (93 kg).  Risk Assessment: Allergies: Reviewed. She is allergic to asa [aspirin]; bupropion; and tape.  Allergy Precautions: None required Coagulopathies: Reviewed. None identified.  Blood-thinner therapy: None at this time Active Infection(s): Reviewed. None identified. Jennifer Harmon is afebrile  Site Confirmation: Jennifer Harmon was asked to confirm the procedure and laterality before marking the site Procedure checklist: Completed Consent: Before the procedure and under the influence of no sedative(s), amnesic(s), or anxiolytics, the patient was informed of the treatment options, risks and possible complications. To fulfill our ethical and legal obligations, as recommended by the American Medical Association's Code of Ethics, I have informed the patient of my clinical impression; the nature and purpose of the treatment or procedure; the risks, benefits, and  possible complications of the intervention; the alternatives, including doing nothing; the risk(s) and benefit(s) of the  alternative treatment(s) or procedure(s); and the risk(s) and benefit(s) of doing nothing. The patient was provided information about the general risks and possible complications associated with the procedure. These may include, but are not limited to: failure to achieve desired goals, infection, bleeding, organ or nerve damage, allergic reactions, paralysis, and death. In addition, the patient was informed of those risks and complications associated to Spine-related procedures, such as failure to decrease pain; infection (i.e.: Meningitis, epidural or intraspinal abscess); bleeding (i.e.: epidural hematoma, subarachnoid hemorrhage, or any other type of intraspinal or peri-dural bleeding); organ or nerve damage (i.e.: Any type of peripheral nerve, nerve root, or spinal cord injury) with subsequent damage to sensory, motor, and/or autonomic systems, resulting in permanent pain, numbness, and/or weakness of one or several areas of the body; allergic reactions; (i.e.: anaphylactic reaction); and/or death. Furthermore, the patient was informed of those risks and complications associated with the medications. These include, but are not limited to: allergic reactions (i.e.: anaphylactic or anaphylactoid reaction(s)); adrenal axis suppression; blood sugar elevation that in diabetics may result in ketoacidosis or comma; water retention that in patients with history of congestive heart failure may result in shortness of breath, pulmonary edema, and decompensation with resultant heart failure; weight gain; swelling or edema; medication-induced neural toxicity; particulate matter embolism and blood vessel occlusion with resultant organ, and/or nervous system infarction; and/or aseptic necrosis of one or more joints. Finally, the patient was informed that Medicine is not an exact science; therefore, there  is also the possibility of unforeseen or unpredictable risks and/or possible complications that may result in a catastrophic outcome. The patient indicated having understood very clearly. We have given the patient no guarantees and we have made no promises. Enough time was given to the patient to ask questions, all of which were answered to the patient's satisfaction. Ms. Abaya has indicated that she wanted to continue with the procedure. Attestation: I, the ordering provider, attest that I have discussed with the patient the benefits, risks, side-effects, alternatives, likelihood of achieving goals, and potential problems during recovery for the procedure that I have provided informed consent. Date  Time: 02/15/2018  1:42 PM  Pre-Procedure Preparation:  Monitoring: As per clinic protocol. Respiration, ETCO2, SpO2, BP, heart rate and rhythm monitor placed and checked for adequate function Safety Precautions: Patient was assessed for positional comfort and pressure points before starting the procedure. Time-out: I initiated and conducted the "Time-out" before starting the procedure, as per protocol. The patient was asked to participate by confirming the accuracy of the "Time Out" information. Verification of the correct person, site, and procedure were performed and confirmed by me, the nursing staff, and the patient. "Time-out" conducted as per Joint Commission's Universal Protocol (UP.01.01.01). Time: 1454  Description of Procedure #1 Process:   Time-out: "Time-out" completed before starting procedure, as per protocol. Area Prepped: Entire Posterior Lumbosacral Region Prepping solution: ChloraPrep (2% chlorhexidine gluconate and 70% isopropyl alcohol) Safety Precautions: Aspiration looking for blood return was conducted prior to all injections. At no point did we inject any substances, as a needle was being advanced. No attempts were made at seeking any paresthesias. Safe injection practices and  needle disposal techniques used. Medications properly checked for expiration dates. SDV (single dose vial) medications used.  Description of the Procedure: Protocol guidelines were followed. The patient was placed in position over the fluoroscopy table. The target area was identified and the area prepped in the usual manner. Skin desensitized using vapocoolant spray. Skin & deeper tissues infiltrated with local  anesthetic. Appropriate amount of time allowed to pass for local anesthetics to take effect. The procedure needle was introduced through the skin, ipsilateral to the reported pain, and advanced to the target area. Employing the "Medial Branch Technique", the needles were advanced to the angle made by the superior and medial portion of the transverse process, and the lateral and inferior portion of the superior articulating process of the targeted vertebral bodies. This area is known as "Burton's Eye" or the "Eye of the [place] Dog". A procedure needle was introduced through the skin, and this time advanced to the angle made by the superior and medial border of the sacral ala, and the lateral border of the S1 vertebral body. This last needle was later repositioned at the superior and lateral border of the posterior S1 foramen. Negative aspiration confirmed. Solution injected in intermittent fashion, asking for systemic symptoms every 0.5cc of injectate. The needles were then removed and the area cleansed, making sure to leave some of the prepping solution back to take advantage of its long term bactericidal properties. Start Time: 1454 hrs. Materials:  Needle(s) Type: Regular needle Gauge: 22G Length: 3.5-in Medication(s): Please see orders for medications and dosing details.  Description of Procedure # 2 Process:   Area Prepped: Entire Posterior Lumbosacral Region Prepping solution: ChloraPrep (2% chlorhexidine gluconate and 70% isopropyl alcohol) Safety Precautions: Aspiration looking for blood  return was conducted prior to all injections. At no point did we inject any substances, as a needle was being advanced. No attempts were made at seeking any paresthesias. Safe injection practices and needle disposal techniques used. Medications properly checked for expiration dates. SDV (single dose vial) medications used. Description of the Procedure: Protocol guidelines were followed. The patient was placed in position over the fluoroscopy table. The target area was identified and the area prepped in the usual manner. Skin desensitized using vapocoolant spray. Skin & deeper tissues infiltrated with local anesthetic. Appropriate amount of time allowed to pass for local anesthetics to take effect. The procedure needle was advanced under fluoroscopic guidance into the sacroiliac joint until a firm endpoint was obtained. Proper needle placement secured. Negative aspiration confirmed. Solution injected in intermittent fashion, asking for systemic symptoms every 0.5cc of injectate. The needles were then removed and the area cleansed, making sure to leave some of the prepping solution back to take advantage of its long term bactericidal properties. Vitals:   02/15/18 1513 02/15/18 1521 02/15/18 1531 02/15/18 1540  BP: 125/60 139/90 (!) 88/61 128/79  Pulse:      Resp: 16 11 15 18   Temp:  97.7 F (36.5 C)    TempSrc:  Temporal    SpO2: 99% 99% 95% 95%  Weight:      Height:        End Time: 1512 hrs. Materials:  Needle(s) Type: Regular needle Gauge: 22G Length: 3.5-in Medication(s): Please see orders for medications and dosing details.  Imaging Guidance (Spinal):  Type of Imaging Technique: Fluoroscopy Guidance (Spinal) Indication(s): Assistance in needle guidance and placement for procedures requiring needle placement in or near specific anatomical locations not easily accessible without such assistance. Exposure Time: Please see nurses notes. Contrast: None used. Fluoroscopic Guidance: I was  personally present during the use of fluoroscopy. "Tunnel Vision Technique" used to obtain the best possible view of the target area. Parallax error corrected before commencing the procedure. "Direction-depth-direction" technique used to introduce the needle under continuous pulsed fluoroscopy. Once target was reached, antero-posterior, oblique, and lateral fluoroscopic projection used confirm needle  placement in all planes. Images permanently stored in EMR. Interpretation: No contrast injected. I personally interpreted the imaging intraoperatively. Adequate needle placement confirmed in multiple planes. Permanent images saved into the patient's record.  Antibiotic Prophylaxis:   Anti-infectives (From admission, onward)   None     Indication(s): None identified  Post-operative Assessment:  Post-procedure Vital Signs:  Pulse Rate: 93 Temp: 97.7 F (36.5 C) Resp: 18 BP: 128/79 SpO2: 95 %  EBL: None  Complications: No immediate post-treatment complications observed by team, or reported by patient.  Note: The patient tolerated the entire procedure well. A repeat set of vitals were taken after the procedure and the patient was kept under observation following institutional policy, for this type of procedure. Post-procedural neurological assessment was performed, showing return to baseline, prior to discharge. The patient was provided with post-procedure discharge instructions, including a section on how to identify potential problems. Should any problems arise concerning this procedure, the patient was given instructions to immediately contact us, at any time, without hesitation. In any case, we plan to contact the patient by telephone for a follow-up status report regarding this interventional procedure.  Comments:  No additional relevant information.  Plan of Care    Imaging Orders     DG C-Arm 1-60 Min-No Report  Procedure Orders     LUMBAR FACET(MEDIAL BRANCH NERVE BLOCK) MBNB      SACROILIAC JOINT INJECTION  Medications ordered for procedure: Meds ordered this encounter  Medications  . lidocaine (XYLOCAINE) 2 % (with pres) injection 400 mg  . midazolam (VERSED) 5 MG/5ML injection 1-2 mg    Make sure Flumazenil is available in the pyxis when using this medication. If oversedation occurs, administer 0.2 mg IV over 15 sec. If after 45 sec no response, administer 0.2 mg again over 1 min; may repeat at 1 min intervals; not to exceed 4 doses (1 mg)  . fentaNYL (SUBLIMAZE) injection 25-50 mcg    Make sure Narcan is available in the pyxis when using this medication. In the event of respiratory depression (RR< 8/min): Titrate NARCAN (naloxone) in increments of 0.1 to 0.2 mg IV at 2-3 minute intervals, until desired degree of reversal.  . lactated ringers infusion 1,000 mL  . ropivacaine (PF) 2 mg/mL (0.2%) (NAROPIN) injection 18 mL  . triamcinolone acetonide (KENALOG-40) injection 80 mg  . ropivacaine (PF) 2 mg/mL (0.2%) (NAROPIN) injection 4 mL  . methylPREDNISolone acetate (DEPO-MEDROL) injection 80 mg   Medications administered: We administered lidocaine, midazolam, fentaNYL, lactated ringers, ropivacaine (PF) 2 mg/mL (0.2%), triamcinolone acetonide, ropivacaine (PF) 2 mg/mL (0.2%), and methylPREDNISolone acetate.  See the medical record for exact dosing, route, and time of administration.  New Prescriptions   No medications on file   Disposition: Discharge home  Discharge Date & Time: 02/15/2018; 1548 hrs.   Physician-requested Follow-up: Return for post-procedure eval (2 wks), w/ Dr. Laban Emperor.  Future Appointments  Date Time Provider Department Center  03/14/2018  1:30 PM Delano Metz, MD [facility] None  03/22/2018  8:45 AM Barbette Merino, NP [facility] None  08/24/2018  9:15 AM Pollyann Savoy, MD [facility] None   Primary Care Physician: Ignatius Specking, MD Location: [facility] Note by: Oswaldo Done, MD Date: 02/15/2018;  Time: 4:04 PM  Disclaimer:  Medicine is not an Visual merchandiser. The only guarantee in medicine is that nothing is guaranteed. It is important to note that the decision to proceed with this intervention was based on the information collected from the patient.  The Data and conclusions were drawn from the patient's questionnaire, the interview, and the physical examination. Because the information was provided in large part by the patient, it cannot be guaranteed that it has not been purposely or unconsciously manipulated. Every effort has been made to obtain as much relevant data as possible for this evaluation. It is important to note that the conclusions that lead to this procedure are derived in large part from the available data. Always take into account that the treatment will also be dependent on availability of resources and existing treatment guidelines, considered by other Pain Management Practitioners as being common knowledge and practice, at the time of the intervention. For Medico-Legal purposes, it is also important to point out that variation in procedural techniques and pharmacological choices are the acceptable norm. The indications, contraindications, technique, and results of the above procedure should only be interpreted and judged by a Board-Certified Interventional Pain Specialist with extensive familiarity and expertise in the same exact procedure and technique.

## 2018-02-15 NOTE — Patient Instructions (Signed)

## 2018-02-16 ENCOUNTER — Telehealth: Payer: Self-pay

## 2018-02-16 NOTE — Telephone Encounter (Signed)
Post procedure phone call.  Patient states she is doing well.  

## 2018-03-13 DIAGNOSIS — G4733 Obstructive sleep apnea (adult) (pediatric): Secondary | ICD-10-CM | POA: Diagnosis not present

## 2018-03-14 ENCOUNTER — Other Ambulatory Visit: Payer: Self-pay

## 2018-03-14 ENCOUNTER — Ambulatory Visit: Payer: PPO | Attending: Pain Medicine | Admitting: Pain Medicine

## 2018-03-14 ENCOUNTER — Encounter: Payer: Self-pay | Admitting: Pain Medicine

## 2018-03-14 VITALS — BP 139/75 | HR 100 | Temp 98.3°F | Ht 63.0 in | Wt 204.0 lb

## 2018-03-14 DIAGNOSIS — N62 Hypertrophy of breast: Secondary | ICD-10-CM | POA: Diagnosis not present

## 2018-03-14 DIAGNOSIS — M545 Low back pain: Secondary | ICD-10-CM | POA: Diagnosis not present

## 2018-03-14 DIAGNOSIS — M792 Neuralgia and neuritis, unspecified: Secondary | ICD-10-CM | POA: Diagnosis not present

## 2018-03-14 DIAGNOSIS — M542 Cervicalgia: Secondary | ICD-10-CM | POA: Insufficient documentation

## 2018-03-14 DIAGNOSIS — M47817 Spondylosis without myelopathy or radiculopathy, lumbosacral region: Secondary | ICD-10-CM | POA: Diagnosis not present

## 2018-03-14 DIAGNOSIS — M5414 Radiculopathy, thoracic region: Secondary | ICD-10-CM | POA: Insufficient documentation

## 2018-03-14 DIAGNOSIS — M25562 Pain in left knee: Secondary | ICD-10-CM | POA: Diagnosis not present

## 2018-03-14 DIAGNOSIS — F329 Major depressive disorder, single episode, unspecified: Secondary | ICD-10-CM | POA: Insufficient documentation

## 2018-03-14 DIAGNOSIS — M62838 Other muscle spasm: Secondary | ICD-10-CM | POA: Diagnosis not present

## 2018-03-14 DIAGNOSIS — M5388 Other specified dorsopathies, sacral and sacrococcygeal region: Secondary | ICD-10-CM | POA: Diagnosis not present

## 2018-03-14 DIAGNOSIS — M5441 Lumbago with sciatica, right side: Secondary | ICD-10-CM | POA: Diagnosis not present

## 2018-03-14 DIAGNOSIS — Z96653 Presence of artificial knee joint, bilateral: Secondary | ICD-10-CM | POA: Diagnosis not present

## 2018-03-14 DIAGNOSIS — M4810 Ankylosing hyperostosis [Forestier], site unspecified: Secondary | ICD-10-CM | POA: Insufficient documentation

## 2018-03-14 DIAGNOSIS — Z79899 Other long term (current) drug therapy: Secondary | ICD-10-CM | POA: Insufficient documentation

## 2018-03-14 DIAGNOSIS — M5442 Lumbago with sciatica, left side: Secondary | ICD-10-CM | POA: Diagnosis not present

## 2018-03-14 DIAGNOSIS — K529 Noninfective gastroenteritis and colitis, unspecified: Secondary | ICD-10-CM | POA: Diagnosis not present

## 2018-03-14 DIAGNOSIS — M170 Bilateral primary osteoarthritis of knee: Secondary | ICD-10-CM | POA: Insufficient documentation

## 2018-03-14 DIAGNOSIS — G894 Chronic pain syndrome: Secondary | ICD-10-CM | POA: Insufficient documentation

## 2018-03-14 DIAGNOSIS — M25511 Pain in right shoulder: Secondary | ICD-10-CM | POA: Insufficient documentation

## 2018-03-14 DIAGNOSIS — G8929 Other chronic pain: Secondary | ICD-10-CM | POA: Diagnosis not present

## 2018-03-14 DIAGNOSIS — M797 Fibromyalgia: Secondary | ICD-10-CM

## 2018-03-14 DIAGNOSIS — M5116 Intervertebral disc disorders with radiculopathy, lumbar region: Secondary | ICD-10-CM | POA: Insufficient documentation

## 2018-03-14 DIAGNOSIS — Z791 Long term (current) use of non-steroidal anti-inflammatories (NSAID): Secondary | ICD-10-CM | POA: Diagnosis not present

## 2018-03-14 DIAGNOSIS — Z87891 Personal history of nicotine dependence: Secondary | ICD-10-CM | POA: Diagnosis not present

## 2018-03-14 DIAGNOSIS — M79602 Pain in left arm: Secondary | ICD-10-CM | POA: Diagnosis not present

## 2018-03-14 DIAGNOSIS — Z886 Allergy status to analgesic agent status: Secondary | ICD-10-CM | POA: Insufficient documentation

## 2018-03-14 DIAGNOSIS — M79604 Pain in right leg: Secondary | ICD-10-CM | POA: Insufficient documentation

## 2018-03-14 DIAGNOSIS — F419 Anxiety disorder, unspecified: Secondary | ICD-10-CM | POA: Diagnosis not present

## 2018-03-14 DIAGNOSIS — M25512 Pain in left shoulder: Secondary | ICD-10-CM | POA: Diagnosis not present

## 2018-03-14 DIAGNOSIS — K219 Gastro-esophageal reflux disease without esophagitis: Secondary | ICD-10-CM | POA: Insufficient documentation

## 2018-03-14 DIAGNOSIS — M62830 Muscle spasm of back: Secondary | ICD-10-CM | POA: Insufficient documentation

## 2018-03-14 DIAGNOSIS — M79605 Pain in left leg: Secondary | ICD-10-CM | POA: Diagnosis not present

## 2018-03-14 DIAGNOSIS — M25561 Pain in right knee: Secondary | ICD-10-CM | POA: Diagnosis not present

## 2018-03-14 DIAGNOSIS — M160 Bilateral primary osteoarthritis of hip: Secondary | ICD-10-CM | POA: Insufficient documentation

## 2018-03-14 DIAGNOSIS — M533 Sacrococcygeal disorders, not elsewhere classified: Secondary | ICD-10-CM

## 2018-03-14 DIAGNOSIS — M79601 Pain in right arm: Secondary | ICD-10-CM | POA: Diagnosis not present

## 2018-03-14 DIAGNOSIS — M47816 Spondylosis without myelopathy or radiculopathy, lumbar region: Secondary | ICD-10-CM

## 2018-03-14 DIAGNOSIS — M7918 Myalgia, other site: Secondary | ICD-10-CM

## 2018-03-14 DIAGNOSIS — E7800 Pure hypercholesterolemia, unspecified: Secondary | ICD-10-CM | POA: Insufficient documentation

## 2018-03-14 DIAGNOSIS — Z888 Allergy status to other drugs, medicaments and biological substances status: Secondary | ICD-10-CM | POA: Insufficient documentation

## 2018-03-14 DIAGNOSIS — Z981 Arthrodesis status: Secondary | ICD-10-CM | POA: Insufficient documentation

## 2018-03-14 DIAGNOSIS — M47819 Spondylosis without myelopathy or radiculopathy, site unspecified: Secondary | ICD-10-CM | POA: Diagnosis not present

## 2018-03-14 MED ORDER — OXYCODONE HCL 5 MG PO TABS: 5.0000 mg | ORAL_TABLET | Freq: Two times a day (BID) | ORAL | 0 refills | Status: DC | PRN

## 2018-03-14 MED ORDER — LYRICA 100 MG PO CAPS: 100.0000 mg | ORAL_CAPSULE | Freq: Two times a day (BID) | ORAL | 2 refills | Status: DC

## 2018-03-14 MED ORDER — MAGNESIUM OXIDE -MG SUPPLEMENT 500 MG PO CAPS: 1.0000 | ORAL_CAPSULE | Freq: Two times a day (BID) | ORAL | 2 refills | Status: DC

## 2018-03-14 MED ORDER — CYCLOBENZAPRINE HCL 10 MG PO TABS: 10.0000 mg | ORAL_TABLET | Freq: Two times a day (BID) | ORAL | 2 refills | Status: DC | PRN

## 2018-03-14 NOTE — Progress Notes (Signed)
Nursing Pain Medication Assessment:  Safety precautions to be maintained throughout the outpatient stay will include: orient to surroundings, keep bed in low position, maintain call bell within reach at all times, provide assistance with transfer out of bed and ambulation.  Medication Inspection Compliance: Pill count conducted under aseptic conditions, in front of the patient. Neither the pills nor the bottle was removed from the patient's sight at any time. Once count was completed pills were immediately returned to the patient in their original bottle.  Medication: Oxycodone IR Pill/Patch Count: 17 of 60 pills remain Pill/Patch Appearance: Markings consistent with prescribed medication Bottle Appearance: Standard pharmacy container. Clearly labeled. Filled Date: 04 / 11 / 2019 Last Medication intake:  Yesterday

## 2018-03-14 NOTE — Patient Instructions (Signed)
____________________________________________________________________________________________  Pain Scale  Introduction: The pain score used by this practice is the Verbal Numerical Rating Scale (VNRS-11). This is an 11-point scale. It is for adults and children 10 years or older. There are significant differences in how the pain score is reported, used, and applied. Forget everything you learned in the past and learn this scoring system.  General Information: The scale should reflect your current level of pain. Unless you are specifically asked for the level of your worst pain, or your average pain. If you are asked for one of these two, then it should be understood that it is over the past 24 hours.  Basic Activities of Daily Living (ADL): Personal hygiene, dressing, eating, transferring, and using restroom.  Instructions: Most patients tend to report their level of pain as a combination of two factors, their physical pain and their psychosocial pain. This last one is also known as "suffering" and it is reflection of how physical pain affects you socially and psychologically. From now on, report them separately. From this point on, when asked to report your pain level, report only your physical pain. Use the following table for reference.  Pain Clinic Pain Levels (0-5/10)  Pain Level Score  Description  No Pain 0   Mild pain 1 Nagging, annoying, but does not interfere with basic activities of daily living (ADL). Patients are able to eat, bathe, get dressed, toileting (being able to get on and off the toilet and perform personal hygiene functions), transfer (move in and out of bed or a chair without assistance), and maintain continence (able to control bladder and bowel functions). Blood pressure and heart rate are unaffected. A normal heart rate for a healthy adult ranges from 60 to 100 bpm (beats per minute).   Mild to moderate pain 2 Noticeable and distracting. Impossible to hide from other  people. More frequent flare-ups. Still possible to adapt and function close to normal. It can be very annoying and may have occasional stronger flare-ups. With discipline, patients may get used to it and adapt.   Moderate pain 3 Interferes significantly with activities of daily living (ADL). It becomes difficult to feed, bathe, get dressed, get on and off the toilet or to perform personal hygiene functions. Difficult to get in and out of bed or a chair without assistance. Very distracting. With effort, it can be ignored when deeply involved in activities.   Moderately severe pain 4 Impossible to ignore for more than a few minutes. With effort, patients may still be able to manage work or participate in some social activities. Very difficult to concentrate. Signs of autonomic nervous system discharge are evident: dilated pupils (mydriasis); mild sweating (diaphoresis); sleep interference. Heart rate becomes elevated (>115 bpm). Diastolic blood pressure (lower number) rises above 100 mmHg. Patients find relief in laying down and not moving.   Severe pain 5 Intense and extremely unpleasant. Associated with frowning face and frequent crying. Pain overwhelms the senses.  Ability to do any activity or maintain social relationships becomes significantly limited. Conversation becomes difficult. Pacing back and forth is common, as getting into a comfortable position is nearly impossible. Pain wakes you up from deep sleep. Physical signs will be obvious: pupillary dilation; increased sweating; goosebumps; brisk reflexes; cold, clammy hands and feet; nausea, vomiting or dry heaves; loss of appetite; significant sleep disturbance with inability to fall asleep or to remain asleep. When persistent, significant weight loss is observed due to the complete loss of appetite and sleep deprivation.  Blood   pressure and heart rate becomes significantly elevated. Caution: If elevated blood pressure triggers a pounding headache  associated with blurred vision, then the patient should immediately seek attention at an urgent or emergency care unit, as these may be signs of an impending stroke.    Emergency Department Pain Levels (6-10/10)  Emergency Room Pain 6 Severely limiting. Requires emergency care and should not be seen or managed at an outpatient pain management facility. Communication becomes difficult and requires great effort. Assistance to reach the emergency department may be required. Facial flushing and profuse sweating along with potentially dangerous increases in heart rate and blood pressure will be evident.   Distressing pain 7 Self-care is very difficult. Assistance is required to transport, or use restroom. Assistance to reach the emergency department will be required. Tasks requiring coordination, such as bathing and getting dressed become very difficult.   Disabling pain 8 Self-care is no longer possible. At this level, pain is disabling. The individual is unable to do even the most "basic" activities such as walking, eating, bathing, dressing, transferring to a bed, or toileting. Fine motor skills are lost. It is difficult to think clearly.   Incapacitating pain 9 Pain becomes incapacitating. Thought processing is no longer possible. Difficult to remember your own name. Control of movement and coordination are lost.   The worst pain imaginable 10 At this level, most patients pass out from pain. When this level is reached, collapse of the autonomic nervous system occurs, leading to a sudden drop in blood pressure and heart rate. This in turn results in a temporary and dramatic drop in blood flow to the brain, leading to a loss of consciousness. Fainting is one of the body's self defense mechanisms. Passing out puts the brain in a calmed state and causes it to shut down for a while, in order to begin the healing process.    Summary: 1. Refer to this scale when providing us with your pain level. 2. Be  accurate and careful when reporting your pain level. This will help with your care. 3. Over-reporting your pain level will lead to loss of credibility. 4. Even a level of 1/10 means that there is pain and will be treated at our facility. 5. High, inaccurate reporting will be documented as "Symptom Exaggeration", leading to loss of credibility and suspicions of possible secondary gains such as obtaining more narcotics, or wanting to appear disabled, for fraudulent reasons. 6. Only pain levels of 5 or below will be seen at our facility. 7. Pain levels of 6 and above will be sent to the Emergency Department and the appointment cancelled. ____________________________________________________________________________________________   ____________________________________________________________________________________________  Preparing for Procedure with Sedation  Instructions: . Oral Intake: Do not eat or drink anything for at least 8 hours prior to your procedure. . Transportation: Public transportation is not allowed. Bring an adult driver. The driver must be physically present in our waiting room before any procedure can be started. . Physical Assistance: Bring an adult physically capable of assisting you, in the event you need help. This adult should keep you company at home for at least 6 hours after the procedure. . Blood Pressure Medicine: Take your blood pressure medicine with a sip of water the morning of the procedure. . Blood thinners:  . Diabetics on insulin: Notify the staff so that you can be scheduled 1st case in the morning. If your diabetes requires high dose insulin, take only  of your normal insulin dose the morning of the procedure and   notify the staff that you have done so. . Preventing infections: Shower with an antibacterial soap the morning of your procedure. . Build-up your immune system: Take 1000 mg of Vitamin C with every meal (3 times a day) the day prior to your  procedure. . Antibiotics: Inform the staff if you have a condition or reason that requires you to take antibiotics before dental procedures. . Pregnancy: If you are pregnant, call and cancel the procedure. . Sickness: If you have a cold, fever, or any active infections, call and cancel the procedure. . Arrival: You must be in the facility at least 30 minutes prior to your scheduled procedure. . Children: Do not bring children with you. . Dress appropriately: Bring dark clothing that you would not mind if they get stained. . Valuables: Do not bring any jewelry or valuables.  Procedure appointments are reserved for interventional treatments only. . No Prescription Refills. . No medication changes will be discussed during procedure appointments. . No disability issues will be discussed.  Remember:  Regular Business hours are:  Monday to Thursday 8:00 AM to 4:00 PM  Provider's Schedule: Paradise Vensel, MD:  Procedure days: Tuesday and Thursday 7:30 AM to 4:00 PM  Bilal Lateef, MD:  Procedure days: Monday and Wednesday 7:30 AM to 4:00 PM ____________________________________________________________________________________________    

## 2018-03-14 NOTE — Progress Notes (Signed)
Patient's Name: Jennifer Harmon  MRN: [id number]  Referring Provider: Glenda Chroman, MD  DOB: 1961-05-09  PCP: Glenda Chroman, MD  DOS: 03/14/2018  Note by: Gaspar Cola, MD  Service setting: Ambulatory outpatient  Specialty: Interventional Pain Management  Location: [facility] (AMB) Pain Management Facility    Patient type: Established   Primary Reason(s) for Visit: Encounter for prescription drug management & post-procedure evaluation of chronic illness with mild to moderate exacerbation(Level of risk: moderate) CC: Back Pain (lower)  HPI  Jennifer Harmon is a 57 y.o. year old, female patient, who comes today for a post-procedure evaluation and medication management. She has Dyslipidemia; History of gastroesophageal reflux (GERD); History of sleep apnea; History of depression; History of anxiety; Fibromyalgia; DDD (degenerative disc disease), lumbar; S/P partial knee replacement (Left); S/P total knee replacement (Right); Osteoarthritis of feet (Bilateral); History of diverticulosis; Former smoker; Chronic diarrhea; Chronic inflammatory arthritis; History of knee arthroplasty (Bilateral); Osteoarthritis of knee (Right); Symptomatic mammary hypertrophy; Chronic upper back pain (Primary Area of Pain) (Bilateral) (R>L); Chronic neck pain (Tertiary Area of Pain) (Bilateral) (R>L); Chronic shoulder pain; Chronic upper extremity pain (Bilateral) (R>L); Chronic pain syndrome; Long term current use of opiate analgesic; Disorder of skeletal system; Chronic sacroiliac joint pain (Bilateral) (R>L); Chronic low back pain  (Secondary Area of Pain) (Bilateral) (R>L); Chronic knee pain (Fourth Area of Pain) (Bilateral) (R>L); Long term prescription opiate use; NSAID long-term use; Opiate use; Pharmacologic therapy; Problems influencing health status; Neurogenic pain; Chronic musculoskeletal pain; Chronic recurrent lower extremity muscle spasms (Bilateral); Long term prescription benzodiazepine use; Thoracic radiculopathy due  to osteoarthritis of spine; Thoracic radiculitis (T6/T7) (Right); Chronic lower extremity pain (Bilateral) (R>L); Chronic lower extremity radicular pain (L5) (Right); DISH (diffuse idiopathic skeletal hyperostosis); 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 Her primarily concern today is the Back Pain (lower)  Pain Assessment: Location: Lower Back Radiating: Radiates down to buttock and both hip Onset: More than a month ago Duration: Chronic pain Quality: Discomfort, Aching Severity: 7 /10 (subjective, self-reported pain score)  Note: Reported level is inconsistent with clinical observations. Clinically the patient looks like a 3/10 A 3/10 is viewed as "Moderate" and described as significantly interfering with activities of daily living (ADL). It becomes difficult to feed, bathe, get dressed, get on and off the toilet or to perform personal hygiene functions. Difficult to get in and out of bed or a chair without assistance. Very distracting. With effort, it can be ignored when deeply involved in activities. Information on the proper use of the pain scale provided to the patient today. When using our objective Pain Scale, levels between 6 and 10/10 are said to belong in an emergency room, as it progressively worsens from a 6/10, described as severely limiting, requiring emergency care not usually available at an outpatient pain management facility. At a 6/10 level, communication becomes difficult and requires great effort. Assistance to reach the  emergency department may be required. Facial flushing and profuse sweating along with potentially dangerous increases in heart rate and blood pressure will be evident. Effect on ADL: Hard to get out of the bed Timing: Intermittent Modifying factors: meds, laying down, ice and heat  BP: 139/75  HR: 100  [name] was last seen on 02/15/2018 for a procedure. During today's appointment we reviewed Ms. Sulak's post-procedure results, as well as her outpatient medication regimen.  Further details on both, my assessment(s), as well as the proposed treatment plan, please see below.  Controlled Substance Pharmacotherapy Assessment REMS (Risk Evaluation and Mitigation Strategy)  Analgesic: Oxycodone IR 5 mg twice daily MME/day: 15 mg/day.  Chauncey Fischer, RN  03/14/2018  2:21 PM  Sign at close encounter Nursing Pain Medication Assessment:  Safety precautions to be maintained throughout the outpatient stay will include: orient to surroundings, keep bed in low position, maintain call bell within reach at all times, provide assistance with transfer out of bed and ambulation.  Medication Inspection Compliance: Pill count conducted under aseptic conditions, in front of the patient. Neither the pills nor the bottle was removed from the patient's sight at any time. Once count was completed pills were immediately returned to the patient in their original bottle.  Medication: Oxycodone IR Pill/Patch Count: 17 of 60 pills remain Pill/Patch Appearance: Markings consistent with prescribed medication Bottle Appearance: Standard pharmacy container. Clearly labeled. Filled Date: 04 / 11 / 2019 Last Medication intake:  Yesterday   Pharmacokinetics: Liberation and absorption (onset of action): WNL Distribution (time to peak effect): WNL Metabolism and excretion (duration of action): WNL         Pharmacodynamics: Desired effects: Analgesia: Jennifer Harmon reports >50% benefit. Functional ability: Patient  reports that medication allows her to accomplish basic ADLs Clinically meaningful improvement in function (CMIF): Sustained CMIF goals met Perceived effectiveness: Described as relatively effective, allowing for increase in activities of daily living (ADL) Undesirable effects: Side-effects or Adverse reactions: None reported Monitoring: [place] PMP: Online review of the past 19-monthperiod conducted. Compliant with practice rules and regulations Last UDS on record: Summary  Date Value Ref Range Status  09/06/2017 FINAL  Final    Comment:    ==================================================================== [facility] COMP DRUG ANALYSIS,UR ==================================================================== Test                             Result       Flag       Units Drug Present and Declared for Prescription Verification   7-aminoclonazepam              238          EXPECTED   ng/mg creat    7-aminoclonazepam is an expected metabolite of clonazepam. Source    of clonazepam is a scheduled prescription medication.   Oxycodone                      2298         EXPECTED   ng/mg creat   Oxymorphone                    376          EXPECTED   ng/mg creat   Noroxycodone                   3913         EXPECTED   ng/mg creat   Noroxymorphone                 77           EXPECTED   ng/mg creat    Sources of oxycodone are scheduled prescription medications.  Oxymorphone, noroxycodone, and noroxymorphone are expected    metabolites of oxycodone. Oxymorphone is also available as a    scheduled prescription medication.   Pregabalin                     PRESENT      EXPECTED   Cyclobenzaprine                PRESENT      EXPECTED   Desmethylcyclobenzaprine       PRESENT      EXPECTED    Desmethylcyclobenzaprine is an expected metabolite of    cyclobenzaprine.   Bupropion                      PRESENT      EXPECTED   Hydroxybupropion               PRESENT      EXPECTED    Hydroxybupropion is an  expected metabolite of bupropion.   Acetaminophen                  PRESENT      EXPECTED Drug Present not Declared for Prescription Verification   Diphenhydramine                PRESENT      UNEXPECTED Drug Absent but Declared for Prescription Verification   Hydrocodone                    Not Detected UNEXPECTED ng/mg creat   Citalopram                     Not Detected UNEXPECTED   Diclofenac                     Not Detected UNEXPECTED    Diclofenac, as indicated in the declared medication list, is not    always detected even when used as directed.   Salicylate                     Not Detected UNEXPECTED    Aspirin, as indicated in the declared medication list, is not    always detected even when used as directed. ==================================================================== Test                      Result    Flag   Units      Ref Range   Creatinine              127              mg/dL      >=20 ==================================================================== Declared Medications:  The flagging and interpretation on this report are based on the  following declared medications.  Unexpected results may arise from  inaccuracies in the declared medications.  **Note: The testing scope of this panel includes these medications:  Bupropion (Wellbutrin)  Citalopram (Celexa)  Clonazepam (Klonopin)  Cyclobenzaprine (Flexeril)  Hydrocodone (Norco)  Oxycodone  Pregabalin (Lyrica)  **Note: The testing scope of this panel does not include small to  moderate amounts of these reported medications:  Acetaminophen (Norco)  Aspirin (Aspirin 81)  Diclofenac (Voltaren)  **Note: The testing scope of this panel does not include following  reported medications:  Famotidine (Pepcid)  Latanoprost (Xalatan)  Pantoprazole  Ranitidine (Zantac)  Simvastatin (Zocor) ==================================================================== For clinical consultation, please call [phone]. ====================================================================  UDS interpretation: Compliant          Medication Assessment Form: Reviewed. Patient indicates being compliant with therapy Treatment compliance: Compliant Risk Assessment Profile: Aberrant behavior: See prior evaluations. None observed or detected today Comorbid factors increasing risk of overdose: See prior notes. No additional risks detected today Risk of substance use disorder (SUD): Low Opioid Risk Tool - 02/15/18 1425      Family History of Substance Abuse   Alcohol  Negative      Total Score   Opioid Risk Tool Scoring  0    Opioid Risk Interpretation  Low Risk      ORT Scoring interpretation table:  Score <3 = Low Risk for SUD  Score between 4-7 = Moderate Risk for SUD  Score >8 = High Risk for Opioid Abuse   Risk Mitigation Strategies:  Patient Counseling: Covered Patient-Prescriber Agreement (PPA): Present and active  Notification to other healthcare providers: Done  Pharmacologic Plan: No change in therapy, at this time.             Post-Procedure Assessment  02/15/2018 Procedure: Diagnostic bilateral lumbar facet nerve block #2 + diagnostic right-sided sacroiliac joint block #2  Pre-procedure pain score:  7/10 Post-procedure pain score: 0/10 (100% relief) Influential Factors: BMI: 36.14 kg/m Intra-procedural challenges: None observed.         Assessment challenges: None detected.              Reported side-effects: None.        Post-procedural adverse reactions or complications: None reported         Sedation: Sedation provided. When no sedatives are used, the analgesic levels obtained are directly associated to the effectiveness of the local anesthetics. However, when sedation is provided, the level of analgesia obtained during the initial 1 hour following the intervention, is believed to be the result of a combination of factors. These factors may include, but are not limited  to: 1. The effectiveness of the local anesthetics used. 2. The effects of the analgesic(s) and/or anxiolytic(s) used. 3. The degree of discomfort experienced by the patient at the time of the procedure. 4. The patients ability and reliability in recalling and recording the events. 5. The presence and influence of possible secondary gains and/or psychosocial factors. Reported result: Relief experienced during the 1st hour after the procedure: 100 % (Ultra-Short Term Relief)            Interpretative annotation: Clinically appropriate result. Analgesia during this period is likely to be Local Anesthetic and/or IV Sedative (Analgesic/Anxiolytic) related.          Effects of local anesthetic: The analgesic effects attained during this period are directly associated to the localized infiltration of local anesthetics and therefore cary significant diagnostic value as to the etiological location, or anatomical origin, of the pain. Expected duration of relief is directly dependent on the pharmacodynamics of the local anesthetic used. Long-acting (4-6 hours) anesthetics used.  Reported result: Relief during the next 4 to 6 hour after the procedure: 90 % (Short-Term Relief)            Interpretative annotation: Clinically appropriate result. Analgesia during this period is likely to be Local Anesthetic-related.          Long-term benefit: Defined as the period of time past the expected duration of local anesthetics (1 hour for short-acting and 4-6 hours for long-acting). With the possible exception of prolonged sympathetic blockade from the local anesthetics, benefits during this period are typically attributed  to, or associated with, other factors such as analgesic sensory neuropraxia, antiinflammatory effects, or beneficial biochemical changes provided by agents other than the local anesthetics.  Reported result: Extended relief following procedure: 60 %(pt fell and her pain has increased in her back)  (Long-Term Relief)            Interpretative annotation: Clinically appropriate result. Good relief. No permanent benefit expected. Inflammation plays a part in the etiology to the pain.          Current benefits: Defined as reported results that persistent at this point in time.   Analgesia: 0-25 %            Function: Back to baseline ROM: Back to baseline Interpretative annotation: Recurrence of symptoms. No permanent benefit expected. Effective diagnostic intervention.          Interpretation: Results would suggest a successful diagnostic intervention.                  Plan:  Please see "Plan of Care" for details.                Laboratory Chemistry  Inflammation Markers (CRP: Acute Phase) (ESR: Chronic Phase) Lab Results  Component Value Date   CRP <0.8 09/06/2017   ESRSEDRATE 18 09/06/2017                         Rheumatology Markers Lab Results  Component Value Date   RF <14 07/03/2017                        Renal Function Markers Lab Results  Component Value Date   BUN 16 09/06/2017   CREATININE 0.67 09/06/2017   GFRAA >60 09/06/2017   GFRNONAA >60 09/06/2017                              Hepatic Function Markers Lab Results  Component Value Date   AST 26 09/06/2017   ALT 31 09/06/2017   ALBUMIN 4.8 09/06/2017   ALKPHOS 100 09/06/2017                        Electrolytes Lab Results  Component Value Date   NA 140 09/06/2017   K 4.0 09/06/2017   CL 104 09/06/2017   CALCIUM 10.0 09/06/2017   MG 2.0 07/03/2017                        Neuropathy Markers Lab Results  Component Value Date   VITAMINB12 475 09/06/2017                        Coagulation Parameters Lab Results  Component Value Date   PLT 197 07/03/2017                        Cardiovascular Markers Lab Results  Component Value Date   CKTOTAL 130 07/03/2017   HGB 14.0 07/03/2017   HCT 43.3 07/03/2017                         Note: Lab results reviewed.  Recent Diagnostic Imaging  Results  DG C-Arm 1-60 Min-No Report Fluoroscopy was utilized by the requesting physician.  No radiographic  interpretation.   Complexity Note: I personally reviewed  the fluoroscopic imaging of the procedure.                        Meds   Current Outpatient Medications:  .  citalopram (CELEXA) 20 MG tablet, Take 20 mg by mouth daily.  , Disp: , Rfl:  .  clonazePAM (KLONOPIN) 1 MG tablet, Take 1 mg by mouth daily. , Disp: , Rfl:  .  latanoprost (XALATAN) 0.005 % ophthalmic solution, Place 1 drop into both eyes daily., Disp: , Rfl:  .  ranitidine (ZANTAC) 150 MG tablet, Take 150 mg by mouth daily., Disp: , Rfl:  .  [START ON 04/03/2018] cyclobenzaprine (FLEXERIL) 10 MG tablet, Take 1 tablet (10 mg total) by mouth 2 (two) times daily as needed for muscle spasms., Disp: 60 tablet, Rfl: 2 .  [START ON 04/03/2018] LYRICA 100 MG capsule, Take 1 capsule (100 mg total) by mouth 2 (two) times daily., Disp: 60 capsule, Rfl: 2 .  [START ON 04/03/2018] Magnesium Oxide 500 MG CAPS, Take 1 capsule (500 mg total) by mouth 2 (two) times daily at 8 am and 10 pm., Disp: 60 capsule, Rfl: 2 .  [START ON 06/02/2018] oxyCODONE (OXY IR/ROXICODONE) 5 MG immediate release tablet, Take 1 tablet (5 mg total) by mouth 2 (two) times daily as needed for severe pain., Disp: 60 tablet, Rfl: 0 .  [START ON 05/03/2018] oxyCODONE (OXY IR/ROXICODONE) 5 MG immediate release tablet, Take 1 tablet (5 mg total) by mouth 2 (two) times daily as needed for severe pain., Disp: 60 tablet, Rfl: 0 .  [START ON 04/03/2018] oxyCODONE (OXY IR/ROXICODONE) 5 MG immediate release tablet, Take 1 tablet (5 mg total) by mouth 2 (two) times daily as needed for severe pain., Disp: 60 tablet, Rfl: 0  ROS  Constitutional: Denies any fever or chills Gastrointestinal: No reported hemesis, hematochezia, vomiting, or acute GI distress Musculoskeletal: Denies any acute onset joint swelling, redness, loss of ROM, or weakness Neurological: No reported episodes of  acute onset apraxia, aphasia, dysarthria, agnosia, amnesia, paralysis, loss of coordination, or loss of consciousness  Allergies  Ms. Celmer is allergic to asa [aspirin]; bupropion; and tape.  [place]  Drug: Ms. Eagles  reports that she does not use drugs. Alcohol:  reports that she does not drink alcohol. Tobacco:  reports that she quit smoking about 2 years ago. Her smoking use included pipe. She has a 10.00 pack-year smoking history. She uses smokeless tobacco. Medical:  has a past medical history of Anxiety, Chronic chest pain, Depression, DJD (degenerative joint disease), Fibromyalgia, GERD (gastroesophageal reflux disease), and Hypercholesterolemia. Surgical: Ms. Nahm  has a past surgical history that includes Cholecystectomy; Cesarean section; and Back surgery (2008). Family: family history includes Arthritis in her father; Hypertension in her mother.  Constitutional Exam  General appearance: Well nourished, well developed, and well hydrated. In no apparent acute distress Vitals:   03/14/18 1334  BP: 139/75  Pulse: 100  Temp: 98.3 F (36.8 C)  SpO2: 99%  Weight: 204 lb (92.5 kg)  Height: 5' 3" (1.6 m)   BMI Assessment: Estimated body mass index is 36.14 kg/m as calculated from the following:   Height as of this encounter: 5' 3" (1.6 m).   Weight as of this encounter: 204 lb (92.5 kg).  BMI interpretation table: BMI level Category Range association with higher incidence of chronic pain  <18 kg/m2 Underweight   18.5-24.9 kg/m2 Ideal body weight   25-29.9 kg/m2 Overweight Increased incidence by 20%  30-34.9  kg/m2 Obese (Class I) Increased incidence by 68%  35-39.9 kg/m2 Severe obesity (Class II) Increased incidence by 136%  >40 kg/m2 Extreme obesity (Class III) Increased incidence by 254%   Patient's current BMI Ideal Body weight  Body mass index is 36.14 kg/m. Ideal body weight: 52.4 kg (115 lb 8.3 oz) Adjusted ideal body weight: 68.5 kg (150 lb 14.6 oz)   BMI  Readings from Last 4 Encounters:  03/14/18 36.14 kg/m  02/15/18 36.31 kg/m  01/31/18 36.14 kg/m  01/18/18 36.31 kg/m   Wt Readings from Last 4 Encounters:  03/14/18 204 lb (92.5 kg)  02/15/18 205 lb (93 kg)  01/31/18 204 lb (92.5 kg)  01/18/18 205 lb (93 kg)  Psych/Mental status: Alert, oriented x 3 (person, place, & time)       Eyes: PERLA Respiratory: No evidence of acute respiratory distress  Cervical Spine Area Exam  Skin & Axial Inspection: No masses, redness, edema, swelling, or associated skin lesions Alignment: Symmetrical Functional ROM: Unrestricted ROM      Stability: No instability detected Muscle Tone/Strength: Functionally intact. No obvious neuro-muscular anomalies detected. Sensory (Neurological): Unimpaired Palpation: No palpable anomalies              Upper Extremity (UE) Exam    Side: Right upper extremity  Side: Left upper extremity  Skin & Extremity Inspection: Skin color, temperature, and hair growth are WNL. No peripheral edema or cyanosis. No masses, redness, swelling, asymmetry, or associated skin lesions. No contractures.  Skin & Extremity Inspection: Skin color, temperature, and hair growth are WNL. No peripheral edema or cyanosis. No masses, redness, swelling, asymmetry, or associated skin lesions. No contractures.  Functional ROM: Unrestricted ROM          Functional ROM: Unrestricted ROM          Muscle Tone/Strength: Functionally intact. No obvious neuro-muscular anomalies detected.  Muscle Tone/Strength: Functionally intact. No obvious neuro-muscular anomalies detected.  Sensory (Neurological): Unimpaired          Sensory (Neurological): Unimpaired          Palpation: No palpable anomalies              Palpation: No palpable anomalies              Specialized Test(s): Deferred         Specialized Test(s): Deferred          Thoracic Spine Area Exam  Skin & Axial Inspection: No masses, redness, or swelling Alignment: Symmetrical Functional ROM:  Unrestricted ROM Stability: No instability detected Muscle Tone/Strength: Functionally intact. No obvious neuro-muscular anomalies detected. Sensory (Neurological): Unimpaired Muscle strength & Tone: No palpable anomalies  Lumbar Spine Area Exam  Skin & Axial Inspection: No masses, redness, or swelling Alignment: Symmetrical Functional ROM: Decreased ROM       Stability: No instability detected Muscle Tone/Strength: Functionally intact. No obvious neuro-muscular anomalies detected. Sensory (Neurological): Movement-associated pain Palpation: Complains of area being tender to palpation       Provocative Tests: Lumbar Hyperextension and rotation test: Positive bilaterally for facet joint pain. Lumbar Lateral bending test: evaluation deferred today       Patrick's Maneuver: Positive for bilateral S-I arthralgia              Gait & Posture Assessment  Ambulation: Limited Gait: Antalgic Posture: Difficulty standing up straight, due to pain   Lower Extremity Exam    Side: Right lower extremity  Side: Left lower extremity  Stability: No instability observed  Stability: No instability observed          Skin & Extremity Inspection: Skin color, temperature, and hair growth are WNL. No peripheral edema or cyanosis. No masses, redness, swelling, asymmetry, or associated skin lesions. No contractures.  Skin & Extremity Inspection: Skin color, temperature, and hair growth are WNL. No peripheral edema or cyanosis. No masses, redness, swelling, asymmetry, or associated skin lesions. No contractures.  Functional ROM: Unrestricted ROM                  Functional ROM: Unrestricted ROM                  Muscle Tone/Strength: Functionally intact. No obvious neuro-muscular anomalies detected.  Muscle Tone/Strength: Functionally intact. No obvious neuro-muscular anomalies detected.  Sensory (Neurological): Unimpaired  Sensory (Neurological): Unimpaired  Palpation: No palpable anomalies  Palpation: No  palpable anomalies   Assessment  Primary Diagnosis & Pertinent Problem List: The primary encounter diagnosis was Spondylosis without myelopathy or radiculopathy, lumbosacral region. Diagnoses of Lumbar facet syndrome (Bilateral) (R>L), Other specified dorsopathies, sacral and sacrococcygeal region, Chronic sacroiliac joint pain (Bilateral) (R>L), Chronic low back pain  (Secondary Area of Pain) (Bilateral) (R>L), Chronic musculoskeletal pain, Muscle spasms of both lower extremities, Chronic pain syndrome, Neurogenic pain, and Fibromyalgia were also pertinent to this visit.  Status Diagnosis  Persistent Persistent Persistent 1. Spondylosis without myelopathy or radiculopathy, lumbosacral region   2. Lumbar facet syndrome (Bilateral) (R>L)   3. Other specified dorsopathies, sacral and sacrococcygeal region   4. Chronic sacroiliac joint pain (Bilateral) (R>L)   5. Chronic low back pain  (Secondary Area of Pain) (Bilateral) (R>L)   6. Chronic musculoskeletal pain   7. Muscle spasms of both lower extremities   8. Chronic pain syndrome   9. Neurogenic pain   10. Fibromyalgia     Problems updated and reviewed during this visit: No problems updated. Plan of Care  Pharmacotherapy (Medications Ordered): Meds ordered this encounter  Medications  . cyclobenzaprine (FLEXERIL) 10 MG tablet    Sig: Take 1 tablet (10 mg total) by mouth 2 (two) times daily as needed for muscle spasms.    Dispense:  60 tablet    Refill:  2    Do not place medication on "Automatic Refill". Fill one day early if pharmacy is closed on scheduled refill date.  [name] oxyCODONE (OXY IR/ROXICODONE) 5 MG immediate release tablet    Sig: Take 1 tablet (5 mg total) by mouth 2 (two) times daily as needed for severe pain.    Dispense:  60 tablet    Refill:  0    Do not place this medication, or any other prescription from our practice, on "Automatic Refill". Patient may have prescription filled one day early if pharmacy is closed on  scheduled refill date. Do not fill until: 06/02/18 To last until: 07/02/18  . oxyCODONE (OXY IR/ROXICODONE) 5 MG immediate release tablet    Sig: Take 1 tablet (5 mg total) by mouth 2 (two) times daily as needed for severe pain.    Dispense:  60 tablet    Refill:  0    Do not place this medication, or any other prescription from our practice, on "Automatic Refill". Patient may have prescription filled one day early if pharmacy is closed on scheduled refill date. Do not fill until: 05/03/18 To last until: 06/02/18  . oxyCODONE (OXY IR/ROXICODONE) 5 MG immediate release tablet    Sig: Take 1 tablet (5 mg total) by mouth 2 (  two) times daily as needed for severe pain.    Dispense:  60 tablet    Refill:  0    Do not place this medication, or any other prescription from our practice, on "Automatic Refill". Patient may have prescription filled one day early if pharmacy is closed on scheduled refill date. Do not fill until: 04/03/18 To last until: 05/03/18  . LYRICA 100 MG capsule    Sig: Take 1 capsule (100 mg total) by mouth 2 (two) times daily.    Dispense:  60 capsule    Refill:  2    Do not place medication on "Automatic Refill". Fill one day early if pharmacy is closed on scheduled refill date.  . Magnesium Oxide 500 MG CAPS    Sig: Take 1 capsule (500 mg total) by mouth 2 (two) times daily at 8 am and 10 pm.    Dispense:  60 capsule    Refill:  2    Do not place medication on "Automatic Refill". Fill one day early if pharmacy is closed on scheduled refill date.   Medications administered today: Shyrl Numbers. Landowski had no medications administered during this visit.   Procedure Orders     Radiofrequency,Lumbar     Radiofrequency Sacroiliac Joint Lab Orders  No laboratory test(s) ordered today   Imaging Orders  No imaging studies ordered today   Referral Orders  No referral(s) requested today    Interventional management options: Planned, scheduled, and/or pending:    Therapeuticbilateral lumbar facet RFA, starting with the right side.   Considering:   Diagnostic Trigger point injections Diagnostic bilateral LESI Diagnostic bilateral lumbar facet nerve block #3  Possiblebilateral lumbar facet RFA Possiblebilateral Racz(Epidural Neurolysis) Diagnostic thoracic facet nerve block Possible thoracic facet RFA Diagnostic bilateral CESI Diagnosticbilateral cervical facet nerve block Possiblebilateral cervical facet RFA Diagnostic right shoulder intra-articular injection Diagnostic right suprascapular nerve block Possibleright suprascapular RFA Diagnostic bilateral genicular nerve block Possible bilateral genicular RFA   Palliative PRN treatment(s):   None at this time   Provider-requested follow-up: Return for RFA (fluoro + sedation): (R) L-FCT + SI RFA.  Future Appointments  Date Time Provider [place]  03/22/2018  8:45 AM Vevelyn Francois, NP [facility] None  05/03/2018 10:30 AM Milinda Pointer, MD [facility] None  08/24/2018  9:15 AM Bo Merino, MD [facility] None   Primary Care Physician: Glenda Chroman, MD Location: [facility] Note by: Gaspar Cola, MD Date: 03/14/2018; Time: 2:04 PM

## 2018-03-22 ENCOUNTER — Encounter: Payer: Self-pay | Admitting: Nurse Practitioner

## 2018-04-13 DIAGNOSIS — G4733 Obstructive sleep apnea (adult) (pediatric): Secondary | ICD-10-CM | POA: Diagnosis not present

## 2018-04-27 DIAGNOSIS — Z6835 Body mass index (BMI) 35.0-35.9, adult: Secondary | ICD-10-CM | POA: Diagnosis not present

## 2018-04-27 DIAGNOSIS — F419 Anxiety disorder, unspecified: Secondary | ICD-10-CM | POA: Diagnosis not present

## 2018-04-27 DIAGNOSIS — G8929 Other chronic pain: Secondary | ICD-10-CM | POA: Diagnosis not present

## 2018-04-27 DIAGNOSIS — Z299 Encounter for prophylactic measures, unspecified: Secondary | ICD-10-CM | POA: Diagnosis not present

## 2018-04-27 DIAGNOSIS — I10 Essential (primary) hypertension: Secondary | ICD-10-CM | POA: Diagnosis not present

## 2018-04-27 DIAGNOSIS — G4730 Sleep apnea, unspecified: Secondary | ICD-10-CM | POA: Diagnosis not present

## 2018-05-03 ENCOUNTER — Other Ambulatory Visit: Payer: Self-pay

## 2018-05-03 ENCOUNTER — Encounter: Payer: Self-pay | Admitting: Pain Medicine

## 2018-05-03 ENCOUNTER — Ambulatory Visit (HOSPITAL_BASED_OUTPATIENT_CLINIC_OR_DEPARTMENT_OTHER): Payer: PPO | Admitting: Pain Medicine

## 2018-05-03 ENCOUNTER — Ambulatory Visit
Admission: RE | Admit: 2018-05-03 | Discharge: 2018-05-03 | Disposition: A | Discharge status: Home / Self Care | Payer: PPO | Source: Ambulatory Visit | Attending: Pain Medicine | Admitting: Pain Medicine

## 2018-05-03 VITALS — BP 109/66 | HR 93 | Temp 97.3°F | Resp 21 | Ht 63.0 in | Wt 206.0 lb

## 2018-05-03 DIAGNOSIS — M5441 Lumbago with sciatica, right side: Secondary | ICD-10-CM | POA: Insufficient documentation

## 2018-05-03 DIAGNOSIS — M5388 Other specified dorsopathies, sacral and sacrococcygeal region: Secondary | ICD-10-CM

## 2018-05-03 DIAGNOSIS — M25551 Pain in right hip: Secondary | ICD-10-CM | POA: Diagnosis not present

## 2018-05-03 DIAGNOSIS — G8918 Other acute postprocedural pain: Secondary | ICD-10-CM

## 2018-05-03 DIAGNOSIS — M47816 Spondylosis without myelopathy or radiculopathy, lumbar region: Secondary | ICD-10-CM

## 2018-05-03 DIAGNOSIS — M545 Low back pain: Secondary | ICD-10-CM | POA: Diagnosis present

## 2018-05-03 DIAGNOSIS — M5442 Lumbago with sciatica, left side: Secondary | ICD-10-CM | POA: Insufficient documentation

## 2018-05-03 DIAGNOSIS — M533 Sacrococcygeal disorders, not elsewhere classified: Secondary | ICD-10-CM

## 2018-05-03 DIAGNOSIS — G8929 Other chronic pain: Secondary | ICD-10-CM | POA: Insufficient documentation

## 2018-05-03 DIAGNOSIS — M4810 Ankylosing hyperostosis [Forestier], site unspecified: Secondary | ICD-10-CM | POA: Insufficient documentation

## 2018-05-03 DIAGNOSIS — M47817 Spondylosis without myelopathy or radiculopathy, lumbosacral region: Secondary | ICD-10-CM | POA: Diagnosis not present

## 2018-05-03 DIAGNOSIS — Z886 Allergy status to analgesic agent status: Secondary | ICD-10-CM | POA: Diagnosis not present

## 2018-05-03 DIAGNOSIS — Z9049 Acquired absence of other specified parts of digestive tract: Secondary | ICD-10-CM | POA: Insufficient documentation

## 2018-05-03 DIAGNOSIS — M25552 Pain in left hip: Secondary | ICD-10-CM | POA: Insufficient documentation

## 2018-05-03 DIAGNOSIS — Z9889 Other specified postprocedural states: Secondary | ICD-10-CM | POA: Insufficient documentation

## 2018-05-03 HISTORY — DX: Other acute postprocedural pain: G89.18

## 2018-05-03 MED ORDER — LIDOCAINE HCL 2 % IJ SOLN
20.0000 mL | Freq: Once | INTRAMUSCULAR | Status: AC
  Administered 2018-05-03: 400 mg
  Filled 2018-05-03: qty 40

## 2018-05-03 MED ORDER — ROPIVACAINE HCL 2 MG/ML IJ SOLN
18.0000 mL | Freq: Once | INTRAMUSCULAR | Status: AC
  Administered 2018-05-03: 18 mL via PERINEURAL
  Filled 2018-05-03: qty 20

## 2018-05-03 MED ORDER — MIDAZOLAM HCL 5 MG/5ML IJ SOLN
1.0000 mg | INTRAMUSCULAR | Status: DC | PRN
  Administered 2018-05-03: 4 mg via INTRAVENOUS
  Filled 2018-05-03: qty 5

## 2018-05-03 MED ORDER — LACTATED RINGERS IV SOLN
1000.0000 mL | Freq: Once | INTRAVENOUS | Status: AC
  Administered 2018-05-03: 1000 mL via INTRAVENOUS

## 2018-05-03 MED ORDER — HYDROCODONE-ACETAMINOPHEN 5-325 MG PO TABS
1.0000 | ORAL_TABLET | Freq: Three times a day (TID) | ORAL | 0 refills | Status: AC | PRN
Start: 2018-05-10 — End: 2018-05-17

## 2018-05-03 MED ORDER — TRIAMCINOLONE ACETONIDE 40 MG/ML IJ SUSP
80.0000 mg | Freq: Once | INTRAMUSCULAR | Status: AC
  Administered 2018-05-03: 80 mg
  Filled 2018-05-03: qty 2

## 2018-05-03 MED ORDER — HYDROCODONE-ACETAMINOPHEN 5-325 MG PO TABS: 1.0000 | ORAL_TABLET | Freq: Three times a day (TID) | ORAL | 0 refills | Status: AC | PRN

## 2018-05-03 MED ORDER — FENTANYL CITRATE (PF) 100 MCG/2ML IJ SOLN
25.0000 ug | INTRAMUSCULAR | Status: DC | PRN
  Administered 2018-05-03: 100 ug via INTRAVENOUS
  Filled 2018-05-03: qty 2

## 2018-05-03 NOTE — Progress Notes (Signed)
Safety precautions to be maintained throughout the outpatient stay will include: orient to surroundings, keep bed in low position, maintain call bell within reach at all times, provide assistance with transfer out of bed and ambulation.  

## 2018-05-03 NOTE — Patient Instructions (Addendum)
___You have been given a script for hydrocodone today for post rf pain_________________________________________________________________________________________  Post-Procedure Discharge Instructions  Instructions:  Apply ice: Fill a plastic sandwich bag with crushed ice. Cover it with a small towel and apply to injection site. Apply for 15 minutes then remove x 15 minutes. Repeat sequence on day of procedure, until you go to bed. The purpose is to minimize swelling and discomfort after procedure.  Apply heat: Apply heat to procedure site starting the day following the procedure. The purpose is to treat any soreness and discomfort from the procedure.  Food intake: Start with clear liquids (like water) and advance to regular food, as tolerated.   Physical activities: Keep activities to a minimum for the first 8 hours after the procedure.   Driving: If you have received any sedation, you are not allowed to drive for 24 hours after your procedure.  Blood thinner: Restart your blood thinner 6 hours after your procedure. (Only for those taking blood thinners)  Insulin: As soon as you can eat, you may resume your normal dosing schedule. (Only for those taking insulin)  Infection prevention: Keep procedure site clean and dry.  Post-procedure Pain Diary: Extremely important that this be done correctly and accurately. Recorded information will be used to determine the next step in treatment.  Pain evaluated is that of treated area only. Do not include pain from an untreated area.  Complete every hour, on the hour, for the initial 8 hours. Set an alarm to help you do this part accurately.  Do not go to sleep and have it completed later. It will not be accurate.  Follow-up appointment: Keep your follow-up appointment after the procedure. Usually 2 weeks for most procedures. (6 weeks in the case of radiofrequency.) Bring you pain diary.   Expect:  From numbing medicine (AKA: Local Anesthetics):  Numbness or decrease in pain.  Onset: Full effect within 15 minutes of injected.  Duration: It will depend on the type of local anesthetic used. On the average, 1 to 8 hours.   From steroids: Decrease in swelling or inflammation. Once inflammation is improved, relief of the pain will follow.  Onset of benefits: Depends on the amount of swelling present. The more swelling, the longer it will take for the benefits to be seen. In some cases, up to 10 days.  Duration: Steroids will stay in the system x 2 weeks. Duration of benefits will depend on multiple posibilities including persistent irritating factors.  From procedure: Some discomfort is to be expected once the numbing medicine wears off. This should be minimal if ice and heat are applied as instructed.  Call if:  You experience numbness and weakness that gets worse with time, as opposed to wearing off.  New onset bowel or bladder incontinence. (This applies to Spinal procedures only)  Emergency Numbers:  Durning business hours (Monday - Thursday, 8:00 AM - 4:00 PM) (Friday, 9:00 AM - 12:00 Noon): [phone]  After hours: [phone] ____________________________________________________________________________________________   ____________________________________________________________________________________________  Pain Scale  Introduction: The pain score used by this practice is the Verbal Numerical Rating Scale (VNRS-11). This is an 11-point scale. It is for adults and children 10 years or older. There are significant differences in how the pain score is reported, used, and applied. Forget everything you learned in the past and learn this scoring system.  General Information: The scale should reflect your current level of pain. Unless you are specifically asked for the level of your worst pain, or your  average pain. If you are asked for one of these two, then it should be understood that it is over the past 24  hours.  Basic Activities of Daily Living (ADL): Personal hygiene, dressing, eating, transferring, and using restroom.  Instructions: Most patients tend to report their level of pain as a combination of two factors, their physical pain and their psychosocial pain. This last one is also known as "suffering" and it is reflection of how physical pain affects you socially and psychologically. From now on, report them separately. From this point on, when asked to report your pain level, report only your physical pain. Use the following table for reference.  Pain Clinic Pain Levels (0-5/10)  Pain Level Score  Description  No Pain 0   Mild pain 1 Nagging, annoying, but does not interfere with basic activities of daily living (ADL). Patients are able to eat, bathe, get dressed, toileting (being able to get on and off the toilet and perform personal hygiene functions), transfer (move in and out of bed or a chair without assistance), and maintain continence (able to control bladder and bowel functions). Blood pressure and heart rate are unaffected. A normal heart rate for a healthy adult ranges from 60 to 100 bpm (beats per minute).   Mild to moderate pain 2 Noticeable and distracting. Impossible to hide from other people. More frequent flare-ups. Still possible to adapt and function close to normal. It can be very annoying and may have occasional stronger flare-ups. With discipline, patients may get used to it and adapt.   Moderate pain 3 Interferes significantly with activities of daily living (ADL). It becomes difficult to feed, bathe, get dressed, get on and off the toilet or to perform personal hygiene functions. Difficult to get in and out of bed or a chair without assistance. Very distracting. With effort, it can be ignored when deeply involved in activities.   Moderately severe pain 4 Impossible to ignore for more than a few minutes. With effort, patients may still be able to manage work or participate  in some social activities. Very difficult to concentrate. Signs of autonomic nervous system discharge are evident: dilated pupils (mydriasis); mild sweating (diaphoresis); sleep interference. Heart rate becomes elevated (>115 bpm). Diastolic blood pressure (lower number) rises above 100 mmHg. Patients find relief in laying down and not moving.   Severe pain 5 Intense and extremely unpleasant. Associated with frowning face and frequent crying. Pain overwhelms the senses.  Ability to do any activity or maintain social relationships becomes significantly limited. Conversation becomes difficult. Pacing back and forth is common, as getting into a comfortable position is nearly impossible. Pain wakes you up from deep sleep. Physical signs will be obvious: pupillary dilation; increased sweating; goosebumps; brisk reflexes; cold, clammy hands and feet; nausea, vomiting or dry heaves; loss of appetite; significant sleep disturbance with inability to fall asleep or to remain asleep. When persistent, significant weight loss is observed due to the complete loss of appetite and sleep deprivation.  Blood pressure and heart rate becomes significantly elevated. Caution: If elevated blood pressure triggers a pounding headache associated with blurred vision, then the patient should immediately seek attention at an urgent or emergency care unit, as these may be signs of an impending stroke.    Emergency Department Pain Levels (6-10/10)  Emergency Room Pain 6 Severely limiting. Requires emergency care and should not be seen or managed at an outpatient pain management facility. Communication becomes difficult and requires great effort. Assistance to reach the emergency  department may be required. Facial flushing and profuse sweating along with potentially dangerous increases in heart rate and blood pressure will be evident.   Distressing pain 7 Self-care is very difficult. Assistance is required to transport, or use restroom.  Assistance to reach the emergency department will be required. Tasks requiring coordination, such as bathing and getting dressed become very difficult.   Disabling pain 8 Self-care is no longer possible. At this level, pain is disabling. The individual is unable to do even the most "basic" activities such as walking, eating, bathing, dressing, transferring to a bed, or toileting. Fine motor skills are lost. It is difficult to think clearly.   Incapacitating pain 9 Pain becomes incapacitating. Thought processing is no longer possible. Difficult to remember your own name. Control of movement and coordination are lost.   The worst pain imaginable 10 At this level, most patients pass out from pain. When this level is reached, collapse of the autonomic nervous system occurs, leading to a sudden drop in blood pressure and heart rate. This in turn results in a temporary and dramatic drop in blood flow to the brain, leading to a loss of consciousness. Fainting is one of the body's self defense mechanisms. Passing out puts the brain in a calmed state and causes it to shut down for a while, in order to begin the healing process.    Summary: 1. Refer to this scale when providing [place] with your pain level. 2. Be accurate and careful when reporting your pain level. This will help with your care. 3. Over-reporting your pain level will lead to loss of credibility. 4. Even a level of 1/10 means that there is pain and will be treated at our facility. 5. High, inaccurate reporting will be documented as "Symptom Exaggeration", leading to loss of credibility and suspicions of possible secondary gains such as obtaining more narcotics, or wanting to appear disabled, for fraudulent reasons. 6. Only pain levels of 5 or below will be seen at our facility. 7. Pain levels of 6 and above will be sent to the Emergency Department and the appointment  cancelled. ____________________________________________________________________________________________

## 2018-05-03 NOTE — Progress Notes (Signed)
Patient's Name: Jennifer Harmon  MRN: [id number]  Referring Provider: Ignatius Specking, MD  DOB: 10-18-1961  PCP: Ignatius Specking, MD  DOS: 05/03/2018  Note by: Oswaldo Done, MD  Service setting: Ambulatory outpatient  Specialty: Interventional Pain Management  Patient type: Established  Location: [facility] (AMB) Pain Management Facility  Visit type: Interventional Procedure   Primary Reason for Visit: Interventional Pain Management Treatment. CC: Back Pain (low and bilateral buttocks) and Hip Pain (bilateral)  Procedure:          Anesthesia, Analgesia, Anxiolysis:  Type: Thermal Lumbar Facet, Medial Branch & Sacroiliac joint Radiofrequency Ablation Region: Lumbosacral Level: L2, L3, L4, L5, S1, S2, & S3 Medial Branch Level(s). These levels will denervate the L3-4, L4-5, and the L5-S1 lumbar facet joints, as well as the posterior Sacroiliac Joint innervation. Primary Purpose: Therapeutic Region: Posterolateral Lumbosacral Spine Laterality: Right  Type: Moderate (Conscious) Sedation combined with Local Anesthesia Indication(s): Analgesia and Anxiety Route: Intravenous (IV) IV Access: Secured Sedation: Meaningful verbal contact was maintained at all times during the procedure  Local Anesthetic: Lidocaine 1-2%   Indications: 1. Spondylosis without myelopathy or radiculopathy, lumbosacral region   2. Lumbar facet syndrome (Bilateral) (R>L)   3. Other specified dorsopathies, sacral and sacrococcygeal region   4. Chronic sacroiliac joint pain (Bilateral) (R>L)   5. Chronic low back pain  (Secondary Area of Pain) (Bilateral) (R>L)   6. DISH (diffuse idiopathic skeletal hyperostosis)    Jennifer Harmon has been dealing with the above chronic pain for longer than three months and has either failed to respond, was unable to tolerate, or simply did not get enough benefit from other more conservative therapies including, but not limited to: 1. Over-the-counter medications 2. Anti-inflammatory  medications 3. Muscle relaxants 4. Membrane stabilizers 5. Opioids 6. Physical therapy 7. Modalities (Heat, ice, etc.) 8. Invasive techniques such as nerve blocks. Jennifer Harmon has attained more than 50% relief of the pain from a series of diagnostic injections conducted in separate occasions.  Pain Score: Pre-procedure: 9 /10 Post-procedure: 0-No pain/10  Pre-op Assessment:  Jennifer Harmon is a 57 y.o. (year old), female patient, seen today for interventional treatment. She  has a past surgical history that includes Cholecystectomy; Cesarean section; and Back surgery (2008). Jennifer Harmon has a current medication list which includes the following prescription(s): citalopram, clonazepam, cyclobenzaprine, latanoprost, lyrica, magnesium oxide, oxycodone, oxycodone, oxycodone, ranitidine, hydrocodone-acetaminophen, and hydrocodone-acetaminophen, and the following Facility-Administered Medications: fentanyl and midazolam. Her primarily concern today is the Back Pain (low and bilateral buttocks) and Hip Pain (bilateral)  Initial Vital Signs:  Pulse/HCG Rate: 93ECG Heart Rate: 89 Temp: 98.6 F (37 C) Resp: 18 BP: 115/69 SpO2: 96 %  BMI: Estimated body mass index is 36.49 kg/m as calculated from the following:   Height as of this encounter: 5\' 3"  (1.6 m).   Weight as of this encounter: 206 lb (93.4 kg).  Risk Assessment: Allergies: Reviewed. She is allergic to asa [aspirin]; bupropion; and tape.  Allergy Precautions: None required Coagulopathies: Reviewed. None identified.  Blood-thinner therapy: None at this time Active Infection(s): Reviewed. None identified. Jennifer Harmon is afebrile  Site Confirmation: Jennifer Harmon was asked to confirm the procedure and laterality before marking the site Procedure checklist: Completed Consent: Before the procedure and under the influence of no sedative(s), amnesic(s), or anxiolytics, the patient was informed of the treatment options, risks and possible  complications. To fulfill our ethical and legal obligations, as recommended by the American Medical Association's Code of Ethics, I  have informed the patient of my clinical impression; the nature and purpose of the treatment or procedure; the risks, benefits, and possible complications of the intervention; the alternatives, including doing nothing; the risk(s) and benefit(s) of the alternative treatment(s) or procedure(s); and the risk(s) and benefit(s) of doing nothing. The patient was provided information about the general risks and possible complications associated with the procedure. These may include, but are not limited to: failure to achieve desired goals, infection, bleeding, organ or nerve damage, allergic reactions, paralysis, and death. In addition, the patient was informed of those risks and complications associated to Spine-related procedures, such as failure to decrease pain; infection (i.e.: Meningitis, epidural or intraspinal abscess); bleeding (i.e.: epidural hematoma, subarachnoid hemorrhage, or any other type of intraspinal or peri-dural bleeding); organ or nerve damage (i.e.: Any type of peripheral nerve, nerve root, or spinal cord injury) with subsequent damage to sensory, motor, and/or autonomic systems, resulting in permanent pain, numbness, and/or weakness of one or several areas of the body; allergic reactions; (i.e.: anaphylactic reaction); and/or death. Furthermore, the patient was informed of those risks and complications associated with the medications. These include, but are not limited to: allergic reactions (i.e.: anaphylactic or anaphylactoid reaction(s)); adrenal axis suppression; blood sugar elevation that in diabetics may result in ketoacidosis or comma; water retention that in patients with history of congestive heart failure may result in shortness of breath, pulmonary edema, and decompensation with resultant heart failure; weight gain; swelling or edema; medication-induced  neural toxicity; particulate matter embolism and blood vessel occlusion with resultant organ, and/or nervous system infarction; and/or aseptic necrosis of one or more joints. Finally, the patient was informed that Medicine is not an exact science; therefore, there is also the possibility of unforeseen or unpredictable risks and/or possible complications that may result in a catastrophic outcome. The patient indicated having understood very clearly. We have given the patient no guarantees and we have made no promises. Enough time was given to the patient to ask questions, all of which were answered to the patient's satisfaction. Jennifer Harmon has indicated that she wanted to continue with the procedure. Attestation: I, the ordering provider, attest that I have discussed with the patient the benefits, risks, side-effects, alternatives, likelihood of achieving goals, and potential problems during recovery for the procedure that I have provided informed consent. Date  Time: 05/03/2018 10:39 AM  Pre-Procedure Preparation:  Monitoring: As per clinic protocol. Respiration, ETCO2, SpO2, BP, heart rate and rhythm monitor placed and checked for adequate function Safety Precautions: Patient was assessed for positional comfort and pressure points before starting the procedure. Time-out: I initiated and conducted the "Time-out" before starting the procedure, as per protocol. The patient was asked to participate by confirming the accuracy of the "Time Out" information. Verification of the correct person, site, and procedure were performed and confirmed by me, the nursing staff, and the patient. "Time-out" conducted as per Joint Commission's Universal Protocol (UP.01.01.01). Time: 1230  Description of Procedure:          Position: Prone Laterality: Right Level: L2, L3, L4, L5, S1, S2, & S3 Medial Branch Level(s), at the L3-4, L4-5, and the L5-S1 lumbar facet joints, as well as the posterior Sacroiliac Joint. Area  Prepped: Lumbosacral Prepping solution: ChloraPrep (2% chlorhexidine gluconate and 70% isopropyl alcohol) Safety Precautions: Aspiration looking for blood return was conducted prior to all injections. At no point did we inject any substances, as a needle was being advanced. Before injecting, the patient was told to immediately notify me  if she was experiencing any new onset of "ringing in the ears, or metallic taste in the mouth". No attempts were made at seeking any paresthesias. Safe injection practices and needle disposal techniques used. Medications properly checked for expiration dates. SDV (single dose vial) medications used. After the completion of the procedure, all disposable equipment used was discarded in the proper designated medical waste containers. Local Anesthesia: Protocol guidelines were followed. The patient was positioned over the fluoroscopy table. The area was prepped in the usual manner. The time-out was completed. The target area was identified using fluoroscopy. A 12-in long, straight, sterile hemostat was used with fluoroscopic guidance to locate the targets for each level blocked. Once located, the skin was marked with an approved surgical skin marker. Once all sites were marked, the skin (epidermis, dermis, and hypodermis), as well as deeper tissues (fat, connective tissue and muscle) were infiltrated with a small amount of a short-acting local anesthetic, loaded on a 10cc syringe with a 25G, 1.5-in  Needle. An appropriate amount of time was allowed for local anesthetics to take effect before proceeding to the next step. Local Anesthetic: Lidocaine 2.0% The unused portion of the local anesthetic was discarded in the proper designated containers. Technical explanation of process:  Radiofrequency Ablation (RFA) L2 Medial Branch Nerve RFA: The target area for the L2 medial branch is at the junction of the postero-lateral aspect of the superior articular process and the superior,  posterior, and medial edge of the transverse process of L3. Under fluoroscopic guidance, a Radiofrequency needle was inserted until contact was made with os over the superior postero-lateral aspect of the pedicular shadow (target area). Sensory and motor testing was conducted to properly adjust the position of the needle. Once satisfactory placement of the needle was achieved, the numbing solution was slowly injected after negative aspiration for blood. 2.0 mL of the nerve block solution was injected without difficulty or complication. After waiting for at least 3 minutes, the ablation was performed. Once completed, the needle was removed intact. L3 Medial Branch Nerve RFA: The target area for the L3 medial branch is at the junction of the postero-lateral aspect of the superior articular process and the superior, posterior, and medial edge of the transverse process of L4. Under fluoroscopic guidance, a Radiofrequency needle was inserted until contact was made with os over the superior postero-lateral aspect of the pedicular shadow (target area). Sensory and motor testing was conducted to properly adjust the position of the needle. Once satisfactory placement of the needle was achieved, the numbing solution was slowly injected after negative aspiration for blood. 2.0 mL of the nerve block solution was injected without difficulty or complication. After waiting for at least 3 minutes, the ablation was performed. Once completed, the needle was removed intact. L4 Medial Branch Nerve RFA: The target area for the L4 medial branch is at the junction of the postero-lateral aspect of the superior articular process and the superior, posterior, and medial edge of the transverse process of L5. Under fluoroscopic guidance, a Radiofrequency needle was inserted until contact was made with os over the superior postero-lateral aspect of the pedicular shadow (target area). Sensory and motor testing was conducted to properly adjust  the position of the needle. Once satisfactory placement of the needle was achieved, the numbing solution was slowly injected after negative aspiration for blood. 2.0 mL of the nerve block solution was injected without difficulty or complication. After waiting for at least 3 minutes, the ablation was performed. Once completed, the needle  was removed intact. L5 Medial Branch Nerve RFA: The target area for the L5 medial branch is at the junction of the postero-lateral aspect of the superior articular process of S1 and the superior, posterior, and medial edge of the sacral ala. Under fluoroscopic guidance, a Radiofrequency needle was inserted until contact was made with os over the superior postero-lateral aspect of the pedicular shadow (target area). Sensory and motor testing was conducted to properly adjust the position of the needle. Once satisfactory placement of the needle was achieved, the numbing solution was slowly injected after negative aspiration for blood. 2.0 mL of the nerve block solution was injected without difficulty or complication. After waiting for at least 3 minutes, the ablation was performed. Once completed, the needle was removed intact. S1 Primary Dorsal Rami and Lateral Branch Nerve RFA: The target area for the S1 medial branch is located inferior to the junction of the S1 superior articular process and the L5 inferior articular process, posterior, inferior, and lateral to the 6 o'clock position of the L5-S1 facet joint, just superior to the S1 posterior foramen. Under fluoroscopic guidance, the Radiofrequency needle was advanced until contact was made with os over the Target area. Sensory and motor testing was conducted to properly adjust the position of the needle. Once satisfactory placement of the needle was achieved, the numbing solution was slowly injected after negative aspiration for blood. 2.0 mL of the nerve block solution was injected without difficulty or complication. After  waiting for at least 3 minutes, the ablation was performed. Once completed, the needle was removed intact. S2 Primary Dorsal Rami and Lateral Branch Nerve RFA: The target area for the S2 medial branch is at the posterior superior lateral of the S2 posterior neural foramen. Under fluoroscopic guidance, the Radiofrequency needle was advanced until contact was made with os over the Target area. Sensory and motor testing was conducted to properly adjust the position of the needle. Once satisfactory placement of the needle was achieved, the numbing solution was slowly injected after negative aspiration for blood. 2.0 mL of the nerve block solution was injected without difficulty or complication. After waiting for at least 3 minutes, the ablation was performed. Once completed, the needle was removed intact. S3 Primary Dorsal Rami and Lateral Branch Nerve RFA: The target area for the S3 medial branch is at the posterior superior lateral of the S3 posterior neural foramen. Under fluoroscopic guidance, the Radiofrequency needle was advanced until contact was made with os over the Target area. Sensory and motor testing was conducted to properly adjust the position of the needle. Once satisfactory placement of the needle was achieved, the numbing solution was slowly injected after negative aspiration for blood. 2.0 mL of the nerve block solution was injected without difficulty or complication. After waiting for at least 3 minutes, the ablation was performed. Once completed, the needle was removed intact. Radiofrequency lesioning (ablation):  Radiofrequency Generator: NeuroTherm [id number] Sensory Stimulation Parameters: 50 Hz was used to locate & identify the nerve, making sure that the needle was positioned such that there was no sensory stimulation below 0.3 V or above 0.7 V. Motor Stimulation Parameters: 2 Hz was used to evaluate the motor component. Care was taken not to lesion any nerves that demonstrated motor  stimulation of the lower extremities at an output of less than 2.5 times that of the sensory threshold, or a maximum of 2.0 V. Lesioning Technique Parameters: Standard Radiofrequency settings. (Not bipolar or pulsed.) Temperature Settings: 80 degrees C Lesioning time: 60  seconds Intra-operative Compliance: Compliant Materials & Medications: Needle(s) (Electrode/Cannula) Type: Teflon-coated, curved tip, Radiofrequency needle(s) Gauge: 22G Length: 10cm Numbing solution: 0.2% PF-Ropivacaine + Triamcinolone (40 mg/mL) diluted to a final concentration of 4 mg of Triamcinolone/mL of Ropivacaine The unused portion of the solution was discarded in the proper designated containers.  Once the entire procedure was completed, the treated area was cleaned, making sure to leave some of the prepping solution back to take advantage of its long term bactericidal properties.    Illustration of the posterior view of the lumbar spine and the posterior neural structures. Laminae of L2 through S1 are labeled. DPRL5, dorsal primary ramus of L5; DPRS1, dorsal primary ramus of S1; DPR3, dorsal primary ramus of L3; FJ, facet (zygapophyseal) joint L3-L4; I, inferior articular process of L4; LB1, lateral branch of dorsal primary ramus of L1; IAB, inferior articular branches from L3 medial branch (supplies L4-L5 facet joint); IBP, intermediate branch plexus; MB3, medial branch of dorsal primary ramus of L3; NR3, third lumbar nerve root; S, superior articular process of L5; SAB, superior articular branches from L4 (supplies L4-5 facet joint also); TP3, transverse process of L3.  Vitals:   05/03/18 1314 05/03/18 1324 05/03/18 1333 05/03/18 1345  BP: 124/75 (!) 132/53 109/66   Pulse:      Resp: 15 14 (!) 23 (!) 21  Temp:  (!) 96.7 F (35.9 C)  (!) 97.3 F (36.3 C)  TempSrc:      SpO2: 95% 97% 96% 100%  Weight:      Height:        Start Time: 1230 hrs. End Time: 1312 hrs.  Imaging Guidance (Spinal):          Type  of Imaging Technique: Fluoroscopy Guidance (Spinal) Indication(s): Assistance in needle guidance and placement for procedures requiring needle placement in or near specific anatomical locations not easily accessible without such assistance. Exposure Time: Please see nurses notes. Contrast: None used. Fluoroscopic Guidance: I was personally present during the use of fluoroscopy. "Tunnel Vision Technique" used to obtain the best possible view of the target area. Parallax error corrected before commencing the procedure. "Direction-depth-direction" technique used to introduce the needle under continuous pulsed fluoroscopy. Once target was reached, antero-posterior, oblique, and lateral fluoroscopic projection used confirm needle placement in all planes. Images permanently stored in EMR. Interpretation: No contrast injected. I personally interpreted the imaging intraoperatively. Adequate needle placement confirmed in multiple planes. Permanent images saved into the patient's record.  Antibiotic Prophylaxis:   Anti-infectives (From admission, onward)   None     Indication(s): None identified  Post-operative Assessment:  Post-procedure Vital Signs:  Pulse/HCG Rate: 9390 Temp: (!) 97.3 F (36.3 C) Resp: (!) 21 BP: 109/66 SpO2: 100 %  EBL: None  Complications: No immediate post-treatment complications observed by team, or reported by patient.  Note: The patient tolerated the entire procedure well. A repeat set of vitals were taken after the procedure and the patient was kept under observation following institutional policy, for this type of procedure. Post-procedural neurological assessment was performed, showing return to baseline, prior to discharge. The patient was provided with post-procedure discharge instructions, including a section on how to identify potential problems. Should any problems arise concerning this procedure, the patient was given instructions to immediately contact us, at any  time, without hesitation. In any case, we plan to contact the patient by telephone for a follow-up status report regarding this interventional procedure.  Comments:  No additional relevant information.  Plan of Care  Imaging Orders     DG C-Arm 1-60 Min-No Report  Procedure Orders     Radiofrequency,Lumbar     Radiofrequency Sacroiliac Joint  Medications ordered for procedure: Meds ordered this encounter  Medications  . lidocaine (XYLOCAINE) 2 % (with pres) injection 400 mg  . midazolam (VERSED) 5 MG/5ML injection 1-2 mg    Make sure Flumazenil is available in the pyxis when using this medication. If oversedation occurs, administer 0.2 mg IV over 15 sec. If after 45 sec no response, administer 0.2 mg again over 1 min; may repeat at 1 min intervals; not to exceed 4 doses (1 mg)  . fentaNYL (SUBLIMAZE) injection 25-50 mcg    Make sure Narcan is available in the pyxis when using this medication. In the event of respiratory depression (RR< 8/min): Titrate NARCAN (naloxone) in increments of 0.1 to 0.2 mg IV at 2-3 minute intervals, until desired degree of reversal.  . lactated ringers infusion 1,000 mL  . ropivacaine (PF) 2 mg/mL (0.2%) (NAROPIN) injection 18 mL  . triamcinolone acetonide (KENALOG-40) injection 80 mg  . HYDROcodone-acetaminophen (NORCO/VICODIN) 5-325 MG tablet    Sig: Take 1 tablet by mouth every 8 (eight) hours as needed for up to 7 days for severe pain.    Dispense:  21 tablet    Refill:  0    For acute post-operative pain. Not to be refilled. To last 7 days.  [name] HYDROcodone-acetaminophen (NORCO/VICODIN) 5-325 MG tablet    Sig: Take 1 tablet by mouth every 8 (eight) hours as needed for up to 7 days for moderate pain.    Dispense:  21 tablet    Refill:  0    For acute post-operative pain. Not to be refilled. To last 7 days.   Medications administered: We administered lidocaine, midazolam, fentaNYL, lactated ringers, ropivacaine (PF) 2 mg/mL (0.2%), and  triamcinolone acetonide.  See the medical record for exact dosing, route, and time of administration.  New Prescriptions   HYDROCODONE-ACETAMINOPHEN (NORCO/VICODIN) 5-325 MG TABLET    Take 1 tablet by mouth every 8 (eight) hours as needed for up to 7 days for severe pain.   HYDROCODONE-ACETAMINOPHEN (NORCO/VICODIN) 5-325 MG TABLET    Take 1 tablet by mouth every 8 (eight) hours as needed for up to 7 days for moderate pain.   Disposition: Discharge home  Discharge Date & Time: 05/03/2018; 1358 hrs.   Physician-requested Follow-up: Return for Post-RFA eval (6 wks), w/ Thad Ranger, NP.  Future Appointments  Date Time Provider Department Center  05/16/2018  9:30 AM Barbette Merino, NP [facility] None  08/24/2018  9:15 AM Pollyann Savoy, MD [facility] None   Primary Care Physician: Ignatius Specking, MD Location: [facility] Note by: Oswaldo Done, MD Date: 05/03/2018; Time: 2:19 PM  Disclaimer:  Medicine is not an Visual merchandiser. The only guarantee in medicine is that nothing is guaranteed. It is important to note that the decision to proceed with this intervention was based on the information collected from the patient. The Data and conclusions were drawn from the patient's questionnaire, the interview, and the physical examination. Because the information was provided in large part by the patient, it cannot be guaranteed that it has not been purposely or unconsciously manipulated. Every effort has been made to obtain as much relevant data as possible for this evaluation. It is important to note that the conclusions that lead to this procedure are derived in large part from the available data. Always take into account  that the treatment will also be dependent on availability of resources and existing treatment guidelines, considered by other Pain Management Practitioners as being common knowledge and practice, at the time of the intervention. For Medico-Legal purposes, it  is also important to point out that variation in procedural techniques and pharmacological choices are the acceptable norm. The indications, contraindications, technique, and results of the above procedure should only be interpreted and judged by a Board-Certified Interventional Pain Specialist with extensive familiarity and expertise in the same exact procedure and technique.

## 2018-05-04 ENCOUNTER — Telehealth: Payer: Self-pay

## 2018-05-04 NOTE — Telephone Encounter (Signed)
Post procedure phone call.  Patient states she is sore, but the pain is better.  Instructed to put heat on it today and to call us for any questions or concerns.

## 2018-05-13 DIAGNOSIS — G4733 Obstructive sleep apnea (adult) (pediatric): Secondary | ICD-10-CM | POA: Diagnosis not present

## 2018-05-15 ENCOUNTER — Telehealth: Payer: Self-pay | Admitting: Pain Medicine

## 2018-05-15 NOTE — Telephone Encounter (Signed)
Patient calling about meds refill for Oxycodone. Has appt with C King 05-16-18

## 2018-05-16 ENCOUNTER — Encounter: Payer: Self-pay | Admitting: Nurse Practitioner

## 2018-05-16 ENCOUNTER — Ambulatory Visit: Payer: PPO | Attending: Nurse Practitioner | Admitting: Nurse Practitioner

## 2018-05-16 ENCOUNTER — Other Ambulatory Visit: Payer: Self-pay

## 2018-05-16 VITALS — BP 99/72 | HR 94 | Temp 98.5°F | Resp 18 | Ht 63.0 in | Wt 206.0 lb

## 2018-05-16 DIAGNOSIS — E7800 Pure hypercholesterolemia, unspecified: Secondary | ICD-10-CM | POA: Insufficient documentation

## 2018-05-16 DIAGNOSIS — M7918 Myalgia, other site: Secondary | ICD-10-CM | POA: Diagnosis not present

## 2018-05-16 DIAGNOSIS — F329 Major depressive disorder, single episode, unspecified: Secondary | ICD-10-CM | POA: Insufficient documentation

## 2018-05-16 DIAGNOSIS — Z79891 Long term (current) use of opiate analgesic: Secondary | ICD-10-CM | POA: Diagnosis not present

## 2018-05-16 DIAGNOSIS — M47816 Spondylosis without myelopathy or radiculopathy, lumbar region: Secondary | ICD-10-CM

## 2018-05-16 DIAGNOSIS — Z888 Allergy status to other drugs, medicaments and biological substances status: Secondary | ICD-10-CM | POA: Insufficient documentation

## 2018-05-16 DIAGNOSIS — Z87891 Personal history of nicotine dependence: Secondary | ICD-10-CM | POA: Insufficient documentation

## 2018-05-16 DIAGNOSIS — M170 Bilateral primary osteoarthritis of knee: Secondary | ICD-10-CM | POA: Insufficient documentation

## 2018-05-16 DIAGNOSIS — G8929 Other chronic pain: Secondary | ICD-10-CM

## 2018-05-16 DIAGNOSIS — M47817 Spondylosis without myelopathy or radiculopathy, lumbosacral region: Secondary | ICD-10-CM

## 2018-05-16 DIAGNOSIS — Z5181 Encounter for therapeutic drug level monitoring: Secondary | ICD-10-CM | POA: Insufficient documentation

## 2018-05-16 DIAGNOSIS — Z96653 Presence of artificial knee joint, bilateral: Secondary | ICD-10-CM | POA: Insufficient documentation

## 2018-05-16 DIAGNOSIS — G894 Chronic pain syndrome: Secondary | ICD-10-CM

## 2018-05-16 DIAGNOSIS — Z79899 Other long term (current) drug therapy: Secondary | ICD-10-CM | POA: Insufficient documentation

## 2018-05-16 DIAGNOSIS — M5116 Intervertebral disc disorders with radiculopathy, lumbar region: Secondary | ICD-10-CM | POA: Insufficient documentation

## 2018-05-16 DIAGNOSIS — Z886 Allergy status to analgesic agent status: Secondary | ICD-10-CM | POA: Diagnosis not present

## 2018-05-16 DIAGNOSIS — K219 Gastro-esophageal reflux disease without esophagitis: Secondary | ICD-10-CM | POA: Insufficient documentation

## 2018-05-16 DIAGNOSIS — Z791 Long term (current) use of non-steroidal anti-inflammatories (NSAID): Secondary | ICD-10-CM | POA: Diagnosis not present

## 2018-05-16 DIAGNOSIS — M62838 Other muscle spasm: Secondary | ICD-10-CM

## 2018-05-16 DIAGNOSIS — F419 Anxiety disorder, unspecified: Secondary | ICD-10-CM | POA: Insufficient documentation

## 2018-05-16 DIAGNOSIS — M792 Neuralgia and neuritis, unspecified: Secondary | ICD-10-CM

## 2018-05-16 DIAGNOSIS — M797 Fibromyalgia: Secondary | ICD-10-CM

## 2018-05-16 MED ORDER — OXYCODONE HCL 5 MG PO TABS: 5.0000 mg | ORAL_TABLET | Freq: Two times a day (BID) | ORAL | 0 refills | Status: DC | PRN

## 2018-05-16 MED ORDER — LYRICA 100 MG PO CAPS: 100.0000 mg | ORAL_CAPSULE | Freq: Two times a day (BID) | ORAL | 2 refills | Status: DC

## 2018-05-16 MED ORDER — CYCLOBENZAPRINE HCL 10 MG PO TABS: 10.0000 mg | ORAL_TABLET | Freq: Two times a day (BID) | ORAL | 2 refills | Status: DC | PRN

## 2018-05-16 NOTE — Progress Notes (Signed)
Nursing Pain Medication Assessment:  Safety precautions to be maintained throughout the outpatient stay will include: orient to surroundings, keep bed in low position, maintain call bell within reach at all times, provide assistance with transfer out of bed and ambulation.  Medication Inspection Compliance: Pill count conducted under aseptic conditions, in front of the patient. Neither the pills nor the bottle was removed from the patient's sight at any time. Once count was completed pills were immediately returned to the patient in their original bottle.  Medication #1: Oxycodone IR Pill/Patch Count: patient states there are no pills in her bottle at home Pill/Patch Appearance: no pills brought to appointment Bottle Appearance: No container available. Did not bring bottle(s) to appointment. Filled Date: 06 / 27 / 2019 Last Medication intake:  Day before yesterday  Medication #2: Hydrocodone/APAP Pill/Patch Count: 15 of 21 pills remain Pill/Patch Appearance: Markings consistent with prescribed medication Bottle Appearance: Standard pharmacy container. Clearly labeled. Filled Date: 06 / 27 / 2019 Last Medication intake:  Yesterday

## 2018-05-16 NOTE — Patient Instructions (Addendum)
____________________________________________________________________________________________  Medication Rules  Applies to: All patients receiving prescriptions (written or electronic).  Pharmacy of record: Pharmacy where electronic prescriptions will be sent. If written prescriptions are taken to a different pharmacy, please inform the nursing staff. The pharmacy listed in the electronic medical record should be the one where you would like electronic prescriptions to be sent.  Prescription refills: Only during scheduled appointments. Applies to both, written and electronic prescriptions.  NOTE: The following applies primarily to controlled substances (Opioid* Pain Medications).   Patient's responsibilities: 1. Pain Pills: Bring all pain pills to every appointment (except for procedure appointments). 2. Pill Bottles: Bring pills in original pharmacy bottle. Always bring newest bottle. Bring bottle, even if empty. 3. Medication refills: You are responsible for knowing and keeping track of what medications you need refilled. The day before your appointment, write a list of all prescriptions that need to be refilled. Bring that list to your appointment and give it to the admitting nurse. Prescriptions will be written only during appointments. If you forget a medication, it will not be "Called in", "Faxed", or "electronically sent". You will need to get another appointment to get these prescribed. 4. Prescription Accuracy: You are responsible for carefully inspecting your prescriptions before leaving our office. Have the discharge nurse carefully go over each prescription with you, before taking them home. Make sure that your name is accurately spelled, that your address is correct. Check the name and dose of your medication to make sure it is accurate. Check the number of pills, and the written instructions to make sure they are clear and accurate. Make sure that you are given enough medication to last  until your next medication refill appointment. 5. Taking Medication: Take medication as prescribed. Never take more pills than instructed. Never take medication more frequently than prescribed. Taking less pills or less frequently is permitted and encouraged, when it comes to controlled substances (written prescriptions).  6. Inform other Doctors: Always inform, all of your healthcare providers, of all the medications you take. 7. Pain Medication from other Providers: You are not allowed to accept any additional pain medication from any other Doctor or Healthcare provider. There are two exceptions to this rule. (see below) In the event that you require additional pain medication, you are responsible for notifying us, as stated below. 8. Medication Agreement: You are responsible for carefully reading and following our Medication Agreement. This must be signed before receiving any prescriptions from our practice. Safely store a copy of your signed Agreement. Violations to the Agreement will result in no further prescriptions. (Additional copies of our Medication Agreement are available upon request.) 9. Laws, Rules, & Regulations: All patients are expected to follow all Federal and State Laws, Statutes, Rules, & Regulations. Ignorance of the Laws does not constitute a valid excuse. The use of any illegal substances is prohibited. 10. Adopted CDC guidelines & recommendations: Target dosing levels will be at or below 60 MME/day. Use of benzodiazepines** is not recommended.  Exceptions: There are only two exceptions to the rule of not receiving pain medications from other Healthcare Providers. 1. Exception #1 (Emergencies): In the event of an emergency (i.e.: accident requiring emergency care), you are allowed to receive additional pain medication. However, you are responsible for: As soon as you are able, call our office [phone], at any time of the day or night, and leave a message stating your name, the  date and nature of the emergency, and the name and dose of the medication   prescribed. In the event that your call is answered by a member of our staff, make sure to document and save the date, time, and the name of the person that took your information.  2. Exception #2 (Planned Surgery): In the event that you are scheduled by another doctor or dentist to have any type of surgery or procedure, you are allowed (for a period no longer than 30 days), to receive additional pain medication, for the acute post-op pain. However, in this case, you are responsible for picking up a copy of our "Post-op Pain Management for Surgeons" handout, and giving it to your surgeon or dentist. This document is available at our office, and does not require an appointment to obtain it. Simply go to our office during business hours (Monday-Thursday from 8:00 AM to 4:00 PM) (Friday 8:00 AM to 12:00 Noon) or if you have a scheduled appointment with [place], prior to your surgery, and ask for it by name. In addition, you will need to provide [place] with your name, name of your surgeon, type of surgery, and date of procedure or surgery.  *Opioid medications include: morphine, codeine, oxycodone, oxymorphone, hydrocodone, hydromorphone, meperidine, tramadol, tapentadol, buprenorphine, fentanyl, methadone. **Benzodiazepine medications include: diazepam (Valium), alprazolam (Xanax), clonazepam (Klonopine), lorazepam (Ativan), clorazepate (Tranxene), chlordiazepoxide (Librium), estazolam (Prosom), oxazepam (Serax), temazepam (Restoril), triazolam (Halcion) (Last updated: 01/04/2018) ____________________________________________________________________________________________   BMI Assessment: Estimated body mass index is 36.49 kg/m as calculated from the following:   Height as of this encounter: 5\' 3"  (1.6 m).   Weight as of this encounter: 206 lb (93.4 kg).  BMI interpretation table: BMI level Category Range association with higher incidence  of chronic pain  <18 kg/m2 Underweight   18.5-24.9 kg/m2 Ideal body weight   25-29.9 kg/m2 Overweight Increased incidence by 20%  30-34.9 kg/m2 Obese (Class I) Increased incidence by 68%  35-39.9 kg/m2 Severe obesity (Class II) Increased incidence by 136%  >40 kg/m2 Extreme obesity (Class III) Increased incidence by 254%   Patient's current BMI Ideal Body weight  Body mass index is 36.49 kg/m. Ideal body weight: 52.4 kg (115 lb 8.3 oz) Adjusted ideal body weight: 68.8 kg (151 lb 11.4 oz)   BMI Readings from Last 4 Encounters:  05/16/18 36.49 kg/m  05/03/18 36.49 kg/m  03/14/18 36.14 kg/m  02/15/18 36.31 kg/m   Wt Readings from Last 4 Encounters:  05/16/18 206 lb (93.4 kg)  05/03/18 206 lb (93.4 kg)  03/14/18 204 lb (92.5 kg)  02/15/18 205 lb (93 kg)

## 2018-05-16 NOTE — Progress Notes (Signed)
Patient's Name: Jennifer Harmon  MRN: [id number]  Referring Provider: Glenda Chroman, MD  DOB: 1961/06/09  PCP: Glenda Chroman, MD  DOS: 05/16/2018  Note by: Vevelyn Francois NP  Service setting: Ambulatory outpatient  Specialty: Interventional Pain Management  Location: [facility] (AMB) Pain Management Facility    Patient type: Established    Primary Reason(s) for Visit: Encounter for prescription drug management. (Level of risk: moderate)  CC: Pain (bilateal buttock) and Hip Pain (right is worse)  HPI  [name] is a 57 y.o. year old, female patient, who comes today for a medication management evaluation. She has Dyslipidemia; History of gastroesophageal reflux (GERD); History of sleep apnea; History of depression; History of anxiety; Fibromyalgia; DDD (degenerative disc disease), lumbar; S/P partial knee replacement (Left); S/P total knee replacement (Right); Osteoarthritis of feet (Bilateral); History of diverticulosis; Former smoker; Chronic diarrhea; Chronic inflammatory arthritis; History of knee arthroplasty (Bilateral); Osteoarthritis of knee (Right); Symptomatic mammary hypertrophy; Chronic upper back pain (Primary Area of Pain) (Bilateral) (R>L); Chronic neck pain (Tertiary Area of Pain) (Bilateral) (R>L); Chronic shoulder pain; Chronic upper extremity pain (Bilateral) (R>L); Chronic pain syndrome; Long term current use of opiate analgesic; Disorder of skeletal system; Chronic sacroiliac joint pain (Bilateral) (R>L); Chronic low back pain  (Secondary Area of Pain) (Bilateral) (R>L); Chronic knee pain (Fourth Area of Pain) (Bilateral) (R>L); Long term prescription opiate use; NSAID long-term use; Opiate use; Pharmacologic therapy; Problems influencing health status; Neurogenic pain; Chronic musculoskeletal pain; Chronic recurrent lower extremity muscle spasms (Bilateral); Long term prescription benzodiazepine use; Thoracic radiculopathy due to osteoarthritis of spine; Thoracic radiculitis (T6/T7)  (Right); Chronic lower extremity pain (Bilateral) (R>L); Chronic lower extremity radicular pain (L5) (Right); DISH (diffuse idiopathic skeletal hyperostosis); 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 Her primarily concern today is the Pain (bilateal buttock) and Hip Pain (right is worse)  Pain Assessment: Location: Right, Left Hip Radiating: buttocks Onset: More than a month ago Duration: Chronic pain Quality: Aching, Constant Severity: 7 /10 (subjective, self-reported pain score)  Note: Reported level is compatible with observation. Clinically the patient looks like a 2/10 A 2/10 is viewed as "Mild to Moderate" and described as noticeable and distracting. Impossible to hide from other people. More frequent flare-ups. Still possible to adapt and function close to normal. It can be very annoying and may have occasional stronger flare-ups. With discipline, patients may get used to it and adapt.       When using our objective Pain Scale, levels between 6 and 10/10 are said to belong in an emergency room, as it progressively worsens from a 6/10, described as severely limiting, requiring emergency care not usually available at an outpatient pain management facility. At a 6/10 level, communication becomes difficult and requires great effort. Assistance to reach the emergency department may be required. Facial flushing and profuse sweating along with potentially dangerous increases in heart rate and blood pressure will be  evident. Timing: Constant Modifying factors: medications, rest,heat BP: 99/72  HR: 94  [name] was last scheduled for an appointment on Visit date not found for medication management. During today's appointment we reviewed Ms. Name's chronic pain status, as  well as her outpatient medication regimen. She states that she has weakness. She admits that her buttock pain is worse which is secondary to the procedure. She is hopeful that this will continue to improve. She admits that she has none of her Oxycodone however she should have 2 additional prescriptions.   The patient  reports that she does not use drugs. Her body mass index is 36.49 kg/m.  Further details on both, my assessment(s), as well as the proposed treatment plan, please see below.  Controlled Substance Pharmacotherapy Assessment REMS (Risk Evaluation and Mitigation Strategy)  Analgesic: Oxycodone IR 5 mg twice daily MME/day:15 mg/day.   Hart Rochester, RN  05/16/2018 10:01 AM  Sign at close encounter Nursing Pain Medication Assessment:  Safety precautions to be maintained throughout the outpatient stay will include: orient to surroundings, keep bed in low position, maintain call bell within reach at all times, provide assistance with transfer out of bed and ambulation.  Medication Inspection Compliance: Pill count conducted under aseptic conditions, in front of the patient. Neither the pills nor the bottle was removed from the patient's sight at any time. Once count was completed pills were immediately returned to the patient in their original bottle.  Medication #1: Oxycodone IR Pill/Patch Count: patient states there are no pills in her bottle at home Pill/Patch Appearance: no pills brought to appointment Bottle Appearance: No container available. Did not bring bottle(s) to appointment. Filled Date: 06 / 27 / 2019 Last Medication intake:  Day before yesterday  Medication #2: Hydrocodone/APAP Pill/Patch Count: 15  of 21 pills remain Pill/Patch Appearance: Markings consistent with prescribed medication Bottle Appearance: Standard pharmacy container. Clearly labeled. Filled Date: 06 / 27 / 2019 Last Medication intake:  Yesterday   Pharmacokinetics: Liberation and absorption (onset of action): WNL Distribution (time to peak effect): WNL Metabolism and excretion (duration of action): WNL         Pharmacodynamics: Desired effects: Analgesia: Ms. Hanisch reports >50% benefit. Functional ability: Patient reports that medication allows her to accomplish basic ADLs Clinically meaningful improvement in function (CMIF): Sustained CMIF goals met Perceived effectiveness: Described as relatively effective, allowing for increase in activities of daily living (ADL) Undesirable effects: Side-effects or Adverse reactions: None reported Monitoring: [place] PMP: Online review of the past 65-monthperiod conducted. Compliant with practice rules and regulations Last UDS on record: Summary  Date Value Ref Range Status  09/06/2017 FINAL  Final    Comment:    ==================================================================== [facility] COMP DRUG ANALYSIS,UR ==================================================================== Test                             Result       Flag       Units Drug Present and Declared for Prescription Verification   7-aminoclonazepam              238          EXPECTED   ng/mg creat    7-aminoclonazepam is an expected metabolite of clonazepam. Source    of clonazepam is a scheduled prescription medication.   Oxycodone                      2298         EXPECTED   ng/mg creat   Oxymorphone                    376  EXPECTED   ng/mg creat   Noroxycodone                   3913         EXPECTED   ng/mg creat   Noroxymorphone                 77           EXPECTED   ng/mg creat    Sources of oxycodone are scheduled prescription medications.    Oxymorphone, noroxycodone, and noroxymorphone  are expected    metabolites of oxycodone. Oxymorphone is also available as a    scheduled prescription medication.   Pregabalin                     PRESENT      EXPECTED   Cyclobenzaprine                PRESENT      EXPECTED   Desmethylcyclobenzaprine       PRESENT      EXPECTED    Desmethylcyclobenzaprine is an expected metabolite of    cyclobenzaprine.   Bupropion                      PRESENT      EXPECTED   Hydroxybupropion               PRESENT      EXPECTED    Hydroxybupropion is an expected metabolite of bupropion.   Acetaminophen                  PRESENT      EXPECTED Drug Present not Declared for Prescription Verification   Diphenhydramine                PRESENT      UNEXPECTED Drug Absent but Declared for Prescription Verification   Hydrocodone                    Not Detected UNEXPECTED ng/mg creat   Citalopram                     Not Detected UNEXPECTED   Diclofenac                     Not Detected UNEXPECTED    Diclofenac, as indicated in the declared medication list, is not    always detected even when used as directed.   Salicylate                     Not Detected UNEXPECTED    Aspirin, as indicated in the declared medication list, is not    always detected even when used as directed. ==================================================================== Test                      Result    Flag   Units      Ref Range   Creatinine              127              mg/dL      >=20 ==================================================================== Declared Medications:  The flagging and interpretation on this report are based on the  following declared medications.  Unexpected results may arise from  inaccuracies in the declared medications.  **Note: The testing scope of this panel includes these medications:  Bupropion (Wellbutrin)  Citalopram (Celexa)  Clonazepam (Klonopin)  Cyclobenzaprine (Flexeril)  Hydrocodone (Norco)  Oxycodone  Pregabalin (Lyrica)  **Note: The  testing scope of this panel does not include small to  moderate amounts of these reported medications:  Acetaminophen (Norco)  Aspirin (Aspirin 81)  Diclofenac (Voltaren)  **Note: The testing scope of this panel does not include following  reported medications:  Famotidine (Pepcid)  Latanoprost (Xalatan)  Pantoprazole  Ranitidine (Zantac)  Simvastatin (Zocor) ==================================================================== For clinical consultation, please call [phone]. ====================================================================    UDS interpretation: Compliant          Medication Assessment Form: Reviewed. Patient indicates being compliant with therapy Treatment compliance: Compliant Risk Assessment Profile: Aberrant behavior: See prior evaluations. None observed or detected today Comorbid factors increasing risk of overdose: See prior notes. No additional risks detected today Risk of substance use disorder (SUD): Low Opioid Risk Tool - 05/03/18 1047      Family History of Substance Abuse   Alcohol  Positive Female    Illegal Drugs  Negative    Rx Drugs  Negative      Personal History of Substance Abuse   Alcohol  Negative    Illegal Drugs  Negative    Rx Drugs  Negative      Age   Age between 41-45 years   No      History of Preadolescent Sexual Abuse   History of Preadolescent Sexual Abuse  Negative or Female      Psychological Disease   Psychological Disease  Negative    Depression  Positive      Total Score   Opioid Risk Tool Scoring  2    Opioid Risk Interpretation  Low Risk      ORT Scoring interpretation table:  Score <3 = Low Risk for SUD  Score between 4-7 = Moderate Risk for SUD  Score >8 = High Risk for Opioid Abuse   Risk Mitigation Strategies:  Patient Counseling: Covered Patient-Prescriber Agreement (PPA): Present and active  Notification to other healthcare providers: Done  Pharmacologic Plan: No change in therapy, at this  time.             Laboratory Chemistry  Inflammation Markers (CRP: Acute Phase) (ESR: Chronic Phase) Lab Results  Component Value Date   CRP <0.8 09/06/2017   ESRSEDRATE 18 09/06/2017                         Rheumatology Markers Lab Results  Component Value Date   RF <14 07/03/2017                        Renal Function Markers Lab Results  Component Value Date   BUN 16 09/06/2017   CREATININE 0.67 09/06/2017   GFRAA >60 09/06/2017   GFRNONAA >60 09/06/2017                             Hepatic Function Markers Lab Results  Component Value Date   AST 26 09/06/2017   ALT 31 09/06/2017   ALBUMIN 4.8 09/06/2017   ALKPHOS 100 09/06/2017                        Electrolytes Lab Results  Component Value Date   NA 140 09/06/2017   K 4.0 09/06/2017   CL 104 09/06/2017   CALCIUM 10.0 09/06/2017   MG 2.0 07/03/2017  Neuropathy Markers Lab Results  Component Value Date   GTXMIWOE32 122 09/06/2017                        Bone Pathology Markers No results found for: VD25OH, QM250IB7CWU, GQ9169IH0, TU8828MK3, 25OHVITD1, 25OHVITD2, 25OHVITD3, TESTOFREE, TESTOSTERONE                       Coagulation Parameters Lab Results  Component Value Date   PLT 197 07/03/2017                        Cardiovascular Markers Lab Results  Component Value Date   CKTOTAL 130 07/03/2017   HGB 14.0 07/03/2017   HCT 43.3 07/03/2017                         CA Markers No results found for: CEA, CA125, LABCA2                      Note: Lab results reviewed.  Recent Diagnostic Imaging Results  DG C-Arm 1-60 Min-No Report Fluoroscopy was utilized by the requesting physician.  No radiographic  interpretation.   Complexity Note: Imaging results reviewed. Results shared with Ms. Rebuck, using Layman's terms.                         Meds   Current Outpatient Medications:  .  citalopram (CELEXA) 20 MG tablet, Take 20 mg by mouth daily.  , Disp: , Rfl:  .   clonazePAM (KLONOPIN) 1 MG tablet, Take 1 mg by mouth daily. , Disp: , Rfl:  .  cyclobenzaprine (FLEXERIL) 10 MG tablet, Take 1 tablet (10 mg total) by mouth 2 (two) times daily as needed for muscle spasms., Disp: 60 tablet, Rfl: 2 .  HYDROcodone-acetaminophen (NORCO/VICODIN) 5-325 MG tablet, Take 1 tablet by mouth every 8 (eight) hours as needed for up to 7 days for moderate pain., Disp: 21 tablet, Rfl: 0 .  latanoprost (XALATAN) 0.005 % ophthalmic solution, Place 1 drop into both eyes daily., Disp: , Rfl:  .  LYRICA 100 MG capsule, Take 1 capsule (100 mg total) by mouth 2 (two) times daily., Disp: 60 capsule, Rfl: 2 .  Magnesium Oxide 500 MG CAPS, Take 1 capsule (500 mg total) by mouth 2 (two) times daily at 8 am and 10 pm., Disp: 60 capsule, Rfl: 2 .  [START ON 08/07/2018] oxyCODONE (OXY IR/ROXICODONE) 5 MG immediate release tablet, Take 1 tablet (5 mg total) by mouth 2 (two) times daily as needed for severe pain., Disp: 60 tablet, Rfl: 0 .  [START ON 07/08/2018] oxyCODONE (OXY IR/ROXICODONE) 5 MG immediate release tablet, Take 1 tablet (5 mg total) by mouth 2 (two) times daily as needed for severe pain., Disp: 60 tablet, Rfl: 0 .  ranitidine (ZANTAC) 150 MG tablet, Take 150 mg by mouth daily., Disp: , Rfl:  .  oxyCODONE (OXY IR/ROXICODONE) 5 MG immediate release tablet, Take 1 tablet (5 mg total) by mouth 2 (two) times daily as needed for severe pain., Disp: 60 tablet, Rfl: 0 .  ranitidine (ZANTAC) 300 MG tablet, , Disp: , Rfl:   ROS  Constitutional: Denies any fever or chills Gastrointestinal: No reported hemesis, hematochezia, vomiting, or acute GI distress Musculoskeletal: Denies any acute onset joint swelling, redness, loss of ROM, or weakness Neurological: No reported episodes of acute onset  apraxia, aphasia, dysarthria, agnosia, amnesia, paralysis, loss of coordination, or loss of consciousness  Allergies  Ms. Elko is allergic to asa [aspirin]; bupropion; and tape.  [place]  Drug: Ms.  Poteet  reports that she does not use drugs. Alcohol:  reports that she does not drink alcohol. Tobacco:  reports that she quit smoking about 2 years ago. Her smoking use included pipe. She has a 10.00 pack-year smoking history. She uses smokeless tobacco. Medical:  has a past medical history of Anxiety, Chronic chest pain, Depression, DJD (degenerative joint disease), Fibromyalgia, GERD (gastroesophageal reflux disease), and Hypercholesterolemia. Surgical: Ms. Lahm  has a past surgical history that includes Cholecystectomy; Cesarean section; and Back surgery (2008). Family: family history includes Arthritis in her father; Hypertension in her mother.  Constitutional Exam  General appearance: Well nourished, well developed, and well hydrated. In no apparent acute distress Vitals:   05/16/18 0950  BP: 99/72  Pulse: 94  Resp: 18  Temp: 98.5 F (36.9 C)  TempSrc: Oral  SpO2: 99%  Weight: 206 lb (93.4 kg)  Height: '5\' 3"'  (1.6 m)  Psych/Mental status: Alert, oriented x 3 (person, place, & time)       Eyes: PERLA Respiratory: No evidence of acute respiratory distress  Lumbar Spine Area Exam  Skin & Axial Inspection: No masses, redness, or swelling Alignment: Symmetrical Functional ROM: Unrestricted ROM       Stability: No instability detected Muscle Tone/Strength: Functionally intact. No obvious neuro-muscular anomalies detected. Sensory (Neurological): Unimpaired Palpation: Tender       Provocative Tests: Lumbar Hyperextension/rotation test: deferred today       Lumbar quadrant test (Kemp's test): deferred today       Lumbar Lateral bending test: deferred today       Patrick's Maneuver: deferred today                   FABER test: deferred today       Thigh-thrust test: deferred today       S-I compression test: deferred today       S-I distraction test: deferred today        Gait & Posture Assessment  Ambulation: Unassisted Gait: Relatively normal for age and body  habitus Posture: WNL   Lower Extremity Exam    Side: Right lower extremity  Side: Left lower extremity  Stability: No instability observed          Stability: No instability observed          Skin & Extremity Inspection: Evidence of prior arthroplastic surgery  Skin & Extremity Inspection: Evidence of prior arthroplastic surgery  Functional ROM: Unrestricted ROM                  Functional ROM: Unrestricted ROM                  Muscle Tone/Strength: Functionally intact. No obvious neuro-muscular anomalies detected.  Muscle Tone/Strength: Functionally intact. No obvious neuro-muscular anomalies detected.  Sensory (Neurological): Unimpaired  Sensory (Neurological): Unimpaired  Palpation: No palpable anomalies  Palpation: No palpable anomalies   Assessment  Primary Diagnosis & Pertinent Problem List: The primary encounter diagnosis was Spondylosis without myelopathy or radiculopathy, lumbosacral region. Diagnoses of Fibromyalgia, Lumbar facet syndrome (Bilateral) (R>L), Neurogenic pain, Chronic musculoskeletal pain, Muscle spasms of both lower extremities, Long term prescription opiate use, and Chronic pain syndrome were also pertinent to this visit.  Status Diagnosis  Persistent Controlled Persistent 1. Spondylosis without myelopathy or radiculopathy, lumbosacral region  2. Fibromyalgia   3. Lumbar facet syndrome (Bilateral) (R>L)   4. Neurogenic pain   5. Chronic musculoskeletal pain   6. Muscle spasms of both lower extremities   7. Long term prescription opiate use   8. Chronic pain syndrome     Problems updated and reviewed during this visit: No problems updated. Plan of Care  Pharmacotherapy (Medications Ordered): Meds ordered this encounter  Medications  . LYRICA 100 MG capsule    Sig: Take 1 capsule (100 mg total) by mouth 2 (two) times daily.    Dispense:  60 capsule    Refill:  2    Do not place medication on "Automatic Refill". Fill one day early if pharmacy is closed  on scheduled refill date.    Order Specific Question:   Supervising Provider    Answer:   Milinda Pointer [phone]  . cyclobenzaprine (FLEXERIL) 10 MG tablet    Sig: Take 1 tablet (10 mg total) by mouth 2 (two) times daily as needed for muscle spasms.    Dispense:  60 tablet    Refill:  2    Do not place medication on "Automatic Refill". Fill one day early if pharmacy is closed on scheduled refill date.    Order Specific Question:   Supervising Provider    Answer:   Milinda Pointer [phone]  . oxyCODONE (OXY IR/ROXICODONE) 5 MG immediate release tablet    Sig: Take 1 tablet (5 mg total) by mouth 2 (two) times daily as needed for severe pain.    Dispense:  60 tablet    Refill:  0    Do not place this medication, or any other prescription from our practice, on "Automatic Refill". Patient may have prescription filled one day early if pharmacy is closed on scheduled refill date. Do not fill until: 08/07/2018 To last until: 09/06/2018    Order Specific Question:   Supervising Provider    Answer:   Milinda Pointer [phone]  . oxyCODONE (OXY IR/ROXICODONE) 5 MG immediate release tablet    Sig: Take 1 tablet (5 mg total) by mouth 2 (two) times daily as needed for severe pain.    Dispense:  60 tablet    Refill:  0    Do not place this medication, or any other prescription from our practice, on "Automatic Refill". Patient may have prescription filled one day early if pharmacy is closed on scheduled refill date. Do not fill until: 07/08/2018 To last until: 08/07/2018    Order Specific Question:   Supervising Provider    Answer:   Milinda Pointer [phone]   New Prescriptions   No medications on file   Medications administered today: Mattelyn Imhoff. Hearld had no medications administered during this visit. Lab-work, procedure(s), and/or referral(s): Orders Placed This Encounter  Procedures  . ToxASSURE Select 13 (MW), Urine   Imaging and/or referral(s): None  Interventional management  options: Planned, scheduled, and/or pending:   Not at this time.    Considering:   Diagnostic Trigger point injections Diagnostic bilateral LESI Diagnostic bilateral lumbar facet nerve block #3 Possiblebilateral lumbar facet RFA Possiblebilateral Racz(Epidural Neurolysis) Diagnostic thoracic facet nerve block Possible thoracic facet RFA Diagnostic bilateral CESI Diagnosticbilateral cervical facet nerve block Possiblebilateral cervical facet RFA Diagnostic right shoulder intra-articular injection Diagnostic right suprascapular nerve block Possibleright suprascapular RFA Diagnostic bilateral genicular nerve block Possible bilateral genicular RFA   Palliative PRN treatment(s):   None at this time   Provider-requested follow-up: Return in about 3 months (around 08/16/2018) for [facility]Mgmt with  [facility]), in addition, 4 week Follow up RFA procedure, w/ Dr. Dossie Arbour.  Future Appointments  Date Time Provider [place]  06/18/2018  8:15 AM Milinda Pointer, MD [facility] None  08/15/2018  8:30 AM Vevelyn Francois, NP [facility] None  08/24/2018  9:15 AM Bo Merino, MD [facility] None   Primary Care Physician: Glenda Chroman, MD Location: [facility] Outpatient [facility] Management Facility Note by: Vevelyn Francois NP Date: 05/16/2018; Time: 8:34 AM  Pain Score Disclaimer: We use the NRS-11 scale. This is a self-reported, subjective measurement of pain severity with only modest accuracy. It is used primarily to identify changes within a particular patient. It must be understood that outpatient pain scales are significantly less accurate that those used for research, where they can be applied under ideal controlled circumstances with minimal exposure to variables. In reality, the score is likely to be a combination of pain intensity and pain affect, where pain affect describes the degree of emotional arousal or changes in action readiness caused by the sensory  experience of pain. Factors such as social and work situation, setting, emotional state, anxiety levels, expectation, and prior pain experience may influence pain perception and show large inter-individual differences that may also be affected by time variables.  Patient instructions provided during this appointment: Patient Instructions   ____________________________________________________________________________________________  Medication Rules  Applies to: All patients receiving prescriptions (written or electronic).  Pharmacy of record: Pharmacy where electronic prescriptions will be sent. If written prescriptions are taken to a different pharmacy, please inform the nursing staff. The pharmacy listed in the electronic medical record should be the one where you would like electronic prescriptions to be sent.  Prescription refills: Only during scheduled appointments. Applies to both, written and electronic prescriptions.  NOTE: The following applies primarily to controlled substances (Opioid* Pain Medications).   Patient's responsibilities: 1. Pain Pills: Bring all pain pills to every appointment (except for procedure appointments). 2. Pill Bottles: Bring pills in original pharmacy bottle. Always bring newest bottle. Bring bottle, even if empty. 3. Medication refills: You are responsible for knowing and keeping track of what medications you need refilled. The day before your appointment, write a list of all prescriptions that need to be refilled. Bring that list to your appointment and give it to the admitting nurse. Prescriptions will be written only during appointments. If you forget a medication, it will not be "Called in", "Faxed", or "electronically sent". You will need to get another appointment to get these prescribed. 4. Prescription Accuracy: You are responsible for carefully inspecting your prescriptions before leaving our office. Have the discharge nurse carefully go over each  prescription with you, before taking them home. Make sure that your name is accurately spelled, that your address is correct. Check the name and dose of your medication to make sure it is accurate. Check the number of pills, and the written instructions to make sure they are clear and accurate. Make sure that you are given enough medication to last until your next medication refill appointment. 5. Taking Medication: Take medication as prescribed. Never take more pills than instructed. Never take medication more frequently than prescribed. Taking less pills or less frequently is permitted and encouraged, when it comes to controlled substances (written prescriptions).  6. Inform other Doctors: Always inform, all of your healthcare providers, of all the medications you take. 7. Pain Medication from other Providers: You are not allowed to accept any additional pain medication from any other Doctor or Healthcare provider. There are  two exceptions to this rule. (see below) In the event that you require additional pain medication, you are responsible for notifying us, as stated below. 8. Medication Agreement: You are responsible for carefully reading and following our Medication Agreement. This must be signed before receiving any prescriptions from our practice. Safely store a copy of your signed Agreement. Violations to the Agreement will result in no further prescriptions. (Additional copies of our Medication Agreement are available upon request.) 9. Laws, Rules, & Regulations: All patients are expected to follow all Federal and [employer], Rules, [employer]. Ignorance of the Laws does not constitute a valid excuse. The use of any illegal substances is prohibited. 10. Adopted CDC guidelines & recommendations: Target dosing levels will be at or below 60 MME/day. Use of benzodiazepines** is not recommended.  Exceptions: There are only two exceptions to the rule of not receiving pain medications from  other Healthcare Providers. 1. Exception #1 (Emergencies): In the event of an emergency (i.e.: accident requiring emergency care), you are allowed to receive additional pain medication. However, you are responsible for: As soon as you are able, call our office [phone], at any time of the day or night, and leave a message stating your name, the date and nature of the emergency, and the name and dose of the medication prescribed. In the event that your call is answered by a member of our staff, make sure to document and save the date, time, and the name of the person that took your information.  2. Exception #2 (Planned Surgery): In the event that you are scheduled by another doctor or dentist to have any type of surgery or procedure, you are allowed (for a period no longer than 30 days), to receive additional pain medication, for the acute post-op pain. However, in this case, you are responsible for picking up a copy of our "Post-op Pain Management for Surgeons" handout, and giving it to your surgeon or dentist. This document is available at our office, and does not require an appointment to obtain it. Simply go to our office during business hours (Monday-Thursday from 8:00 AM to 4:00 PM) (Friday 8:00 AM to 12:00 Noon) or if you have a scheduled appointment with [place], prior to your surgery, and ask for it by name. In addition, you will need to provide [place] with your name, name of your surgeon, type of surgery, and date of procedure or surgery.  *Opioid medications include: morphine, codeine, oxycodone, oxymorphone, hydrocodone, hydromorphone, meperidine, tramadol, tapentadol, buprenorphine, fentanyl, methadone. **Benzodiazepine medications include: diazepam (Valium), alprazolam (Xanax), clonazepam (Klonopine), lorazepam (Ativan), clorazepate (Tranxene), chlordiazepoxide (Librium), estazolam (Prosom), oxazepam (Serax), temazepam (Restoril), triazolam (Halcion) (Last updated:  01/04/2018) ____________________________________________________________________________________________   BMI Assessment: Estimated body mass index is 36.49 kg/m as calculated from the following:   Height as of this encounter: '5\' 3"'  (1.6 m).   Weight as of this encounter: 206 lb (93.4 kg).  BMI interpretation table: BMI level Category Range association with higher incidence of chronic pain  <18 kg/m2 Underweight   18.5-24.9 kg/m2 Ideal body weight   25-29.9 kg/m2 Overweight Increased incidence by 20%  30-34.9 kg/m2 Obese (Class I) Increased incidence by 68%  35-39.9 kg/m2 Severe obesity (Class II) Increased incidence by 136%  >40 kg/m2 Extreme obesity (Class III) Increased incidence by 254%   Patient's current BMI Ideal Body weight  Body mass index is 36.49 kg/m. Ideal body weight: 52.4 kg (115 lb 8.3 oz) Adjusted ideal body weight: 68.8 kg (151 lb 11.4 oz)  BMI Readings from Last 4 Encounters:  05/16/18 36.49 kg/m  05/03/18 36.49 kg/m  03/14/18 36.14 kg/m  02/15/18 36.31 kg/m   Wt Readings from Last 4 Encounters:  05/16/18 206 lb (93.4 kg)  05/03/18 206 lb (93.4 kg)  03/14/18 204 lb (92.5 kg)  02/15/18 205 lb (93 kg)

## 2018-05-17 ENCOUNTER — Other Ambulatory Visit: Payer: Self-pay | Admitting: Pain Medicine

## 2018-05-21 LAB — TOXASSURE SELECT 13 (MW), URINE

## 2018-06-05 ENCOUNTER — Telehealth: Payer: Self-pay

## 2018-06-05 NOTE — Telephone Encounter (Signed)
Patient called stating that she only has 3 oxycodone 5 mg left and the pharmacy has misplaced July's Rx.  States she is in a lot of pain and has been taking ibuprofen which she states she is not supposed to be taking.    Patient states she spoke with Thad Ranger at July 10 appt and let her know that pharmacy had misplaced Rx.    Called to [facility] pharmacy to see if they have Rx.  They did fill the June Rx but no May Rx and also they see the hydrocodone 5-325 mg x 14 days for post procedural pain.   Will check PMP for dates.

## 2018-06-05 NOTE — Telephone Encounter (Signed)
Spoke back to patient to go over information that I have found.   1.  [facility] states they do not have the July 27 Rx for oxycodone 5 mg. Patient states that she turned this in to them.  Encouraged patient to continue looking for Rx.   2.  PMP reflects multiple early fills as well as fills of hydrocodone - apap 5 - 325 for post procedural pain.   3.  C[facility] states the dates of 07/08/18 and 08/07/18 were a  Results of previous fill dates and would be appropriate for 30 day period.    I told patient that [facility] states she is not going to write another Rx.  Patient very upset and states she does not want the Medicine but that she needs it.  Patient reminded that Crystal went over all of this at her last visit on July 10.   I will check with pharmacy once more to make sure they have not overlooked the Rx.   After speaking back with pharmacy, he states he doesn't have June, July Rx which have also not been filled as per PMP.  However, they have filled the May rx for oxycodone 5 mg as well as LYrica on 04/07/18 which were hard copies written on the same day.  I did reach out to patient via voicemail that possibly she still has the June, July Rx's and to please check with her husband and look in her car.  Also, through voicemail let her know that if she has such a difficult time she could always go to the ED or she could see Crystal tomorrow if she feels that she needs to but we would not be able to rewrite the Rx.

## 2018-06-05 NOTE — Telephone Encounter (Signed)
Noted  This is correct Please verify with Chip Boer because she was going to have use rewrite for another patient.I am just going by policy.

## 2018-06-05 NOTE — Telephone Encounter (Signed)
Pt called and is in a lot of pain Pharmacy misplaced RX, Hydrocodone 5mg , Can you call her

## 2018-06-13 DIAGNOSIS — G4733 Obstructive sleep apnea (adult) (pediatric): Secondary | ICD-10-CM | POA: Diagnosis not present

## 2018-06-18 ENCOUNTER — Ambulatory Visit: Payer: PPO | Attending: Pain Medicine | Admitting: Nurse Practitioner

## 2018-06-18 ENCOUNTER — Encounter: Payer: Self-pay | Admitting: Nurse Practitioner

## 2018-06-18 ENCOUNTER — Other Ambulatory Visit: Payer: Self-pay

## 2018-06-18 ENCOUNTER — Telehealth: Payer: Self-pay | Admitting: Pain Medicine

## 2018-06-18 ENCOUNTER — Ambulatory Visit: Payer: Self-pay | Admitting: Pain Medicine

## 2018-06-18 VITALS — BP 115/58 | HR 84 | Temp 98.0°F | Ht 63.0 in | Wt 203.0 lb

## 2018-06-18 DIAGNOSIS — M4814 Ankylosing hyperostosis [Forestier], thoracic region: Secondary | ICD-10-CM | POA: Diagnosis not present

## 2018-06-18 DIAGNOSIS — E785 Hyperlipidemia, unspecified: Secondary | ICD-10-CM | POA: Insufficient documentation

## 2018-06-18 DIAGNOSIS — Z79891 Long term (current) use of opiate analgesic: Secondary | ICD-10-CM | POA: Diagnosis not present

## 2018-06-18 DIAGNOSIS — M47817 Spondylosis without myelopathy or radiculopathy, lumbosacral region: Secondary | ICD-10-CM | POA: Insufficient documentation

## 2018-06-18 DIAGNOSIS — M160 Bilateral primary osteoarthritis of hip: Secondary | ICD-10-CM | POA: Insufficient documentation

## 2018-06-18 DIAGNOSIS — M4724 Other spondylosis with radiculopathy, thoracic region: Secondary | ICD-10-CM | POA: Diagnosis not present

## 2018-06-18 DIAGNOSIS — Z79899 Other long term (current) drug therapy: Secondary | ICD-10-CM | POA: Diagnosis not present

## 2018-06-18 DIAGNOSIS — M5136 Other intervertebral disc degeneration, lumbar region: Secondary | ICD-10-CM | POA: Diagnosis not present

## 2018-06-18 DIAGNOSIS — K6289 Other specified diseases of anus and rectum: Secondary | ICD-10-CM | POA: Insufficient documentation

## 2018-06-18 DIAGNOSIS — G894 Chronic pain syndrome: Secondary | ICD-10-CM | POA: Insufficient documentation

## 2018-06-18 DIAGNOSIS — Z9049 Acquired absence of other specified parts of digestive tract: Secondary | ICD-10-CM | POA: Insufficient documentation

## 2018-06-18 DIAGNOSIS — F419 Anxiety disorder, unspecified: Secondary | ICD-10-CM | POA: Insufficient documentation

## 2018-06-18 DIAGNOSIS — Z5181 Encounter for therapeutic drug level monitoring: Secondary | ICD-10-CM | POA: Insufficient documentation

## 2018-06-18 DIAGNOSIS — Z87891 Personal history of nicotine dependence: Secondary | ICD-10-CM | POA: Insufficient documentation

## 2018-06-18 DIAGNOSIS — G8929 Other chronic pain: Secondary | ICD-10-CM | POA: Diagnosis not present

## 2018-06-18 DIAGNOSIS — M533 Sacrococcygeal disorders, not elsewhere classified: Secondary | ICD-10-CM

## 2018-06-18 DIAGNOSIS — M797 Fibromyalgia: Secondary | ICD-10-CM | POA: Insufficient documentation

## 2018-06-18 MED ORDER — OXYCODONE HCL 5 MG PO TABS: 5.0000 mg | ORAL_TABLET | Freq: Two times a day (BID) | ORAL | 0 refills | Status: DC | PRN

## 2018-06-18 NOTE — Patient Instructions (Signed)
____________________________________________________________________________________________  Medication Rules  Applies to: All patients receiving prescriptions (written or electronic).  Pharmacy of record: Pharmacy where electronic prescriptions will be sent. If written prescriptions are taken to a different pharmacy, please inform the nursing staff. The pharmacy listed in the electronic medical record should be the one where you would like electronic prescriptions to be sent.  Prescription refills: Only during scheduled appointments. Applies to both, written and electronic prescriptions.  NOTE: The following applies primarily to controlled substances (Opioid* Pain Medications).   Patient's responsibilities: 1. Pain Pills: Bring all pain pills to every appointment (except for procedure appointments). 2. Pill Bottles: Bring pills in original pharmacy bottle. Always bring newest bottle. Bring bottle, even if empty. 3. Medication refills: You are responsible for knowing and keeping track of what medications you need refilled. The day before your appointment, write a list of all prescriptions that need to be refilled. Bring that list to your appointment and give it to the admitting nurse. Prescriptions will be written only during appointments. If you forget a medication, it will not be "Called in", "Faxed", or "electronically sent". You will need to get another appointment to get these prescribed. 4. Prescription Accuracy: You are responsible for carefully inspecting your prescriptions before leaving our office. Have the discharge nurse carefully go over each prescription with you, before taking them home. Make sure that your name is accurately spelled, that your address is correct. Check the name and dose of your medication to make sure it is accurate. Check the number of pills, and the written instructions to make sure they are clear and accurate. Make sure that you are given enough medication to last  until your next medication refill appointment. 5. Taking Medication: Take medication as prescribed. Never take more pills than instructed. Never take medication more frequently than prescribed. Taking less pills or less frequently is permitted and encouraged, when it comes to controlled substances (written prescriptions).  6. Inform other Doctors: Always inform, all of your healthcare providers, of all the medications you take. 7. Pain Medication from other Providers: You are not allowed to accept any additional pain medication from any other Doctor or Healthcare provider. There are two exceptions to this rule. (see below) In the event that you require additional pain medication, you are responsible for notifying us, as stated below. 8. Medication Agreement: You are responsible for carefully reading and following our Medication Agreement. This must be signed before receiving any prescriptions from our practice. Safely store a copy of your signed Agreement. Violations to the Agreement will result in no further prescriptions. (Additional copies of our Medication Agreement are available upon request.) 9. Laws, Rules, & Regulations: All patients are expected to follow all Federal and State Laws, Statutes, Rules, & Regulations. Ignorance of the Laws does not constitute a valid excuse. The use of any illegal substances is prohibited. 10. Adopted CDC guidelines & recommendations: Target dosing levels will be at or below 60 MME/day. Use of benzodiazepines** is not recommended.  Exceptions: There are only two exceptions to the rule of not receiving pain medications from other Healthcare Providers. 1. Exception #1 (Emergencies): In the event of an emergency (i.e.: accident requiring emergency care), you are allowed to receive additional pain medication. However, you are responsible for: As soon as you are able, call our office [phone], at any time of the day or night, and leave a message stating your name, the  date and nature of the emergency, and the name and dose of the medication   prescribed. In the event that your call is answered by a member of our staff, make sure to document and save the date, time, and the name of the person that took your information.  2. Exception #2 (Planned Surgery): In the event that you are scheduled by another doctor or dentist to have any type of surgery or procedure, you are allowed (for a period no longer than 30 days), to receive additional pain medication, for the acute post-op pain. However, in this case, you are responsible for picking up a copy of our "Post-op Pain Management for Surgeons" handout, and giving it to your surgeon or dentist. This document is available at our office, and does not require an appointment to obtain it. Simply go to our office during business hours (Monday-Thursday from 8:00 AM to 4:00 PM) (Friday 8:00 AM to 12:00 Noon) or if you have a scheduled appointment with us, prior to your surgery, and ask for it by name. In addition, you will need to provide us with your name, name of your surgeon, type of surgery, and date of procedure or surgery.  *Opioid medications include: morphine, codeine, oxycodone, oxymorphone, hydrocodone, hydromorphone, meperidine, tramadol, tapentadol, buprenorphine, fentanyl, methadone. **Benzodiazepine medications include: diazepam (Valium), alprazolam (Xanax), clonazepam (Klonopine), lorazepam (Ativan), clorazepate (Tranxene), chlordiazepoxide (Librium), estazolam (Prosom), oxazepam (Serax), temazepam (Restoril), triazolam (Halcion) (Last updated: 01/04/2018) ____________________________________________________________________________________________    

## 2018-06-18 NOTE — Telephone Encounter (Signed)
Patient is scheduled for med refill on 08-15-18 and procedure on 08-16-18, can Dr. Laban Emperor fill her scripts on procedure day so patient doesn't have to travel here 2 days in a row ? Please ask when he come back

## 2018-06-18 NOTE — Telephone Encounter (Signed)
Left message with patient that she should keep appointment scheduled like they are because Dr Laban Emperor does not prescribe medications on a procedure day, and she is scheduled with [facility] for med refill.

## 2018-06-18 NOTE — Progress Notes (Addendum)
Patient's Name: Jennifer Harmon  MRN: [id number]  Referring Provider: Glenda Chroman, MD  DOB: Jun 14, 1961  PCP: Glenda Chroman, MD  DOS: 06/18/2018  Note by: Vevelyn Francois NP  Service setting: Ambulatory outpatient  Specialty: Interventional Pain Management  Location: [facility] (AMB) Pain Management Facility    Patient type: Established    Primary Reason(s) for Visit: Encounter for prescription drug management & post-procedure evaluation of chronic illness with mild to moderate exacerbation(Level of risk: moderate) CC: Rectal Pain  HPI  Jennifer Harmon is a 57 y.o. year old, female patient, who comes today for a post-procedure evaluation and medication management. She has Dyslipidemia; History of gastroesophageal reflux (GERD); History of sleep apnea; History of depression; History of anxiety; Fibromyalgia; DDD (degenerative disc disease), lumbar; S/P partial knee replacement (Left); S/P total knee replacement (Right); Osteoarthritis of feet (Bilateral); History of diverticulosis; Former smoker; Chronic diarrhea; Chronic inflammatory arthritis; History of knee arthroplasty (Bilateral); Osteoarthritis of knee (Right); Symptomatic mammary hypertrophy; Chronic upper back pain (Primary Area of Pain) (Bilateral) (R>L); Chronic neck pain (Tertiary Area of Pain) (Bilateral) (R>L); Chronic shoulder pain; Chronic upper extremity pain (Bilateral) (R>L); Chronic pain syndrome; Long term current use of opiate analgesic; Disorder of skeletal system; Chronic sacroiliac joint pain (Bilateral) (R>L); Chronic low back pain  (Secondary Area of Pain) (Bilateral) (R>L); Chronic knee pain (Fourth Area of Pain) (Bilateral) (R>L); Long term prescription opiate use; NSAID long-term use; Opiate use; Pharmacologic therapy; Problems influencing health status; Neurogenic pain; Chronic musculoskeletal pain; Chronic recurrent lower extremity muscle spasms (Bilateral); Long term prescription benzodiazepine use; Thoracic radiculopathy due to  osteoarthritis of spine; Thoracic radiculitis (T6/T7) (Right); Chronic lower extremity pain (Bilateral) (R>L); Chronic lower extremity radicular pain (L5) (Right); DISH (diffuse idiopathic skeletal hyperostosis); 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 Her primarily concern today is the Rectal Pain  Pain Assessment: Location: Right, Left Buttocks Radiating: new pain that radiates down both legs to foot, unable to move toes at times Onset: More than a month ago Duration: Chronic pain Quality: Aching, Constant, Numbness, Discomfort Severity: 6 /10 (subjective, self-reported pain score)  Note: Reported level is compatible with observation. Clinically the patient looks like a 2/10 A 2/10 is viewed as "Mild to Moderate" and described as noticeable and distracting. Impossible to hide from other people. More frequent flare-ups. Still possible to adapt and function close to normal. It can be very annoying and may have occasional stronger flare-ups. With discipline, patients may get used to it and adapt.       When using our objective Pain Scale, levels between 6 and 10/10 are said to belong in an emergency room, as it progressively worsens from a 6/10, described as severely limiting, requiring emergency care not usually available at an outpatient pain management facility. At a 6/10 level, communication becomes difficult and requires great effort. Assistance to reach the emergency department may be required. Facial flushing and  profuse sweating along with potentially dangerous increases in heart rate and blood pressure will be evident. Effect on ADL: limits my daily Timing: Constant Modifying factors: medications , lay down and heat BP: (!) 115/58  HR: 84  Jennifer Harmon was  last seen on 06/05/2018 for a procedure. During today's appointment we reviewed Jennifer Harmon's post-procedure results, as well as her outpatient medication regimen. She admits that she's had some improvement from the right-sided lumbar facet radiofrequency. She admits that she thought this appointment was left sided radiofrequency ablation.he admits that her right hip pain is persistent.  Further details on both, my assessment(s), as well as the proposed treatment plan, please see below.  Controlled Substance Pharmacotherapy Assessment REMS (Risk Evaluation and Mitigation Strategy)  Analgesic:Oxycodone IR 5 mg twice daily MME/day:15 mg/day. No notes on file Pharmacokinetics: Liberation and absorption (onset of action): WNL Distribution (time to peak effect): WNL Metabolism and excretion (duration of action): WNL         Pharmacodynamics: Desired effects: Analgesia: Jennifer Harmon reports >50% benefit. Functional ability: Patient reports that medication allows her to accomplish basic ADLs Clinically meaningful improvement in function (CMIF): Sustained CMIF goals met Perceived effectiveness: Described as relatively effective, allowing for increase in activities of daily living (ADL) Undesirable effects: Side-effects or Adverse reactions: None reported Monitoring: [place] PMP: Online review of the past 24-monthperiod conducted. Compliant with practice rules and regulations Last UDS on record: Summary  Date Value Ref Range Status  05/16/2018 FINAL  Final    Comment:    ==================================================================== TOXASSURE SELECT 13 (MW) ==================================================================== Test                              Result       Flag       Units Drug Present and Declared for Prescription Verification   7-aminoclonazepam              552          EXPECTED   ng/mg creat    7-aminoclonazepam is an expected metabolite of clonazepam. Source    of clonazepam is a scheduled prescription medication.   Hydrocodone                    539          EXPECTED   ng/mg creat   Hydromorphone                  46           EXPECTED   ng/mg creat   Dihydrocodeine                 124          EXPECTED   ng/mg creat   Norhydrocodone                 681          EXPECTED   ng/mg creat    Sources of hydrocodone include scheduled prescription    medications. Hydromorphone, dihydrocodeine and norhydrocodone are    expected metabolites of hydrocodone. Hydromorphone and    dihydrocodeine are also available as scheduled prescription    medications.   Oxycodone                      65           EXPECTED   ng/mg creat   Oxymorphone                    84           EXPECTED   ng/mg creat    Sources of oxycodone include scheduled prescription medications.  Oxymorphone is an expected metabolite of oxycodone. Oxymorphone    is also available as a scheduled prescription medication. ==================================================================== Test                      Result    Flag   Units      Ref Range   Creatinine              122              mg/dL      >=20 ==================================================================== Declared Medications:  The flagging and interpretation on this report are based on the  following declared medications.  Unexpected results may arise from  inaccuracies in the declared medications.  **Note: The testing scope of this panel includes these medications:  Clonazepam  Hydrocodone (Hydrocodone-Acetaminophen)  Oxycodone (Oxy-IR)  **Note: The testing scope of this panel does not include following  reported medications:  Acetaminophen (Hydrocodone-Acetaminophen)   Citalopram  Cyclobenzaprine  Latanoprost  Magnesium Oxide  Pregabalin  Ranitidine ==================================================================== For clinical consultation, please call [phone]. ====================================================================    UDS interpretation: Compliant          Medication Assessment Form: Reviewed. Patient indicates being compliant with therapy Treatment compliance: Compliant Risk Assessment Profile: Aberrant behavior: See prior evaluations. None observed or detected today Comorbid factors increasing risk of overdose: See prior notes. No additional risks detected today Risk of substance use disorder (SUD): Low Opioid Risk Tool - 06/18/18 0839      Family History of Substance Abuse   Alcohol  Negative    Illegal Drugs  Negative    Rx Drugs  Negative      Personal History of Substance Abuse   Alcohol  Negative    Illegal Drugs  Negative    Rx Drugs  Negative      Total Score   Opioid Risk Tool Scoring  0    Opioid Risk Interpretation  Low Risk      ORT Scoring interpretation table:  Score <3 = Low Risk for SUD  Score between 4-7 = Moderate Risk for SUD  Score >8 = High Risk for Opioid Abuse   Risk Mitigation Strategies:  Patient Counseling: Covered Patient-Prescriber Agreement (PPA): Present and active  Notification to other healthcare providers: Done  Pharmacologic Plan: No change in therapy, at this time.             Post-Procedure Assessment  05/03/2018 Procedure: right lumbar facet radiofrequency ablation Pre-procedure pain score:  9/10 Post-procedure pain score: 0/10         Influential Factors: BMI: 35.96 kg/m Intra-procedural challenges: None observed.         Assessment challenges: None detected.              Reported side-effects: None.        Post-procedural adverse reactions or complications: None reported         Sedation: Please see nurses note. When no sedatives are used, the analgesic levels  obtained are directly associated to the effectiveness of the local anesthetics. However, when sedation is provided, the level of analgesia obtained during the initial 1 hour following the intervention, is believed to be the result of a combination of factors. These factors may include, but are not limited to: 1. The effectiveness of the local anesthetics used. 2. The effects of the analgesic(s) and/or anxiolytic(s) used. 3. The degree of discomfort experienced by the patient at the time of the procedure. 4. The patients ability and  reliability in recalling and recording the events. 5. The presence and influence of possible secondary gains and/or psychosocial factors. Reported result: Relief experienced during the 1st hour after the procedure: 100 % (Ultra-Short Term Relief)            Interpretative annotation: Clinically appropriate result. Analgesia during this period is likely to be Local Anesthetic and/or IV Sedative (Analgesic/Anxiolytic) related.          Effects of local anesthetic: The analgesic effects attained during this period are directly associated to the localized infiltration of local anesthetics and therefore cary significant diagnostic value as to the etiological location, or anatomical origin, of the pain. Expected duration of relief is directly dependent on the pharmacodynamics of the local anesthetic used. Long-acting (4-6 hours) anesthetics used.  Reported result: Relief during the next 4 to 6 hour after the procedure: 100 %(about 4 to 5 days) (Short-Term Relief)            Interpretative annotation: Clinically appropriate result. Analgesia during this period is likely to be Local Anesthetic-related.          Long-term benefit: Defined as the period of time past the expected duration of local anesthetics (1 hour for short-acting and 4-6 hours for long-acting). With the possible exception of prolonged sympathetic blockade from the local anesthetics, benefits during this period are  typically attributed to, or associated with, other factors such as analgesic sensory neuropraxia, antiinflammatory effects, or beneficial biochemical changes provided by agents other than the local anesthetics.  Reported result: Extended relief following procedure: 10 % (Long-Term Relief)            Interpretative annotation: Clinically possible results. Good relief. No permanent benefit expected. Inflammation plays a part in the etiology to the pain.          Current benefits: Defined as reported results that persistent at this point in time.   Analgesia: >50 %            Function: Ms. Dottavio reports improvement in function ROM: Ms. Jeffcoat reports improvement in ROM Interpretative annotation: Clinically significant results.    Adequate RF ablation.          Interpretation: Results would suggest adequate radiofrequency ablation.                  Plan:  Please see "Plan of Care" for details.                Laboratory Chemistry  Inflammation Markers (CRP: Acute Phase) (ESR: Chronic Phase) Lab Results  Component Value Date   CRP <0.8 09/06/2017   ESRSEDRATE 18 09/06/2017                         Rheumatology Markers Lab Results  Component Value Date   RF <14 07/03/2017                        Renal Function Markers Lab Results  Component Value Date   BUN 16 09/06/2017   CREATININE 0.67 09/06/2017   GFRAA >60 09/06/2017   GFRNONAA >60 09/06/2017                             Hepatic Function Markers Lab Results  Component Value Date   AST 26 09/06/2017   ALT 31 09/06/2017   ALBUMIN 4.8 09/06/2017   ALKPHOS 100 09/06/2017  Electrolytes Lab Results  Component Value Date   NA 140 09/06/2017   K 4.0 09/06/2017   CL 104 09/06/2017   CALCIUM 10.0 09/06/2017   MG 2.0 07/03/2017                        Neuropathy Markers Lab Results  Component Value Date   [id number] 416 09/06/2017                        Bone Pathology Markers No results found  for: [place], [id number], [id number], [id number], 25OHVITD1, 25OHVITD2, 25OHVITD3, TESTOFREE, TESTOSTERONE                       Coagulation Parameters Lab Results  Component Value Date   PLT 197 07/03/2017                        Cardiovascular Markers Lab Results  Component Value Date   CKTOTAL 130 07/03/2017   HGB 14.0 07/03/2017   HCT 43.3 07/03/2017                         CA Markers No results found for: CEA, CA125, LABCA2                      Note: Lab results reviewed.  Recent Diagnostic Imaging Results  DG C-Arm 1-60 Min-No Report Fluoroscopy was utilized by the requesting physician.  No radiographic  interpretation.   Complexity Note: Imaging results reviewed. Results shared with Ms. Szeliga, using Layman's terms.                         Meds   Current Outpatient Medications:  .  citalopram (CELEXA) 20 MG tablet, Take 20 mg by mouth daily.  , Disp: , Rfl:  .  clonazePAM (KLONOPIN) 1 MG tablet, Take 1 mg by mouth daily. , Disp: , Rfl:  .  cyclobenzaprine (FLEXERIL) 10 MG tablet, Take 1 tablet (10 mg total) by mouth 2 (two) times daily as needed for muscle spasms., Disp: 60 tablet, Rfl: 2 .  latanoprost (XALATAN) 0.005 % ophthalmic solution, Place 1 drop into both eyes daily., Disp: , Rfl:  .  LYRICA 100 MG capsule, Take 1 capsule (100 mg total) by mouth 2 (two) times daily., Disp: 60 capsule, Rfl: 2 .  Magnesium Oxide 500 MG CAPS, Take 1 capsule (500 mg total) by mouth 2 (two) times daily at 8 am and 10 pm., Disp: 60 capsule, Rfl: 2 .  [START ON 08/07/2018] oxyCODONE (OXY IR/ROXICODONE) 5 MG immediate release tablet, Take 1 tablet (5 mg total) by mouth 2 (two) times daily as needed for severe pain., Disp: 60 tablet, Rfl: 0 .  [START ON 07/08/2018] oxyCODONE (OXY IR/ROXICODONE) 5 MG immediate release tablet, Take 1 tablet (5 mg total) by mouth 2 (two) times daily as needed for severe pain., Disp: 60 tablet, Rfl: 0 .  ranitidine (ZANTAC) 150 MG tablet, Take 150 mg by mouth  daily., Disp: , Rfl:  .  ranitidine (ZANTAC) 300 MG tablet, , Disp: , Rfl:  .  oxyCODONE (OXY IR/ROXICODONE) 5 MG immediate release tablet, Take 1 tablet (5 mg total) by mouth 2 (two) times daily as needed for severe pain., Disp: 60 tablet, Rfl: 0  ROS  Constitutional: Denies any fever or chills Gastrointestinal: No reported hemesis, hematochezia,  vomiting, or acute GI distress Musculoskeletal: Denies any acute onset joint swelling, redness, loss of ROM, or weakness Neurological: No reported episodes of acute onset apraxia, aphasia, dysarthria, agnosia, amnesia, paralysis, loss of coordination, or loss of consciousness  Allergies  Ms. Esselman is allergic to asa [aspirin]; bupropion; and tape.  [place]  Drug: Ms. Quintin  reports that she does not use drugs. Alcohol:  reports that she does not drink alcohol. Tobacco:  reports that she quit smoking about 2 years ago. Her smoking use included pipe. She has a 10.00 pack-year smoking history. She uses smokeless tobacco. Medical:  has a past medical history of Anxiety, Chronic chest pain, Depression, DJD (degenerative joint disease), Fibromyalgia, GERD (gastroesophageal reflux disease), and Hypercholesterolemia. Surgical: Ms. Riemann  has a past surgical history that includes Cholecystectomy; Cesarean section; and Back surgery (2008). Family: family history includes Arthritis in her father; Hypertension in her mother.  Constitutional Exam  General appearance: Well nourished, well developed, and well hydrated. In no apparent acute distress Vitals:   06/18/18 0827  BP: (!) 115/58  Pulse: 84  Temp: 98 F (36.7 C)  SpO2: 94%  Weight: 203 lb (92.1 kg)  Height: '5\' 3"'$  (1.6 m)   BMI Assessment: Estimated body mass index is 35.96 kg/m as calculated from the following:   Height as of this encounter: '5\' 3"'$  (1.6 m).   Weight as of this encounter: 203 lb (92.1 kg).  BMI interpretation table: BMI level Category Range association with higher incidence  of chronic pain  <18 kg/m2 Underweight   18.5-24.9 kg/m2 Ideal body weight   25-29.9 kg/m2 Overweight Increased incidence by 20%  30-34.9 kg/m2 Obese (Class I) Increased incidence by 68%  35-39.9 kg/m2 Severe obesity (Class II) Increased incidence by 136%  >40 kg/m2 Extreme obesity (Class III) Increased incidence by 254%   Patient's current BMI Ideal Body weight  Body mass index is 35.96 kg/m. Ideal body weight: 52.4 kg (115 lb 8.3 oz) Adjusted ideal body weight: 68.3 kg (150 lb 8.2 oz)   BMI Readings from Last 4 Encounters:  06/18/18 35.96 kg/m  05/16/18 36.49 kg/m  05/03/18 36.49 kg/m  03/14/18 36.14 kg/m   Wt Readings from Last 4 Encounters:  06/18/18 203 lb (92.1 kg)  05/16/18 206 lb (93.4 kg)  05/03/18 206 lb (93.4 kg)  03/14/18 204 lb (92.5 kg)  Psych/Mental status: Alert, oriented x 3 (person, place, & time)       Eyes: PERLA Respiratory: No evidence of acute respiratory distress  Cervical Spine Area Exam  Skin & Axial Inspection: No masses, redness, edema, swelling, or associated skin lesions Alignment: Symmetrical Functional ROM: Unrestricted ROM      Stability: No instability detected Muscle Tone/Strength: Functionally intact. No obvious neuro-muscular anomalies detected. Sensory (Neurological): Unimpaired Palpation: No palpable anomalies              Upper Extremity (UE) Exam    Side: Right upper extremity  Side: Left upper extremity  Skin & Extremity Inspection: Skin color, temperature, and hair growth are WNL. No peripheral edema or cyanosis. No masses, redness, swelling, asymmetry, or associated skin lesions. No contractures.  Skin & Extremity Inspection: Skin color, temperature, and hair growth are WNL. No peripheral edema or cyanosis. No masses, redness, swelling, asymmetry, or associated skin lesions. No contractures.  Functional ROM: Unrestricted ROM          Functional ROM: Unrestricted ROM          Muscle Tone/Strength: Functionally intact. No obvious  neuro-muscular  anomalies detected.  Muscle Tone/Strength: Functionally intact. No obvious neuro-muscular anomalies detected.  Sensory (Neurological): Unimpaired          Sensory (Neurological): Unimpaired          Palpation: No palpable anomalies              Palpation: No palpable anomalies              Provocative Test(s):  Phalen's test: deferred Tinel's test: deferred Apley's scratch test (touch opposite shoulder):  Action 1 (Across chest): deferred Action 2 (Overhead): deferred Action 3 (LB reach): deferred   Provocative Test(s):  Phalen's test: deferred Tinel's test: deferred Apley's scratch test (touch opposite shoulder):  Action 1 (Across chest): deferred Action 2 (Overhead): deferred Action 3 (LB reach): deferred    Thoracic Spine Area Exam  Skin & Axial Inspection: No masses, redness, or swelling Alignment: Symmetrical Functional ROM: Unrestricted ROM Stability: No instability detected Muscle Tone/Strength: Functionally intact. No obvious neuro-muscular anomalies detected. Sensory (Neurological): Unimpaired Muscle strength & Tone: No palpable anomalies  Lumbar Spine Area Exam  Skin & Axial Inspection: No masses, redness, or swelling Alignment: Symmetrical Functional ROM: Unrestricted ROM       Stability: No instability detected Muscle Tone/Strength: Functionally intact. No obvious neuro-muscular anomalies detected. Sensory (Neurological): Unimpaired Palpation: Complains of area being tender to palpation       Provocative Tests: Hyperextension/rotation test: Positive bilaterally for facet joint pain. Lumbar quadrant test (Kemp's test): deferred today       Lateral bending test: deferred today       Patrick's Maneuver: deferred today                   FABER test: deferred today                   S-I anterior distraction/compression test: deferred today         S-I lateral compression test: deferred today         S-I Thigh-thrust test: deferred today         S-I  Gaenslen's test: deferred today          Gait & Posture Assessment  Ambulation: Unassisted Gait: Antalgic Posture: WNL   Lower Extremity Exam    Side: Right lower extremity  Side: Left lower extremity  Stability: No instability observed          Stability: No instability observed          Skin & Extremity Inspection: Skin color, temperature, and hair growth are WNL. No peripheral edema or cyanosis. No masses, redness, swelling, asymmetry, or associated skin lesions. No contractures.  Skin & Extremity Inspection: Skin color, temperature, and hair growth are WNL. No peripheral edema or cyanosis. No masses, redness, swelling, asymmetry, or associated skin lesions. No contractures.  Functional ROM: Unrestricted ROM                  Functional ROM: Unrestricted ROM                  Muscle Tone/Strength: Functionally intact. No obvious neuro-muscular anomalies detected.  Muscle Tone/Strength: Functionally intact. No obvious neuro-muscular anomalies detected.  Sensory (Neurological): Unimpaired  Sensory (Neurological): Unimpaired  Palpation: No palpable anomalies  Palpation: No palpable anomalies   Assessment  Primary Diagnosis & Pertinent Problem List: The primary encounter diagnosis was Spondylosis without myelopathy or radiculopathy, lumbosacral region. Diagnoses of Osteoarthritis of hip (Bilateral) (R>L), Chronic sacroiliac joint pain (Bilateral) (R>L), Fibromyalgia, and Chronic  pain syndrome were also pertinent to this visit.  Status Diagnosis  Controlled Controlled Controlled 1. Spondylosis without myelopathy or radiculopathy, lumbosacral region   2. Osteoarthritis of hip (Bilateral) (R>L)   3. Chronic sacroiliac joint pain (Bilateral) (R>L)   4. Fibromyalgia   5. Chronic pain syndrome     Problems updated and reviewed during this visit: No problems updated. Plan of Care  Pharmacotherapy (Medications Ordered): Meds ordered this encounter  Medications  . oxyCODONE (OXY  IR/ROXICODONE) 5 MG immediate release tablet    Sig: Take 1 tablet (5 mg total) by mouth 2 (two) times daily as needed for severe pain.    Dispense:  60 tablet    Refill:  0    Do not place this medication, or any other prescription from our practice, on "Automatic Refill". Patient may have prescription filled one day early if pharmacy is closed on scheduled refill date. Do not fill until: 06/18/2018 To last until: 07/18/2018    Order Specific Question:   Supervising Provider    Answer:   Milinda Pointer [phone]   New Prescriptions   No medications on file   Medications administered today: Danita Proud. [place] had no medications administered during this visit. Lab-work, procedure(s), and/or referral(s): Orders Placed This Encounter  Procedures  . Radiofrequency,Lumbar   Imaging and/or referral(s): None  Interventional management options: Planned, scheduled, and/or pending: Diagnostic left-sided lumbar facet radiofrequency ablation   Considering: Diagnostic Trigger point injections Diagnostic bilateral LESI Diagnostic bilateral lumbar facet nerve block #3 Possiblebilateral lumbar facet RFA Possiblebilateral Racz(Epidural Neurolysis) Diagnostic thoracic facet nerve block Possible thoracic facet RFA Diagnostic bilateral CESI Diagnosticbilateral cervical facet nerve block Possiblebilateral cervical facet RFA Diagnostic right shoulder intra-articular injection Diagnostic right suprascapular nerve block Possibleright suprascapular RFA Diagnostic bilateral genicular nerve block Possible bilateral genicular RFA   Palliative PRN treatment(s): None at this time    Provider-requested follow-up: Return for Appointment As Scheduled, in addition, Procedure(w/Sedation), w/ Dr. Dossie Arbour, Left Lumbar RFA.  Future Appointments  Date Time Provider [place]  08/15/2018  8:30 AM Vevelyn Francois, NP [facility] None  08/16/2018 10:30 AM Milinda Pointer, MD [facility] None  08/24/2018  9:15 AM Bo Merino, MD [facility] None   Primary Care Physician: Glenda Chroman, MD Location: [facility] Note by: Vevelyn Francois NP Date: 06/18/2018; Time: 3:27 PM  Pain Score Disclaimer: We use the NRS-11 scale. This is a self-reported, subjective measurement of pain severity with only modest accuracy. It is used primarily to identify changes within a particular patient. It must be understood that outpatient pain scales are significantly less accurate that those used for research, where they can be applied under ideal controlled circumstances with minimal exposure to variables. In reality, the score is likely to be a combination of pain intensity and pain affect, where pain affect describes the degree of emotional arousal or changes in action readiness caused by the sensory experience of pain. Factors such as social and work situation, setting, emotional state, anxiety levels, expectation, and prior pain experience may influence pain perception and show large inter-individual differences that may also be affected by time variables.  Patient instructions provided during this appointment: Patient Instructions  ____________________________________________________________________________________________  Medication Rules  Applies to: All patients receiving prescriptions (written or electronic).  Pharmacy of record: Pharmacy where electronic prescriptions will be sent. If written prescriptions are taken to a different pharmacy, please inform the nursing staff. The pharmacy listed in the electronic medical record should be the one where  you would like electronic prescriptions to be sent.  Prescription refills: Only during scheduled appointments. Applies to both, written and electronic prescriptions.  NOTE: The following applies primarily to controlled substances (Opioid* Pain Medications).   Patient's  responsibilities: 1. Pain Pills: Bring all pain pills to every appointment (except for procedure appointments). 2. Pill Bottles: Bring pills in original pharmacy bottle. Always bring newest bottle. Bring bottle, even if empty. 3. Medication refills: You are responsible for knowing and keeping track of what medications you need refilled. The day before your appointment, write a list of all prescriptions that need to be refilled. Bring that list to your appointment and give it to the admitting nurse. Prescriptions will be written only during appointments. If you forget a medication, it will not be "Called in", "Faxed", or "electronically sent". You will need to get another appointment to get these prescribed. 4. Prescription Accuracy: You are responsible for carefully inspecting your prescriptions before leaving our office. Have the discharge nurse carefully go over each prescription with you, before taking them home. Make sure that your name is accurately spelled, that your address is correct. Check the name and dose of your medication to make sure it is accurate. Check the number of pills, and the written instructions to make sure they are clear and accurate. Make sure that you are given enough medication to last until your next medication refill appointment. 5. Taking Medication: Take medication as prescribed. Never take more pills than instructed. Never take medication more frequently than prescribed. Taking less pills or less frequently is permitted and encouraged, when it comes to controlled substances (written prescriptions).  6. Inform other Doctors: Always inform, all of your healthcare providers, of all the medications you take. 7. Pain Medication from other Providers: You are not allowed to accept any additional pain medication from any other Doctor or Healthcare provider. There are two exceptions to this rule. (see below) In the event that you require additional pain medication, you are responsible  for notifying us, as stated below. 8. Medication Agreement: You are responsible for carefully reading and following our Medication Agreement. This must be signed before receiving any prescriptions from our practice. Safely store a copy of your signed Agreement. Violations to the Agreement will result in no further prescriptions. (Additional copies of our Medication Agreement are available upon request.) 9. Laws, Rules, & Regulations: All patients are expected to follow all Federal and [employer], Rules, [employer]. Ignorance of the Laws does not constitute a valid excuse. The use of any illegal substances is prohibited. 10. Adopted CDC guidelines & recommendations: Target dosing levels will be at or below 60 MME/day. Use of benzodiazepines** is not recommended.  Exceptions: There are only two exceptions to the rule of not receiving pain medications from other Healthcare Providers. 1. Exception #1 (Emergencies): In the event of an emergency (i.e.: accident requiring emergency care), you are allowed to receive additional pain medication. However, you are responsible for: As soon as you are able, call our office [phone], at any time of the day or night, and leave a message stating your name, the date and nature of the emergency, and the name and dose of the medication prescribed. In the event that your call is answered by a member of our staff, make sure to document and save the date, time, and the name of the person that took your information.  2. Exception #2 (Planned Surgery): In the event that you are scheduled by another doctor or dentist  to have any type of surgery or procedure, you are allowed (for a period no longer than 30 days), to receive additional pain medication, for the acute post-op pain. However, in this case, you are responsible for picking up a copy of our "Post-op Pain Management for Surgeons" handout, and giving it to your surgeon or dentist. This document is available at  our office, and does not require an appointment to obtain it. Simply go to our office during business hours (Monday-Thursday from 8:00 AM to 4:00 PM) (Friday 8:00 AM to 12:00 Noon) or if you have a scheduled appointment with [place], prior to your surgery, and ask for it by name. In addition, you will need to provide [place] with your name, name of your surgeon, type of surgery, and date of procedure or surgery.  *Opioid medications include: morphine, codeine, oxycodone, oxymorphone, hydrocodone, hydromorphone, meperidine, tramadol, tapentadol, buprenorphine, fentanyl, methadone. **Benzodiazepine medications include: diazepam (Valium), alprazolam (Xanax), clonazepam (Klonopine), lorazepam (Ativan), clorazepate (Tranxene), chlordiazepoxide (Librium), estazolam (Prosom), oxazepam (Serax), temazepam (Restoril), triazolam (Halcion) (Last updated: 01/04/2018) ____________________________________________________________________________________________

## 2018-07-14 DIAGNOSIS — G4733 Obstructive sleep apnea (adult) (pediatric): Secondary | ICD-10-CM | POA: Diagnosis not present

## 2018-08-01 DIAGNOSIS — R5383 Other fatigue: Secondary | ICD-10-CM | POA: Diagnosis not present

## 2018-08-01 DIAGNOSIS — Z23 Encounter for immunization: Secondary | ICD-10-CM | POA: Diagnosis not present

## 2018-08-01 DIAGNOSIS — E785 Hyperlipidemia, unspecified: Secondary | ICD-10-CM | POA: Diagnosis not present

## 2018-08-01 DIAGNOSIS — F329 Major depressive disorder, single episode, unspecified: Secondary | ICD-10-CM | POA: Diagnosis not present

## 2018-08-01 DIAGNOSIS — Z299 Encounter for prophylactic measures, unspecified: Secondary | ICD-10-CM | POA: Diagnosis not present

## 2018-08-01 DIAGNOSIS — Z6834 Body mass index (BMI) 34.0-34.9, adult: Secondary | ICD-10-CM | POA: Diagnosis not present

## 2018-08-01 DIAGNOSIS — Z0000 Encounter for general adult medical examination without abnormal findings: Secondary | ICD-10-CM | POA: Diagnosis not present

## 2018-08-01 DIAGNOSIS — I10 Essential (primary) hypertension: Secondary | ICD-10-CM | POA: Diagnosis not present

## 2018-08-01 DIAGNOSIS — F419 Anxiety disorder, unspecified: Secondary | ICD-10-CM | POA: Diagnosis not present

## 2018-08-01 DIAGNOSIS — Z1211 Encounter for screening for malignant neoplasm of colon: Secondary | ICD-10-CM | POA: Diagnosis not present

## 2018-08-01 DIAGNOSIS — Z1331 Encounter for screening for depression: Secondary | ICD-10-CM | POA: Diagnosis not present

## 2018-08-01 DIAGNOSIS — Z79899 Other long term (current) drug therapy: Secondary | ICD-10-CM | POA: Diagnosis not present

## 2018-08-01 DIAGNOSIS — Z7189 Other specified counseling: Secondary | ICD-10-CM | POA: Diagnosis not present

## 2018-08-01 DIAGNOSIS — Z1339 Encounter for screening examination for other mental health and behavioral disorders: Secondary | ICD-10-CM | POA: Diagnosis not present

## 2018-08-13 ENCOUNTER — Telehealth: Payer: Self-pay

## 2018-08-13 DIAGNOSIS — G4733 Obstructive sleep apnea (adult) (pediatric): Secondary | ICD-10-CM | POA: Diagnosis not present

## 2018-08-13 NOTE — Telephone Encounter (Signed)
Pt called and has appt with Crystal K on 08/15/2018 For Meds and procedure on 10/10 with Dr Laban Emperor. Can Dr Damita Lack write her scrips so she doesn't have to come 2 day's in a row. She lives in [place].

## 2018-08-13 NOTE — Telephone Encounter (Signed)
Chip Boer says just Ask Dr Laban Emperor to see if will agree to this.

## 2018-08-13 NOTE — Telephone Encounter (Signed)
Have left note with Dr Laban Emperor.

## 2018-08-13 NOTE — Telephone Encounter (Signed)
Called patient to let her know that we can not combine medication management with procedure appt. Per Dr Laban Emperor.  Patient medication date is 09/06/18, I ask her if she would like to push her medication management back and possibly we could do her f/up along with medication management with Crystal.  Patient is agreeable to this and I will ask secretarial staff to please schedule this.

## 2018-08-15 ENCOUNTER — Encounter: Payer: Self-pay | Admitting: Nurse Practitioner

## 2018-08-16 ENCOUNTER — Ambulatory Visit (HOSPITAL_BASED_OUTPATIENT_CLINIC_OR_DEPARTMENT_OTHER): Payer: PPO | Admitting: Pain Medicine

## 2018-08-16 ENCOUNTER — Other Ambulatory Visit: Payer: Self-pay

## 2018-08-16 ENCOUNTER — Encounter: Payer: Self-pay | Admitting: Pain Medicine

## 2018-08-16 ENCOUNTER — Ambulatory Visit
Admission: RE | Admit: 2018-08-16 | Discharge: 2018-08-16 | Disposition: A | Discharge status: Home / Self Care | Payer: PPO | Source: Ambulatory Visit | Attending: Pain Medicine | Admitting: Pain Medicine

## 2018-08-16 ENCOUNTER — Telehealth: Payer: Self-pay

## 2018-08-16 VITALS — BP 113/68 | HR 94 | Temp 98.1°F | Resp 20 | Ht 63.0 in | Wt 201.0 lb

## 2018-08-16 DIAGNOSIS — M47816 Spondylosis without myelopathy or radiculopathy, lumbar region: Secondary | ICD-10-CM | POA: Diagnosis not present

## 2018-08-16 DIAGNOSIS — G8929 Other chronic pain: Secondary | ICD-10-CM | POA: Diagnosis not present

## 2018-08-16 DIAGNOSIS — M961 Postlaminectomy syndrome, not elsewhere classified: Secondary | ICD-10-CM | POA: Insufficient documentation

## 2018-08-16 DIAGNOSIS — M47897 Other spondylosis, lumbosacral region: Secondary | ICD-10-CM | POA: Diagnosis not present

## 2018-08-16 DIAGNOSIS — M5441 Lumbago with sciatica, right side: Secondary | ICD-10-CM

## 2018-08-16 DIAGNOSIS — M5136 Other intervertebral disc degeneration, lumbar region: Secondary | ICD-10-CM

## 2018-08-16 DIAGNOSIS — Z9049 Acquired absence of other specified parts of digestive tract: Secondary | ICD-10-CM | POA: Diagnosis not present

## 2018-08-16 DIAGNOSIS — Z79899 Other long term (current) drug therapy: Secondary | ICD-10-CM | POA: Diagnosis not present

## 2018-08-16 DIAGNOSIS — M5442 Lumbago with sciatica, left side: Secondary | ICD-10-CM | POA: Insufficient documentation

## 2018-08-16 DIAGNOSIS — Z79891 Long term (current) use of opiate analgesic: Secondary | ICD-10-CM | POA: Insufficient documentation

## 2018-08-16 DIAGNOSIS — G8918 Other acute postprocedural pain: Secondary | ICD-10-CM

## 2018-08-16 DIAGNOSIS — M4810 Ankylosing hyperostosis [Forestier], site unspecified: Secondary | ICD-10-CM | POA: Insufficient documentation

## 2018-08-16 DIAGNOSIS — M47817 Spondylosis without myelopathy or radiculopathy, lumbosacral region: Secondary | ICD-10-CM

## 2018-08-16 MED ORDER — HYDROCODONE-ACETAMINOPHEN 5-325 MG PO TABS: 1.0000 | ORAL_TABLET | Freq: Three times a day (TID) | ORAL | 0 refills | Status: AC | PRN

## 2018-08-16 MED ORDER — LACTATED RINGERS IV SOLN
1000.0000 mL | Freq: Once | INTRAVENOUS | Status: AC
  Administered 2018-08-16: 1000 mL via INTRAVENOUS

## 2018-08-16 MED ORDER — FENTANYL CITRATE (PF) 100 MCG/2ML IJ SOLN
INTRAMUSCULAR | Status: AC
  Filled 2018-08-16: qty 2

## 2018-08-16 MED ORDER — LIDOCAINE HCL 2 % IJ SOLN
INTRAMUSCULAR | Status: AC
  Filled 2018-08-16: qty 20

## 2018-08-16 MED ORDER — ROPIVACAINE HCL 2 MG/ML IJ SOLN
INTRAMUSCULAR | Status: AC
  Filled 2018-08-16: qty 10

## 2018-08-16 MED ORDER — MIDAZOLAM HCL 5 MG/5ML IJ SOLN
1.0000 mg | INTRAMUSCULAR | Status: DC | PRN
  Administered 2018-08-16: 1 mg via INTRAVENOUS

## 2018-08-16 MED ORDER — LIDOCAINE HCL 2 % IJ SOLN
20.0000 mL | Freq: Once | INTRAMUSCULAR | Status: AC
  Administered 2018-08-16: 400 mg

## 2018-08-16 MED ORDER — MIDAZOLAM HCL 5 MG/5ML IJ SOLN
INTRAMUSCULAR | Status: AC
  Filled 2018-08-16: qty 5

## 2018-08-16 MED ORDER — ROPIVACAINE HCL 2 MG/ML IJ SOLN
9.0000 mL | Freq: Once | INTRAMUSCULAR | Status: AC
  Administered 2018-08-16: 9 mL via PERINEURAL

## 2018-08-16 MED ORDER — TRIAMCINOLONE ACETONIDE 40 MG/ML IJ SUSP
40.0000 mg | Freq: Once | INTRAMUSCULAR | Status: AC
  Administered 2018-08-16: 40 mg

## 2018-08-16 MED ORDER — FENTANYL CITRATE (PF) 100 MCG/2ML IJ SOLN
25.0000 ug | INTRAMUSCULAR | Status: DC | PRN
  Administered 2018-08-16: 100 ug via INTRAVENOUS

## 2018-08-16 MED ORDER — TRIAMCINOLONE ACETONIDE 40 MG/ML IJ SUSP
INTRAMUSCULAR | Status: AC
  Filled 2018-08-16: qty 1

## 2018-08-16 NOTE — Progress Notes (Signed)
Safety precautions to be maintained throughout the outpatient stay will include: orient to surroundings, keep bed in low position, maintain call bell within reach at all times, provide assistance with transfer out of bed and ambulation.  

## 2018-08-16 NOTE — Patient Instructions (Signed)

## 2018-08-16 NOTE — Progress Notes (Addendum)
Patient's Name: Jennifer Harmon  MRN: [id number]  Referring Provider: Barbette Merino, NP  DOB: 26-Dec-1960  PCP: Ignatius Specking, MD  DOS: 08/16/2018  Note by: Oswaldo Done, MD  Service setting: Ambulatory outpatient  Specialty: Interventional Pain Management  Patient type: Established  Location: [facility] (AMB) Pain Management Facility  Visit type: Interventional Procedure   Primary Reason for Visit: Interventional Pain Management Treatment. CC: Procedure  Procedure:          Anesthesia, Analgesia, Anxiolysis:  Type: Thermal Lumbar Facet, Medial Branch Radiofrequency Ablation/Neurotomy  #1  Primary Purpose: Therapeutic Region: Posterolateral Lumbosacral Spine Level: L2, L3, L4, L5, & S1 Medial Branch Level(s). These levels will denervate the L3-4, L4-5, and the L5-S1 lumbar facet joints. Laterality: Left  Type: Moderate (Conscious) Sedation combined with Local Anesthesia Indication(s): Analgesia and Anxiety Route: Intravenous (IV) IV Access: Secured Sedation: Meaningful verbal contact was maintained at all times during the procedure  Local Anesthetic: Lidocaine 1-2%  Position: Prone   Indications: 1. Spondylosis without myelopathy or radiculopathy, lumbosacral region   2. Lumbar facet syndrome (Bilateral) (R>L)   3. DDD (degenerative disc disease), lumbar   4. DISH (diffuse idiopathic skeletal hyperostosis)   5. Chronic low back pain  (Secondary Area of Pain) (Bilateral) (R>L)   6. Failed back surgical syndrome    Jennifer Harmon has been dealing with the above chronic pain for longer than three months and has either failed to respond, was unable to tolerate, or simply did not get enough benefit from other more conservative therapies including, but not limited to: 1. Over-the-counter medications 2. Anti-inflammatory medications 3. Muscle relaxants 4. Membrane stabilizers 5. Opioids 6. Physical therapy and/or chiropractic manipulation 7. Modalities (Heat, ice, etc.) 8. Invasive  techniques such as nerve blocks. Jennifer Harmon has attained more than 50% relief of the pain from a series of diagnostic injections conducted in separate occasions.  Pain Score: Pre-procedure: 7 /10 Post-procedure: 1 /10  Pre-op Assessment:  Jennifer Harmon is a 57 y.o. (year old), female patient, seen today for interventional treatment. She  has a past surgical history that includes Cholecystectomy; Cesarean section; and Back surgery (2008). [name] has a current medication list which includes the following prescription(s): citalopram, clonazepam, latanoprost, oxycodone, ranitidine, ranitidine, rosuvastatin, cyclobenzaprine, hydrocodone-acetaminophen, hydrocodone-acetaminophen, lyrica, magnesium oxide, oxycodone, and oxycodone, and the following Facility-Administered Medications: fentanyl and midazolam. Her primarily concern today is the Procedure  Initial Vital Signs:  Pulse/HCG Rate: 86  Temp: 97.9 F (36.6 C) Resp: 18 BP: 123/73 SpO2: 98 %  BMI: Estimated body mass index is 35.61 kg/m as calculated from the following:   Height as of this encounter: 5\' 3"  (1.6 m).   Weight as of this encounter: 201 lb (91.2 kg).  Risk Assessment: Allergies: Reviewed. She is allergic to asa [aspirin]; bupropion; and tape.  Allergy Precautions: None required Coagulopathies: Reviewed. None identified.  Blood-thinner therapy: None at this time Active Infection(s): Reviewed. None identified. Jennifer Harmon is afebrile  Site Confirmation: Jennifer Harmon was asked to confirm the procedure and laterality before marking the site Procedure checklist: Completed Consent: Before the procedure and under the influence of no sedative(s), amnesic(s), or anxiolytics, the patient was informed of the treatment options, risks and possible complications. To fulfill our ethical and legal obligations, as recommended by the American Medical Association's Code of Ethics, I have informed the patient of my clinical impression; the  nature and purpose of the treatment or procedure; the risks, benefits, and possible complications of the intervention; the alternatives,  including doing nothing; the risk(s) and benefit(s) of the alternative treatment(s) or procedure(s); and the risk(s) and benefit(s) of doing nothing. The patient was provided information about the general risks and possible complications associated with the procedure. These may include, but are not limited to: failure to achieve desired goals, infection, bleeding, organ or nerve damage, allergic reactions, paralysis, and death. In addition, the patient was informed of those risks and complications associated to Spine-related procedures, such as failure to decrease pain; infection (i.e.: Meningitis, epidural or intraspinal abscess); bleeding (i.e.: epidural hematoma, subarachnoid hemorrhage, or any other type of intraspinal or peri-dural bleeding); organ or nerve damage (i.e.: Any type of peripheral nerve, nerve root, or spinal cord injury) with subsequent damage to sensory, motor, and/or autonomic systems, resulting in permanent pain, numbness, and/or weakness of one or several areas of the body; allergic reactions; (i.e.: anaphylactic reaction); and/or death. Furthermore, the patient was informed of those risks and complications associated with the medications. These include, but are not limited to: allergic reactions (i.e.: anaphylactic or anaphylactoid reaction(s)); adrenal axis suppression; blood sugar elevation that in diabetics may result in ketoacidosis or comma; water retention that in patients with history of congestive heart failure may result in shortness of breath, pulmonary edema, and decompensation with resultant heart failure; weight gain; swelling or edema; medication-induced neural toxicity; particulate matter embolism and blood vessel occlusion with resultant organ, and/or nervous system infarction; and/or aseptic necrosis of one or more joints. Finally, the  patient was informed that Medicine is not an exact science; therefore, there is also the possibility of unforeseen or unpredictable risks and/or possible complications that may result in a catastrophic outcome. The patient indicated having understood very clearly. We have given the patient no guarantees and we have made no promises. Enough time was given to the patient to ask questions, all of which were answered to the patient's satisfaction. Ms. Molden has indicated that she wanted to continue with the procedure. Attestation: I, the ordering provider, attest that I have discussed with the patient the benefits, risks, side-effects, alternatives, likelihood of achieving goals, and potential problems during recovery for the procedure that I have provided informed consent. Date  Time: 08/16/2018 10:59 AM  Pre-Procedure Preparation:  Monitoring: As per clinic protocol. Respiration, ETCO2, SpO2, BP, heart rate and rhythm monitor placed and checked for adequate function Safety Precautions: Patient was assessed for positional comfort and pressure points before starting the procedure. Time-out: I initiated and conducted the "Time-out" before starting the procedure, as per protocol. The patient was asked to participate by confirming the accuracy of the "Time Out" information. Verification of the correct person, site, and procedure were performed and confirmed by me, the nursing staff, and the patient. "Time-out" conducted as per Joint Commission's Universal Protocol (UP.01.01.01). Time: 1201  Description of Procedure:          Laterality: Left Levels:  L2, L3, L4, L5, & S1 Medial Branch Level(s), at the L3-4, L4-5, and the L5-S1 lumbar facet joints. Area Prepped: Lumbosacral Prepping solution: ChloraPrep (2% chlorhexidine gluconate and 70% isopropyl alcohol) Safety Precautions: Aspiration looking for blood return was conducted prior to all injections. At no point did we inject any substances, as a needle  was being advanced. Before injecting, the patient was told to immediately notify me if she was experiencing any new onset of "ringing in the ears, or metallic taste in the mouth". No attempts were made at seeking any paresthesias. Safe injection practices and needle disposal techniques used. Medications properly checked for  expiration dates. SDV (single dose vial) medications used. After the completion of the procedure, all disposable equipment used was discarded in the proper designated medical waste containers. Local Anesthesia: Protocol guidelines were followed. The patient was positioned over the fluoroscopy table. The area was prepped in the usual manner. The time-out was completed. The target area was identified using fluoroscopy. A 12-in long, straight, sterile hemostat was used with fluoroscopic guidance to locate the targets for each level blocked. Once located, the skin was marked with an approved surgical skin marker. Once all sites were marked, the skin (epidermis, dermis, and hypodermis), as well as deeper tissues (fat, connective tissue and muscle) were infiltrated with a small amount of a short-acting local anesthetic, loaded on a 10cc syringe with a 25G, 1.5-in  Needle. An appropriate amount of time was allowed for local anesthetics to take effect before proceeding to the next step. Local Anesthetic: Lidocaine 2.0% The unused portion of the local anesthetic was discarded in the proper designated containers. Technical explanation of process:  Radiofrequency Ablation (RFA) L2 Medial Branch Nerve RFA: The target area for the L2 medial branch is at the junction of the postero-lateral aspect of the superior articular process and the superior, posterior, and medial edge of the transverse process of L3. Under fluoroscopic guidance, a Radiofrequency needle was inserted until contact was made with os over the superior postero-lateral aspect of the pedicular shadow (target area). Sensory and motor testing  was conducted to properly adjust the position of the needle. Once satisfactory placement of the needle was achieved, the numbing solution was slowly injected after negative aspiration for blood. 2.0 mL of the nerve block solution was injected without difficulty or complication. After waiting for at least 3 minutes, the ablation was performed. Once completed, the needle was removed intact. L3 Medial Branch Nerve RFA: The target area for the L3 medial branch is at the junction of the postero-lateral aspect of the superior articular process and the superior, posterior, and medial edge of the transverse process of L4. Under fluoroscopic guidance, a Radiofrequency needle was inserted until contact was made with os over the superior postero-lateral aspect of the pedicular shadow (target area). Sensory and motor testing was conducted to properly adjust the position of the needle. Once satisfactory placement of the needle was achieved, the numbing solution was slowly injected after negative aspiration for blood. 2.0 mL of the nerve block solution was injected without difficulty or complication. After waiting for at least 3 minutes, the ablation was performed. Once completed, the needle was removed intact. L4 Medial Branch Nerve RFA: The target area for the L4 medial branch is at the junction of the postero-lateral aspect of the superior articular process and the superior, posterior, and medial edge of the transverse process of L5. Under fluoroscopic guidance, a Radiofrequency needle was inserted until contact was made with os over the superior postero-lateral aspect of the pedicular shadow (target area). Sensory and motor testing was conducted to properly adjust the position of the needle. Once satisfactory placement of the needle was achieved, the numbing solution was slowly injected after negative aspiration for blood. 2.0 mL of the nerve block solution was injected without difficulty or complication. After waiting for  at least 3 minutes, the ablation was performed. Once completed, the needle was removed intact. L5 Medial Branch Nerve RFA: The target area for the L5 medial branch is at the junction of the postero-lateral aspect of the superior articular process of S1 and the superior, posterior, and medial edge  of the sacral ala. Under fluoroscopic guidance, a Radiofrequency needle was inserted until contact was made with os over the superior postero-lateral aspect of the pedicular shadow (target area). Sensory and motor testing was conducted to properly adjust the position of the needle. Once satisfactory placement of the needle was achieved, the numbing solution was slowly injected after negative aspiration for blood. 2.0 mL of the nerve block solution was injected without difficulty or complication. After waiting for at least 3 minutes, the ablation was performed. Once completed, the needle was removed intact. S1 Medial Branch Nerve RFA: The target area for the S1 medial branch is located inferior to the junction of the S1 superior articular process and the L5 inferior articular process, posterior, inferior, and lateral to the 6 o'clock position of the L5-S1 facet joint, just superior to the S1 posterior foramen. Under fluoroscopic guidance, the Radiofrequency needle was advanced until contact was made with os over the Target area. Sensory and motor testing was conducted to properly adjust the position of the needle. Once satisfactory placement of the needle was achieved, the numbing solution was slowly injected after negative aspiration for blood. 2.0 mL of the nerve block solution was injected without difficulty or complication. After waiting for at least 3 minutes, the ablation was performed. Once completed, the needle was removed intact. Radiofrequency lesioning (ablation):  Radiofrequency Generator: NeuroTherm [id number] Sensory Stimulation Parameters: 50 Hz was used to locate & identify the nerve, making sure that the  needle was positioned such that there was no sensory stimulation below 0.3 V or above 0.7 V. Motor Stimulation Parameters: 2 Hz was used to evaluate the motor component. Care was taken not to lesion any nerves that demonstrated motor stimulation of the lower extremities at an output of less than 2.5 times that of the sensory threshold, or a maximum of 2.0 V. Lesioning Technique Parameters: Standard Radiofrequency settings. (Not bipolar or pulsed.) Temperature Settings: 80 degrees C Lesioning time: 60 seconds Intra-operative Compliance: Compliant Materials & Medications: Needle(s) (Electrode/Cannula) Type: Teflon-coated, curved tip, Radiofrequency needle(s) Gauge: 22G Length: 10cm Numbing solution: 0.2% PF-Ropivacaine + Triamcinolone (40 mg/mL) diluted to a final concentration of 4 mg of Triamcinolone/mL of Ropivacaine The unused portion of the solution was discarded in the proper designated containers.  Once the entire procedure was completed, the treated area was cleaned, making sure to leave some of the prepping solution back to take advantage of its long term bactericidal properties.  Illustration of the posterior view of the lumbar spine and the posterior neural structures. Laminae of L2 through S1 are labeled. DPRL5, dorsal primary ramus of L5; DPRS1, dorsal primary ramus of S1; DPR3, dorsal primary ramus of L3; FJ, facet (zygapophyseal) joint L3-L4; I, inferior articular process of L4; LB1, lateral branch of dorsal primary ramus of L1; IAB, inferior articular branches from L3 medial branch (supplies L4-L5 facet joint); IBP, intermediate branch plexus; MB3, medial branch of dorsal primary ramus of L3; NR3, third lumbar nerve root; S, superior articular process of L5; SAB, superior articular branches from L4 (supplies L4-5 facet joint also); TP3, transverse process of L3.  Vitals:   08/16/18 1244 08/16/18 1254 08/16/18 1304 08/16/18 1314  BP: 125/78 120/61 (!) 106/50 113/68  Pulse: 87 85 86 94   Resp: 17 16 17 20   Temp: 97.9 F (36.6 C)   98.1 F (36.7 C)  TempSrc: Oral     SpO2: 93% 96% 100% 95%  Weight:      Height:  Start Time: 1201 hrs. End Time: 1230 hrs.  Imaging Guidance (Spinal):          Type of Imaging Technique: Fluoroscopy Guidance (Spinal) Indication(s): Assistance in needle guidance and placement for procedures requiring needle placement in or near specific anatomical locations not easily accessible without such assistance. Exposure Time: Please see nurses notes. Contrast: None used. Fluoroscopic Guidance: I was personally present during the use of fluoroscopy. "Tunnel Vision Technique" used to obtain the best possible view of the target area. Parallax error corrected before commencing the procedure. "Direction-depth-direction" technique used to introduce the needle under continuous pulsed fluoroscopy. Once target was reached, antero-posterior, oblique, and lateral fluoroscopic projection used confirm needle placement in all planes. Images permanently stored in EMR. Interpretation: No contrast injected. I personally interpreted the imaging intraoperatively. Adequate needle placement confirmed in multiple planes. Permanent images saved into the patient's record.  Antibiotic Prophylaxis:   Anti-infectives (From admission, onward)   None     Indication(s): None identified  Post-operative Assessment:  Post-procedure Vital Signs:  Pulse/HCG Rate: 94  Temp: 98.1 F (36.7 C) Resp: 20 BP: 113/68 SpO2: 95 %  EBL: None  Complications: No immediate post-treatment complications observed by team, or reported by patient.  Note: The patient tolerated the entire procedure well. A repeat set of vitals were taken after the procedure and the patient was kept under observation following institutional policy, for this type of procedure. Post-procedural neurological assessment was performed, showing return to baseline, prior to discharge. The patient was provided  with post-procedure discharge instructions, including a section on how to identify potential problems. Should any problems arise concerning this procedure, the patient was given instructions to immediately contact us, at any time, without hesitation. In any case, we plan to contact the patient by telephone for a follow-up status report regarding this interventional procedure.  Comments:  No additional relevant information.  Plan of Care    Imaging Orders     DG C-Arm 1-60 Min-No Report  Procedure Orders     Radiofrequency,Lumbar  Medications ordered for procedure: Meds ordered this encounter  Medications  . lidocaine (XYLOCAINE) 2 % (with pres) injection 400 mg  . midazolam (VERSED) 5 MG/5ML injection 1-2 mg    Make sure Flumazenil is available in the pyxis when using this medication. If oversedation occurs, administer 0.2 mg IV over 15 sec. If after 45 sec no response, administer 0.2 mg again over 1 min; may repeat at 1 min intervals; not to exceed 4 doses (1 mg)  . fentaNYL (SUBLIMAZE) injection 25-50 mcg    Make sure Narcan is available in the pyxis when using this medication. In the event of respiratory depression (RR< 8/min): Titrate NARCAN (naloxone) in increments of 0.1 to 0.2 mg IV at 2-3 minute intervals, until desired degree of reversal.  . lactated ringers infusion 1,000 mL  . ropivacaine (PF) 2 mg/mL (0.2%) (NAROPIN) injection 9 mL  . triamcinolone acetonide (KENALOG-40) injection 40 mg  . HYDROcodone-acetaminophen (NORCO/VICODIN) 5-325 MG tablet    Sig: Take 1 tablet by mouth every 8 (eight) hours as needed for up to 7 days for severe pain. Must last 7 days.    Dispense:  21 tablet    Refill:  0    For acute post-operative pain. Not to be refilled. Must last 7 days.  [name] HYDROcodone-acetaminophen (NORCO/VICODIN) 5-325 MG tablet    Sig: Take 1 tablet by mouth every 8 (eight) hours as needed for up to 7 days for severe pain. Must last for  7 days.    Dispense:  21 tablet     Refill:  0    For acute post-operative pain. Not to be refilled. Must last 7 days.   Medications administered: We administered lidocaine, midazolam, fentaNYL, lactated ringers, ropivacaine (PF) 2 mg/mL (0.2%), and triamcinolone acetonide.  See the medical record for exact dosing, route, and time of administration.  Disposition: Discharge home  Discharge Date & Time: 08/16/2018; 1318 hrs.   Physician-requested Follow-up: Return for Post-RFA eval (6 wks), w/ Thad Ranger, NP.  Future Appointments  Date Time Provider Department Center  09/06/2018 10:45 AM Pollyann Savoy, MD [facility] None  09/27/2018 11:15 AM Barbette Merino, NP [facility] None   Primary Care Physician: Ignatius Specking, MD Location: [facility] Note by: Oswaldo Done, MD Date: 08/16/2018; Time: 2:57 PM  Disclaimer:  Medicine is not an Visual merchandiser. The only guarantee in medicine is that nothing is guaranteed. It is important to note that the decision to proceed with this intervention was based on the information collected from the patient. The Data and conclusions were drawn from the patient's questionnaire, the interview, and the physical examination. Because the information was provided in large part by the patient, it cannot be guaranteed that it has not been purposely or unconsciously manipulated. Every effort has been made to obtain as much relevant data as possible for this evaluation. It is important to note that the conclusions that lead to this procedure are derived in large part from the available data. Always take into account that the treatment will also be dependent on availability of resources and existing treatment guidelines, considered by other Pain Management Practitioners as being common knowledge and practice, at the time of the intervention. For Medico-Legal purposes, it is also important to point out that variation in procedural techniques and pharmacological choices are the  acceptable norm. The indications, contraindications, technique, and results of the above procedure should only be interpreted and judged by a Board-Certified Interventional Pain Specialist with extensive familiarity and expertise in the same exact procedure and technique.

## 2018-08-16 NOTE — Addendum Note (Signed)
Addended by: Delano Metz A on: 08/16/2018 02:57 PM   Modules accepted: Orders

## 2018-08-16 NOTE — Telephone Encounter (Signed)
Orders were not released, so med was not sent to pharmacy. Orders released, patient notified script now has been sent.

## 2018-08-16 NOTE — Telephone Encounter (Signed)
Her script is not at the pharmacy and she said he was going to escribe it.

## 2018-08-23 NOTE — Progress Notes (Deleted)
Office Visit Note  Patient: Jennifer Harmon             Date of Birth: [birth year]           MRN: [id number]             PCP: Ignatius Specking, MD Referring: Ignatius Specking, MD Visit Date: 09/06/2018 Occupation: @GUAROCC @  Subjective:  No chief complaint on file.   History of Present Illness: Jennifer Harmon is a 57 y.o. female ***   Activities of Daily Living:  Patient reports morning stiffness for *** {minute/hour:19697}.   Patient {ACTIONS;DENIES/REPORTS:21021675::"Denies"} nocturnal pain.  Difficulty dressing/grooming: {ACTIONS;DENIES/REPORTS:21021675::"Denies"} Difficulty climbing stairs: {ACTIONS;DENIES/REPORTS:21021675::"Denies"} Difficulty getting out of chair: {ACTIONS;DENIES/REPORTS:21021675::"Denies"} Difficulty using hands for taps, buttons, cutlery, and/or writing: {ACTIONS;DENIES/REPORTS:21021675::"Denies"}  No Rheumatology ROS completed.   PMFS History:  Patient Active Problem List   Diagnosis Date Noted  . Acute postoperative pain 05/03/2018  . Macromastia 01/31/2018  . Thoracic spondylosis with radiculopathy 01/31/2018  . Osteoarthritis of hip (Bilateral) (R>L) 01/18/2018  . Spondylosis without myelopathy or radiculopathy, lumbosacral region 01/03/2018  . Chronic hip pain (Bilateral) (R>L) 01/03/2018  . Other specified dorsopathies, sacral and sacrococcygeal region 01/03/2018  . Lumbar facet syndrome (Bilateral) (R>L) 12/04/2017  . Lumbar paraspinal muscle spasm 12/04/2017  . Chronic lower extremity pain (Bilateral) (R>L) 11/21/2017  . Chronic lower extremity radicular pain (L5) (Right) 11/21/2017  . DISH (diffuse idiopathic skeletal hyperostosis) 11/21/2017  . 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 11/21/2017  . DDD (degenerative disc disease), thoracic 11/21/2017  . 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/2017  . Fusion of lumbar spine 11/21/2017  . Long term prescription opiate use 11/13/2017  . NSAID long-term use 11/13/2017  . Opiate use  11/13/2017  . Pharmacologic therapy 11/13/2017  . Problems influencing health status 11/13/2017  . Neurogenic pain 11/13/2017  . Chronic musculoskeletal pain 11/13/2017  . Chronic recurrent lower extremity muscle spasms (Bilateral) 11/13/2017  . Long term prescription benzodiazepine use 11/13/2017  . Thoracic radiculopathy due to osteoarthritis of spine 11/13/2017  . Thoracic radiculitis (T6/T7) (Right) 11/13/2017  . Chronic low back pain  (Secondary Area of Pain) (Bilateral) (R>L) 11/12/2017  . Chronic knee pain (Fourth Area of Pain) (Bilateral) (R>L) 11/12/2017  . Chronic upper back pain (Primary Area of Pain) (Bilateral) (R>L) 09/06/2017  . Chronic neck pain [facility] Area of Pain) (Bilateral) (R>L) 09/06/2017  . Chronic shoulder pain 09/06/2017  . Chronic upper extremity pain (Bilateral) (R>L) 09/06/2017  . Chronic pain syndrome 09/06/2017  . Long term current use of opiate analgesic 09/06/2017  . Disorder of skeletal system 09/06/2017  . Chronic sacroiliac joint pain (Bilateral) (R>L) 09/06/2017  . Chronic inflammatory arthritis 07/24/2017  . Dyslipidemia 06/30/2017  . History of gastroesophageal reflux (GERD) 06/30/2017  . History of sleep apnea 06/30/2017  . History of depression 06/30/2017  . History of anxiety 06/30/2017  . Fibromyalgia 06/30/2017  . DDD (degenerative disc disease), lumbar 06/30/2017  . S/P partial knee replacement (Left) 06/30/2017  . S/P total knee replacement (Right) 06/30/2017  . Osteoarthritis of feet (Bilateral) 06/30/2017  . History of diverticulosis 06/30/2017  . Former smoker 06/30/2017  . Chronic diarrhea 06/30/2017  . Symptomatic mammary hypertrophy 09/19/2016  . Osteoarthritis of knee (Right) 03/14/2012  . History of knee arthroplasty (Bilateral) 12/27/2011    Past Medical History:  Diagnosis Date  . Anxiety   . Chronic chest pain   . Depression   . DJD (degenerative joint disease)   . Fibromyalgia   . GERD (gastroesophageal  reflux  disease)   . Hypercholesterolemia     Family History  Problem Relation Age of Onset  . Hypertension Mother   . Arthritis Father    Past Surgical History:  Procedure Laterality Date  . BACK SURGERY  2008  . CESAREAN SECTION    . CHOLECYSTECTOMY     Social History   Social History Narrative  . Not on file    Objective: Vital Signs: There were no vitals taken for this visit.   Physical Exam   Musculoskeletal Exam: ***  CDAI Exam: CDAI Score: Not documented Patient Global Assessment: Not documented; Provider Global Assessment: Not documented Swollen: Not documented; Tender: Not documented Joint Exam   Not documented   There is currently no information documented on the homunculus. Go to the Rheumatology activity and complete the homunculus joint exam.  Investigation: No additional findings.  Imaging: Dg C-arm 1-60 Min-no Report  Result Date: 08/16/2018 Fluoroscopy was utilized by the requesting physician.  No radiographic interpretation.    Recent Labs: Lab Results  Component Value Date   WBC 8.9 07/03/2017   HGB 14.0 07/03/2017   PLT 197 07/03/2017   NA 140 09/06/2017   K 4.0 09/06/2017   CL 104 09/06/2017   CO2 25 09/06/2017   GLUCOSE 83 09/06/2017   BUN 16 09/06/2017   CREATININE 0.67 09/06/2017   BILITOT 0.9 09/06/2017   ALKPHOS 100 09/06/2017   AST 26 09/06/2017   ALT 31 09/06/2017   PROT 8.2 (H) 09/06/2017   ALBUMIN 4.8 09/06/2017   CALCIUM 10.0 09/06/2017   GFRAA >60 09/06/2017    Speciality Comments: No specialty comments available.  Procedures:  No procedures performed Allergies: Asa [aspirin]; Bupropion; and Tape   Assessment / Plan:     Visit Diagnoses: No diagnosis found.   Orders: No orders of the defined types were placed in this encounter.  No orders of the defined types were placed in this encounter.   Face-to-face time spent with patient was *** minutes. Greater than 50% of time was spent in counseling and coordination  of care.  Follow-Up Instructions: No follow-ups on file.   Ellen Henri, CMA  Note - This record has been created using Animal nutritionist.  Chart creation errors have been sought, but may not always  have been located. Such creation errors do not reflect on  the standard of medical care.

## 2018-08-24 ENCOUNTER — Ambulatory Visit: Payer: PPO | Admitting: Rheumatology

## 2018-09-06 ENCOUNTER — Ambulatory Visit: Payer: PPO | Admitting: Rheumatology

## 2018-09-13 DIAGNOSIS — G4733 Obstructive sleep apnea (adult) (pediatric): Secondary | ICD-10-CM | POA: Diagnosis not present

## 2018-09-17 NOTE — Progress Notes (Deleted)
Office Visit Note  Patient: Jennifer Harmon             Date of Birth: [birth year]           MRN: [id number]             PCP: Ignatius Specking, MD Referring: Ignatius Specking, MD Visit Date: 10/01/2018 Occupation: @GUAROCC @  Subjective:  No chief complaint on file.   History of Present Illness: Jennifer Harmon is a 57 y.o. female ***   Activities of Daily Living:  Patient reports morning stiffness for *** {minute/hour:19697}.   Patient {ACTIONS;DENIES/REPORTS:21021675::"Denies"} nocturnal pain.  Difficulty dressing/grooming: {ACTIONS;DENIES/REPORTS:21021675::"Denies"} Difficulty climbing stairs: {ACTIONS;DENIES/REPORTS:21021675::"Denies"} Difficulty getting out of chair: {ACTIONS;DENIES/REPORTS:21021675::"Denies"} Difficulty using hands for taps, buttons, cutlery, and/or writing: {ACTIONS;DENIES/REPORTS:21021675::"Denies"}  No Rheumatology ROS completed.   PMFS History:  Patient Active Problem List   Diagnosis Date Noted  . Acute postoperative pain 05/03/2018  . Macromastia 01/31/2018  . Thoracic spondylosis with radiculopathy 01/31/2018  . Osteoarthritis of hip (Bilateral) (R>L) 01/18/2018  . Spondylosis without myelopathy or radiculopathy, lumbosacral region 01/03/2018  . Chronic hip pain (Bilateral) (R>L) 01/03/2018  . Other specified dorsopathies, sacral and sacrococcygeal region 01/03/2018  . Lumbar facet syndrome (Bilateral) (R>L) 12/04/2017  . Lumbar paraspinal muscle spasm 12/04/2017  . Chronic lower extremity pain (Bilateral) (R>L) 11/21/2017  . Chronic lower extremity radicular pain (L5) (Right) 11/21/2017  . DISH (diffuse idiopathic skeletal hyperostosis) 11/21/2017  . 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 11/21/2017  . DDD (degenerative disc disease), thoracic 11/21/2017  . 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/2017  . Fusion of lumbar spine 11/21/2017  . Long term prescription opiate use 11/13/2017  . NSAID long-term use 11/13/2017  . Opiate use  11/13/2017  . Pharmacologic therapy 11/13/2017  . Problems influencing health status 11/13/2017  . Neurogenic pain 11/13/2017  . Chronic musculoskeletal pain 11/13/2017  . Chronic recurrent lower extremity muscle spasms (Bilateral) 11/13/2017  . Long term prescription benzodiazepine use 11/13/2017  . Thoracic radiculopathy due to osteoarthritis of spine 11/13/2017  . Thoracic radiculitis (T6/T7) (Right) 11/13/2017  . Chronic low back pain  (Secondary Area of Pain) (Bilateral) (R>L) 11/12/2017  . Chronic knee pain (Fourth Area of Pain) (Bilateral) (R>L) 11/12/2017  . Chronic upper back pain (Primary Area of Pain) (Bilateral) (R>L) 09/06/2017  . Chronic neck pain [facility] Area of Pain) (Bilateral) (R>L) 09/06/2017  . Chronic shoulder pain 09/06/2017  . Chronic upper extremity pain (Bilateral) (R>L) 09/06/2017  . Chronic pain syndrome 09/06/2017  . Long term current use of opiate analgesic 09/06/2017  . Disorder of skeletal system 09/06/2017  . Chronic sacroiliac joint pain (Bilateral) (R>L) 09/06/2017  . Chronic inflammatory arthritis 07/24/2017  . Dyslipidemia 06/30/2017  . History of gastroesophageal reflux (GERD) 06/30/2017  . History of sleep apnea 06/30/2017  . History of depression 06/30/2017  . History of anxiety 06/30/2017  . Fibromyalgia 06/30/2017  . DDD (degenerative disc disease), lumbar 06/30/2017  . S/P partial knee replacement (Left) 06/30/2017  . S/P total knee replacement (Right) 06/30/2017  . Osteoarthritis of feet (Bilateral) 06/30/2017  . History of diverticulosis 06/30/2017  . Former smoker 06/30/2017  . Chronic diarrhea 06/30/2017  . Symptomatic mammary hypertrophy 09/19/2016  . Osteoarthritis of knee (Right) 03/14/2012  . History of knee arthroplasty (Bilateral) 12/27/2011    Past Medical History:  Diagnosis Date  . Anxiety   . Chronic chest pain   . Depression   . DJD (degenerative joint disease)   . Fibromyalgia   . GERD (gastroesophageal  reflux  disease)   . Hypercholesterolemia     Family History  Problem Relation Age of Onset  . Hypertension Mother   . Arthritis Father    Past Surgical History:  Procedure Laterality Date  . BACK SURGERY  2008  . CESAREAN SECTION    . CHOLECYSTECTOMY     Social History   Social History Narrative  . Not on file    Objective: Vital Signs: There were no vitals taken for this visit.   Physical Exam   Musculoskeletal Exam: ***  CDAI Exam: CDAI Score: Not documented Patient Global Assessment: Not documented; Provider Global Assessment: Not documented Swollen: Not documented; Tender: Not documented Joint Exam   Not documented   There is currently no information documented on the homunculus. Go to the Rheumatology activity and complete the homunculus joint exam.  Investigation: No additional findings.  Imaging: No results found.  Recent Labs: Lab Results  Component Value Date   WBC 8.9 07/03/2017   HGB 14.0 07/03/2017   PLT 197 07/03/2017   NA 140 09/06/2017   K 4.0 09/06/2017   CL 104 09/06/2017   CO2 25 09/06/2017   GLUCOSE 83 09/06/2017   BUN 16 09/06/2017   CREATININE 0.67 09/06/2017   BILITOT 0.9 09/06/2017   ALKPHOS 100 09/06/2017   AST 26 09/06/2017   ALT 31 09/06/2017   PROT 8.2 (H) 09/06/2017   ALBUMIN 4.8 09/06/2017   CALCIUM 10.0 09/06/2017   GFRAA >60 09/06/2017    Speciality Comments: No specialty comments available.  Procedures:  No procedures performed Allergies: Asa [aspirin]; Bupropion; and Tape   Assessment / Plan:     Visit Diagnoses: No diagnosis found.   Orders: No orders of the defined types were placed in this encounter.  No orders of the defined types were placed in this encounter.   Face-to-face time spent with patient was *** minutes. Greater than 50% of time was spent in counseling and coordination of care.  Follow-Up Instructions: No follow-ups on file.   Ellen Henri, CMA  Note - This record has been created  using Animal nutritionist.  Chart creation errors have been sought, but may not always  have been located. Such creation errors do not reflect on  the standard of medical care.

## 2018-09-26 ENCOUNTER — Telehealth: Payer: Self-pay

## 2018-09-26 NOTE — Telephone Encounter (Signed)
She called in wanting to let you know that she is taking more of her medicine than she is supposed to because she is so much pain, and it still is not helping. She has appt tomorrow and she wanted you to know that before she came in.

## 2018-09-27 ENCOUNTER — Other Ambulatory Visit: Payer: Self-pay

## 2018-09-27 ENCOUNTER — Encounter: Payer: Self-pay | Admitting: Nurse Practitioner

## 2018-09-27 ENCOUNTER — Ambulatory Visit: Payer: PPO | Attending: Nurse Practitioner | Admitting: Nurse Practitioner

## 2018-09-27 VITALS — BP 126/57 | HR 90 | Temp 97.7°F | Ht 63.0 in | Wt 201.0 lb

## 2018-09-27 DIAGNOSIS — R252 Cramp and spasm: Secondary | ICD-10-CM | POA: Insufficient documentation

## 2018-09-27 DIAGNOSIS — M25561 Pain in right knee: Secondary | ICD-10-CM | POA: Diagnosis not present

## 2018-09-27 DIAGNOSIS — Z87891 Personal history of nicotine dependence: Secondary | ICD-10-CM | POA: Insufficient documentation

## 2018-09-27 DIAGNOSIS — M1711 Unilateral primary osteoarthritis, right knee: Secondary | ICD-10-CM | POA: Insufficient documentation

## 2018-09-27 DIAGNOSIS — M792 Neuralgia and neuritis, unspecified: Secondary | ICD-10-CM

## 2018-09-27 DIAGNOSIS — M1991 Primary osteoarthritis, unspecified site: Secondary | ICD-10-CM | POA: Insufficient documentation

## 2018-09-27 DIAGNOSIS — M7918 Myalgia, other site: Secondary | ICD-10-CM

## 2018-09-27 DIAGNOSIS — G8929 Other chronic pain: Secondary | ICD-10-CM

## 2018-09-27 DIAGNOSIS — G894 Chronic pain syndrome: Secondary | ICD-10-CM | POA: Diagnosis not present

## 2018-09-27 DIAGNOSIS — M47817 Spondylosis without myelopathy or radiculopathy, lumbosacral region: Secondary | ICD-10-CM

## 2018-09-27 DIAGNOSIS — Z79899 Other long term (current) drug therapy: Secondary | ICD-10-CM | POA: Diagnosis not present

## 2018-09-27 DIAGNOSIS — M25562 Pain in left knee: Secondary | ICD-10-CM | POA: Insufficient documentation

## 2018-09-27 DIAGNOSIS — M797 Fibromyalgia: Secondary | ICD-10-CM | POA: Diagnosis not present

## 2018-09-27 DIAGNOSIS — M62838 Other muscle spasm: Secondary | ICD-10-CM | POA: Diagnosis not present

## 2018-09-27 DIAGNOSIS — M549 Dorsalgia, unspecified: Secondary | ICD-10-CM | POA: Insufficient documentation

## 2018-09-27 DIAGNOSIS — Z5181 Encounter for therapeutic drug level monitoring: Secondary | ICD-10-CM | POA: Insufficient documentation

## 2018-09-27 MED ORDER — LYRICA 100 MG PO CAPS: 100.0000 mg | ORAL_CAPSULE | Freq: Two times a day (BID) | ORAL | 2 refills | Status: DC

## 2018-09-27 MED ORDER — MAGNESIUM OXIDE -MG SUPPLEMENT 500 MG PO CAPS: 1.0000 | ORAL_CAPSULE | Freq: Two times a day (BID) | ORAL | 2 refills | Status: DC

## 2018-09-27 MED ORDER — OXYCODONE HCL 5 MG PO TABS: 5.0000 mg | ORAL_TABLET | Freq: Two times a day (BID) | ORAL | 0 refills | Status: DC | PRN

## 2018-09-27 MED ORDER — CYCLOBENZAPRINE HCL 10 MG PO TABS: 10.0000 mg | ORAL_TABLET | Freq: Two times a day (BID) | ORAL | 2 refills | Status: DC | PRN

## 2018-09-27 NOTE — Patient Instructions (Addendum)
____________________________________________________________________________________________  Medication Rules  Purpose: To inform patients, and their family members, of our rules and regulations.  Applies to: All patients receiving prescriptions (written or electronic).  Pharmacy of record: Pharmacy where electronic prescriptions will be sent. If written prescriptions are taken to a different pharmacy, please inform the nursing staff. The pharmacy listed in the electronic medical record should be the one where you would like electronic prescriptions to be sent.  Electronic prescriptions: In compliance with the [place] Strengthen Opioid Misuse Prevention (STOP) Act of 2017 (Session Law 2017-74/H243), effective November 07, 2018, all controlled substances must be electronically prescribed. Calling prescriptions to the pharmacy will cease to exist.  Prescription refills: Only during scheduled appointments. Applies to all prescriptions.  NOTE: The following applies primarily to controlled substances (Opioid* Pain Medications).   Patient's responsibilities: 1. Pain Pills: Bring all pain pills to every appointment (except for procedure appointments). 2. Pill Bottles: Bring pills in original pharmacy bottle. Always bring the newest bottle. Bring bottle, even if empty. 3. Medication refills: You are responsible for knowing and keeping track of what medications you take and those you need refilled. The day before your appointment: write a list of all prescriptions that need to be refilled. The day of the appointment: give the list to the admitting nurse. Prescriptions will be written only during appointments. If you forget a medication: it will not be "Called in", "Faxed", or "electronically sent". You will need to get another appointment to get these prescribed. No early refills. Do not call asking to have your prescription filled early. 4. Prescription Accuracy: You are responsible for  carefully inspecting your prescriptions before leaving our office. Have the discharge nurse carefully go over each prescription with you, before taking them home. Make sure that your name is accurately spelled, that your address is correct. Check the name and dose of your medication to make sure it is accurate. Check the number of pills, and the written instructions to make sure they are clear and accurate. Make sure that you are given enough medication to last until your next medication refill appointment. 5. Taking Medication: Take medication as prescribed. When it comes to controlled substances, taking less pills or less frequently than prescribed is permitted and encouraged. Never take more pills than instructed. Never take medication more frequently than prescribed.  6. Inform other Doctors: Always inform, all of your healthcare providers, of all the medications you take. 7. Pain Medication from other Providers: You are not allowed to accept any additional pain medication from any other Doctor or Healthcare provider. There are two exceptions to this rule. (see below) In the event that you require additional pain medication, you are responsible for notifying us, as stated below. 8. Medication Agreement: You are responsible for carefully reading and following our Medication Agreement. This must be signed before receiving any prescriptions from our practice. Safely store a copy of your signed Agreement. Violations to the Agreement will result in no further prescriptions. (Additional copies of our Medication Agreement are available upon request.) 9. Laws, Rules, & Regulations: All patients are expected to follow all Federal and State Laws, Statutes, Rules, & Regulations. Ignorance of the Laws does not constitute a valid excuse. The use of any illegal substances is prohibited. 10. Adopted CDC guidelines & recommendations: Target dosing levels will be at or below 60 MME/day. Use of benzodiazepines** is not  recommended.  Exceptions: There are only two exceptions to the rule of not receiving pain medications from other Healthcare Providers. 1.   Exception #1 (Emergencies): In the event of an emergency (i.e.: accident requiring emergency care), you are allowed to receive additional pain medication. However, you are responsible for: As soon as you are able, call our office [phone], at any time of the day or night, and leave a message stating your name, the date and nature of the emergency, and the name and dose of the medication prescribed. In the event that your call is answered by a member of our staff, make sure to document and save the date, time, and the name of the person that took your information.  2. Exception #2 (Planned Surgery): In the event that you are scheduled by another doctor or dentist to have any type of surgery or procedure, you are allowed (for a period no longer than 30 days), to receive additional pain medication, for the acute post-op pain. However, in this case, you are responsible for picking up a copy of our "Post-op Pain Management for Surgeons" handout, and giving it to your surgeon or dentist. This document is available at our office, and does not require an appointment to obtain it. Simply go to our office during business hours (Monday-Thursday from 8:00 AM to 4:00 PM) (Friday 8:00 AM to 12:00 Noon) or if you have a scheduled appointment with [place], prior to your surgery, and ask for it by name. In addition, you will need to provide [place] with your name, name of your surgeon, type of surgery, and date of procedure or surgery.  *Opioid medications include: morphine, codeine, oxycodone, oxymorphone, hydrocodone, hydromorphone, meperidine, tramadol, tapentadol, buprenorphine, fentanyl, methadone. **Benzodiazepine medications include: diazepam (Valium), alprazolam (Xanax), clonazepam (Klonopine), lorazepam (Ativan), clorazepate (Tranxene), chlordiazepoxide (Librium), estazolam (Prosom),  oxazepam (Serax), temazepam (Restoril), triazolam (Halcion) (Last updated: 01/04/2018) ____________________________________________________________________________________________    ______________________________________________________________________________________________  Specialty Pain Scale  Introduction:  There are significant differences in how pain is reported. The word pain usually refers to physical pain, but it is also a common synonym of suffering. The medical community uses a scale from 0 (zero) to 10 (ten) to report pain level. Zero (0) is described as "no pain", while ten (10) is described as "the worse pain you can imagine". The problem with this scale is that physical pain is reported along with suffering. Suffering refers to mental pain, or more often yet it refers to any unpleasant feeling, emotion or aversion associated with the perception of harm or threat of harm. It is the psychological component of pain.  Pain Specialists prefer to separate the two components. The pain scale used by this practice is the Verbal Numerical Rating Scale (VNRS-11). This scale is for the physical pain only. DO NOT INCLUDE how your pain psychologically affects you. This scale is for adults 75 years of age and older. It has 11 (eleven) levels. The 1st level is 0/10. This means: "right now, I have no pain". In the context of pain management, it also means: "right now, my physical pain is under control with the current therapy".  General Information:  The scale should reflect your current level of pain. Unless you are specifically asked for the level of your worst pain, or your average pain. If you are asked for one of these two, then it should be understood that it is over the past 24 hours.  Levels 1 (one) through 5 (five) are described below, and can be treated as an outpatient. Ambulatory pain management facilities such as ours are more than adequate to treat these levels. Levels 6 (six) through  10 (ten) are also described below, however, these must be treated as a hospitalized patient. While levels 6 (six) and 7 (seven) may be evaluated at an urgent care facility, levels 8 (eight) through 10 (ten) constitute medical emergencies and as such, they belong in a hospital's emergency department. When having these levels (as described below), do not come to our office. Our facility is not equipped to manage these levels. Go directly to an urgent care facility or an emergency department to be evaluated.  Definitions:  Activities of Daily Living (ADL): Activities of daily living (ADL or ADLs) is a term used in healthcare to refer to people's daily self-care activities. Health professionals often use a person's ability or inability to perform ADLs as a measurement of their functional status, particularly in regard to people post injury, with disabilities and the elderly. There are two ADL levels: Basic and Instrumental. Basic Activities of Daily Living (BADL  or BADLs) consist of self-care tasks that include: Bathing and showering; personal hygiene and grooming (including brushing/combing/styling hair); dressing; Toilet hygiene (getting to the toilet, cleaning oneself, and getting back up); eating and self-feeding (not including cooking or chewing and swallowing); functional mobility, often referred to as "transferring", as measured by the ability to walk, get in and out of bed, and get into and out of a chair; the broader definition (moving from one place to another while performing activities) is useful for people with different physical abilities who are still able to get around independently. Basic ADLs include the things many people do when they get up in the morning and get ready to go out of the house: get out of bed, go to the toilet, bathe, dress, groom, and eat. On the average, loss of function typically follows a particular order. Hygiene is the first to go, followed by loss of toilet use and  locomotion. The last to go is the ability to eat. When there is only one remaining area in which the person is independent, there is a 62.9% chance that it is eating and only a 3.5% chance that it is hygiene. Instrumental Activities of Daily Living (IADL or IADLs) are not necessary for fundamental functioning, but they let an individual live independently in a community. IADL consist of tasks that include: cleaning and maintaining the house; home establishment and maintenance; care of others (including selecting and supervising caregivers); care of pets; child rearing; managing money; managing financials (investments, etc.); meal preparation and cleanup; shopping for groceries and necessities; moving within the community; safety procedures and emergency responses; health management and maintenance (taking prescribed medications); and using the telephone or other form of communication.  Instructions:  Most patients tend to report their pain as a combination of two factors, their physical pain and their psychosocial pain. This last one is also known as "suffering" and it is reflection of how physical pain affects you socially and psychologically. From now on, report them separately.  From this point on, when asked to report your pain level, report only your physical pain. Use the following table for reference.  Pain Clinic Pain Levels (0-5/10)  Pain Level Score  Description  No Pain 0   Mild pain 1 Nagging, annoying, but does not interfere with basic activities of daily living (ADL). Patients are able to eat, bathe, get dressed, toileting (being able to get on and off the toilet and perform personal hygiene functions), transfer (move in and out of bed or a chair without assistance), and maintain continence (able to control bladder  and bowel functions). Blood pressure and heart rate are unaffected. A normal heart rate for a healthy adult ranges from 60 to 100 bpm (beats per minute).   Mild to moderate pain  2 Noticeable and distracting. Impossible to hide from other people. More frequent flare-ups. Still possible to adapt and function close to normal. It can be very annoying and may have occasional stronger flare-ups. With discipline, patients may get used to it and adapt.   Moderate pain 3 Interferes significantly with activities of daily living (ADL). It becomes difficult to feed, bathe, get dressed, get on and off the toilet or to perform personal hygiene functions. Difficult to get in and out of bed or a chair without assistance. Very distracting. With effort, it can be ignored when deeply involved in activities.   Moderately severe pain 4 Impossible to ignore for more than a few minutes. With effort, patients may still be able to manage work or participate in some social activities. Very difficult to concentrate. Signs of autonomic nervous system discharge are evident: dilated pupils (mydriasis); mild sweating (diaphoresis); sleep interference. Heart rate becomes elevated (>115 bpm). Diastolic blood pressure (lower number) rises above 100 mmHg. Patients find relief in laying down and not moving.   Severe pain 5 Intense and extremely unpleasant. Associated with frowning face and frequent crying. Pain overwhelms the senses.  Ability to do any activity or maintain social relationships becomes significantly limited. Conversation becomes difficult. Pacing back and forth is common, as getting into a comfortable position is nearly impossible. Pain wakes you up from deep sleep. Physical signs will be obvious: pupillary dilation; increased sweating; goosebumps; brisk reflexes; cold, clammy hands and feet; nausea, vomiting or dry heaves; loss of appetite; significant sleep disturbance with inability to fall asleep or to remain asleep. When persistent, significant weight loss is observed due to the complete loss of appetite and sleep deprivation.  Blood pressure and heart rate becomes significantly elevated. Caution:  If elevated blood pressure triggers a pounding headache associated with blurred vision, then the patient should immediately seek attention at an urgent or emergency care unit, as these may be signs of an impending stroke.    Emergency Department Pain Levels (6-10/10)  Emergency Room Pain 6 Severely limiting. Requires emergency care and should not be seen or managed at an outpatient pain management facility. Communication becomes difficult and requires great effort. Assistance to reach the emergency department may be required. Facial flushing and profuse sweating along with potentially dangerous increases in heart rate and blood pressure will be evident.   Distressing pain 7 Self-care is very difficult. Assistance is required to transport, or use restroom. Assistance to reach the emergency department will be required. Tasks requiring coordination, such as bathing and getting dressed become very difficult.   Disabling pain 8 Self-care is no longer possible. At this level, pain is disabling. The individual is unable to do even the most "basic" activities such as walking, eating, bathing, dressing, transferring to a bed, or toileting. Fine motor skills are lost. It is difficult to think clearly.   Incapacitating pain 9 Pain becomes incapacitating. Thought processing is no longer possible. Difficult to remember your own name. Control of movement and coordination are lost.   The worst pain imaginable 10 At this level, most patients pass out from pain. When this level is reached, collapse of the autonomic nervous system occurs, leading to a sudden drop in blood pressure and heart rate. This in turn results in a temporary and dramatic drop in  blood flow to the brain, leading to a loss of consciousness. Fainting is one of the body's self defense mechanisms. Passing out puts the brain in a calmed state and causes it to shut down for a while, in order to begin the healing process.    Summary: 1. Refer to this  scale when providing [place] with your pain level. 2. Be accurate and careful when reporting your pain level. This will help with your care. 3. Over-reporting your pain level will lead to loss of credibility. 4. Even a level of 1/10 means that there is pain and will be treated at our facility. 5. High, inaccurate reporting will be documented as "Symptom Exaggeration", leading to loss of credibility and suspicions of possible secondary gains such as obtaining more narcotics, or wanting to appear disabled, for fraudulent reasons. 6. Only pain levels of 5 or below will be seen at our facility. 7. Pain levels of 6 and above will be sent to the Emergency Department and the appointment cancelled. ______________________________________________________________________________________________    BMI Assessment: Estimated body mass index is 35.61 kg/m as calculated from the following:   Height as of this encounter: 5\' 3"  (1.6 m).   Weight as of this encounter: 201 lb (91.2 kg).  BMI interpretation table: BMI level Category Range association with higher incidence of chronic pain  <18 kg/m2 Underweight   18.5-24.9 kg/m2 Ideal body weight   25-29.9 kg/m2 Overweight Increased incidence by 20%  30-34.9 kg/m2 Obese (Class I) Increased incidence by 68%  35-39.9 kg/m2 Severe obesity (Class II) Increased incidence by 136%  >40 kg/m2 Extreme obesity (Class III) Increased incidence by 254%   Patient's current BMI Ideal Body weight  Body mass index is 35.61 kg/m. Ideal body weight: 52.4 kg (115 lb 8.3 oz) Adjusted ideal body weight: 67.9 kg (149 lb 11.4 oz)   BMI Readings from Last 4 Encounters:  09/27/18 35.61 kg/m  08/16/18 35.61 kg/m  06/18/18 35.96 kg/m  05/16/18 36.49 kg/m   Wt Readings from Last 4 Encounters:  09/27/18 201 lb (91.2 kg)  08/16/18 201 lb (91.2 kg)  06/18/18 203 lb (92.1 kg)  05/16/18 206 lb (93.4 kg)

## 2018-09-27 NOTE — Progress Notes (Signed)
Patient's Name: Jennifer Harmon  MRN: [id number]  Referring Provider: Glenda Chroman, MD  DOB: [birth year]  PCP: Glenda Chroman, MD  DOS: 09/27/2018  Note by: Vevelyn Francois NP  Service setting: Ambulatory outpatient  Specialty: Interventional Pain Management  Location: [facility] (AMB) Pain Management Facility    Patient type: Established    Primary Reason(s) for Visit: Encounter for prescription drug management & post-procedure evaluation of chronic illness with mild to moderate exacerbation(Level of risk: moderate) CC: Back Pain  HPI  Jennifer Harmon is a 57 y.o. year old, female patient, who comes today for a post-procedure evaluation and medication management. She has Dyslipidemia; History of gastroesophageal reflux (GERD); History of sleep apnea; History of depression; History of anxiety; Fibromyalgia; DDD (degenerative disc disease), lumbar; S/P partial knee replacement (Left); S/P total knee replacement (Right); Osteoarthritis of feet (Bilateral); History of diverticulosis; Former smoker; Chronic diarrhea; Chronic inflammatory arthritis; History of knee arthroplasty (Bilateral); Osteoarthritis of knee (Right); Symptomatic mammary hypertrophy; Chronic upper back pain (Primary Area of Pain) (Bilateral) (R>L); Chronic neck pain (Tertiary Area of Pain) (Bilateral) (R>L); Chronic shoulder pain; Chronic upper extremity pain (Bilateral) (R>L); Chronic pain syndrome; Long term current use of opiate analgesic; Disorder of skeletal system; Chronic sacroiliac joint pain (Bilateral) (R>L); Chronic low back pain  (Secondary Area of Pain) (Bilateral) (R>L); Chronic knee pain (Fourth Area of Pain) (Bilateral) (R>L); Long term prescription opiate use; NSAID long-term use; Opiate use; Pharmacologic therapy; Problems influencing health status; Neurogenic pain; Chronic musculoskeletal pain; Chronic recurrent lower extremity muscle spasms (Bilateral); Long term prescription benzodiazepine use; Thoracic radiculopathy due to  osteoarthritis of spine; Thoracic radiculitis (T6/T7) (Right); Chronic lower extremity pain (Bilateral) (R>L); Chronic lower extremity radicular pain (L5) (Right); DISH (diffuse idiopathic skeletal hyperostosis); 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 Her primarily concern today is the Back Pain  Pain Assessment: Location:   Back Radiating: pain radiaties down back to buttock, hip and legs Onset: More than a month ago Duration: Chronic pain Quality: Aching, Constant, Numbness, Discomfort, Pressure Severity: 8 /10 (subjective, self-reported pain score)  Note: Reported level is compatible with observation. Clinically the patient looks like a 2/10 A 2/10 is viewed as "Mild to Moderate" and described as noticeable and distracting. Impossible to hide from other people. More frequent flare-ups. Still possible to adapt and function close to normal. It can be very annoying and may have occasional stronger flare-ups. With discipline, patients may get used to it and adapt.       When using our objective Pain Scale, levels between 6 and 10/10 are said to belong in an emergency room, as it progressively worsens from a 6/10, described as severely limiting, requiring emergency care not usually available at an outpatient pain management facility. At a 6/10 level, communication becomes difficult and requires great effort. Assistance to reach the emergency department may be required. Facial flushing and profuse sweating along with  potentially dangerous increases in heart rate and blood pressure will be evident. Effect on ADL: limits my daily activities, always tired from pain Timing: Constant Modifying factors: medication, laying down and heat BP: (!) 126/57  HR: 90  [name] was last  seen on 09/26/2018 for a procedure. During today's appointment we reviewed Jennifer Harmon's post-procedure results, as well as her outpatient medication regimen.  She admits that she is having more right knee pain.  She is status post total knee replacement.  Admits that she almost suffered a fall on "Sunday secondary to her knee..  She continues to have the lower back pain.  She admits that her last procedure only lasted for a few days.  She really wants to get better.  Further details on both, my assessment(s), as well as the proposed treatment plan, please see below.  Controlled Substance Pharmacotherapy Assessment REMS (Risk Evaluation and Mitigation Strategy)  Analgesic:Oxycodone IR 5 mg twice daily MME/day:15 mg/day. Brown, Sheila A, RN  09/27/2018 10:47 AM  Sign at close encounter Nursing Pain Medication Assessment:  Safety precautions to be maintained throughout the outpatient stay will include: orient to surroundings, keep bed in low position, maintain call bell within reach at all times, provide assistance with transfer out of bed and ambulation.  Medication Inspection Compliance: Pill count conducted under aseptic conditions, in front of the patient. Neither the pills nor the bottle was removed from the patient's sight at any time. Once count was completed pills were immediately returned to the patient in their original bottle.  Medication: Oxycodone IR Pill/Patch Count: 14 of 60 pills remain Pill/Patch Appearance: Markings consistent with prescribed medication Bottle Appearance: Standard pharmacy container. Clearly labeled. Filled Date: 10 / 29 / 2019 Last Medication intake:  Yesterday   Pharmacokinetics: Liberation and  absorption (onset of action): WNL Distribution (time to peak effect): WNL Metabolism and excretion (duration of action): WNL         Pharmacodynamics: Desired effects: Analgesia: Ms. Coen reports >50% benefit. Functional ability: Patient reports that medication allows her to accomplish basic ADLs Clinically meaningful improvement in function (CMIF): Sustained CMIF goals met Perceived effectiveness: Described as relatively effective, allowing for increase in activities of daily living (ADL) Undesirable effects: Side-effects or Adverse reactions: None reported Monitoring: [place] PMP: Online review of the past 12-month period conducted. Compliant with practice rules and regulations Last UDS on record: Summary  Date Value Ref Range Status  05/16/2018 FINAL  Final    Comment:    ==================================================================== TOXASSURE SELECT 13 (MW) ==================================================================== Test                             Result       Flag       Units Drug Present and Declared for Prescription Verification   7-aminoclonazepam              552          EXPECTED   ng/mg creat    7-aminoclonazepam is an expected metabolite of clonazepam. Source    of clonazepam is a scheduled prescription medication.   Hydrocodone                    539          EXPECTED   ng/mg creat   Hydromorphone                  46           EXPECTED   ng/mg creat   Dihydrocodeine                 12" 4          EXPECTED   ng/mg creat  Norhydrocodone                 681          EXPECTED   ng/mg creat    Sources of hydrocodone include scheduled prescription    medications. Hydromorphone, dihydrocodeine and norhydrocodone are    expected metabolites of hydrocodone. Hydromorphone and    dihydrocodeine are also available as scheduled prescription    medications.   Oxycodone                      65           EXPECTED   ng/mg creat   Oxymorphone                    84            EXPECTED   ng/mg creat    Sources of oxycodone include scheduled prescription medications.    Oxymorphone is an expected metabolite of oxycodone. Oxymorphone    is also available as a scheduled prescription medication. ==================================================================== Test                      Result    Flag   Units      Ref Range   Creatinine              122              mg/dL      >=20 ==================================================================== Declared Medications:  The flagging and interpretation on this report are based on the  following declared medications.  Unexpected results may arise from  inaccuracies in the declared medications.  **Note: The testing scope of this panel includes these medications:  Clonazepam  Hydrocodone (Hydrocodone-Acetaminophen)  Oxycodone (Oxy-IR)  **Note: The testing scope of this panel does not include following  reported medications:  Acetaminophen (Hydrocodone-Acetaminophen)  Citalopram  Cyclobenzaprine  Latanoprost  Magnesium Oxide  Pregabalin  Ranitidine ==================================================================== For clinical consultation, please call [phone]. ====================================================================    UDS interpretation: Compliant          Medication Assessment Form: Reviewed. Patient indicates being compliant with therapy Treatment compliance: Compliant Risk Assessment Profile: Aberrant behavior: See prior evaluations. None observed or detected today Comorbid factors increasing risk of overdose: See prior notes. No additional risks detected today Opioid risk tool (ORT) (Total Score): 0 Personal History of Substance Abuse (SUD-Substance use disorder):  Alcohol: Negative  Illegal Drugs: Negative  Rx Drugs: Negative  ORT Risk Level calculation: Low Risk Risk of substance use disorder (SUD): Low Opioid Risk Tool - 09/27/18 1054      Family History of Substance  Abuse   Alcohol  Negative    Illegal Drugs  Negative    Rx Drugs  Negative      Personal History of Substance Abuse   Alcohol  Negative    Illegal Drugs  Negative    Rx Drugs  Negative      Age   Age between 72-45 years   No      History of Preadolescent Sexual Abuse   History of Preadolescent Sexual Abuse  Negative or Female      Psychological Disease   Psychological Disease  Negative    Depression  Negative      Total Score   Opioid Risk Tool Scoring  0    Opioid Risk Interpretation  Low Risk      ORT Scoring interpretation table:  Score <3 =  Low Risk for SUD  Score between 4-7 = Moderate Risk for SUD  Score >8 = High Risk for Opioid Abuse   Risk Mitigation Strategies:  Patient Counseling: Covered Patient-Prescriber Agreement (PPA): Present and active  Notification to other healthcare providers: Done  Pharmacologic Plan: No change in therapy, at this time.             Post-Procedure Assessment  08/16/2018 procedure: Left sided lumbar facet radiofrequency ablation Pre-procedure pain score:  7/10 Post-procedure pain score: 1/10         Influential Factors: BMI: 35.61 kg/m Intra-procedural challenges: None observed.         Assessment challenges: None detected.              Reported side-effects: None.        Post-procedural adverse reactions or complications: None reported         Sedation: Please see nurses note. When no sedatives are used, the analgesic levels obtained are directly associated to the effectiveness of the local anesthetics. However, when sedation is provided, the level of analgesia obtained during the initial 1 hour following the intervention, is believed to be the result of a combination of factors. These factors may include, but are not limited to: 1. The effectiveness of the local anesthetics used. 2. The effects of the analgesic(s) and/or anxiolytic(s) used. 3. The degree of discomfort experienced by the patient at the time of the procedure. 4.  The patients ability and reliability in recalling and recording the events. 5. The presence and influence of possible secondary gains and/or psychosocial factors. Reported result: Relief experienced during the 1st hour after the procedure: 100 % (Ultra-Short Term Relief)            Interpretative annotation: Clinically appropriate result. Analgesia during this period is likely to be Local Anesthetic and/or IV Sedative (Analgesic/Anxiolytic) related.          Effects of local anesthetic: The analgesic effects attained during this period are directly associated to the localized infiltration of local anesthetics and therefore cary significant diagnostic value as to the etiological location, or anatomical origin, of the pain. Expected duration of relief is directly dependent on the pharmacodynamics of the local anesthetic used. Long-acting (4-6 hours) anesthetics used.  Reported result: Relief during the next 4 to 6 hour after the procedure: 100 % (Short-Term Relief)            Interpretative annotation: Clinically appropriate result. Analgesia during this period is likely to be Local Anesthetic-related.          Long-term benefit: Defined as the period of time past the expected duration of local anesthetics (1 hour for short-acting and 4-6 hours for long-acting). With the possible exception of prolonged sympathetic blockade from the local anesthetics, benefits during this period are typically attributed to, or associated with, other factors such as analgesic sensory neuropraxia, antiinflammatory effects, or beneficial biochemical changes provided by agents other than the local anesthetics.  Reported result: Extended relief following procedure: 100 %(4 days) (Long-Term Relief)            Interpretative annotation: Clinically possible results. Good relief. No permanent benefit expected. Inflammation plays a part in the etiology to the pain.          Current benefits: Defined as reported results that  persistent at this point in time.   Analgesia: <50 % Ms. Mclane reports improvement of axial symptoms. for 4 days only Function: Back to baseline ROM: Back to baseline Interpretative annotation:  Recurrence of symptoms. Limited therapeutic benefit. Results would suggest further treatment needed.          Interpretation: Results would suggest failure of therapy in achieving desired goal(s).                  Plan:  Please see "Plan of Care" for details.                Laboratory Chemistry  Inflammation Markers (CRP: Acute Phase) (ESR: Chronic Phase) Lab Results  Component Value Date   CRP <0.8 09/06/2017   ESRSEDRATE 18 09/06/2017                         Rheumatology Markers Lab Results  Component Value Date   RF <14 07/03/2017                        Renal Function Markers Lab Results  Component Value Date   BUN 16 09/06/2017   CREATININE 0.67 09/06/2017   GFRAA >60 09/06/2017   GFRNONAA >60 09/06/2017                             Hepatic Function Markers Lab Results  Component Value Date   AST 26 09/06/2017   ALT 31 09/06/2017   ALBUMIN 4.8 09/06/2017   ALKPHOS 100 09/06/2017                        Electrolytes Lab Results  Component Value Date   NA 140 09/06/2017   K 4.0 09/06/2017   CL 104 09/06/2017   CALCIUM 10.0 09/06/2017   MG 2.0 07/03/2017                        Neuropathy Markers Lab Results  Component Value Date   [id number] 588 09/06/2017                        CNS Tests No results found for: COLORCSF, APPEARCSF, RBCCOUNTCSF, WBCCSF, POLYSCSF, LYMPHSCSF, EOSCSF, PROTEINCSF, GLUCCSF, JCVIRUS, CSFOLI, IGGCSF                      Bone Pathology Markers No results found for: VD25OH, VD125OH2TOT, [id number], [id number], 25OHVITD1, 25OHVITD2, 25OHVITD3, TESTOFREE, TESTOSTERONE                       Coagulation Parameters Lab Results  Component Value Date   PLT 197 07/03/2017                        Cardiovascular Markers Lab Results  Component  Value Date   CKTOTAL 130 07/03/2017   HGB 14.0 07/03/2017   HCT 43.3 07/03/2017                         CA Markers No results found for: CEA, CA125, LABCA2                      Note: Lab results reviewed.  Recent Diagnostic Imaging Results  DG C-Arm 1-60 Min-No Report Fluoroscopy was utilized by the requesting physician.  No radiographic  interpretation.   Complexity Note: Imaging results reviewed. Results shared with Ms. Pensyl, using Layman's terms.  Meds   Current Outpatient Medications:  .  citalopram (CELEXA) 20 MG tablet, Take 20 mg by mouth daily.  , Disp: , Rfl:  .  clonazePAM (KLONOPIN) 1 MG tablet, Take 1 mg by mouth daily. , Disp: , Rfl:  .  latanoprost (XALATAN) 0.005 % ophthalmic solution, Place 1 drop into both eyes daily., Disp: , Rfl:  .  ranitidine (ZANTAC) 150 MG tablet, Take 150 mg by mouth daily., Disp: , Rfl:  .  ranitidine (ZANTAC) 300 MG tablet, , Disp: , Rfl:  .  rosuvastatin (CRESTOR) 10 MG tablet, , Disp: , Rfl:  .  cyclobenzaprine (FLEXERIL) 10 MG tablet, Take 1 tablet (10 mg total) by mouth 2 (two) times daily as needed for muscle spasms., Disp: 60 tablet, Rfl: 2 .  [START ON 10/04/2018] LYRICA 100 MG capsule, Take 1 capsule (100 mg total) by mouth 2 (two) times daily., Disp: 60 capsule, Rfl: 2 .  [START ON 10/04/2018] Magnesium Oxide 500 MG CAPS, Take 1 capsule (500 mg total) by mouth 2 (two) times daily at 8 am and 10 pm., Disp: 60 capsule, Rfl: 2 .  [START ON 12/03/2018] oxyCODONE (OXY IR/ROXICODONE) 5 MG immediate release tablet, Take 1 tablet (5 mg total) by mouth 2 (two) times daily as needed for severe pain., Disp: 60 tablet, Rfl: 0 .  [START ON 11/03/2018] oxyCODONE (OXY IR/ROXICODONE) 5 MG immediate release tablet, Take 1 tablet (5 mg total) by mouth 2 (two) times daily as needed for severe pain., Disp: 60 tablet, Rfl: 0 .  [START ON 10/04/2018] oxyCODONE (OXY IR/ROXICODONE) 5 MG immediate release tablet, Take 1 tablet (5 mg  total) by mouth 2 (two) times daily as needed for severe pain., Disp: 60 tablet, Rfl: 0  ROS  Constitutional: Denies any fever or chills Gastrointestinal: No reported hemesis, hematochezia, vomiting, or acute GI distress Musculoskeletal: Denies any acute onset joint swelling, redness, loss of ROM, or weakness Neurological: No reported episodes of acute onset apraxia, aphasia, dysarthria, agnosia, amnesia, paralysis, loss of coordination, or loss of consciousness  Allergies  Ms. Lisby is allergic to asa [aspirin]; bupropion; and tape.  [place]  Drug: Ms. Schey  reports that she does not use drugs. Alcohol:  reports that she does not drink alcohol. Tobacco:  reports that she quit smoking about 2 years ago. Her smoking use included pipe. She has a 10.00 pack-year smoking history. She uses smokeless tobacco. Medical:  has a past medical history of Anxiety, Chronic chest pain, Depression, DJD (degenerative joint disease), Fibromyalgia, GERD (gastroesophageal reflux disease), and Hypercholesterolemia. Surgical: Ms. Noack  has a past surgical history that includes Cholecystectomy; Cesarean section; and Back surgery (2008). Family: family history includes Arthritis in her father; Hypertension in her mother.  Constitutional Exam  General appearance: Well nourished, well developed, and well hydrated. In no apparent acute distress Vitals:   09/27/18 1047  BP: (!) 126/57  Pulse: 90  Temp: 97.7 F (36.5 C)  SpO2: 100%  Weight: 201 lb (91.2 kg)  Height: '5\' 3"'  (1.6 m)  Psych/Mental status: Alert, oriented x 3 (person, place, & time)       Eyes: PERLA Respiratory: No evidence of acute respiratory distress  Lumbar Spine Area Exam  Skin & Axial Inspection: No masses, redness, or swelling Alignment: Symmetrical Functional ROM: Unrestricted ROM       Stability: No instability detected Muscle Tone/Strength: Functionally intact. No obvious neuro-muscular anomalies detected. Sensory  (Neurological): Unimpaired Palpation: No palpable anomalies       Provocative Tests:  Hyperextension/rotation test: Positive bilaterally for facet joint pain.   Gait & Posture Assessment  Ambulation: Unassisted Gait: Relatively normal for age and body habitus Posture: WNL   Lower Extremity Exam    Side: Right lower extremity  Side: Left lower extremity  Stability: No instability observed          Stability: No instability observed          Skin & Extremity Inspection: Evidence of prior arthroplastic surgery  Skin & Extremity Inspection: Evidence of prior arthroplastic surgery  Functional ROM: Unrestricted ROM                  Functional ROM: Decreased ROM                  Muscle Tone/Strength: Functionally intact. No obvious neuro-muscular anomalies detected.  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 Diagnoses of Fibromyalgia, Chronic knee pain (Fourth Area of Pain) (Bilateral) (R>L), Chronic musculoskeletal pain, Osteoarthritis of knee (Right), Neurogenic pain, Muscle spasms of both lower extremities, and Chronic pain syndrome were also pertinent to this visit.  Status Diagnosis  Persistent Persistent Worsening 1. Spondylosis without myelopathy or radiculopathy, lumbosacral region   2. Fibromyalgia   3. Chronic knee pain (Fourth Area of Pain) (Bilateral) (R>L)   4. Chronic musculoskeletal pain   5. Osteoarthritis of knee (Right)   6. Neurogenic pain   7. Muscle spasms of both lower extremities   8. Chronic pain syndrome     Problems updated and reviewed during this visit: No problems updated. Plan of Care  Pharmacotherapy (Medications Ordered): Meds ordered this encounter  Medications   . cyclobenzaprine (FLEXERIL) 10 MG tablet    Sig: Take 1 tablet (10 mg total) by mouth 2 (two) times daily as needed for muscle spasms.    Dispense:  60 tablet    Refill:  2    Do not place medication on "Automatic Refill". Fill one day early if pharmacy is closed on scheduled refill date.    Order Specific Question:   Supervising Provider    Answer:   Milinda Pointer [phone]  . LYRICA 100 MG capsule    Sig: Take 1 capsule (100 mg total) by mouth 2 (two) times daily.    Dispense:  60 capsule    Refill:  2    Do not place this medication, or any other prescription from our practice, on "Automatic Refill". Patient may have prescription filled one day early if pharmacy is closed on scheduled refill date.    Order Specific Question:   Supervising Provider    Answer:   Milinda Pointer [phone]  . Magnesium Oxide 500 MG CAPS    Sig: Take 1 capsule (500 mg total) by mouth 2 (two) times daily at 8 am and 10 pm.    Dispense:  60 capsule    Refill:  2    Do not place medication on "Automatic Refill". Fill one day early if pharmacy is closed on scheduled refill date.    Order Specific Question:   Supervising Provider    Answer:   Milinda Pointer [phone]  . oxyCODONE (OXY IR/ROXICODONE) 5 MG immediate release tablet    Sig: Take 1 tablet (5  mg total) by mouth 2 (two) times daily as needed for severe pain.    Dispense:  60 tablet    Refill:  0    Do not place this medication, or any other prescription from our practice, on "Automatic Refill". Patient may have prescription filled one day early if pharmacy is closed on scheduled refill date.    Order Specific Question:   Supervising Provider    Answer:   Milinda Pointer [phone]  . oxyCODONE (OXY IR/ROXICODONE) 5 MG immediate release tablet    Sig: Take 1 tablet (5 mg total) by mouth 2 (two) times daily as needed for severe pain.    Dispense:  60 tablet    Refill:  0    Do not place this medication, or any other prescription from our  practice, on "Automatic Refill". Patient may have prescription filled one day early if pharmacy is closed on scheduled refill date.    Order Specific Question:   Supervising Provider    Answer:   Milinda Pointer [phone]  . oxyCODONE (OXY IR/ROXICODONE) 5 MG immediate release tablet    Sig: Take 1 tablet (5 mg total) by mouth 2 (two) times daily as needed for severe pain.    Dispense:  60 tablet    Refill:  0    Do not place this medication, or any other prescription from our practice, on "Automatic Refill". Patient may have prescription filled one day early if pharmacy is closed on scheduled refill date.    Order Specific Question:   Supervising Provider    Answer:   Milinda Pointer [[id number]]   New Prescriptions   No medications on file   Medications administered today: [place]um[place] Kallio. Erker had no medications administered during this visit. Lab-work, procedure(s), and/or referral(s): Orders Placed This Encounter  Procedures  . Ambulatory referral to Orthopedic Surgery   Imaging and/or referral(s): AMB REFERRAL TO ORTHOPEDIC SURGERY  Interventional management options: Planned, scheduled, and/or pending: Not at this time.   Considering: Diagnostic Trigger point injections Diagnostic bilateral LESI Diagnostic bilateral lumbar facet nerve block #3 Possiblebilateral lumbar facet RFA Possiblebilateral Racz(Epidural Neurolysis) Diagnostic thoracic facet nerve block Possible thoracic facet RFA Diagnostic bilateral CESI Diagnosticbilateral cervical facet nerve block Possiblebilateral cervical facet RFA Diagnostic right shoulder intra-articular injection Diagnostic right suprascapular nerve block Possibleright suprascapular RFA Diagnostic bilateral genicular nerve block Possible bilateral genicular RFA   Palliative PRN treatment(s): None at this time    Provider-requested follow-up: Return in about 3 months (around 12/28/2018) for  MedMgmt.  Future Appointments  Date Time Provider [place]  10/01/2018  9:30 AM Bo Merino, MD [facility] None  12/24/2018  9:30 AM Vevelyn Francois, NP [facility] None   Primary Care Physician: Glenda Chroman, MD Location: [facility] Note by: Vevelyn Francois NP Date: 09/27/2018; Time: 1:41 PM  Pain Score Disclaimer: We use the NRS-11 scale. This is a self-reported, subjective measurement of pain severity with only modest accuracy. It is used primarily to identify changes within a particular patient. It must be understood that outpatient pain scales are significantly less accurate that those used for research, where they can be applied under ideal controlled circumstances with minimal exposure to variables. In reality, the score is likely to be a combination of pain intensity and pain affect, where pain affect describes the degree of emotional arousal or changes in action readiness caused by the sensory experience of pain. Factors such as social and work situation, setting, emotional state, anxiety levels, expectation, and  prior pain experience may influence pain perception and show large inter-individual differences that may also be affected by time variables.  Patient instructions provided during this appointment: Patient Instructions   ____________________________________________________________________________________________  Medication Rules  Purpose: To inform patients, and their family members, of our rules and regulations.  Applies to: All patients receiving prescriptions (written or electronic).  Pharmacy of record: Pharmacy where electronic prescriptions will be sent. If written prescriptions are taken to a different pharmacy, please inform the nursing staff. The pharmacy listed in the electronic medical record should be the one where you would like electronic prescriptions to be sent.  Electronic prescriptions: In compliance with the [place] (STOP) Act of 2017 (Session Lanny Cramp [phone]), effective November 07, 2018, all controlled substances must be electronically prescribed. Calling prescriptions to the pharmacy will cease to exist.  Prescription refills: Only during scheduled appointments. Applies to all prescriptions.  NOTE: The following applies primarily to controlled substances (Opioid* Pain Medications).   Patient's responsibilities: 1. Pain Pills: Bring all pain pills to every appointment (except for procedure appointments). 2. Pill Bottles: Bring pills in original pharmacy bottle. Always bring the newest bottle. Bring bottle, even if empty. 3. Medication refills: You are responsible for knowing and keeping track of what medications you take and those you need refilled. The day before your appointment: write a list of all prescriptions that need to be refilled. The day of the appointment: give the list to the admitting nurse. Prescriptions will be written only during appointments. If you forget a medication: it will not be "Called in", "Faxed", or "electronically sent". You will need to get another appointment to get these prescribed. No early refills. Do not call asking to have your prescription filled early. 4. Prescription Accuracy: You are responsible for carefully inspecting your prescriptions before leaving our office. Have the discharge nurse carefully go over each prescription with you, before taking them home. Make sure that your name is accurately spelled, that your address is correct. Check the name and dose of your medication to make sure it is accurate. Check the number of pills, and the written instructions to make sure they are clear and accurate. Make sure that you are given enough medication to last until your next medication refill appointment. 5. Taking Medication: Take medication as prescribed. When it comes to controlled substances, taking less pills or less  frequently than prescribed is permitted and encouraged. Never take more pills than instructed. Never take medication more frequently than prescribed.  6. Inform other Doctors: Always inform, all of your healthcare providers, of all the medications you take. 7. Pain Medication from other Providers: You are not allowed to accept any additional pain medication from any other Doctor or Healthcare provider. There are two exceptions to this rule. (see below) In the event that you require additional pain medication, you are responsible for notifying us, as stated below. 8. Medication Agreement: You are responsible for carefully reading and following our Medication Agreement. This must be signed before receiving any prescriptions from our practice. Safely store a copy of your signed Agreement. Violations to the Agreement will result in no further prescriptions. (Additional copies of our Medication Agreement are available upon request.) 9. Laws, Rules, & Regulations: All patients are expected to follow all Federal and [employer], [employer], Rules, [employer]. Ignorance of the Laws does not constitute a valid excuse. The use of any illegal substances is prohibited. 10. Adopted CDC guidelines & recommendations: Target dosing levels will be  at or below 60 MME/day. Use of benzodiazepines** is not recommended.  Exceptions: There are only two exceptions to the rule of not receiving pain medications from other Healthcare Providers. 1. Exception #1 (Emergencies): In the event of an emergency (i.e.: accident requiring emergency care), you are allowed to receive additional pain medication. However, you are responsible for: As soon as you are able, call our office [phone], at any time of the day or night, and leave a message stating your name, the date and nature of the emergency, and the name and dose of the medication prescribed. In the event that your call is answered by a member of our staff, make sure to  document and save the date, time, and the name of the person that took your information.  2. Exception #2 (Planned Surgery): In the event that you are scheduled by another doctor or dentist to have any type of surgery or procedure, you are allowed (for a period no longer than 30 days), to receive additional pain medication, for the acute post-op pain. However, in this case, you are responsible for picking up a copy of our "Post-op Pain Management for Surgeons" handout, and giving it to your surgeon or dentist. This document is available at our office, and does not require an appointment to obtain it. Simply go to our office during business hours (Monday-Thursday from 8:00 AM to 4:00 PM) (Friday 8:00 AM to 12:00 Noon) or if you have a scheduled appointment with [place], prior to your surgery, and ask for it by name. In addition, you will need to provide [place] with your name, name of your surgeon, type of surgery, and date of procedure or surgery.  *Opioid medications include: morphine, codeine, oxycodone, oxymorphone, hydrocodone, hydromorphone, meperidine, tramadol, tapentadol, buprenorphine, fentanyl, methadone. **Benzodiazepine medications include: diazepam (Valium), alprazolam (Xanax), clonazepam (Klonopine), lorazepam (Ativan), clorazepate (Tranxene), chlordiazepoxide (Librium), estazolam (Prosom), oxazepam (Serax), temazepam (Restoril), triazolam (Halcion) (Last updated: 01/04/2018) ____________________________________________________________________________________________    ______________________________________________________________________________________________  Specialty Pain Scale  Introduction:  There are significant differences in how pain is reported. The word pain usually refers to physical pain, but it is also a common synonym of suffering. The medical community uses a scale from 0 (zero) to 10 (ten) to report pain level. Zero (0) is described as "no pain", while ten (10) is described as  "the worse pain you can imagine". The problem with this scale is that physical pain is reported along with suffering. Suffering refers to mental pain, or more often yet it refers to any unpleasant feeling, emotion or aversion associated with the perception of harm or threat of harm. It is the psychological component of pain.  Pain Specialists prefer to separate the two components. The pain scale used by this practice is the Verbal Numerical Rating Scale (VNRS-11). This scale is for the physical pain only. DO NOT INCLUDE how your pain psychologically affects you. This scale is for adults 73 years of age and older. It has 11 (eleven) levels. The 1st level is 0/10. This means: "right now, I have no pain". In the context of pain management, it also means: "right now, my physical pain is under control with the current therapy".  General Information:  The scale should reflect your current level of pain. Unless you are specifically asked for the level of your worst pain, or your average pain. If you are asked for one of these two, then it should be understood that it is over the past 24 hours.  Levels 1 (one) through  5 (five) are described below, and can be treated as an outpatient. Ambulatory pain management facilities such as ours are more than adequate to treat these levels. Levels 6 (six) through 10 (ten) are also described below, however, these must be treated as a hospitalized patient. While levels 6 (six) and 7 (seven) may be evaluated at an urgent care facility, levels 8 (eight) through 10 (ten) constitute medical emergencies and as such, they belong in a hospital's emergency department. When having these levels (as described below), do not come to our office. Our facility is not equipped to manage these levels. Go directly to an urgent care facility or an emergency department to be evaluated.  Definitions:  Activities of Daily Living (ADL): Activities of daily living (ADL or ADLs) is a term used in  healthcare to refer to people's daily self-care activities. Health professionals often use a person's ability or inability to perform ADLs as a measurement of their functional status, particularly in regard to people post injury, with disabilities and the elderly. There are two ADL levels: Basic and Instrumental. Basic Activities of Daily Living (BADL  or BADLs) consist of self-care tasks that include: Bathing and showering; personal hygiene and grooming (including brushing/combing/styling hair); dressing; Toilet hygiene (getting to the toilet, cleaning oneself, and getting back up); eating and self-feeding (not including cooking or chewing and swallowing); functional mobility, often referred to as "transferring", as measured by the ability to walk, get in and out of bed, and get into and out of a chair; the broader definition (moving from one place to another while performing activities) is useful for people with different physical abilities who are still able to get around independently. Basic ADLs include the things many people do when they get up in the morning and get ready to go out of the house: get out of bed, go to the toilet, bathe, dress, groom, and eat. On the average, loss of function typically follows a particular order. Hygiene is the first to go, followed by loss of toilet use and locomotion. The last to go is the ability to eat. When there is only one remaining area in which the person is independent, there is a 62.9% chance that it is eating and only a 3.5% chance that it is hygiene. Instrumental Activities of Daily Living (IADL or IADLs) are not necessary for fundamental functioning, but they let an individual live independently in a community. IADL consist of tasks that include: cleaning and maintaining the house; home establishment and maintenance; care of others (including selecting and supervising caregivers); care of pets; child rearing; managing money; managing financials (investments,  etc.); meal preparation and cleanup; shopping for groceries and necessities; moving within the community; safety procedures and emergency responses; health management and maintenance (taking prescribed medications); and using the telephone or other form of communication.  Instructions:  Most patients tend to report their pain as a combination of two factors, their physical pain and their psychosocial pain. This last one is also known as "suffering" and it is reflection of how physical pain affects you socially and psychologically. From now on, report them separately.  From this point on, when asked to report your pain level, report only your physical pain. Use the following table for reference.  Pain Clinic Pain Levels (0-5/10)  Pain Level Score  Description  No Pain 0   Mild pain 1 Nagging, annoying, but does not interfere with basic activities of daily living (ADL). Patients are able to eat, bathe, get dressed, toileting (being  able to get on and off the toilet and perform personal hygiene functions), transfer (move in and out of bed or a chair without assistance), and maintain continence (able to control bladder and bowel functions). Blood pressure and heart rate are unaffected. A normal heart rate for a healthy adult ranges from 60 to 100 bpm (beats per minute).   Mild to moderate pain 2 Noticeable and distracting. Impossible to hide from other people. More frequent flare-ups. Still possible to adapt and function close to normal. It can be very annoying and may have occasional stronger flare-ups. With discipline, patients may get used to it and adapt.   Moderate pain 3 Interferes significantly with activities of daily living (ADL). It becomes difficult to feed, bathe, get dressed, get on and off the toilet or to perform personal hygiene functions. Difficult to get in and out of bed or a chair without assistance. Very distracting. With effort, it can be ignored when deeply involved in activities.    Moderately severe pain 4 Impossible to ignore for more than a few minutes. With effort, patients may still be able to manage work or participate in some social activities. Very difficult to concentrate. Signs of autonomic nervous system discharge are evident: dilated pupils (mydriasis); mild sweating (diaphoresis); sleep interference. Heart rate becomes elevated (>115 bpm). Diastolic blood pressure (lower number) rises above 100 mmHg. Patients find relief in laying down and not moving.   Severe pain 5 Intense and extremely unpleasant. Associated with frowning face and frequent crying. Pain overwhelms the senses.  Ability to do any activity or maintain social relationships becomes significantly limited. Conversation becomes difficult. Pacing back and forth is common, as getting into a comfortable position is nearly impossible. Pain wakes you up from deep sleep. Physical signs will be obvious: pupillary dilation; increased sweating; goosebumps; brisk reflexes; cold, clammy hands and feet; nausea, vomiting or dry heaves; loss of appetite; significant sleep disturbance with inability to fall asleep or to remain asleep. When persistent, significant weight loss is observed due to the complete loss of appetite and sleep deprivation.  Blood pressure and heart rate becomes significantly elevated. Caution: If elevated blood pressure triggers a pounding headache associated with blurred vision, then the patient should immediately seek attention at an urgent or emergency care unit, as these may be signs of an impending stroke.    Emergency Department Pain Levels (6-10/10)  Emergency Room Pain 6 Severely limiting. Requires emergency care and should not be seen or managed at an outpatient pain management facility. Communication becomes difficult and requires great effort. Assistance to reach the emergency department may be required. Facial flushing and profuse sweating along with potentially dangerous increases in heart  rate and blood pressure will be evident.   Distressing pain 7 Self-care is very difficult. Assistance is required to transport, or use restroom. Assistance to reach the emergency department will be required. Tasks requiring coordination, such as bathing and getting dressed become very difficult.   Disabling pain 8 Self-care is no longer possible. At this level, pain is disabling. The individual is unable to do even the most "basic" activities such as walking, eating, bathing, dressing, transferring to a bed, or toileting. Fine motor skills are lost. It is difficult to think clearly.   Incapacitating pain 9 Pain becomes incapacitating. Thought processing is no longer possible. Difficult to remember your own name. Control of movement and coordination are lost.   The worst pain imaginable 10 At this level, most patients pass out from pain. When this  level is reached, collapse of the autonomic nervous system occurs, leading to a sudden drop in blood pressure and heart rate. This in turn results in a temporary and dramatic drop in blood flow to the brain, leading to a loss of consciousness. Fainting is one of the body's self defense mechanisms. Passing out puts the brain in a calmed state and causes it to shut down for a while, in order to begin the healing process.    Summary: 1. Refer to this scale when providing [place] with your pain level. 2. Be accurate and careful when reporting your pain level. This will help with your care. 3. Over-reporting your pain level will lead to loss of credibility. 4. Even a level of 1/10 means that there is pain and will be treated at our facility. 5. High, inaccurate reporting will be documented as "Symptom Exaggeration", leading to loss of credibility and suspicions of possible secondary gains such as obtaining more narcotics, or wanting to appear disabled, for fraudulent reasons. 6. Only pain levels of 5 or below will be seen at our facility. 7. Pain levels of 6 and  above will be sent to the Emergency Department and the appointment cancelled. ______________________________________________________________________________________________    BMI Assessment: Estimated body mass index is 35.61 kg/m as calculated from the following:   Height as of this encounter: '5\' 3"'  (1.6 m).   Weight as of this encounter: 201 lb (91.2 kg).  BMI interpretation table: BMI level Category Range association with higher incidence of chronic pain  <18 kg/m2 Underweight   18.5-24.9 kg/m2 Ideal body weight   25-29.9 kg/m2 Overweight Increased incidence by 20%  30-34.9 kg/m2 Obese (Class I) Increased incidence by 68%  35-39.9 kg/m2 Severe obesity (Class II) Increased incidence by 136%  >40 kg/m2 Extreme obesity (Class III) Increased incidence by 254%   Patient's current BMI Ideal Body weight  Body mass index is 35.61 kg/m. Ideal body weight: 52.4 kg (115 lb 8.3 oz) Adjusted ideal body weight: 67.9 kg (149 lb 11.4 oz)   BMI Readings from Last 4 Encounters:  09/27/18 35.61 kg/m  08/16/18 35.61 kg/m  06/18/18 35.96 kg/m  05/16/18 36.49 kg/m   Wt Readings from Last 4 Encounters:  09/27/18 201 lb (91.2 kg)  08/16/18 201 lb (91.2 kg)  06/18/18 203 lb (92.1 kg)  05/16/18 206 lb (93.4 kg)

## 2018-09-27 NOTE — Progress Notes (Signed)
Nursing Pain Medication Assessment:  Safety precautions to be maintained throughout the outpatient stay will include: orient to surroundings, keep bed in low position, maintain call bell within reach at all times, provide assistance with transfer out of bed and ambulation.  Medication Inspection Compliance: Pill count conducted under aseptic conditions, in front of the patient. Neither the pills nor the bottle was removed from the patient's sight at any time. Once count was completed pills were immediately returned to the patient in their original bottle.  Medication: Oxycodone IR Pill/Patch Count: 14 of 60 pills remain Pill/Patch Appearance: Markings consistent with prescribed medication Bottle Appearance: Standard pharmacy container. Clearly labeled. Filled Date: 10 / 29 / 2019 Last Medication intake:  Yesterday

## 2018-10-01 ENCOUNTER — Ambulatory Visit: Payer: PPO | Admitting: Rheumatology

## 2018-11-08 DIAGNOSIS — I10 Essential (primary) hypertension: Secondary | ICD-10-CM | POA: Diagnosis not present

## 2018-11-08 DIAGNOSIS — F419 Anxiety disorder, unspecified: Secondary | ICD-10-CM | POA: Diagnosis not present

## 2018-11-08 DIAGNOSIS — J069 Acute upper respiratory infection, unspecified: Secondary | ICD-10-CM | POA: Diagnosis not present

## 2018-11-08 DIAGNOSIS — M1711 Unilateral primary osteoarthritis, right knee: Secondary | ICD-10-CM | POA: Diagnosis not present

## 2018-11-08 DIAGNOSIS — Z299 Encounter for prophylactic measures, unspecified: Secondary | ICD-10-CM | POA: Diagnosis not present

## 2018-11-08 DIAGNOSIS — Z6834 Body mass index (BMI) 34.0-34.9, adult: Secondary | ICD-10-CM | POA: Diagnosis not present

## 2018-11-09 ENCOUNTER — Other Ambulatory Visit: Payer: Self-pay | Admitting: Pain Medicine

## 2018-11-09 DIAGNOSIS — M792 Neuralgia and neuritis, unspecified: Secondary | ICD-10-CM

## 2018-11-09 DIAGNOSIS — M797 Fibromyalgia: Secondary | ICD-10-CM

## 2018-11-27 DIAGNOSIS — Z299 Encounter for prophylactic measures, unspecified: Secondary | ICD-10-CM | POA: Diagnosis not present

## 2018-11-27 DIAGNOSIS — J069 Acute upper respiratory infection, unspecified: Secondary | ICD-10-CM | POA: Diagnosis not present

## 2018-11-27 DIAGNOSIS — I10 Essential (primary) hypertension: Secondary | ICD-10-CM | POA: Diagnosis not present

## 2018-11-27 DIAGNOSIS — E785 Hyperlipidemia, unspecified: Secondary | ICD-10-CM | POA: Diagnosis not present

## 2018-11-27 DIAGNOSIS — F419 Anxiety disorder, unspecified: Secondary | ICD-10-CM | POA: Diagnosis not present

## 2018-11-27 DIAGNOSIS — Z6831 Body mass index (BMI) 31.0-31.9, adult: Secondary | ICD-10-CM | POA: Diagnosis not present

## 2018-12-10 ENCOUNTER — Telehealth: Payer: Self-pay | Admitting: Nurse Practitioner

## 2018-12-10 DIAGNOSIS — Z96659 Presence of unspecified artificial knee joint: Secondary | ICD-10-CM | POA: Diagnosis not present

## 2018-12-10 DIAGNOSIS — M76891 Other specified enthesopathies of right lower limb, excluding foot: Secondary | ICD-10-CM | POA: Diagnosis not present

## 2018-12-10 DIAGNOSIS — T8484XA Pain due to internal orthopedic prosthetic devices, implants and grafts, initial encounter: Secondary | ICD-10-CM | POA: Diagnosis not present

## 2018-12-10 DIAGNOSIS — Z96651 Presence of right artificial knee joint: Secondary | ICD-10-CM | POA: Diagnosis not present

## 2018-12-10 DIAGNOSIS — T8484XD Pain due to internal orthopedic prosthetic devices, implants and grafts, subsequent encounter: Secondary | ICD-10-CM | POA: Diagnosis not present

## 2018-12-10 DIAGNOSIS — G8929 Other chronic pain: Secondary | ICD-10-CM | POA: Diagnosis not present

## 2018-12-10 DIAGNOSIS — Z886 Allergy status to analgesic agent status: Secondary | ICD-10-CM | POA: Diagnosis not present

## 2018-12-10 DIAGNOSIS — Z471 Aftercare following joint replacement surgery: Secondary | ICD-10-CM | POA: Diagnosis not present

## 2018-12-10 NOTE — Telephone Encounter (Signed)
Jennifer Harmon,         This patient will need to come in for evaluation with Dr. Laban Emperor to discuss the referral and to document for insurance purposes.

## 2018-12-10 NOTE — Telephone Encounter (Signed)
Pt called and stated that Crystal referred her to a knee doctor and that doctor recommends knee injections and wants her to have them done here. Pt would like to know if we could go ahead and schedule this or if she needs to be seen again by Crystal or Dr. Dorris CarnesN before?

## 2018-12-21 DIAGNOSIS — Z96651 Presence of right artificial knee joint: Secondary | ICD-10-CM | POA: Diagnosis not present

## 2018-12-21 DIAGNOSIS — T8484XD Pain due to internal orthopedic prosthetic devices, implants and grafts, subsequent encounter: Secondary | ICD-10-CM | POA: Diagnosis not present

## 2018-12-24 ENCOUNTER — Other Ambulatory Visit: Payer: Self-pay

## 2018-12-24 ENCOUNTER — Ambulatory Visit: Payer: PPO | Attending: Nurse Practitioner | Admitting: Nurse Practitioner

## 2018-12-24 ENCOUNTER — Telehealth: Payer: Self-pay | Admitting: Nurse Practitioner

## 2018-12-24 ENCOUNTER — Encounter: Payer: Self-pay | Admitting: Nurse Practitioner

## 2018-12-24 VITALS — BP 114/58 | HR 91 | Temp 97.7°F | Ht 63.0 in | Wt 201.0 lb

## 2018-12-24 DIAGNOSIS — M792 Neuralgia and neuritis, unspecified: Secondary | ICD-10-CM | POA: Diagnosis not present

## 2018-12-24 DIAGNOSIS — M62838 Other muscle spasm: Secondary | ICD-10-CM | POA: Diagnosis not present

## 2018-12-24 DIAGNOSIS — M797 Fibromyalgia: Secondary | ICD-10-CM | POA: Diagnosis not present

## 2018-12-24 DIAGNOSIS — G894 Chronic pain syndrome: Secondary | ICD-10-CM | POA: Diagnosis not present

## 2018-12-24 DIAGNOSIS — G8929 Other chronic pain: Secondary | ICD-10-CM | POA: Insufficient documentation

## 2018-12-24 DIAGNOSIS — Z96651 Presence of right artificial knee joint: Secondary | ICD-10-CM | POA: Diagnosis not present

## 2018-12-24 DIAGNOSIS — M1711 Unilateral primary osteoarthritis, right knee: Secondary | ICD-10-CM | POA: Insufficient documentation

## 2018-12-24 DIAGNOSIS — M47817 Spondylosis without myelopathy or radiculopathy, lumbosacral region: Secondary | ICD-10-CM

## 2018-12-24 DIAGNOSIS — M7918 Myalgia, other site: Secondary | ICD-10-CM | POA: Diagnosis not present

## 2018-12-24 DIAGNOSIS — Z79891 Long term (current) use of opiate analgesic: Secondary | ICD-10-CM | POA: Insufficient documentation

## 2018-12-24 MED ORDER — DICLOFENAC SODIUM 1 % TD GEL: 4.0000 g | Freq: Four times a day (QID) | TRANSDERMAL | 2 refills | Status: AC

## 2018-12-24 MED ORDER — LYRICA 100 MG PO CAPS: 100.0000 mg | ORAL_CAPSULE | Freq: Two times a day (BID) | ORAL | 2 refills | Status: DC

## 2018-12-24 MED ORDER — CYCLOBENZAPRINE HCL 10 MG PO TABS: 10.0000 mg | ORAL_TABLET | Freq: Two times a day (BID) | ORAL | 2 refills | Status: DC | PRN

## 2018-12-24 MED ORDER — MAGNESIUM OXIDE -MG SUPPLEMENT 500 MG PO CAPS: 1.0000 | ORAL_CAPSULE | Freq: Two times a day (BID) | ORAL | 2 refills | Status: DC

## 2018-12-24 MED ORDER — OXYCODONE HCL 5 MG PO TABS: 5.0000 mg | ORAL_TABLET | Freq: Two times a day (BID) | ORAL | 0 refills | Status: DC | PRN

## 2018-12-24 NOTE — Progress Notes (Signed)
Patient's Name: Jennifer Harmon  MRN: [id number]  Referring Provider: Glenda Chroman, MD  DOB: September 02, 1961  PCP: Glenda Chroman, MD  DOS: 12/24/2018  Note by: Dionisio David, NP  Service setting: Ambulatory outpatient  Specialty: Interventional Pain Management  Location: [facility] (AMB) Pain Management Facility    Patient type: Established   HPI  Reason for Visit: Jennifer Harmon is a 58 y.o. year old, female patient, who comes today with a chief complaint of Back Pain Last Appointment: Her last appointment at our practice was on 12/10/2018. I last saw her on 12/10/2018.  Pain Assessment: Today, Jennifer Harmon describes the severity of the Chronic pain as a 8 /10. She indicates the location/referral of the pain to be   Left, Right, Lower/pain radiaties down both leg but worse on the right. Onset was: More than a month ago. The quality of pain is described as Aching, Burning, Constant, Sharp. Temporal description, or timing of pain is: Constant. Possible modifying factors: medications, ice, rest. Jennifer Harmon  height is '5\' 3"'$  (1.6 m) and weight is 201 lb (91.2 kg). Her temperature is 97.7 F (36.5 C). Her blood pressure is 114/58 (abnormal) and her pulse is 91. Her oxygen saturation is 100%.  She was recently seen by Dr. Starlyn Skeans orthopedic surgeon.  She did have x-rays.  She also had an MRI on 12/21/2018. The recommendation of Dr. Starlyn Skeans was to have a right genicular nerve block.  She is concerned with why she needs to have this.  She admits that the pain does seem to go up into her thigh. She is having a flare up muscle spasms.  She does use magnesium and cyclobenzaprine regularly.  She admits that she had recent lab work at her primary care office.  Inflammatory markers were completed by orthopedist office.  They were in normal range.  Controlled Substance Pharmacotherapy Assessment REMS (Risk Evaluation and Mitigation Strategy)  Analgesic:Oxycodone IR 5 mg twice daily MME/day:15 mg/day. Jennifer Fischer, RN   12/24/2018  9:28 AM  Sign when Signing Visit Nursing Pain Medication Assessment:  Safety precautions to be maintained throughout the outpatient stay will include: orient to surroundings, keep bed in low position, maintain call bell within reach at all times, provide assistance with transfer out of bed and ambulation.  Medication Inspection Compliance: Pill count conducted under aseptic conditions, in front of the patient. Neither the pills nor the bottle was removed from the patient's sight at any time. Once count was completed pills were immediately returned to the patient in their original bottle.  Medication: Oxycodone IR Pill/Patch Count: 2 of 60 pills remain Pill/Patch Appearance: Markings consistent with prescribed medication Bottle Appearance: Standard pharmacy container. Clearly labeled. Filled Date: 1 / 3 / 2020 Last Medication intake:  Today   Pharmacokinetics: Liberation and absorption (onset of action): WNL Distribution (time to peak effect): WNL Metabolism and excretion (duration of action): WNL         Pharmacodynamics: Desired effects: Analgesia: Jennifer Harmon reports >50% benefit. Functional ability: Patient reports that medication allows her to accomplish basic ADLs Clinically meaningful improvement in function (CMIF): Sustained CMIF goals met Perceived effectiveness: Described as relatively effective, allowing for increase in activities of daily living (ADL) Undesirable effects: Side-effects or Adverse reactions: None reported Monitoring: [place] PMP: Online review of the past 85-monthperiod conducted. Compliant with practice rules and regulations Last UDS on record: Summary  Date Value Ref Range Status  05/16/2018 FINAL  Final    Comment:    ====================================================================  TOXASSURE SELECT 13 (MW) ==================================================================== Test                             Result       Flag       Units Drug  Present and Declared for Prescription Verification   7-aminoclonazepam              552          EXPECTED   ng/mg creat    7-aminoclonazepam is an expected metabolite of clonazepam. Source    of clonazepam is a scheduled prescription medication.   Hydrocodone                    539          EXPECTED   ng/mg creat   Hydromorphone                  46           EXPECTED   ng/mg creat   Dihydrocodeine                 124          EXPECTED   ng/mg creat   Norhydrocodone                 681          EXPECTED   ng/mg creat    Sources of hydrocodone include scheduled prescription    medications. Hydromorphone, dihydrocodeine and norhydrocodone are    expected metabolites of hydrocodone. Hydromorphone and    dihydrocodeine are also available as scheduled prescription    medications.   Oxycodone                      65           EXPECTED   ng/mg creat   Oxymorphone                    84           EXPECTED   ng/mg creat    Sources of oxycodone include scheduled prescription medications.    Oxymorphone is an expected metabolite of oxycodone. Oxymorphone    is also available as a scheduled prescription medication. ==================================================================== Test                      Result    Flag   Units      Ref Range   Creatinine              122              mg/dL      >=20 ==================================================================== Declared Medications:  The flagging and interpretation on this report are based on the  following declared medications.  Unexpected results may arise from  inaccuracies in the declared medications.  **Note: The testing scope of this panel includes these medications:  Clonazepam  Hydrocodone (Hydrocodone-Acetaminophen)  Oxycodone (Oxy-IR)  **Note: The testing scope of this panel does not include following  reported medications:  Acetaminophen (Hydrocodone-Acetaminophen)  Citalopram  Cyclobenzaprine  Latanoprost  Magnesium  Oxide  Pregabalin  Ranitidine ==================================================================== For clinical consultation, please call [phone]. ====================================================================    UDS interpretation: Compliant          Medication Assessment Form: Reviewed. Patient indicates being compliant with therapy Treatment compliance: Compliant Risk Assessment Profile: Aberrant behavior:  See initial evaluations. None observed or detected today Comorbid factors increasing risk of overdose: See initial evaluation. No additional risks detected today Opioid risk tool (ORT):  Opioid Risk  12/24/2018  Alcohol 0  Illegal Drugs 0  Rx Drugs 0  Alcohol 0  Illegal Drugs 0  Rx Drugs 0  Age between 16-45 years  0  History of Preadolescent Sexual Abuse 0  Psychological Disease 0  ADD -  OCD -  Bipolar -  Depression 0  Opioid Risk Tool Scoring 0  Opioid Risk Interpretation Low Risk    ORT Scoring interpretation table:  Score <3 = Low Risk for SUD  Score between 4-7 = Moderate Risk for SUD  Score >8 = High Risk for Opioid Abuse   Risk of substance use disorder (SUD): Low  Risk Mitigation Strategies:  Patient Counseling: Covered Patient-Prescriber Agreement (PPA): Present and active  Notification to other healthcare providers: Done  Pharmacologic Plan: No change in therapy, at this time.             ROS  Constitutional: Denies any fever or chills Gastrointestinal: No reported hemesis, hematochezia, vomiting, or acute GI distress Musculoskeletal: Denies any acute onset joint swelling, redness, loss of ROM, or weakness Neurological: No reported episodes of acute onset apraxia, aphasia, dysarthria, agnosia, amnesia, paralysis, loss of coordination, or loss of consciousness  Medication Review  Magnesium Oxide, citalopram, clonazePAM, cyclobenzaprine, diclofenac sodium, latanoprost, multivitamin with minerals, oxyCODONE, pregabalin, ranitidine, and  rosuvastatin  History Review  Allergy: Ms. Boyum is allergic to asa [aspirin]; bupropion; and tape. Drug: Ms. Magloire  reports no history of drug use. Alcohol:  reports no history of alcohol use. Tobacco:  reports that she quit smoking about 3 years ago. Her smoking use included pipe. She has a 10.00 pack-year smoking history. She uses smokeless tobacco. Social: Ms. Boyar  reports that she quit smoking about 3 years ago. Her smoking use included pipe. She has a 10.00 pack-year smoking history. She uses smokeless tobacco. She reports that she does not drink alcohol or use drugs. Medical:  has a past medical history of Anxiety, Chronic chest pain, Depression, DJD (degenerative joint disease), Fibromyalgia, GERD (gastroesophageal reflux disease), and Hypercholesterolemia. Surgical: Ms. Elizondo  has a past surgical history that includes Cholecystectomy; Cesarean section; and Back surgery (2008). Family: family history includes Arthritis in her father; Hypertension in her mother. Problem List: Ms. Rao has Chronic upper back pain (Primary Area of Pain) (Bilateral) (R>L); Chronic neck pain (Tertiary Area of Pain) (Bilateral) (R>L); Chronic shoulder pain; Chronic upper extremity pain (Bilateral) (R>L); Chronic pain syndrome; and Chronic sacroiliac joint pain (Bilateral) (R>L) on their pertinent problem list.  Lab Review  Kidney Function Lab Results  Component Value Date   BUN 16 09/06/2017   CREATININE 0.67 09/06/2017   GFRAA >60 09/06/2017   GFRNONAA >60 09/06/2017  Liver Function Lab Results  Component Value Date   AST 26 09/06/2017   ALT 31 09/06/2017   ALBUMIN 4.8 09/06/2017  Note: Above Lab results reviewed.  Imaging Review  DG C-Arm 1-60 Min-No Report Fluoroscopy was utilized by the requesting physician.  No radiographic  interpretation.  Note: Above imaging results reviewed.        Physical Exam  General appearance: Well nourished, well developed, and well hydrated. In no  apparent acute distress Mental status: Alert, oriented x 3 (person, place, & time)       Respiratory: No evidence of acute respiratory distress Eyes: PERLA Vitals: BP (!) 114/58   Pulse  91   Temp 97.7 F (36.5 C)   Ht '5\' 3"'$  (1.6 m)   Wt 201 lb (91.2 kg)   SpO2 100%   BMI 35.61 kg/m  BMI: Estimated body mass index is 35.61 kg/m as calculated from the following:   Height as of this encounter: '5\' 3"'$  (1.6 m).   Weight as of this encounter: 201 lb (91.2 kg). Ideal: Ideal body weight: 52.4 kg (115 lb 8.3 oz) Adjusted ideal body weight: 67.9 kg (149 lb 11.4 oz)  Gait & Posture Assessment  Ambulation: Patient came in today in a wheel chair Gait: Antalgic Posture: WNL       Side: Left lower extremity   Stability: No instability observed           Skin & Extremity Inspection: Evidence of prior arthroplastic surgery   Functional ROM: Unrestricted ROM                   Muscle Tone/Strength: Functionally intact. No obvious neuro-muscular anomalies detected.   Sensory (Neurological): Unimpaired            Palpation: No palpable anomalies   Assessment   Status Diagnosis  Having a Flare-up Having a Flare-up Controlled 1. Osteoarthritis of knee (Right)   2. Chronic musculoskeletal pain   3. Spondylosis without myelopathy or radiculopathy, lumbosacral region   4. Muscle spasms of both lower extremities   5. Neurogenic pain   6. Fibromyalgia   7. Chronic pain syndrome   8. Long term prescription opiate use   9. S/P total knee replacement (Right)      Updated Problems: Problem  Pain Due to Total Right Knee Replacement (Hcc)    Plan of Care  Medications: I have changed Shyrl Numbers. Blumstein's oxyCODONE, oxyCODONE, and oxyCODONE. I am also having her start on diclofenac sodium. Additionally, I am having her maintain her citalopram, clonazePAM, latanoprost, ranitidine, ranitidine, rosuvastatin, multivitamin with minerals, cyclobenzaprine, LYRICA, and Magnesium Oxide.  Administered  today: Shyrl Numbers. Losey had no medications administered during this visit.  Orders:  Orders Placed This Encounter  Procedures  . GENICULAR NERVE BLOCK    Indication(s):  Sub-acute knee pain    Standing Status:   Future    Standing Expiration Date:   01/23/2019    Scheduling Instructions:     Side: Right-sided     Sedation: With Sedation.     Timeframe: As soon as schedule allows    Order Specific Question:   Where will this procedure be performed?    Answer:   [facility]  . ToxASSURE Select 13 (MW), Urine    Volume: 30 ml(s). Minimum 3 ml of urine is needed. Document temperature of fresh sample. Indications: Long term (current) use of opiate analgesic (Z60.109)   Interventional options: Planned follow-up:   Diagnostic right genicular nerve block Plan: Return in about 3 months (around 03/24/2019) for MedMgmt, and, Procedure(w/Sedation), w/ Dr. Dossie Arbour, (ASAA), (R) GNB.   Considering: Diagnostic Trigger point injections Diagnostic bilateral LESI Diagnostic bilateral lumbar facet nerve block #3 Possiblebilateral lumbar facet RFA Possiblebilateral Racz(Epidural Neurolysis) Diagnostic thoracic facet nerve block Possible thoracic facet RFA Diagnostic bilateral CESI Diagnosticbilateral cervical facet nerve block Possiblebilateral cervical facet RFA Diagnostic right shoulder intra-articular injection Diagnostic right suprascapular nerve block Possibleright suprascapular RFA Diagnostic bilateral genicular nerve block Possible bilateral genicular RFA   Palliative PRN treatment(s): None at this time     Note by: Dionisio David, NP Date: 12/24/2018; Time: 11:08 AM

## 2018-12-24 NOTE — Telephone Encounter (Signed)
Pt left voicemail stating that she only has 2 oxycodone pills left and she tried to fill her rx at the pharmacy and they told her she couldn't fill it until the 26th. Pt states this is not right.

## 2018-12-24 NOTE — Telephone Encounter (Signed)
Patient has a script she can fill there at the pharmacy dated to fil on or after 12/03/2018. Patient called and informed.

## 2018-12-24 NOTE — Patient Instructions (Signed)
____________________________________________________________________________________________  Medication Rules  Purpose: To inform patients, and their family members, of our rules and regulations.  Applies to: All patients receiving prescriptions (written or electronic).  Pharmacy of record: Pharmacy where electronic prescriptions will be sent. If written prescriptions are taken to a different pharmacy, please inform the nursing staff. The pharmacy listed in the electronic medical record should be the one where you would like electronic prescriptions to be sent.  Electronic prescriptions: In compliance with the [place] Strengthen Opioid Misuse Prevention (STOP) Act of 2017 (Session Law 2017-74/H243), effective November 07, 2018, all controlled substances must be electronically prescribed. Calling prescriptions to the pharmacy will cease to exist.  Prescription refills: Only during scheduled appointments. Applies to all prescriptions.  NOTE: The following applies primarily to controlled substances (Opioid* Pain Medications).   Patient's responsibilities: 1. Pain Pills: Bring all pain pills to every appointment (except for procedure appointments). 2. Pill Bottles: Bring pills in original pharmacy bottle. Always bring the newest bottle. Bring bottle, even if empty. 3. Medication refills: You are responsible for knowing and keeping track of what medications you take and those you need refilled. The day before your appointment: write a list of all prescriptions that need to be refilled. The day of the appointment: give the list to the admitting nurse. Prescriptions will be written only during appointments. No prescriptions will be written on procedure days. If you forget a medication: it will not be "Called in", "Faxed", or "electronically sent". You will need to get another appointment to get these prescribed. No early refills. Do not call asking to have your prescription filled  early. 4. Prescription Accuracy: You are responsible for carefully inspecting your prescriptions before leaving our office. Have the discharge nurse carefully go over each prescription with you, before taking them home. Make sure that your name is accurately spelled, that your address is correct. Check the name and dose of your medication to make sure it is accurate. Check the number of pills, and the written instructions to make sure they are clear and accurate. Make sure that you are given enough medication to last until your next medication refill appointment. 5. Taking Medication: Take medication as prescribed. When it comes to controlled substances, taking less pills or less frequently than prescribed is permitted and encouraged. Never take more pills than instructed. Never take medication more frequently than prescribed.  6. Inform other Doctors: Always inform, all of your healthcare providers, of all the medications you take. 7. Pain Medication from other Providers: You are not allowed to accept any additional pain medication from any other Doctor or Healthcare provider. There are two exceptions to this rule. (see below) In the event that you require additional pain medication, you are responsible for notifying us, as stated below. 8. Medication Agreement: You are responsible for carefully reading and following our Medication Agreement. This must be signed before receiving any prescriptions from our practice. Safely store a copy of your signed Agreement. Violations to the Agreement will result in no further prescriptions. (Additional copies of our Medication Agreement are available upon request.) 9. Laws, Rules, & Regulations: All patients are expected to follow all Federal and State Laws, Statutes, Rules, & Regulations. Ignorance of the Laws does not constitute a valid excuse. The use of any illegal substances is prohibited. 10. Adopted CDC guidelines & recommendations: Target dosing levels will be  at or below 60 MME/day. Use of benzodiazepines** is not recommended.  Exceptions: There are only two exceptions to the rule of not   receiving pain medications from other Healthcare Providers. 1. Exception #1 (Emergencies): In the event of an emergency (i.e.: accident requiring emergency care), you are allowed to receive additional pain medication. However, you are responsible for: As soon as you are able, call our office [phone], at any time of the day or night, and leave a message stating your name, the date and nature of the emergency, and the name and dose of the medication prescribed. In the event that your call is answered by a member of our staff, make sure to document and save the date, time, and the name of the person that took your information.  2. Exception #2 (Planned Surgery): In the event that you are scheduled by another doctor or dentist to have any type of surgery or procedure, you are allowed (for a period no longer than 30 days), to receive additional pain medication, for the acute post-op pain. However, in this case, you are responsible for picking up a copy of our "Post-op Pain Management for Surgeons" handout, and giving it to your surgeon or dentist. This document is available at our office, and does not require an appointment to obtain it. Simply go to our office during business hours (Monday-Thursday from 8:00 AM to 4:00 PM) (Friday 8:00 AM to 12:00 Noon) or if you have a scheduled appointment with us, prior to your surgery, and ask for it by name. In addition, you will need to provide us with your name, name of your surgeon, type of surgery, and date of procedure or surgery.  *Opioid medications include: morphine, codeine, oxycodone, oxymorphone, hydrocodone, hydromorphone, meperidine, tramadol, tapentadol, buprenorphine, fentanyl, methadone. **Benzodiazepine medications include: diazepam (Valium), alprazolam (Xanax), clonazepam (Klonopine), lorazepam (Ativan), clorazepate  (Tranxene), chlordiazepoxide (Librium), estazolam (Prosom), oxazepam (Serax), temazepam (Restoril), triazolam (Halcion) (Last updated: 01/04/2018) ____________________________________________________________________________________________    

## 2018-12-24 NOTE — Progress Notes (Signed)
Nursing Pain Medication Assessment:  Safety precautions to be maintained throughout the outpatient stay will include: orient to surroundings, keep bed in low position, maintain call bell within reach at all times, provide assistance with transfer out of bed and ambulation.  Medication Inspection Compliance: Pill count conducted under aseptic conditions, in front of the patient. Neither the pills nor the bottle was removed from the patient's sight at any time. Once count was completed pills were immediately returned to the patient in their original bottle.  Medication: Oxycodone IR Pill/Patch Count: 2 of 60 pills remain Pill/Patch Appearance: Markings consistent with prescribed medication Bottle Appearance: Standard pharmacy container. Clearly labeled. Filled Date: 1 / 3 / 2020 Last Medication intake:  Today

## 2018-12-28 LAB — TOXASSURE SELECT 13 (MW), URINE

## 2019-01-03 ENCOUNTER — Ambulatory Visit: Payer: PPO | Attending: Nurse Practitioner | Admitting: Pain Medicine

## 2019-01-03 ENCOUNTER — Encounter: Payer: Self-pay | Admitting: Pain Medicine

## 2019-01-03 ENCOUNTER — Other Ambulatory Visit: Payer: Self-pay

## 2019-01-03 VITALS — BP 128/70 | HR 91 | Temp 97.6°F | Resp 20 | Ht 63.0 in | Wt 192.0 lb

## 2019-01-03 DIAGNOSIS — M25562 Pain in left knee: Secondary | ICD-10-CM

## 2019-01-03 DIAGNOSIS — G8929 Other chronic pain: Secondary | ICD-10-CM | POA: Insufficient documentation

## 2019-01-03 DIAGNOSIS — M1711 Unilateral primary osteoarthritis, right knee: Secondary | ICD-10-CM

## 2019-01-03 DIAGNOSIS — M25561 Pain in right knee: Secondary | ICD-10-CM

## 2019-01-03 DIAGNOSIS — Z96651 Presence of right artificial knee joint: Secondary | ICD-10-CM

## 2019-01-03 NOTE — Progress Notes (Addendum)
Safety precautions to be maintained throughout the outpatient stay will include: orient to surroundings, keep bed in low position, maintain call bell within reach at all times, provide assistance with transfer out of bed and ambulation. Patient ate granola bar this morning and decided to reschedule appointment.

## 2019-01-03 NOTE — Progress Notes (Signed)
Case rescheduled because the patient had not kept her n.p.o. orders.  She was given the choice to have the procedure done without sedation but she declined.

## 2019-01-10 ENCOUNTER — Ambulatory Visit (HOSPITAL_BASED_OUTPATIENT_CLINIC_OR_DEPARTMENT_OTHER): Payer: PPO | Admitting: Pain Medicine

## 2019-01-10 ENCOUNTER — Ambulatory Visit
Admission: RE | Admit: 2019-01-10 | Discharge: 2019-01-10 | Disposition: A | Discharge status: Home / Self Care | Payer: PPO | Source: Ambulatory Visit | Attending: Pain Medicine | Admitting: Pain Medicine

## 2019-01-10 ENCOUNTER — Other Ambulatory Visit: Payer: Self-pay

## 2019-01-10 ENCOUNTER — Encounter: Payer: Self-pay | Admitting: Pain Medicine

## 2019-01-10 VITALS — BP 133/70 | HR 86 | Temp 98.4°F | Resp 16 | Ht 63.0 in | Wt 192.0 lb

## 2019-01-10 DIAGNOSIS — M25562 Pain in left knee: Secondary | ICD-10-CM

## 2019-01-10 DIAGNOSIS — M1711 Unilateral primary osteoarthritis, right knee: Secondary | ICD-10-CM | POA: Diagnosis not present

## 2019-01-10 DIAGNOSIS — M25561 Pain in right knee: Secondary | ICD-10-CM

## 2019-01-10 DIAGNOSIS — Z96651 Presence of right artificial knee joint: Secondary | ICD-10-CM | POA: Diagnosis not present

## 2019-01-10 DIAGNOSIS — G8929 Other chronic pain: Secondary | ICD-10-CM | POA: Insufficient documentation

## 2019-01-10 MED ORDER — LACTATED RINGERS IV SOLN
1000.0000 mL | Freq: Once | INTRAVENOUS | Status: AC
  Administered 2019-01-10: 1000 mL via INTRAVENOUS

## 2019-01-10 MED ORDER — MIDAZOLAM HCL 5 MG/5ML IJ SOLN
1.0000 mg | INTRAMUSCULAR | Status: DC | PRN
  Administered 2019-01-10: 2 mg via INTRAVENOUS
  Filled 2019-01-10: qty 5

## 2019-01-10 MED ORDER — FENTANYL CITRATE (PF) 100 MCG/2ML IJ SOLN
25.0000 ug | INTRAMUSCULAR | Status: DC | PRN
  Administered 2019-01-10: 50 ug via INTRAVENOUS
  Filled 2019-01-10: qty 2

## 2019-01-10 MED ORDER — ROPIVACAINE HCL 2 MG/ML IJ SOLN
9.0000 mL | Freq: Once | INTRAMUSCULAR | Status: AC
  Administered 2019-01-10: 10 mL
  Filled 2019-01-10: qty 10

## 2019-01-10 MED ORDER — METHYLPREDNISOLONE ACETATE 80 MG/ML IJ SUSP
80.0000 mg | Freq: Once | INTRAMUSCULAR | Status: AC
  Administered 2019-01-10: 80 mg
  Filled 2019-01-10: qty 1

## 2019-01-10 MED ORDER — LIDOCAINE HCL 2 % IJ SOLN
20.0000 mL | Freq: Once | INTRAMUSCULAR | Status: AC
  Administered 2019-01-10: 400 mg
  Filled 2019-01-10: qty 20

## 2019-01-10 NOTE — Progress Notes (Signed)
Patient's Name: Jennifer Harmon  MRN: [id number]  Referring Provider: Ignatius Specking, MD  DOB: Apr 01, 1961  PCP: Ignatius Specking, MD  DOS: 01/10/2019  Note by: Oswaldo Done, MD  Service setting: Ambulatory outpatient  Specialty: Interventional Pain Management  Patient type: Established  Location: [facility] (AMB) Pain Management Facility  Visit type: Interventional Procedure   Primary Reason for Visit: Interventional Pain Management Treatment. CC: Knee Pain (right)  Procedure:          Anesthesia, Analgesia, Anxiolysis:  Type: Genicular Nerves Block (Superior-lateral, Superior-medial, and Inferior-medial Genicular Nerves) #1  CPT: 64450      Primary Purpose: Diagnostic Region: Lateral, Anterior, and Medial aspects of the knee joint, above and below the knee joint proper. Level: Superior and inferior to the knee joint. Target Area: For Genicular Nerve block(s), the targets are: the superior-lateral genicular nerve, located in the lateral distal portion of the femoral shaft as it curves to form the lateral epicondyle, in the region of the distal femoral metaphysis; the superior-medial genicular nerve, located in the medial distal portion of the femoral shaft as it curves to form the medial epicondyle; and the inferior-medial genicular nerve, located in the medial, proximal portion of the tibial shaft, as it curves to form the medial epicondyle, in the region of the proximal tibial metaphysis. Approach: Anterior, percutaneous, ipsilateral approach. Laterality: Right knee  Type: Moderate (Conscious) Sedation combined with Local Anesthesia Indication(s): Analgesia and Anxiety Route: Intravenous (IV) IV Access: Secured Sedation: Meaningful verbal contact was maintained at all times during the procedure  Local Anesthetic: Lidocaine 1-2%  Position: Modified Fowler's position with pillows under the targeted knee(s).   Indications: 1. Chronic knee pain after total replacement (TKR) (Right)   2.  Osteoarthritis of knee (Right)   3. S/P total knee replacement (Right)   4. Chronic knee pain (Fourth Area of Pain) (Bilateral) (R>L)    Pain Score: Pre-procedure: 8 /10 Post-procedure: 0-No pain/10  Pre-op Assessment:  Jennifer Harmon is a 58 y.o. (year old), female patient, seen today for interventional treatment. She  has a past surgical history that includes Cholecystectomy; Cesarean section; and Back surgery (2008). Jennifer Harmon has a current medication list which includes the following prescription(s): citalopram, clonazepam, cyclobenzaprine, diclofenac sodium, latanoprost, lyrica, magnesium oxide, multivitamin with minerals, oxycodone, oxycodone, oxycodone, ranitidine, rosuvastatin, and ranitidine, and the following Facility-Administered Medications: fentanyl, lactated ringers, and midazolam. Her primarily concern today is the Knee Pain (right)  Initial Vital Signs:  Pulse/HCG Rate: 86ECG Heart Rate: 92 Temp: 98.4 F (36.9 C) Resp: 18 BP: 118/68 SpO2: 96 %  BMI: Estimated body mass index is 34.01 kg/m as calculated from the following:   Height as of this encounter: 5\' 3"  (1.6 m).   Weight as of this encounter: 192 lb (87.1 kg).  Risk Assessment: Allergies: Reviewed. She is allergic to asa [aspirin]; bupropion; and tape.  Allergy Precautions: None required Coagulopathies: Reviewed. None identified.  Blood-thinner therapy: None at this time Active Infection(s): Reviewed. None identified. Jennifer Harmon is afebrile  Site Confirmation: Jennifer Harmon was asked to confirm the procedure and laterality before marking the site Procedure checklist: Completed Consent: Before the procedure and under the influence of no sedative(s), amnesic(s), or anxiolytics, the patient was informed of the treatment options, risks and possible complications. To fulfill our ethical and legal obligations, as recommended by the American Medical Association's Code of Ethics, I have informed the patient of my clinical  impression; the nature and purpose of the treatment or procedure; the  risks, benefits, and possible complications of the intervention; the alternatives, including doing nothing; the risk(s) and benefit(s) of the alternative treatment(s) or procedure(s); and the risk(s) and benefit(s) of doing nothing. The patient was provided information about the general risks and possible complications associated with the procedure. These may include, but are not limited to: failure to achieve desired goals, infection, bleeding, organ or nerve damage, allergic reactions, paralysis, and death. In addition, the patient was informed of those risks and complications associated to the procedure, such as failure to decrease pain; infection; bleeding; organ or nerve damage with subsequent damage to sensory, motor, and/or autonomic systems, resulting in permanent pain, numbness, and/or weakness of one or several areas of the body; allergic reactions; (i.e.: anaphylactic reaction); and/or death. Furthermore, the patient was informed of those risks and complications associated with the medications. These include, but are not limited to: allergic reactions (i.e.: anaphylactic or anaphylactoid reaction(s)); adrenal axis suppression; blood sugar elevation that in diabetics may result in ketoacidosis or comma; water retention that in patients with history of congestive heart failure may result in shortness of breath, pulmonary edema, and decompensation with resultant heart failure; weight gain; swelling or edema; medication-induced neural toxicity; particulate matter embolism and blood vessel occlusion with resultant organ, and/or nervous system infarction; and/or aseptic necrosis of one or more joints. Finally, the patient was informed that Medicine is not an exact science; therefore, there is also the possibility of unforeseen or unpredictable risks and/or possible complications that may result in a catastrophic outcome. The patient  indicated having understood very clearly. We have given the patient no guarantees and we have made no promises. Enough time was given to the patient to ask questions, all of which were answered to the patient's satisfaction. [name] has indicated that she wanted to continue with the procedure. Attestation: I, the ordering provider, attest that I have discussed with the patient the benefits, risks, side-effects, alternatives, likelihood of achieving goals, and potential problems during recovery for the procedure that I have provided informed consent. Date  Time: 01/10/2019  9:29 AM  Pre-Procedure Preparation:  Monitoring: As per clinic protocol. Respiration, ETCO2, SpO2, BP, heart rate and rhythm monitor placed and checked for adequate function Safety Precautions: Patient was assessed for positional comfort and pressure points before starting the procedure. Time-out: I initiated and conducted the "Time-out" before starting the procedure, as per protocol. The patient was asked to participate by confirming the accuracy of the "Time Out" information. Verification of the correct person, site, and procedure were performed and confirmed by me, the nursing staff, and the patient. "Time-out" conducted as per Joint Commission's Universal Protocol (UP.01.01.01). Time: 1100  Description of Procedure:          Area Prepped: Entire knee area, from mid-thigh to mid-shin, lateral, anterior, and medial aspects. Prepping solution: ChloraPrep (2% chlorhexidine gluconate and 70% isopropyl alcohol) Safety Precautions: Aspiration looking for blood return was conducted prior to all injections. At no point did we inject any substances, as a needle was being advanced. No attempts were made at seeking any paresthesias. Safe injection practices and needle disposal techniques used. Medications properly checked for expiration dates. SDV (single dose vial) medications used. Description of the Procedure: Protocol guidelines were  followed. The patient was placed in position over the procedure table. The target area was identified and the area prepped in the usual manner. Skin & deeper tissues infiltrated with local anesthetic. Appropriate amount of time allowed to pass for local anesthetics to take effect. The  procedure needles were then advanced to the target area. Proper needle placement secured. Negative aspiration confirmed. Solution injected in intermittent fashion, asking for systemic symptoms every 0.5cc of injectate. The needles were then removed and the area cleansed, making sure to leave some of the prepping solution back to take advantage of its long term bactericidal properties.  Vitals:   01/10/19 1107 01/10/19 1117 01/10/19 1127 01/10/19 1137  BP: (!) 143/91 (!) 153/65 138/64 133/70  Pulse:      Resp: 15 16 18 16   Temp:      SpO2: 98% 99% 99% 100%  Weight:      Height:        Start Time: 1100 hrs. End Time: 1106 hrs. Materials:  Needle(s) Type: Spinal Needle Gauge: 22G Length: 3.5-in Medication(s): Please see orders for medications and dosing details.  Imaging Guidance (Non-Spinal):          Type of Imaging Technique: Fluoroscopy Guidance (Non-Spinal) Indication(s): Assistance in needle guidance and placement for procedures requiring needle placement in or near specific anatomical locations not easily accessible without such assistance. Exposure Time: Please see nurses notes. Contrast: None used. Fluoroscopic Guidance: I was personally present during the use of fluoroscopy. "Tunnel Vision Technique" used to obtain the best possible view of the target area. Parallax error corrected before commencing the procedure. "Direction-depth-direction" technique used to introduce the needle under continuous pulsed fluoroscopy. Once target was reached, antero-posterior, oblique, and lateral fluoroscopic projection used confirm needle placement in all planes. Images permanently stored in EMR. Interpretation: No  contrast injected. I personally interpreted the imaging intraoperatively. Adequate needle placement confirmed in multiple planes. Permanent images saved into the patient's record.  Antibiotic Prophylaxis:   Anti-infectives (From admission, onward)   None     Indication(s): None identified  Post-operative Assessment:  Post-procedure Vital Signs:  Pulse/HCG Rate: 8689 Temp: 98.4 F (36.9 C) Resp: 16 BP: 133/70 SpO2: 100 %  EBL: None  Complications: No immediate post-treatment complications observed by team, or reported by patient.  Note: The patient tolerated the entire procedure well. A repeat set of vitals were taken after the procedure and the patient was kept under observation following institutional policy, for this type of procedure. Post-procedural neurological assessment was performed, showing return to baseline, prior to discharge. The patient was provided with post-procedure discharge instructions, including a section on how to identify potential problems. Should any problems arise concerning this procedure, the patient was given instructions to immediately contact us, at any time, without hesitation. In any case, we plan to contact the patient by telephone for a follow-up status report regarding this interventional procedure.  Comments:  No additional relevant information.  Plan of Care    Imaging Orders     DG C-Arm 1-60 Min-No Report Orders:  Orders Placed This Encounter  Procedures  . GENICULAR NERVE BLOCK    Scheduling Instructions:     Side(s): Right Knee     Level(s): Superior-Lateral, Superior-Medial, and Inferior-Medial Genicular Nerves     Sedation: With Sedation.     Timeframe: Today    Order Specific Question:   Where will this procedure be performed?    Answer:   [facility]  . DG C-Arm 1-60 Min-No Report    Intraoperative [facility].    Standing Status:   Standing    Number of  Occurrences:   1    Order Specific Question:   Reason for exam:    Answer:   Assistance  in needle guidance and placement for procedures requiring needle placement in or near specific anatomical locations not easily accessible without such assistance.  . Provider attestation of informed consent for procedure/surgical case    I, the ordering provider, attest that I have discussed with the patient the benefits, risks, side effects, alternatives, likelihood of achieving goals and potential problems during recovery for the procedure that I have provided informed consent.    Standing Status:   Standing    Number of Occurrences:   1  . Informed Consent Details: Transcribe to consent form and obtain patient signature    Consent Attestation: I, the ordering provider, attest that I have discussed with the patient the benefits, risks, side-effects, alternatives, likelihood of achieving goals, and potential problems during recovery for the procedure that I have provided informed consent.    Standing Status:   Standing    Number of Occurrences:   1    Order Specific Question:   Procedure    Answer:   Right-sided genicular nerves block under fluoroscopic guidance    Order Specific Question:   Surgeon    Answer:   Noam Karaffa A. Laban Emperor, MD    Order Specific Question:   Indication/Reason    Answer:   Chronic right-sided knee pain secondary to knee arthropathy/arthralgia   Medications ordered for procedure: Meds ordered this encounter  Medications  . lidocaine (XYLOCAINE) 2 % (with pres) injection 400 mg  . midazolam (VERSED) 5 MG/5ML injection 1-2 mg    Make sure Flumazenil is available in the pyxis when using this medication. If oversedation occurs, administer 0.2 mg IV over 15 sec. If after 45 sec no response, administer 0.2 mg again over 1 min; may repeat at 1 min intervals; not to exceed 4 doses (1 mg)  . fentaNYL (SUBLIMAZE) injection 25-50 mcg    Make sure Narcan is available in the pyxis when using  this medication. In the event of respiratory depression (RR< 8/min): Titrate NARCAN (naloxone) in increments of 0.1 to 0.2 mg IV at 2-3 minute intervals, until desired degree of reversal.  . lactated ringers infusion 1,000 mL  . ropivacaine (PF) 2 mg/mL (0.2%) (NAROPIN) injection 9 mL  . methylPREDNISolone acetate (DEPO-MEDROL) injection 80 mg   Medications administered: We administered lidocaine, midazolam, fentaNYL, lactated ringers, ropivacaine (PF) 2 mg/mL (0.2%), and methylPREDNISolone acetate.  See the medical record for exact dosing, route, and time of administration.  Disposition: Discharge home  Discharge Date & Time: 01/10/2019; 1140 hrs.   Follow-up plan:   Return for PPE (2 wks) w/ Dr. Laban Emperor.   Future Appointments  Date Time Provider Department Center  02/06/2019 11:15 AM Delano Metz, MD [facility] None  03/25/2019 10:30 AM Barbette Merino, NP [facility] None   Primary Care Physician: Ignatius Specking, MD Location: [facility] Note by: Oswaldo Done, MD Date: 01/10/2019; Time: 11:42 AM  Disclaimer:  Medicine is not an Visual merchandiser. The only guarantee in medicine is that nothing is guaranteed. It is important to note that the decision to proceed with this intervention was based on the information collected from the patient. The Data and conclusions were drawn from the patient's questionnaire, the interview, and the physical examination. Because the information was provided in large part by the patient, it cannot be guaranteed that it has not been purposely or unconsciously manipulated. Every effort has been made to obtain as much relevant data as possible for this evaluation. It is important to note that the conclusions  that lead to this procedure are derived in large part from the available data. Always take into account that the treatment will also be dependent on availability of resources and existing treatment guidelines, considered by other  Pain Management Practitioners as being common knowledge and practice, at the time of the intervention. For Medico-Legal purposes, it is also important to point out that variation in procedural techniques and pharmacological choices are the acceptable norm. The indications, contraindications, technique, and results of the above procedure should only be interpreted and judged by a Board-Certified Interventional Pain Specialist with extensive familiarity and expertise in the same exact procedure and technique.

## 2019-01-10 NOTE — Progress Notes (Signed)
Safety precautions to be maintained throughout the outpatient stay will include: orient to surroundings, keep bed in low position, maintain call bell within reach at all times, provide assistance with transfer out of bed and ambulation.  

## 2019-01-10 NOTE — Patient Instructions (Signed)

## 2019-01-11 ENCOUNTER — Telehealth: Payer: Self-pay | Admitting: *Deleted

## 2019-01-11 NOTE — Telephone Encounter (Signed)
No problems post procedure. 

## 2019-01-14 IMAGING — CR DG KNEE 1-2V*R*
1 series · 2 of 2 positions shown · non-contrast
Comparison: None in PACs

CLINICAL DATA: History of right knee joint replacement. Chronic
right knee pain.

EXAM:
RIGHT KNEE - 1-2 VIEW

[Series 1: dg knee 1-2 views right · 0.14mm/px · 2 of 2 slices shown]
[im 1/2]
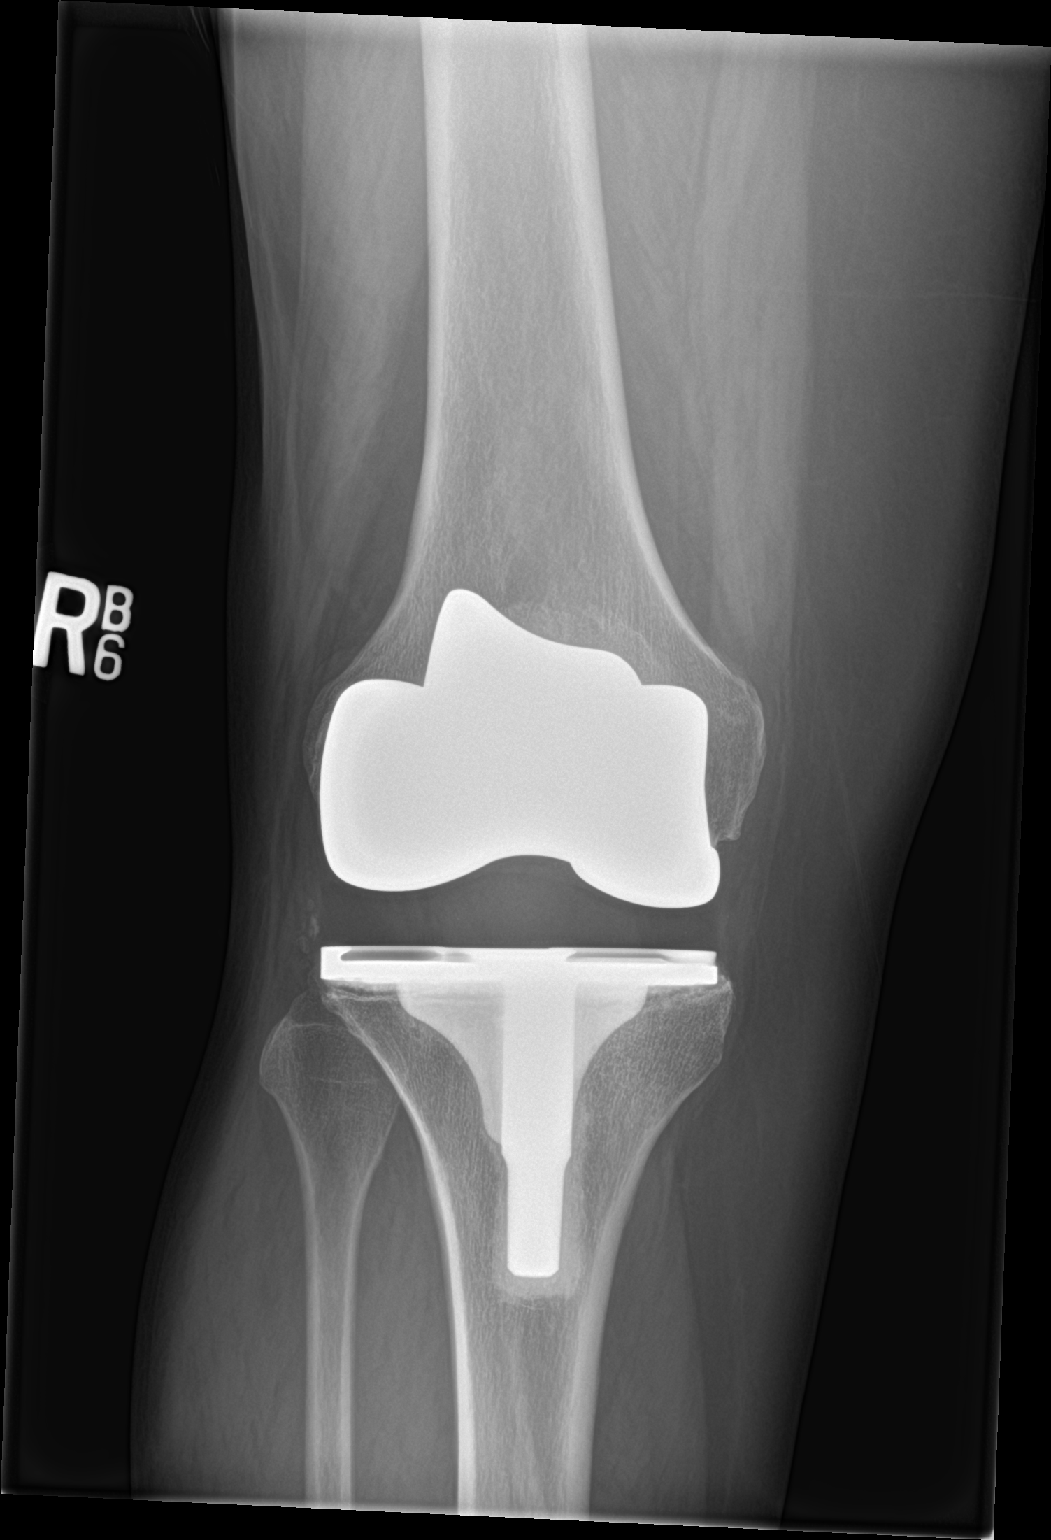
[im 2/2]
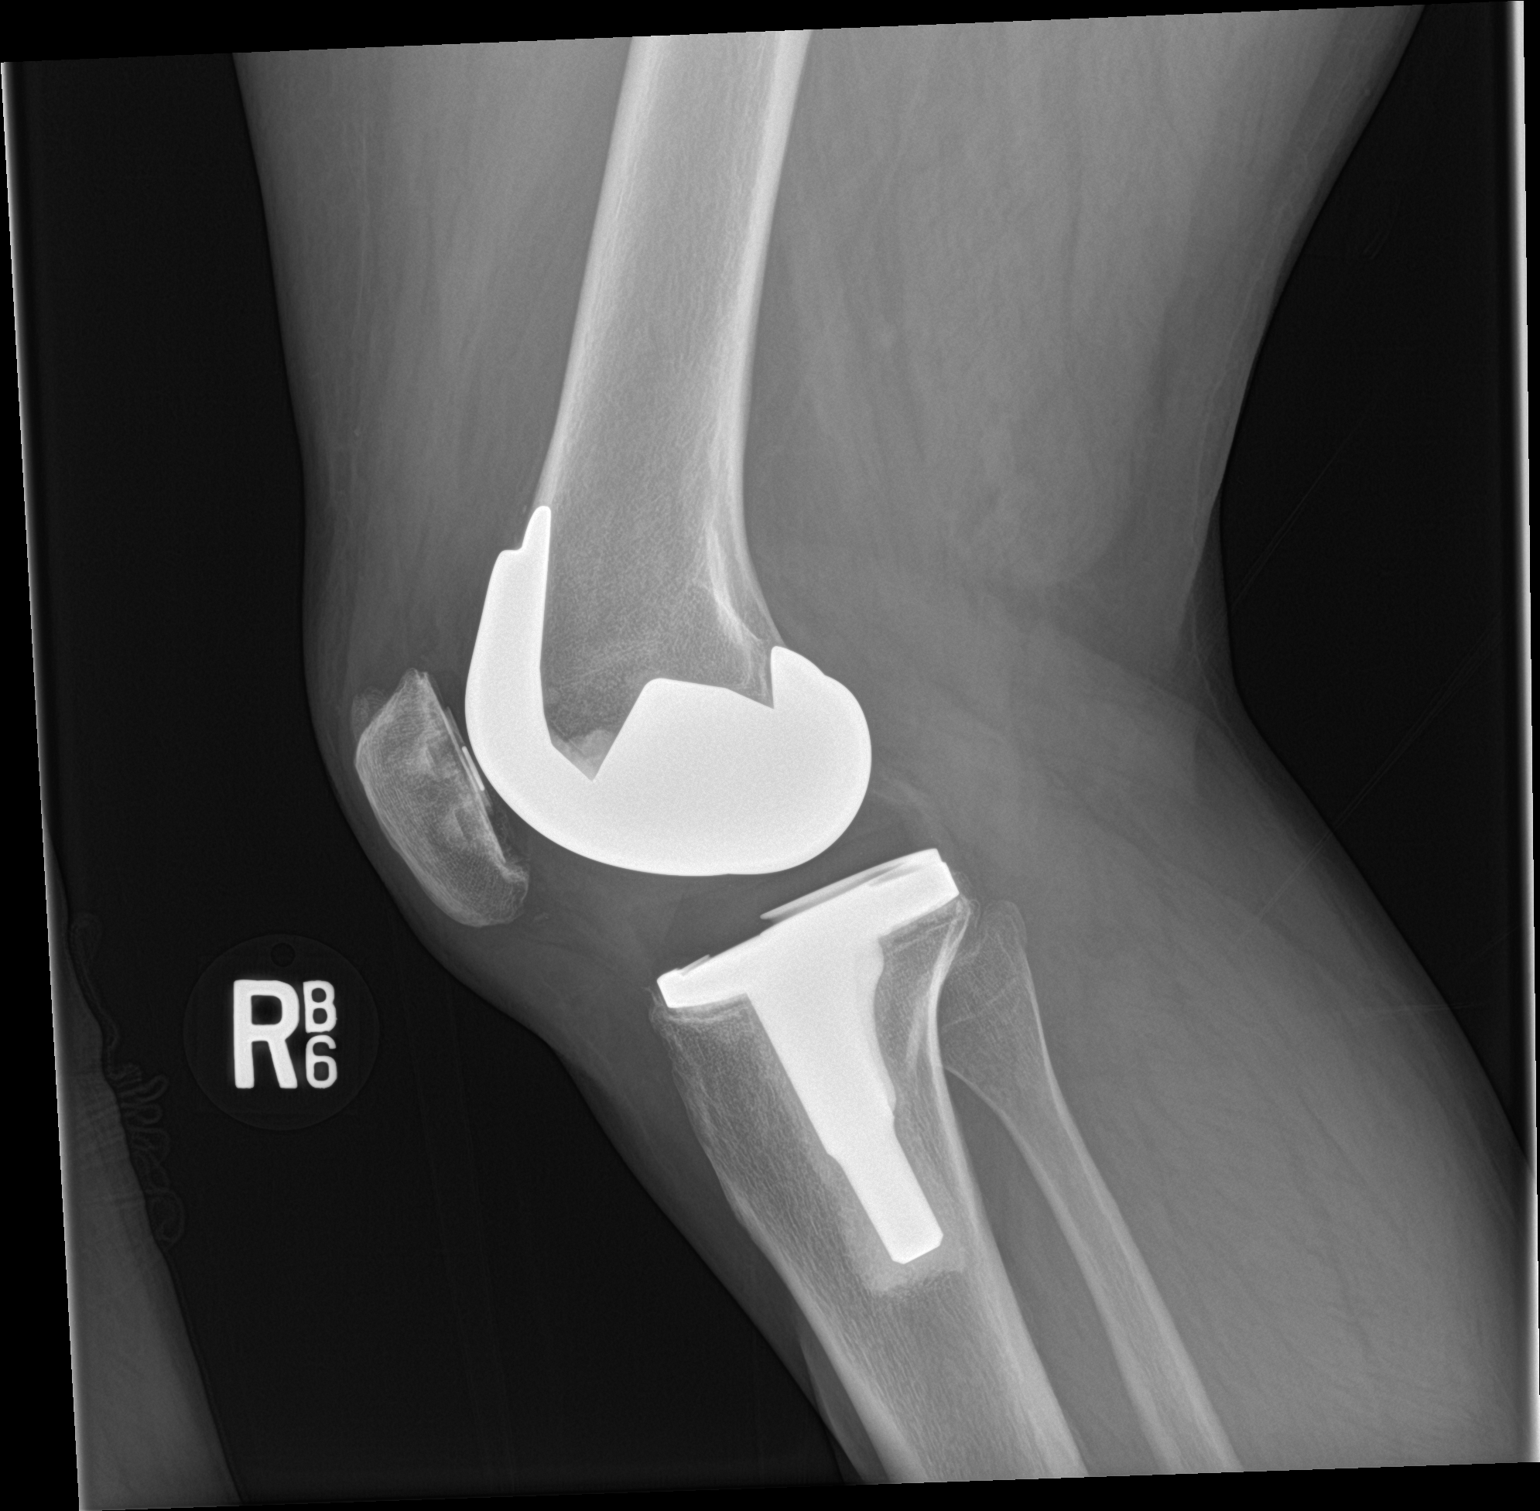

[2 of 2 positions shown; findings below may reference images not displayed]

FINDINGS: The total knee joint prosthesis appears appropriately positioned.
The interface with the native bone appears normal. No acute native
bone abnormality is observed. The soft tissues are unremarkable.
IMPRESSION: There is no acute or significant chronic abnormality of the
prosthetic right knee nor of the native bone.

## 2019-01-14 IMAGING — CR DG SI JOINTS 3+V
1 series · 3 of 3 positions shown · non-contrast
Comparison: None.

CLINICAL DATA: Pain

EXAM:
BILATERAL SACROILIAC JOINTS - 3+ VIEW

[Series 1: dg si joints · 0.14mm/px · 3 of 3 slices shown]
[im 1/3]
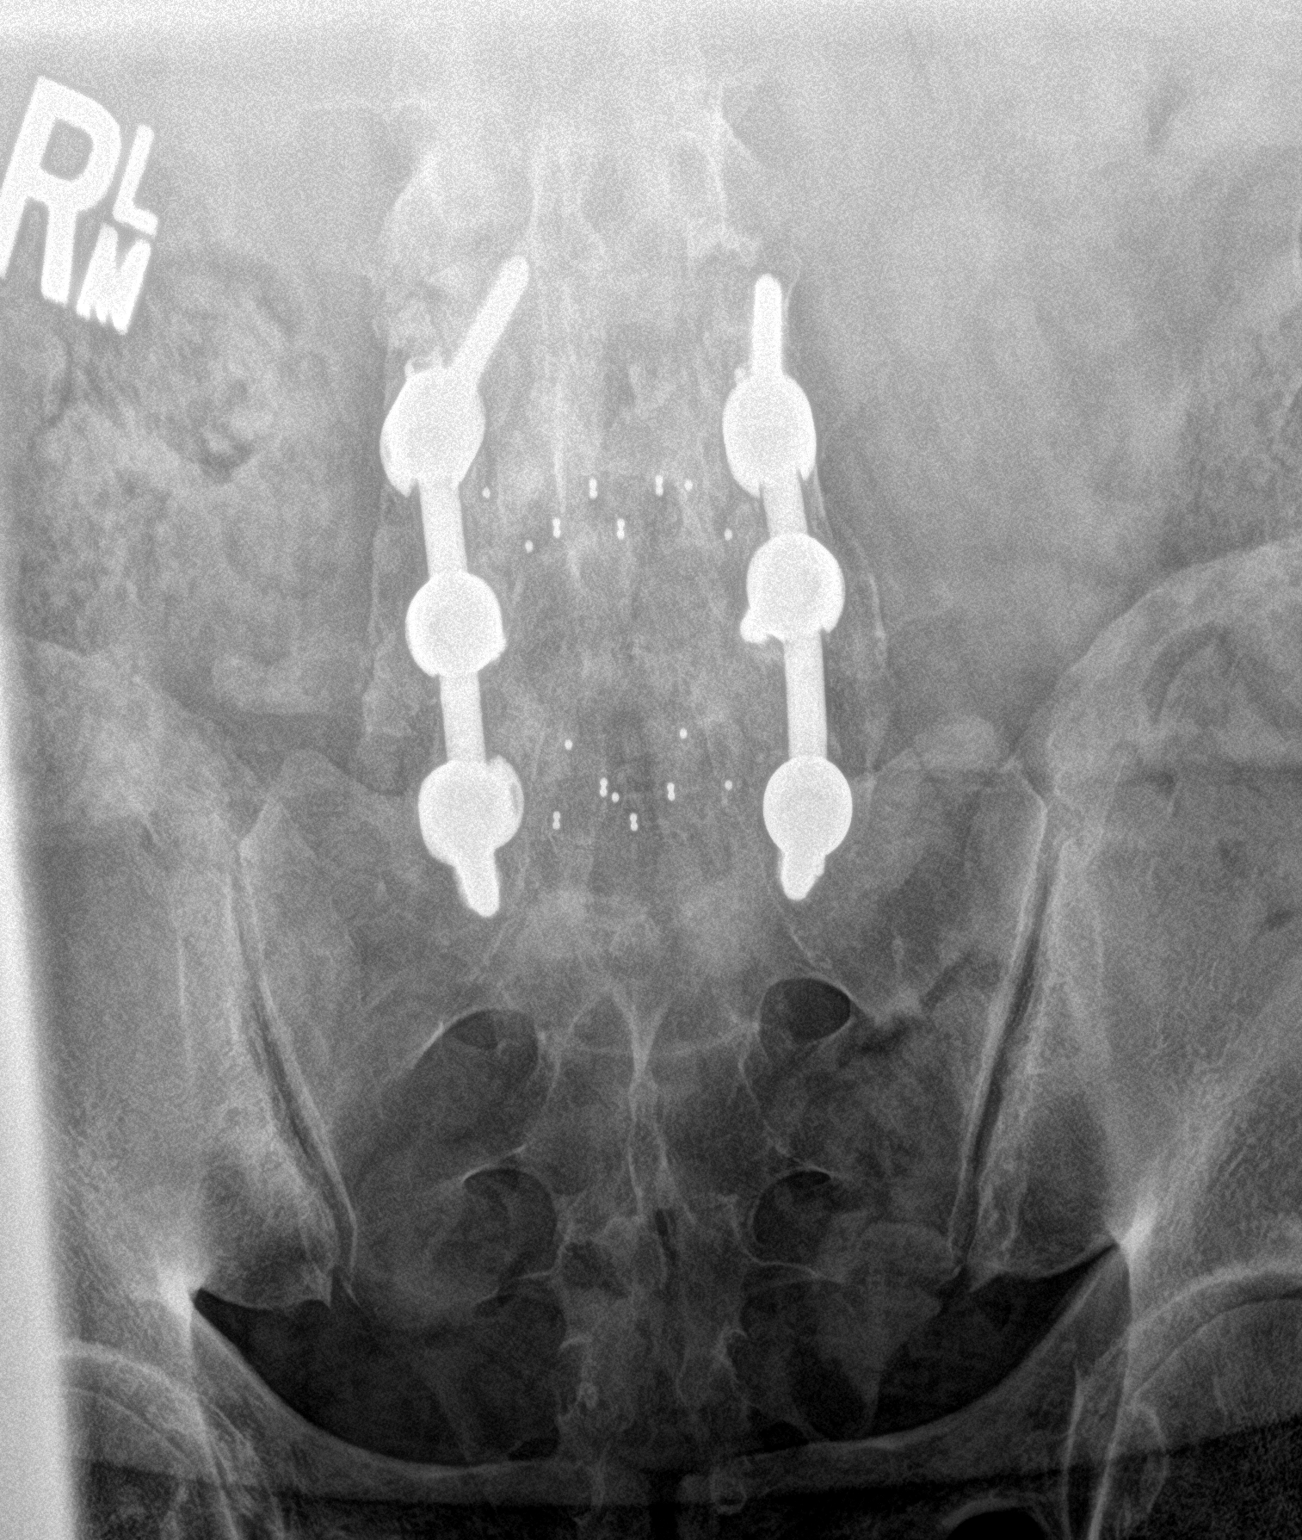
[im 2/3]
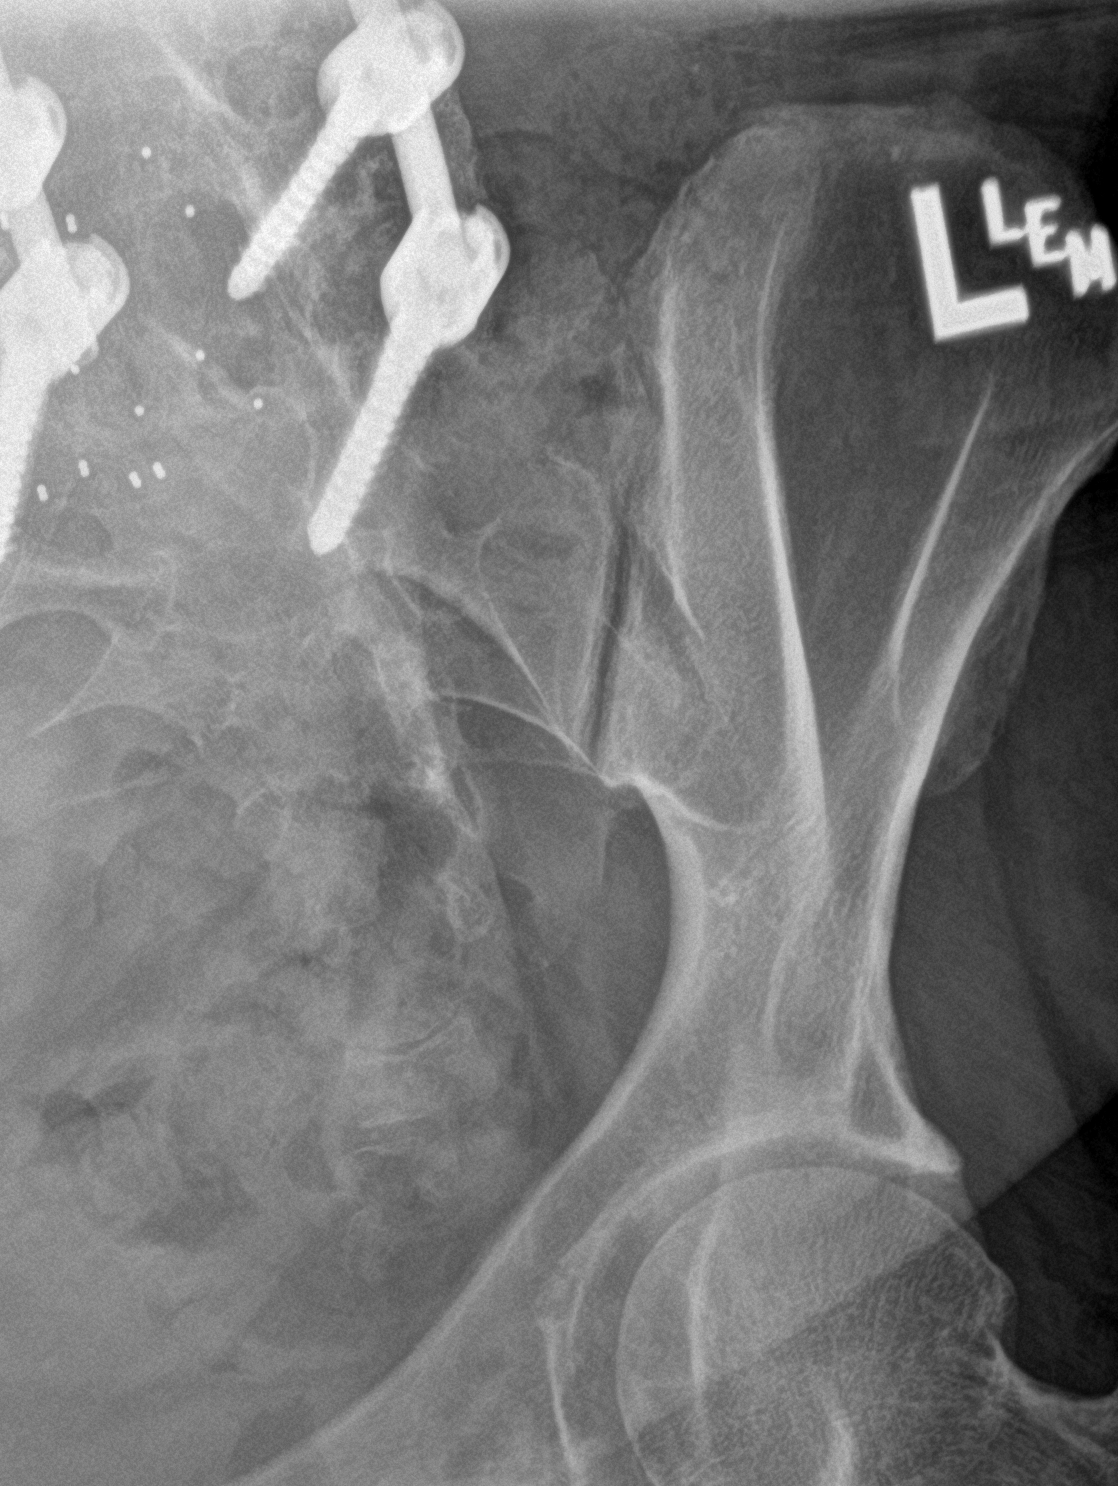
[im 3/3]
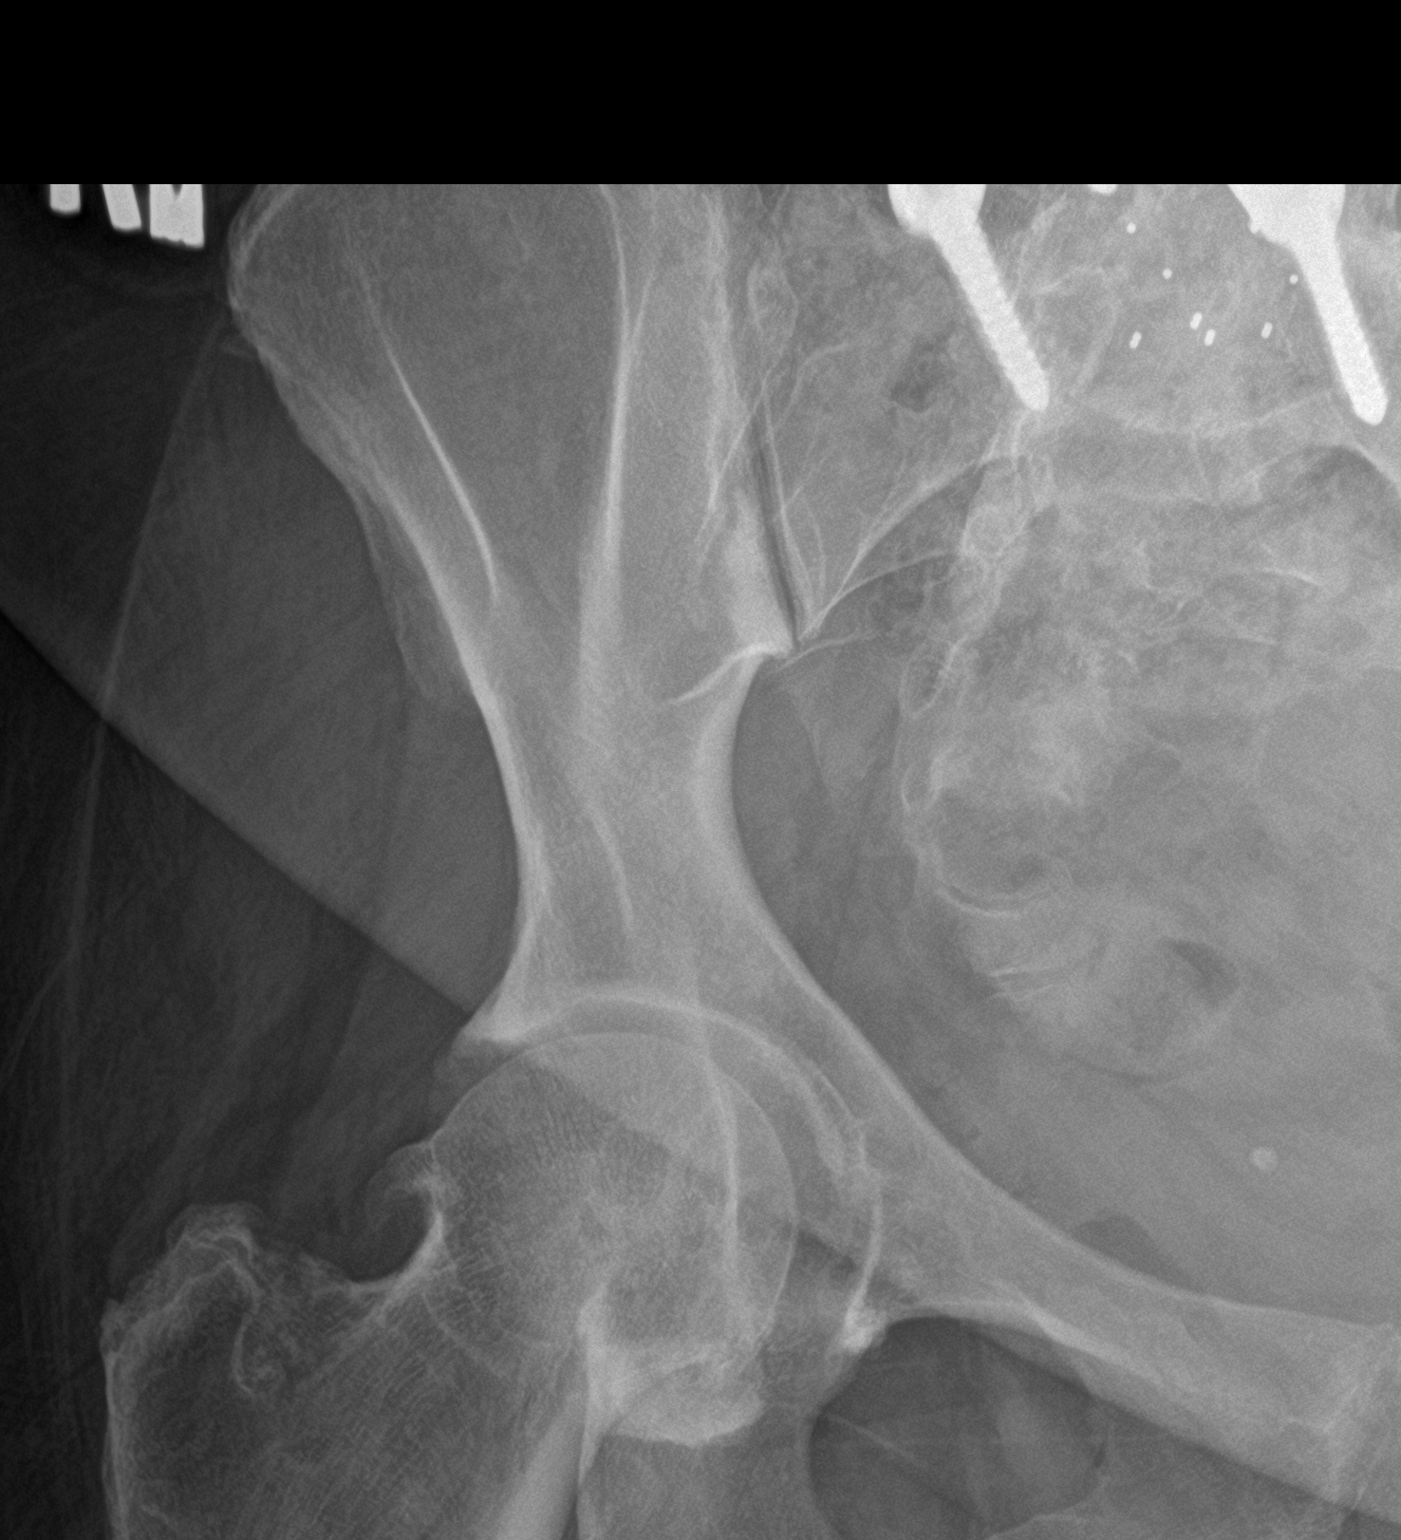

[3 of 3 positions shown; findings below may reference images not displayed]

FINDINGS: Frontal and bilateral oblique views were obtained. There is
postoperative screw and plate fixation from L4-S1 bilaterally with
disc spacers at L4-5 and L5-S1. There has been fusion in the lower
lumbar spine.

There is no fracture or diastases. Sacroiliac joints appear
symmetric bilaterally without arthropathy or sacroiliitis. Hip
joints appear unremarkable bilaterally.
IMPRESSION: No fracture or diastases. No sacroiliac joint arthropathy or
sacroiliitis. Hip joints symmetric. Postoperative change lower
lumbar spine and upper sacrum.

## 2019-01-14 IMAGING — CR DG KNEE 1-2V*L*
1 series · 2 of 2 positions shown · non-contrast
Comparison: None.

CLINICAL DATA: Knee pain, initial encounter

EXAM:
LEFT KNEE - 1-2 VIEW

[Series 1: dg knee 1-2 views left · 0.14mm/px · 2 of 2 slices shown]
[im 1/2]
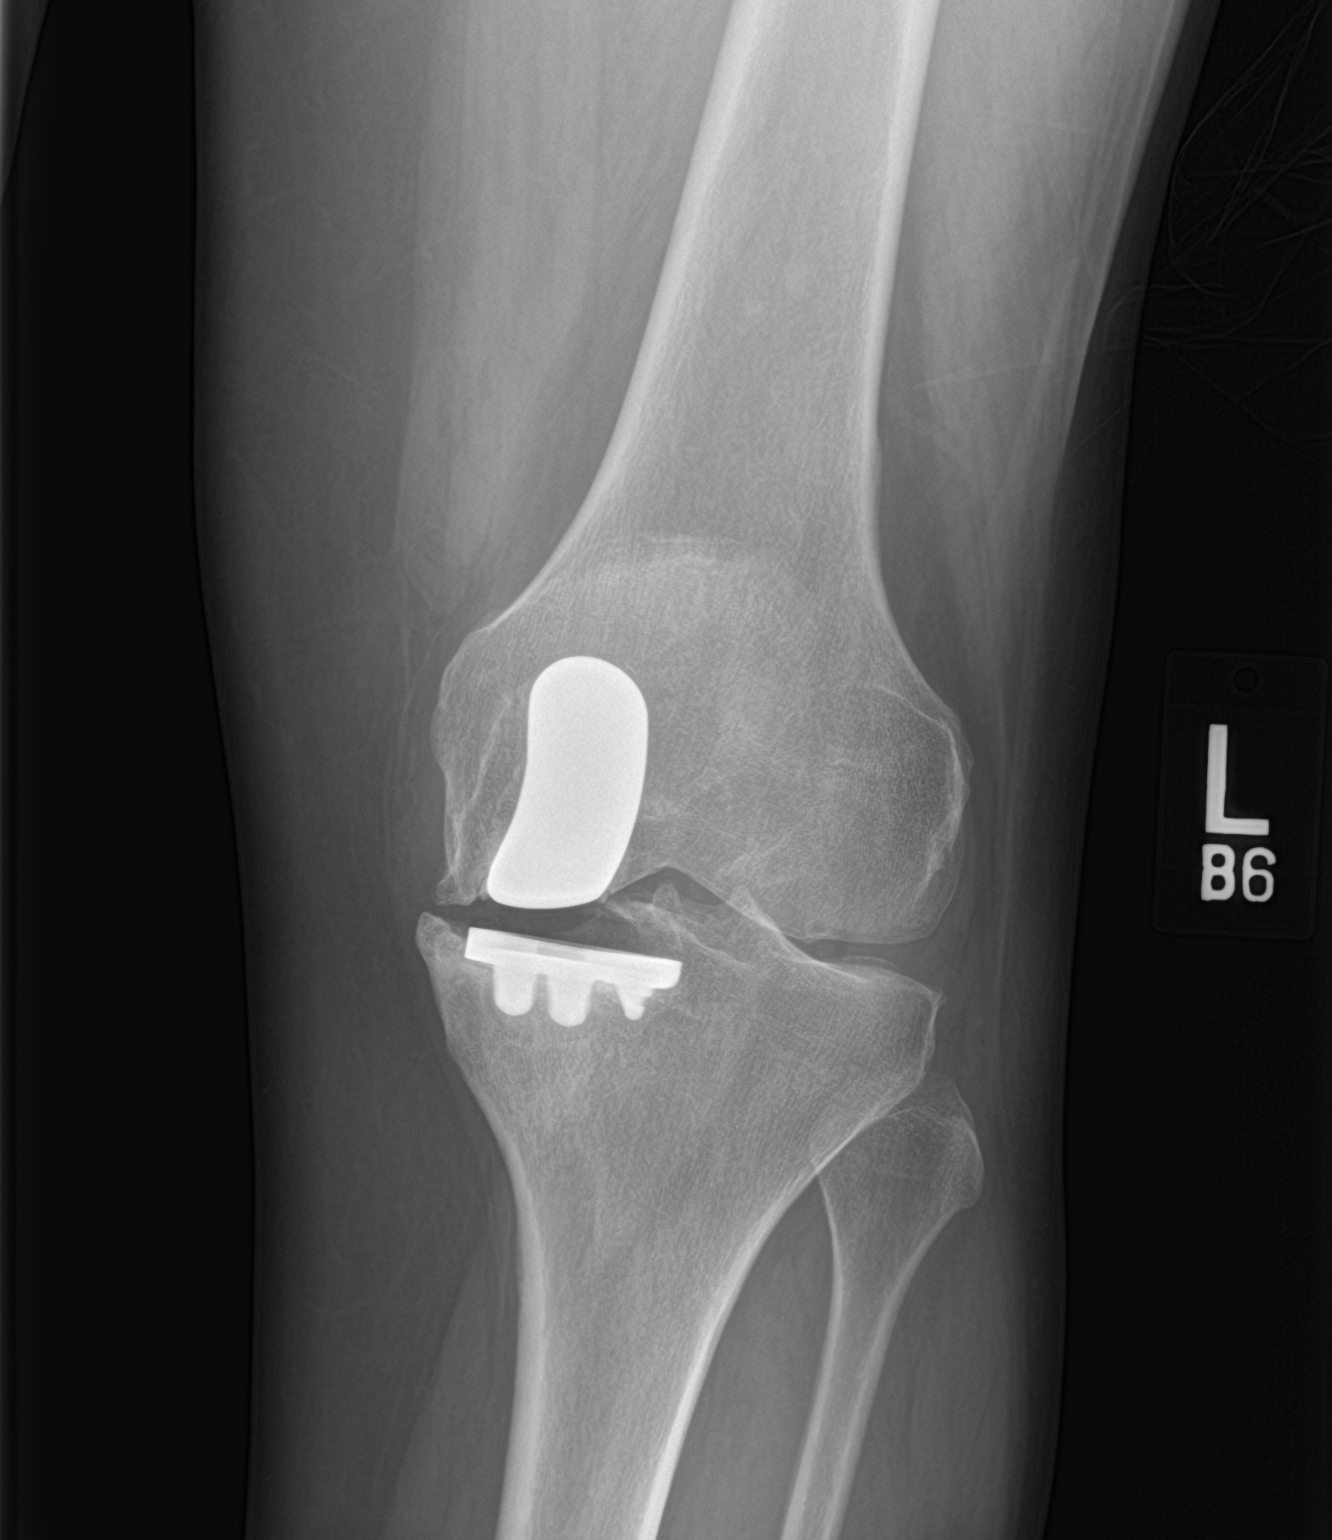
[im 2/2]
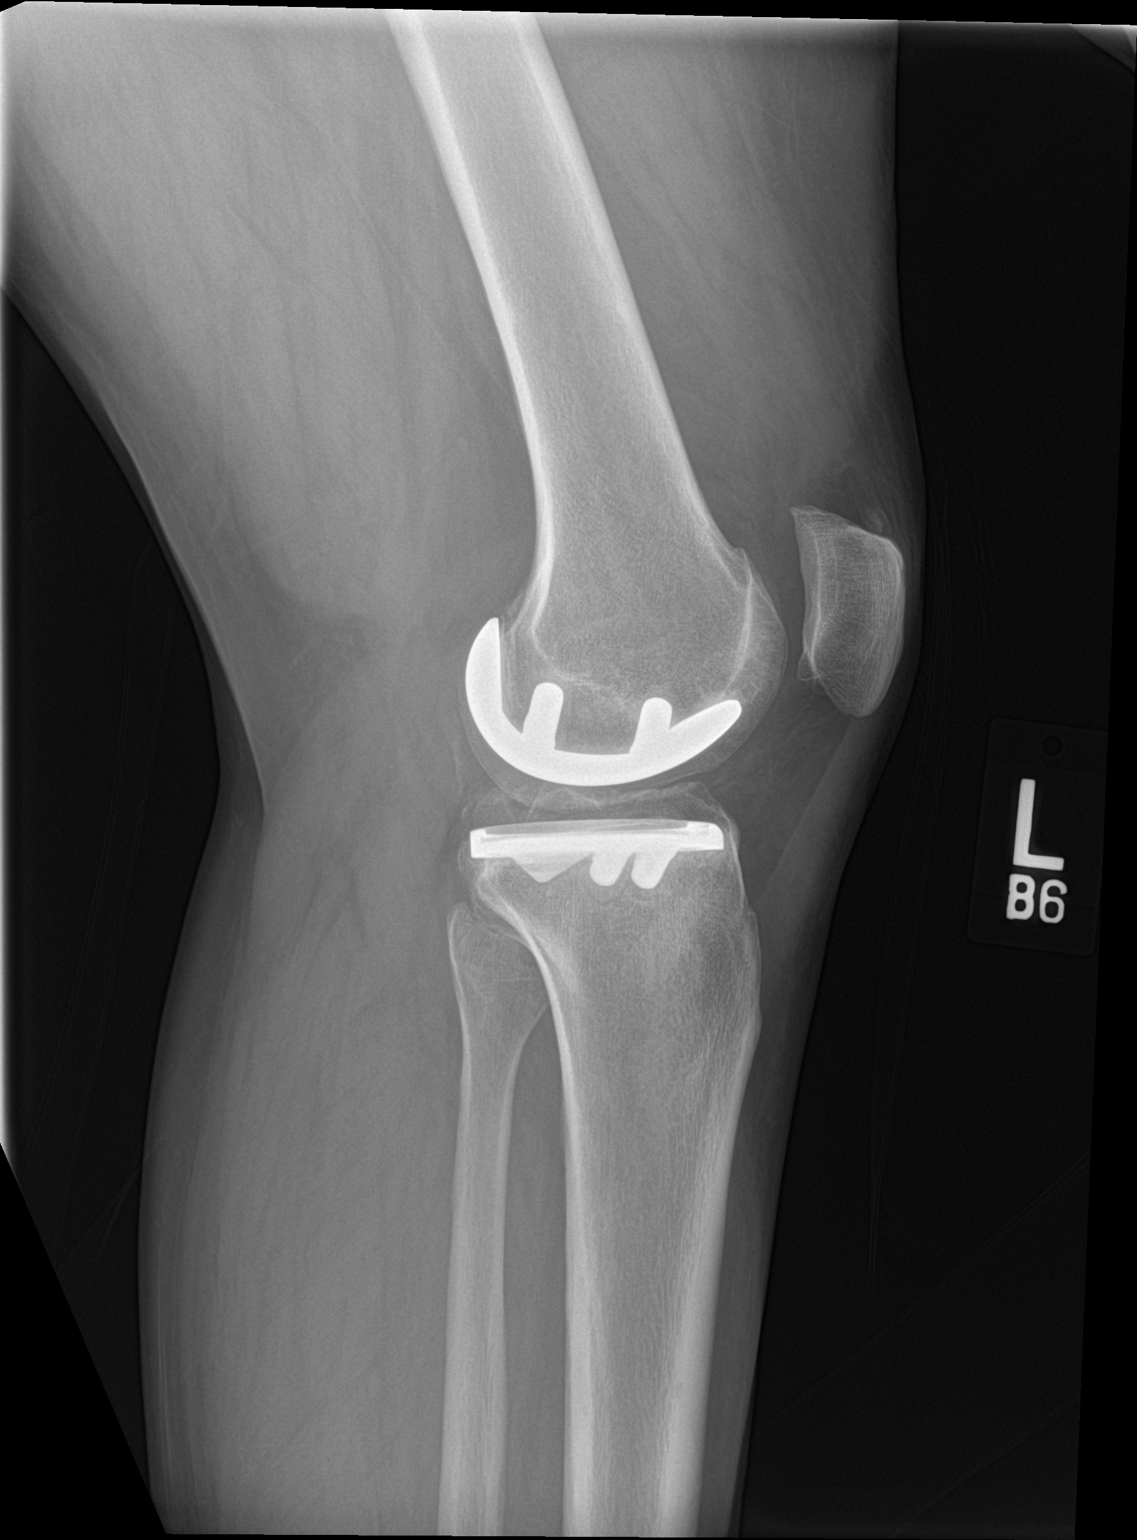

[2 of 2 positions shown; findings below may reference images not displayed]

FINDINGS: Partial medial knee replacement is noted. No loosening is seen. No
joint effusion is noted. Persistent spurring is noted along the
medial aspect of femur and tibia. Mild patellofemoral degenerative
changes are noted as well.
IMPRESSION: Postsurgical and degenerative changes without acute abnormality.

## 2019-02-04 ENCOUNTER — Ambulatory Visit: Payer: Self-pay | Admitting: Pain Medicine

## 2019-02-06 ENCOUNTER — Ambulatory Visit: Payer: Self-pay | Admitting: Pain Medicine

## 2019-02-11 ENCOUNTER — Other Ambulatory Visit: Payer: Self-pay

## 2019-02-11 ENCOUNTER — Ambulatory Visit: Payer: PPO | Attending: Pain Medicine | Admitting: Pain Medicine

## 2019-02-11 DIAGNOSIS — M5030 Other cervical disc degeneration, unspecified cervical region: Secondary | ICD-10-CM | POA: Diagnosis not present

## 2019-02-11 DIAGNOSIS — M5441 Lumbago with sciatica, right side: Secondary | ICD-10-CM

## 2019-02-11 DIAGNOSIS — M79601 Pain in right arm: Secondary | ICD-10-CM

## 2019-02-11 DIAGNOSIS — M4722 Other spondylosis with radiculopathy, cervical region: Secondary | ICD-10-CM

## 2019-02-11 DIAGNOSIS — M25561 Pain in right knee: Secondary | ICD-10-CM | POA: Diagnosis not present

## 2019-02-11 DIAGNOSIS — M5442 Lumbago with sciatica, left side: Secondary | ICD-10-CM

## 2019-02-11 DIAGNOSIS — G8929 Other chronic pain: Secondary | ICD-10-CM | POA: Diagnosis not present

## 2019-02-11 DIAGNOSIS — M79602 Pain in left arm: Secondary | ICD-10-CM

## 2019-02-11 DIAGNOSIS — M549 Dorsalgia, unspecified: Secondary | ICD-10-CM

## 2019-02-11 DIAGNOSIS — M792 Neuralgia and neuritis, unspecified: Secondary | ICD-10-CM

## 2019-02-11 DIAGNOSIS — M542 Cervicalgia: Secondary | ICD-10-CM | POA: Diagnosis not present

## 2019-02-11 DIAGNOSIS — G894 Chronic pain syndrome: Secondary | ICD-10-CM

## 2019-02-11 DIAGNOSIS — R252 Cramp and spasm: Secondary | ICD-10-CM | POA: Insufficient documentation

## 2019-02-11 DIAGNOSIS — M25562 Pain in left knee: Secondary | ICD-10-CM

## 2019-02-11 DIAGNOSIS — M7918 Myalgia, other site: Secondary | ICD-10-CM

## 2019-02-11 MED ORDER — PREGABALIN 150 MG PO CAPS: 150.0000 mg | ORAL_CAPSULE | Freq: Two times a day (BID) | ORAL | 2 refills | Status: DC

## 2019-02-11 MED ORDER — BACLOFEN 10 MG PO TABS: 10.0000 mg | ORAL_TABLET | Freq: Every day | ORAL | 2 refills | Status: DC

## 2019-02-11 NOTE — Progress Notes (Signed)
Pain Management Encounter Note - Virtual Visit via Telephone Telehealth (real-time audio visits between healthcare provider and patient).  Patient's Phone No. & Preferred Pharmacy:  [phone] (home); [phone] (mobile); (Preferred) [phone]  [facility] - [place], [place] - [address] [place] [address] Phone: [phone] Fax: [phone]   Pre-screening note:  Our staff contacted Jennifer Harmon and offered her an "in person", "face-to-face" appointment versus a telephone encounter. She indicated preferring the telephone encounter, at this time.  Reason for Virtual Visit: COVID-19*  Social distancing based on CDC and AMA recommendations.   I contacted Jennifer Harmon on 02/11/2019 at 10:04 AM by telephone and clearly identified myself as Oswaldo Done, MD. I verified that I was speaking with the correct person using two identifiers (Name and date of birth: July 20, 1961).  Advanced Informed Consent I sought verbal advanced consent from Jennifer Harmon for telemedicine interactions and virtual visit. I informed Jennifer Harmon of the security and privacy concerns, risks, and limitations associated with performing an evaluation and management service by telephone. I also informed Jennifer Harmon of the availability of "in person" appointments and I informed her of the possibility of a patient responsible charge related to this service. Ms. Jairam expressed understanding and agreed to proceed.   Historic Elements   Jennifer Harmon is a 58 y.o. year old, female patient evaluated today after her last encounter by our practice on 01/11/2019. Jennifer Harmon  has a past medical history of Acute postoperative pain (05/03/2018), Anxiety, Chronic chest pain, Depression, DJD (degenerative joint disease), Fibromyalgia, GERD (gastroesophageal reflux disease), and Hypercholesterolemia. She also  has a past surgical history that includes Cholecystectomy; Cesarean section; and  Back surgery (2008). Jennifer Harmon has a current medication list which includes the following prescription(s): baclofen, citalopram, clonazepam, cyclobenzaprine, diclofenac sodium, latanoprost, lyrica, magnesium oxide, multivitamin with minerals, oxycodone, oxycodone, oxycodone, pregabalin, ranitidine, ranitidine, and rosuvastatin. She  reports that she quit smoking about 3 years ago. Her smoking use included pipe. She has a 10.00 pack-year smoking history. She uses smokeless tobacco. She reports that she does not drink alcohol or use drugs. Jennifer Harmon is allergic to asa [aspirin]; bupropion; and tape.   HPI  I last saw her on 01/10/2019. She is being evaluated for both, medication management and a post-procedure assessment.  The patient indicates having attained 100% relief of the pain for approximately 1 week.  Unfortunately, the pain is now back.  In addition, she indicates that she needs to ambulate with a cane because otherwise she feels unstable and feels that she will be falling.  She is experiencing muscle spasms at nighttime which occur primarily in the area of her right hip.  She says that she is having difficulty laying down on her left side since she experiences pain not only in the knee, hip, but also shoulder and upper extremity.  I have reviewed the chart and I do not see any MRIs of the cervical spine, but I think we need to do 1 since the x-rays of the cervical spine were abnormal and she is describing a problem that could be associated with a cervical myelopathy.  I have asked the patient about prior history of strokes and symptoms associated with it, but she has denied the muscle.  At this point, I will increase her Lyrica from 200 mg/day to 300 mg.  I have instructed her to take her 100 mg pill 3 times a day and then we  will switch her to the 150 mg pill twice daily.  In addition, she indicated having contacted Crystal and having been told that she could go up on her pain pill to 3 times a day  and to call her when she runs out of pills.  Unfortunately, I see no documentation from Crystal about this and I was unsuccessful in getting a hold of Crystal to confirm this.  However, for now, while we have the COVID-19 restrictions, I think that this would be an appropriate course of action.  I have instructed the patient to call Crystal when she runs out of her medicine so that she can refill her oxycodone.  At the same time will have Crystal evaluate today's changes and document whether or not they were effective.  For a summary of those changes please look at the plan of care.  Post-Procedure Evaluation  Procedure: Diagnostic right-sided genicular nerve block #1 under fluoroscopic guidance and IV sedation Pre-procedure pain level:  8/10 Post-procedure: 0/10 (100% relief)  Sedation: Sedation provided.  Effectiveness during initial hour after procedure(Ultra-Short Term Relief): 100 %  Local anesthetic used: Long-acting (4-6 hours) Effectiveness: Defined as any analgesic benefit obtained secondary to the administration of local anesthetics. This carries significant diagnostic value as to the etiological location, or anatomical origin, of the pain. Duration of benefit is expected to coincide with the duration of the local anesthetic used.  Effectiveness during initial 4-6 hours after procedure(Short-Term Relief): 100 %  Long-term benefit: Defined as any relief past the pharmacologic duration of the local anesthetics.  Effectiveness past the initial 6 hours after procedure(Long-Term Relief): 100 % x 1 week  Current benefits: Defined as benefit that persist at this time.   Analgesia:  Back to baseline.  Although she had 100% relief of the pain for approximately 1 week, unfortunately she indicates that now this has worn off.  She has been having difficulties with walking around and even laying down on her right side.  She indicates having to walk with a cane because otherwise she fears that she will  be falling. Function: Back to baseline ROM: Back to baseline  Pharmacotherapy Assessment  Analgesic: Oxycodone IR 5 mg twice daily (10 mg/day of oxycodone) MME/day: 15 mg/day.   Monitoring: Pharmacotherapy: No side-effects or adverse reactions reported. [place] PMP: PDMP reviewed during this encounter.       Compliance: No problems identified. Plan: Refer to "POC".  Review of recent tests  DG C-Arm 1-60 Min-No Report Fluoroscopy was utilized by the requesting physician.  No radiographic  interpretation.    Office Visit on 12/24/2018  Component Date Value Ref Range Status  . Summary 12/24/2018 FINAL   Final   Comment: ==================================================================== TOXASSURE SELECT 13 (MW) ==================================================================== Test                             Result       Flag       Units Drug Present and Declared for Prescription Verification   7-aminoclonazepam              315          EXPECTED   ng/mg creat    7-aminoclonazepam is an expected metabolite of clonazepam. Source    of clonazepam is a scheduled prescription medication.   Oxycodone                      221  EXPECTED   ng/mg creat   Oxymorphone                    247          EXPECTED   ng/mg creat   Noroxycodone                   573          EXPECTED   ng/mg creat   Noroxymorphone                 111          EXPECTED   ng/mg creat    Sources of oxycodone are scheduled prescription medications.    Oxymorphone, noroxycodone, and noroxymorphone are expected    metabolites of oxycodone. Oxymorphone is also available as a    scheduled prescription medication. ===                          ================================================================= Test                      Result    Flag   Units      Ref Range   Creatinine              112              mg/dL      >=16 ==================================================================== Declared  Medications:  The flagging and interpretation on this report are based on the  following declared medications.  Unexpected results may arise from  inaccuracies in the declared medications.  **Note: The testing scope of this panel includes these medications:  Clonazepam  Oxycodone  **Note: The testing scope of this panel does not include following  reported medications:  Citalopram  Cyclobenzaprine  Latanoprost  Magnesium Oxide  Multivitamin  Pregabalin  Ranitidine  Rosuvastatin ==================================================================== For clinical consultation, please call [phone]. ====================================================================    Assessment  The primary encounter diagnosis was Chronic pain syndrome. Diagnoses of Chronic upper back pain (Primary Area of Pain) (Bilateral) (R>L), Chronic low back pain  (Secondary Area of Pain) (Bilateral) (R>L), Chronic neck pain (Tertiary Area of Pain) (Bilateral) (R>L), Chronic knee pain (Fourth Area of Pain) (Bilateral) (R>L), Cervicalgia, Chronic upper extremity pain (Bilateral) (R>L), DDD (degenerative disc disease), cervical, Cervical spondylosis with radiculopathy (Right), Night cramps, Chronic musculoskeletal pain, and Neurogenic pain were also pertinent to this visit.  Plan of Care  I am having Jennifer Mandes. Harmon start on pregabalin and baclofen. I am also having her maintain her citalopram, clonazePAM, latanoprost, ranitidine, ranitidine, rosuvastatin, multivitamin with minerals, cyclobenzaprine, Lyrica, Magnesium Oxide, oxyCODONE, oxyCODONE, oxyCODONE, and diclofenac sodium.  Pharmacotherapy (Medications Ordered): Meds ordered this encounter  Medications  . pregabalin (LYRICA) 150 MG capsule    Sig: Take 1 capsule (150 mg total) by mouth 2 (two) times daily.    Dispense:  60 capsule    Refill:  2    Do not place this medication, or any other prescription from our practice, on "Automatic Refill".  Patient may have prescription filled one day early if pharmacy is closed on scheduled refill date.  . baclofen (LIORESAL) 10 MG tablet    Sig: Take 1-2 tablets (10-20 mg total) by mouth at bedtime.    Dispense:  60 tablet    Refill:  2    Do not place this medication, or any other prescription from our practice, on "Automatic Refill". Patient may  have prescription filled one day early if pharmacy is closed on scheduled refill date.   Orders:  Orders Placed This Encounter  Procedures  . Cervical Epidural Injection    Level(s): C7-T1 Laterality: Right-sided Purpose: Diagnostic Indication(s): Radiculitis and cervicalgia associater with cervical degenerative disc disease.    Standing Status:   Future    Standing Expiration Date:   03/13/2019    Scheduling Instructions:     Procedure: Cervical Epidural Steroid Injection/Block     Sedation: With Sedation.     Timeframe: As soon as schedule allows    Order Specific Question:   Where will this procedure be performed?    Answer:   [facility]    Comments:   by Dr. Laban Emperor  . MR CERVICAL SPINE WO CONTRAST    In addition to any acute findings, please report on degenerative changes related to: (Please specify level(s)) (1) ROM & instability (>60mm displacement) (2) Facet joint (Zygoapophyseal Joint) (3) DDD and/or IVDD (4) Pars defects (5) Previous surgical changes (Include description of hardware and hardware status, if present) (6) Presence and degree of spondylolisthesis, spondylosis, and/or spondyloarthropathies)  (7) Old Fractures (8) Demineralization (9) Additional bone pathology (10) Stenosis (Central, Lateral Recess, Foraminal) (11) If at all possible, please provide AP diameter (mm) of foraminal and/or central canal.    Standing Status:   Future    Standing Expiration Date:   05/13/2019    Order Specific Question:   What is the patient's sedation requirement?    Answer:   No Sedation    Order Specific Question:   Does the  patient have a pacemaker or implanted devices?    Answer:   No    Order Specific Question:   Preferred imaging location?    Answer:   [facility] (table limit-350lbs)    Order Specific Question:   Call Results- Best Contact Number?    Answer:   [phone] [facility])    Order Specific Question:   Radiology Contrast Protocol - do NOT remove file path    Answer:   [email or web address]    Order Specific Question:   ** REASON FOR EXAM (FREE TEXT)    Answer:   Neck Pain & Radiculitis   Follow-up plan:   Return for Procedure (w/ sedation): (R) CESI #1 +   Med-Mgmt w/ NP.  1.  I have instructed the patient to increase her Lyrica 100 mg from twice daily to 3 times daily.  I have also E scribed a prescription for 150 mg p.o. twice daily.  This should help the patient with finishing the pills that she has at home before she starts the new ones. 2.  Today I will add baclofen 10-20 mg p.o. at bedtime to see if we can help her with her night cramps. 3.  The patient has gone up on her oxycodone from twice daily to 3 times daily and she indicates that this was after having consulted Thad Ranger, NP, and having obtained her okay to do so.  Because her original prescription was for twice daily, she will be running out of medication early.  Crystal told her to call her when she runs out of medication so that she can re-write her new prescriptions, reflecting the new change in her oxycodone. 4.  I will have the patient follow-up with Crystal for her oxycodone refill and at that time, we will have Crystal review her new medication changes and document its effectiveness.  Once the COVID-19 restrictions are lifted,  we will again resume her interventional therapies. 5.  Because the description of the patient's pain could be secondary to a cervical myelopathy, and because her cervical x-rays were abnormal, we need to plan on doing an MRI of the cervical spine.  She is having  what appears to be cervical radicular symptoms through her right upper extremity.   Considering: Diagnostic Trigger point injections Diagnostic bilateral LESI Diagnostic bilateral lumbar facet nerve block #3 Possiblebilateral lumbar facet RFA Possiblebilateral Racz(Epidural Neurolysis) Diagnostic thoracic facet nerve block Possible thoracic facet RFA Diagnostic bilateral CESI Diagnosticbilateral cervical facet nerve block Possiblebilateral cervical facet RFA Diagnostic right shoulder intra-articular injection Diagnostic right suprascapular nerve block Possibleright suprascapular RFA Diagnostic bilateral genicular nerve block Possible bilateral genicular RFA   Palliative PRN treatment(s): None at this time   I discussed the assessment and treatment plan with the patient. The patient was provided an opportunity to ask questions and all were answered. The patient agreed with the plan and demonstrated an understanding of the instructions.  Patient advised to call back or seek an in-person evaluation if the symptoms or condition worsens.  Total duration of non-face-to-face encounter: 20 minutes.  Note by: Oswaldo DoneFrancisco A Alliya Marcon, MD Date: 02/11/2019; Time: 10:04 AM  Disclaimer:  * Given the special circumstances of the COVID-19 pandemic, the federal government has announced that the Office for Civil Rights (OCR) will exercise its enforcement discretion and will not impose penalties on physicians using telehealth in the event of noncompliance with regulatory requirements under the [employer] Insurance Portability and Accountability Act (HIPAA) in connection with the good faith provision of telehealth during the COVID-19 national public health emergency. (AMA)

## 2019-02-11 NOTE — Patient Instructions (Signed)

## 2019-02-15 DIAGNOSIS — M25561 Pain in right knee: Secondary | ICD-10-CM | POA: Diagnosis not present

## 2019-02-15 DIAGNOSIS — Z96651 Presence of right artificial knee joint: Secondary | ICD-10-CM | POA: Diagnosis not present

## 2019-02-19 ENCOUNTER — Telehealth: Payer: Self-pay | Admitting: Pain Medicine

## 2019-02-19 NOTE — Telephone Encounter (Signed)
Jennifer Harmon,         Please schedule this patient for a virtual e-visit with Dr. Laban Emperor.

## 2019-02-19 NOTE — Telephone Encounter (Signed)
Patient called stating she is in a lot of pain. New muscle relaxers are not helping and her hip is extreme pain. Patient was in tears. Please call to assess. Wants to know if Dr. Laban Emperor can change or adjust meds.

## 2019-02-19 NOTE — Telephone Encounter (Signed)
Set patient up for Virtual Visit Call 02-20-19 at 9:30

## 2019-02-20 ENCOUNTER — Other Ambulatory Visit: Payer: Self-pay

## 2019-02-20 ENCOUNTER — Ambulatory Visit: Payer: PPO | Attending: Pain Medicine | Admitting: Pain Medicine

## 2019-02-20 DIAGNOSIS — M4724 Other spondylosis with radiculopathy, thoracic region: Secondary | ICD-10-CM

## 2019-02-20 DIAGNOSIS — M25551 Pain in right hip: Secondary | ICD-10-CM

## 2019-02-20 DIAGNOSIS — M5442 Lumbago with sciatica, left side: Secondary | ICD-10-CM | POA: Diagnosis not present

## 2019-02-20 DIAGNOSIS — M4722 Other spondylosis with radiculopathy, cervical region: Secondary | ICD-10-CM | POA: Diagnosis not present

## 2019-02-20 DIAGNOSIS — T39395A Adverse effect of other nonsteroidal anti-inflammatory drugs [NSAID], initial encounter: Secondary | ICD-10-CM

## 2019-02-20 DIAGNOSIS — K219 Gastro-esophageal reflux disease without esophagitis: Secondary | ICD-10-CM

## 2019-02-20 DIAGNOSIS — M5134 Other intervertebral disc degeneration, thoracic region: Secondary | ICD-10-CM | POA: Diagnosis not present

## 2019-02-20 DIAGNOSIS — M5030 Other cervical disc degeneration, unspecified cervical region: Secondary | ICD-10-CM

## 2019-02-20 DIAGNOSIS — M5414 Radiculopathy, thoracic region: Secondary | ICD-10-CM

## 2019-02-20 DIAGNOSIS — Z96651 Presence of right artificial knee joint: Secondary | ICD-10-CM

## 2019-02-20 DIAGNOSIS — M549 Dorsalgia, unspecified: Secondary | ICD-10-CM | POA: Diagnosis not present

## 2019-02-20 DIAGNOSIS — M160 Bilateral primary osteoarthritis of hip: Secondary | ICD-10-CM

## 2019-02-20 DIAGNOSIS — G8929 Other chronic pain: Secondary | ICD-10-CM

## 2019-02-20 DIAGNOSIS — K2960 Other gastritis without bleeding: Secondary | ICD-10-CM

## 2019-02-20 DIAGNOSIS — M47816 Spondylosis without myelopathy or radiculopathy, lumbar region: Secondary | ICD-10-CM | POA: Diagnosis not present

## 2019-02-20 DIAGNOSIS — M4810 Ankylosing hyperostosis [Forestier], site unspecified: Secondary | ICD-10-CM

## 2019-02-20 DIAGNOSIS — M792 Neuralgia and neuritis, unspecified: Secondary | ICD-10-CM

## 2019-02-20 DIAGNOSIS — M961 Postlaminectomy syndrome, not elsewhere classified: Secondary | ICD-10-CM | POA: Diagnosis not present

## 2019-02-20 DIAGNOSIS — M542 Cervicalgia: Secondary | ICD-10-CM

## 2019-02-20 DIAGNOSIS — G894 Chronic pain syndrome: Secondary | ICD-10-CM | POA: Diagnosis not present

## 2019-02-20 DIAGNOSIS — M7918 Myalgia, other site: Secondary | ICD-10-CM

## 2019-02-20 DIAGNOSIS — M25561 Pain in right knee: Secondary | ICD-10-CM

## 2019-02-20 DIAGNOSIS — M25562 Pain in left knee: Secondary | ICD-10-CM

## 2019-02-20 DIAGNOSIS — M62838 Other muscle spasm: Secondary | ICD-10-CM

## 2019-02-20 DIAGNOSIS — M797 Fibromyalgia: Secondary | ICD-10-CM

## 2019-02-20 DIAGNOSIS — M25552 Pain in left hip: Secondary | ICD-10-CM

## 2019-02-20 DIAGNOSIS — M5441 Lumbago with sciatica, right side: Secondary | ICD-10-CM

## 2019-02-20 MED ORDER — MAGNESIUM OXIDE -MG SUPPLEMENT 500 MG PO CAPS: 1.0000 | ORAL_CAPSULE | Freq: Every day | ORAL | 2 refills | Status: DC

## 2019-02-20 MED ORDER — OXYCODONE HCL 5 MG PO TABS: 5.0000 mg | ORAL_TABLET | Freq: Three times a day (TID) | ORAL | 0 refills | Status: DC | PRN

## 2019-02-20 MED ORDER — PREGABALIN 150 MG PO CAPS: 150.0000 mg | ORAL_CAPSULE | Freq: Three times a day (TID) | ORAL | 2 refills | Status: DC | PRN

## 2019-02-20 MED ORDER — CYCLOBENZAPRINE HCL 10 MG PO TABS: 10.0000 mg | ORAL_TABLET | Freq: Three times a day (TID) | ORAL | 2 refills | Status: DC

## 2019-02-20 MED ORDER — PREDNISONE 20 MG PO TABS: ORAL_TABLET | ORAL | 0 refills | Status: AC

## 2019-02-20 MED ORDER — ESOMEPRAZOLE MAGNESIUM 40 MG PO CPDR: 40.0000 mg | DELAYED_RELEASE_CAPSULE | Freq: Every day | ORAL | 2 refills | Status: DC

## 2019-02-20 NOTE — Progress Notes (Signed)
Pain Management Virtual Encounter Note - Virtual Visit via Telephone Telehealth (real-time audio visits between healthcare provider and patient).  Patient's Phone No. & Preferred Pharmacy:  [phone] (home); [phone] (mobile); (Preferred) [phone]  [facility] - [place], [place] - [address] [place] [address] Phone: [phone] Fax: [phone]   Pre-screening note:  Our staff contacted Jennifer Harmon and offered her an "in person", "face-to-face" appointment versus a telephone encounter. She indicated preferring the telephone encounter, at this time.  Reason for Virtual Visit: COVID-19*  Social distancing based on CDC and AMA recommendations.   I contacted Jennifer Harmon on 02/20/2019 at 11:27 AM by telephone and clearly identified myself as Oswaldo DoneFrancisco A Mehul Rudin, MD. I verified that I was speaking with the correct person using two identifiers (Name and date of birth: 04/10/1961).  Advanced Informed Consent I sought verbal advanced consent from Jennifer Harmon for telemedicine interactions and virtual visit. I informed Jennifer Harmon of the security and privacy concerns, risks, and limitations associated with performing an evaluation and management service by telephone. I also informed Jennifer Harmon of the availability of "in person" appointments and I informed her of the possibility of a patient responsible charge related to this service. Jennifer Harmon expressed understanding and agreed to proceed.   Historic Elements   Jennifer Harmon is a 58 y.o. year old, female patient evaluated today after her last encounter by our practice on 02/19/2019. Jennifer Harmon  has a past medical history of Acute postoperative pain (05/03/2018), Anxiety, Chronic chest pain, Depression, DJD (degenerative joint disease), Fibromyalgia, GERD (gastroesophageal reflux disease), and Hypercholesterolemia. She also  has a past surgical history that includes Cholecystectomy; Cesarean  section; and Back surgery (2008). Jennifer Harmon has a current medication list which includes the following prescription(s): baclofen, clonazepam, cyclobenzaprine, esomeprazole, latanoprost, magnesium oxide, multivitamin with minerals, oxycodone, oxycodone, oxycodone, prednisone, pregabalin, and rosuvastatin. She  reports that she quit smoking about 3 years ago. Her smoking use included pipe. She has a 10.00 pack-year smoking history. She uses smokeless tobacco. She reports that she does not drink alcohol or use drugs. Jennifer Harmon is allergic to asa [aspirin]; bupropion; and tape.   HPI  I last saw her on 02/19/2019. She is being evaluated for worsening of previously established problem.  On the last visit:  1.  I instructed the patient to increase her Lyrica 100 mg from twice daily to 3 times daily.  I have also e-scribed a prescription for 150 mg p.o. twice daily.  This should help the patient with finishing the pills that she had at home before she starts the new ones. 2.  I also added baclofen 10-20 mg p.o. at bedtime to see if this could help her with her night cramps. 3.  The patient had gone up on her oxycodone from twice daily to 3 times daily and she indicates that this was after having consulted Jennifer Rangerrystal King, NP, and obtained her permission to increase it.  Because her original prescription was for twice daily, she ran out of medication early.  Jennifer told her to call her when she did so that she could re-write her prescription, reflecting the new dose and schedule. 4.  I instructed the patient follow-up with Jennifer for her oxycodone refill and at that time.  The plan was to have Jennifer review her medication changes and document its effectiveness.  Once the COVID-19 restrictions are lifted, the plan is to resume her interventional therapies. 5.  Because the description of the patient's pain could be secondary to a cervical myelopathy, and because her cervical x-rays were abnormal, we need to plan  on doing an MRI of the cervical spine. Unfortunately, due to the COVID-19 restrictions they are not performing elective MRIs.  She is having what appears to be cervical radicular symptoms through her right upper extremity.  I have scheduled the patient to come in for a right-sided cervical epidural steroid injection #1 under fluoroscopic guidance and IV sedation, as soon as the COVID-19 restrictions are lifted.  Today I had a very long conversation with this patient on a having had her take clear notes of what I want her to do: 1.  increase Lyrica 150 mg 2 3 times daily. 2.  increase Flexeril 10 mg to 3 times daily. 3.  continue taking baclofen 20 mg at bedtime. 4.  Nexium 40 mg 1 at bedtime. 5.  magnesium 500 mg 1 tablet p.o. at bedtime. 6.  increase oxycodone to 5 mg every 8 hours. 7.  take 1 glass of Gatorade 3 times a day with meals. 8.  take 1 glass of tonic water at bedtime.  In addition to this, I went ahead and I sent her a prescription for a steroid taper.  She was instructed not to take any over-the-counter ibuprofen while taking the steroid.  However, she was told that once she finishes the steroid, she may supplement her analgesia with ibuprofen 200 mg 2 tablets up to a maximum of 3 times a day, but she should not sustain that for more than 2 to 3 days.  She understood and accepted and she repeated back everything that I set correctly.  Pharmacotherapy Assessment  Analgesic: Oxycodone IR 5 mg 1 tablet p.o. every 8 hours (15 mg/day of oxycodone) MME/day: 22.5 mg/day.   Monitoring: Pharmacotherapy: No side-effects or adverse reactions reported. [place] PMP: PDMP reviewed during this encounter.       Compliance: No problems identified or detected. Plan: Refer to "POC".  Review of recent tests  DG C-Arm 1-60 Min-No Report Fluoroscopy was utilized by the requesting physician.  No radiographic  interpretation.    Office Visit on 12/24/2018  Component Date Value Ref Range Status  .  Summary 12/24/2018 FINAL   Final   Comment: ==================================================================== TOXASSURE SELECT 13 (MW) ==================================================================== Test                             Result       Flag       Units Drug Present and Declared for Prescription Verification   7-aminoclonazepam              315          EXPECTED   ng/mg creat    7-aminoclonazepam is an expected metabolite of clonazepam. Source    of clonazepam is a scheduled prescription medication.   Oxycodone                      221          EXPECTED   ng/mg creat   Oxymorphone                    247          EXPECTED   ng/mg creat   Noroxycodone                   573  EXPECTED   ng/mg creat   Noroxymorphone                 111          EXPECTED   ng/mg creat    Sources of oxycodone are scheduled prescription medications.    Oxymorphone, noroxycodone, and noroxymorphone are expected    metabolites of oxycodone. Oxymorphone is also available as a    scheduled prescription medication. ===                          ================================================================= Test                      Result    Flag   Units      Ref Range   Creatinine              112              mg/dL      >=40 ==================================================================== Declared Medications:  The flagging and interpretation on this report are based on the  following declared medications.  Unexpected results may arise from  inaccuracies in the declared medications.  **Note: The testing scope of this panel includes these medications:  Clonazepam  Oxycodone  **Note: The testing scope of this panel does not include following  reported medications:  Citalopram  Cyclobenzaprine  Latanoprost  Magnesium Oxide  Multivitamin  Pregabalin  Ranitidine  Rosuvastatin ==================================================================== For clinical consultation, please  call [phone]. ====================================================================    Assessment  The primary encounter diagnosis was Chronic pain syndrome. Diagnoses of Chronic upper back pain (Primary Area of Pain) (Bilateral) (R>L), DDD (degenerative disc disease), thoracic, DISH (diffuse idiopathic skeletal hyperostosis), Thoracic radiculitis (T6/T7) (Right), Thoracic radiculopathy due to osteoarthritis of spine, Chronic low back pain  (Secondary Area of Pain) (Bilateral) (R>L), Failed back surgical syndrome, Lumbar facet syndrome (Bilateral) (R>L), Chronic neck pain (Tertiary Area of Pain) (Bilateral) (R>L), DDD (degenerative disc disease), cervical, Cervical spondylosis with radiculopathy (Right), Chronic knee pain (Fourth Area of Pain) (Bilateral) (R>L), Chronic knee pain after total replacement (TKR) (Right), Osteoarthritis of hip (Bilateral) (R>L), Chronic hip pain (Bilateral) (R>L), Chronic musculoskeletal pain, Muscle spasms of both lower extremities, Fibromyalgia, Neurogenic pain, Gastroesophageal reflux disease without esophagitis, and NSAID induced gastritis were also pertinent to this visit.  Plan of Care  I have discontinued Fritzi Mandes. [place]'s citalopram, ranitidine, ranitidine, and Lyrica. I have also changed her oxyCODONE, oxyCODONE, cyclobenzaprine, Magnesium Oxide, pregabalin, and oxyCODONE. Additionally, I am having her start on predniSONE and esomeprazole. Lastly, I am having her maintain her clonazePAM, latanoprost, rosuvastatin, multivitamin with minerals, and baclofen.  Pharmacotherapy (Medications Ordered): Meds ordered this encounter  Medications  . predniSONE (DELTASONE) 20 MG tablet    Sig: Take 3 tab(s) in the morning x 5 days, then 2 tab(s) x 5 days, followed by 1 tab x 5 days.    Dispense:  32 tablet    Refill:  0  . oxyCODONE (OXY IR/ROXICODONE) 5 MG immediate release tablet    Sig: Take 1 tablet (5 mg total) by mouth every 8 (eight) hours as needed for up  to 30 days for severe pain. Must last 30 days. Max: 3/day.    Dispense:  90 tablet    Refill:  0    [place] STOP ACT - Not applicable to Chronic Pain Syndrome (G89.4) diagnosis. Fill one day early if pharmacy is closed on scheduled refill date. Do not fill  until: 03/22/19. To last until: 04/21/19.  [name]. oxyCODONE (OXY IR/ROXICODONE) 5 MG immediate release tablet    Sig: Take 1 tablet (5 mg total) by mouth every 8 (eight) hours as needed for up to 30 days for severe pain. Must last 30 days. Max: 3/day.    Dispense:  90 tablet    Refill:  0    [place] STOP ACT - Not applicable to Chronic Pain Syndrome (G89.4) diagnosis. Fill one day early if pharmacy is closed on scheduled refill date. Do not fill until: 02/20/19. To last until: 03/22/19.  [name]. cyclobenzaprine (FLEXERIL) 10 MG tablet    Sig: Take 1 tablet (10 mg total) by mouth 3 (three) times daily.    Dispense:  90 tablet    Refill:  2    Do not place medication on "Automatic Refill". Fill one day early if pharmacy is closed on scheduled refill date.  . Magnesium Oxide 500 MG CAPS    Sig: Take 1 capsule (500 mg total) by mouth at bedtime.    Dispense:  30 capsule    Refill:  2    Do not place medication on "Automatic Refill". Fill one day early if pharmacy is closed on scheduled refill date.  . pregabalin (LYRICA) 150 MG capsule    Sig: Take 1 capsule (150 mg total) by mouth every 8 (eight) hours as needed.    Dispense:  90 capsule    Refill:  2    Do not place this medication, or any other prescription from our practice, on "Automatic Refill". Patient may have prescription filled one day early if pharmacy is closed on scheduled refill date.  [name]. oxyCODONE (OXY IR/ROXICODONE) 5 MG immediate release tablet    Sig: Take 1 tablet (5 mg total) by mouth every 8 (eight) hours as needed for up to 30 days for severe pain. Must last 30 days. Max: 3/day.    Dispense:  60 tablet    Refill:  0    [place] STOP ACT - Not applicable to Chronic Pain Syndrome (G89.4) diagnosis.  Fill one day early if pharmacy is closed on scheduled refill date. Do not fill until: 04/21/19. To last until: 05/21/19.  [name]. esomeprazole (NEXIUM) 40 MG capsule    Sig: Take 1 capsule (40 mg total) by mouth at bedtime. Take while on ibuprofen.    Dispense:  30 capsule    Refill:  2    Do not place medication on "Automatic Refill". Fill one day early if pharmacy is closed on scheduled refill date.   Orders:  Orders Placed This Encounter  Procedures  . Cervical Epidural Injection    Level(s): C7-T1 Laterality: Right-sided Purpose: Diagnostic Indication(s): Radiculitis and cervicalgia associater with cervical degenerative disc disease.    Standing Status:   Future    Standing Expiration Date:   03/22/2019    Scheduling Instructions:     Procedure: Cervical Epidural Steroid Injection/Block     Sedation: With Sedation.     Timeframe: As soon as schedule allows    Order Specific Question:   Where will this procedure be performed?    Answer:   [facility]    Comments:   by Dr. Laban EmperorNaveira   Follow-up plan:   Return in about 3 months (around 05/22/2019) for Med-Mgmt w/ NP +  Procedure (w/ sedation): (R) CESI #1 (ASAA).   Follow-up plan:   Return for Procedure (w/ sedation): (R) CESI #1 +   Med-Mgmt w/ NP.     Considering:  Diagnostic Trigger point injections Diagnostic bilateral LESI Diagnostic bilateral lumbar facet nerve block #3 Possiblebilateral lumbar facet RFA Possiblebilateral Racz(Epidural Neurolysis) Diagnostic thoracic facet nerve block Possible thoracic facet RFA Diagnostic bilateral CESI Diagnosticbilateral cervical facet nerve block Possiblebilateral cervical facet RFA Diagnostic right shoulder intra-articular injection Diagnostic right suprascapular nerve block Possibleright suprascapular RFA Diagnostic bilateral genicular nerve block Possible bilateral genicular RFA   Palliative PRN treatment(s): None at this time   I discussed the  assessment and treatment plan with the patient. The patient was provided an opportunity to ask questions and all were answered. The patient agreed with the plan and demonstrated an understanding of the instructions.  Patient advised to call back or seek an in-person evaluation if the symptoms or condition worsens.  Total duration of non-face-to-face encounter: 40 minutes.  Note by: Oswaldo Done, MD Date: 02/20/2019; Time: 11:27 AM  Disclaimer:  * Given the special circumstances of the COVID-19 pandemic, the federal government has announced that the Office for Civil Rights (OCR) will exercise its enforcement discretion and will not impose penalties on physicians using telehealth in the event of noncompliance with regulatory requirements under the [employer] Portability and Accountability Act (HIPAA) in connection with the good faith provision of telehealth during the COVID-19 national public health emergency. (AMA)

## 2019-02-20 NOTE — Patient Instructions (Addendum)
____________________________________________________________________________________________  Preparing for Procedure with Sedation  Procedure appointments are limited to planned procedures: . No Prescription Refills. . No disability issues will be discussed. . No medication changes will be discussed.  Instructions: . Oral Intake: Do not eat or drink anything for at least 8 hours prior to your procedure. . Transportation: Public transportation is not allowed. Bring an adult driver. The driver must be physically present in our waiting room before any procedure can be started. . Physical Assistance: Bring an adult physically capable of assisting you, in the event you need help. This adult should keep you company at home for at least 6 hours after the procedure. . Blood Pressure Medicine: Take your blood pressure medicine with a sip of water the morning of the procedure. . Blood thinners: Notify our staff if you are taking any blood thinners. Depending on which one you take, there will be specific instructions on how and when to stop it. . Diabetics on insulin: Notify the staff so that you can be scheduled 1st case in the morning. If your diabetes requires high dose insulin, take only  of your normal insulin dose the morning of the procedure and notify the staff that you have done so. . Preventing infections: Shower with an antibacterial soap the morning of your procedure. . Build-up your immune system: Take 1000 mg of Vitamin C with every meal (3 times a day) the day prior to your procedure. . Antibiotics: Inform the staff if you have a condition or reason that requires you to take antibiotics before dental procedures. . Pregnancy: If you are pregnant, call and cancel the procedure. . Sickness: If you have a cold, fever, or any active infections, call and cancel the procedure. . Arrival: You must be in the facility at least 30 minutes prior to your scheduled procedure. . Children: Do not bring  children with you. . Dress appropriately: Bring dark clothing that you would not mind if they get stained. . Valuables: Do not bring any jewelry or valuables.  Reasons to call and reschedule or cancel your procedure: (Following these recommendations will minimize the risk of a serious complication.) . Surgeries: Avoid having procedures within 2 weeks of any surgery. (Avoid for 2 weeks before or after any surgery). . Flu Shots: Avoid having procedures within 2 weeks of a flu shots or . (Avoid for 2 weeks before or after immunizations). . Barium: Avoid having a procedure within 7-10 days after having had a radiological study involving the use of radiological contrast. (Myelograms, Barium swallow or enema study). . Heart attacks: Avoid any elective procedures or surgeries for the initial 6 months after a "Myocardial Infarction" (Heart Attack). . Blood thinners: It is imperative that you stop these medications before procedures. Let us know if you if you take any blood thinner.  . Infection: Avoid procedures during or within two weeks of an infection (including chest colds or gastrointestinal problems). Symptoms associated with infections include: Localized redness, fever, chills, night sweats or profuse sweating, burning sensation when voiding, cough, congestion, stuffiness, runny nose, sore throat, diarrhea, nausea, vomiting, cold or Flu symptoms, recent or current infections. It is specially important if the infection is over the area that we intend to treat. . Heart and lung problems: Symptoms that may suggest an active cardiopulmonary problem include: cough, chest pain, breathing difficulties or shortness of breath, dizziness, ankle swelling, uncontrolled high or unusually low blood pressure, and/or palpitations. If you are experiencing any of these symptoms, cancel your procedure and contact   your primary care physician for an evaluation.  Remember:  Regular Business hours are:  Monday to Thursday  8:00 AM to 4:00 PM  Provider's Schedule: Beauford Lando, MD:  Procedure days: Tuesday and Thursday 7:30 AM to 4:00 PM  Bilal Lateef, MD:  Procedure days: Monday and Wednesday 7:30 AM to 4:00 PM ____________________________________________________________________________________________   ____________________________________________________________________________________________  Medication Rules  Purpose: To inform patients, and their family members, of our rules and regulations.  Applies to: All patients receiving prescriptions (written or electronic).  Pharmacy of record: Pharmacy where electronic prescriptions will be sent. If written prescriptions are taken to a different pharmacy, please inform the nursing staff. The pharmacy listed in the electronic medical record should be the one where you would like electronic prescriptions to be sent.  Electronic prescriptions: In compliance with the [place] Strengthen Opioid Misuse Prevention (STOP) Act of 2017 (Session Law 2017-74/H243), effective November 07, 2018, all controlled substances must be electronically prescribed. Calling prescriptions to the pharmacy will cease to exist.  Prescription refills: Only during scheduled appointments. Applies to all prescriptions.  NOTE: The following applies primarily to controlled substances (Opioid* Pain Medications).   Patient's responsibilities: 1. Pain Pills: Bring all pain pills to every appointment (except for procedure appointments). 2. Pill Bottles: Bring pills in original pharmacy bottle. Always bring the newest bottle. Bring bottle, even if empty. 3. Medication refills: You are responsible for knowing and keeping track of what medications you take and those you need refilled. The day before your appointment: write a list of all prescriptions that need to be refilled. The day of the appointment: give the list to the admitting nurse. Prescriptions will be written only during  appointments. No prescriptions will be written on procedure days. If you forget a medication: it will not be "Called in", "Faxed", or "electronically sent". You will need to get another appointment to get these prescribed. No early refills. Do not call asking to have your prescription filled early. 4. Prescription Accuracy: You are responsible for carefully inspecting your prescriptions before leaving our office. Have the discharge nurse carefully go over each prescription with you, before taking them home. Make sure that your name is accurately spelled, that your address is correct. Check the name and dose of your medication to make sure it is accurate. Check the number of pills, and the written instructions to make sure they are clear and accurate. Make sure that you are given enough medication to last until your next medication refill appointment. 5. Taking Medication: Take medication as prescribed. When it comes to controlled substances, taking less pills or less frequently than prescribed is permitted and encouraged. Never take more pills than instructed. Never take medication more frequently than prescribed.  6. Inform other Doctors: Always inform, all of your healthcare providers, of all the medications you take. 7. Pain Medication from other Providers: You are not allowed to accept any additional pain medication from any other Doctor or Healthcare provider. There are two exceptions to this rule. (see below) In the event that you require additional pain medication, you are responsible for notifying us, as stated below. 8. Medication Agreement: You are responsible for carefully reading and following our Medication Agreement. This must be signed before receiving any prescriptions from our practice. Safely store a copy of your signed Agreement. Violations to the Agreement will result in no further prescriptions. (Additional copies of our Medication Agreement are available upon request.) 9. Laws, Rules,  & Regulations: All patients are expected to follow all Federal   and State Laws, Statutes, Rules, & Regulations. Ignorance of the Laws does not constitute a valid excuse. The use of any illegal substances is prohibited. 10. Adopted CDC guidelines & recommendations: Target dosing levels will be at or below 60 MME/day. Use of benzodiazepines** is not recommended.  Exceptions: There are only two exceptions to the rule of not receiving pain medications from other Healthcare Providers. 1. Exception #1 (Emergencies): In the event of an emergency (i.e.: accident requiring emergency care), you are allowed to receive additional pain medication. However, you are responsible for: As soon as you are able, call our office [phone], at any time of the day or night, and leave a message stating your name, the date and nature of the emergency, and the name and dose of the medication prescribed. In the event that your call is answered by a member of our staff, make sure to document and save the date, time, and the name of the person that took your information.  2. Exception #2 (Planned Surgery): In the event that you are scheduled by another doctor or dentist to have any type of surgery or procedure, you are allowed (for a period no longer than 30 days), to receive additional pain medication, for the acute post-op pain. However, in this case, you are responsible for picking up a copy of our "Post-op Pain Management for Surgeons" handout, and giving it to your surgeon or dentist. This document is available at our office, and does not require an appointment to obtain it. Simply go to our office during business hours (Monday-Thursday from 8:00 AM to 4:00 PM) (Friday 8:00 AM to 12:00 Noon) or if you have a scheduled appointment with us, prior to your surgery, and ask for it by name. In addition, you will need to provide us with your name, name of your surgeon, type of surgery, and date of procedure or surgery.  *Opioid  medications include: morphine, codeine, oxycodone, oxymorphone, hydrocodone, hydromorphone, meperidine, tramadol, tapentadol, buprenorphine, fentanyl, methadone. **Benzodiazepine medications include: diazepam (Valium), alprazolam (Xanax), clonazepam (Klonopine), lorazepam (Ativan), clorazepate (Tranxene), chlordiazepoxide (Librium), estazolam (Prosom), oxazepam (Serax), temazepam (Restoril), triazolam (Halcion) (Last updated: 01/04/2018) ____________________________________________________________________________________________   ____________________________________________________________________________________________  Medication Recommendations and Reminders  Applies to: All patients receiving prescriptions (written and/or electronic).  Medication Rules & Regulations: These rules and regulations exist for your safety and that of others. They are not flexible and neither are we. Dismissing or ignoring them will be considered "non-compliance" with medication therapy, resulting in complete and irreversible termination of such therapy. (See document titled "Medication Rules" for more details.) In all conscience, because of safety reasons, we cannot continue providing a therapy where the patient does not follow instructions.  Pharmacy of record:   Definition: This is the pharmacy where your electronic prescriptions will be sent.   We do not endorse any particular pharmacy.  You are not restricted in your choice of pharmacy.  The pharmacy listed in the electronic medical record should be the one where you want electronic prescriptions to be sent.  If you choose to change pharmacy, simply notify our nursing staff of your choice of new pharmacy.  Recommendations:  Keep all of your pain medications in a safe place, under lock and key, even if you live alone.   After you fill your prescription, take 1 week's worth of pills and put them away in a safe place. You should keep a separate,  properly labeled bottle for this purpose. The remainder should be kept in the original bottle. Use   this as your primary supply, until it runs out. Once it's gone, then you know that you have 1 week's worth of medicine, and it is time to come in for a prescription refill. If you do this correctly, it is unlikely that you will ever run out of medicine.  To make sure that the above recommendation works, it is very important that you make sure your medication refill appointments are scheduled at least 1 week before you run out of medicine. To do this in an effective manner, make sure that you do not leave the office without scheduling your next medication management appointment. Always ask the nursing staff to show you in your prescription , when your medication will be running out. Then arrange for the receptionist to get you a return appointment, at least 7 days before you run out of medicine. Do not wait until you have 1 or 2 pills left, to come in. This is very poor planning and does not take into consideration that we may need to cancel appointments due to bad weather, sickness, or emergencies affecting our staff.  "Partial Fill": If for any reason your pharmacy does not have enough pills/tablets to completely fill or refill your prescription, do not allow for a "partial fill". You will need a separate prescription to fill the remaining amount, which we will not provide. If the reason for the partial fill is your insurance, you will need to talk to the pharmacist about payment alternatives for the remaining tablets, but again, do not accept a partial fill.  Prescription refills and/or changes in medication(s):   Prescription refills, and/or changes in dose or medication, will be conducted only during scheduled medication management appointments. (Applies to both, written and electronic prescriptions.)  No refills on procedure days. No medication will be changed or started on procedure days. No changes,  adjustments, and/or refills will be conducted on a procedure day. Doing so will interfere with the diagnostic portion of the procedure.  No phone refills. No medications will be "called into the pharmacy".  No Fax refills.  No weekend refills.  No Holliday refills.  No after hours refills.  Remember:  Business hours are:  Monday to Thursday 8:00 AM to 4:00 PM Provider's Schedule: Crystal King, NP - Appointments are:  Medication management: Monday to Thursday 8:00 AM to 4:00 PM Keeana Pieratt, MD - Appointments are:  Medication management: Monday and Wednesday 8:00 AM to 4:00 PM Procedure day: Tuesday and Thursday 7:30 AM to 4:00 PM Bilal Lateef, MD - Appointments are:  Medication management: Tuesday and Thursday 8:00 AM to 4:00 PM Procedure day: Monday and Wednesday 7:30 AM to 4:00 PM (Last update: 01/04/2018) ____________________________________________________________________________________________   ____________________________________________________________________________________________  CANNABIDIOL (AKA: CBD Oil or Pills)  Applies to: All patients receiving prescriptions of controlled substances (written and/or electronic).  General Information: Cannabidiol (CBD) was discovered in 1940. It is one of some 113 identified cannabinoids in cannabis (Marijuana) plants, accounting for up to 40% of the plant's extract. As of 2018, preliminary clinical research on cannabidiol included studies of anxiety, cognition, movement disorders, and pain.  Cannabidiol is consummed in multiple ways, including inhalation of cannabis smoke or vapor, as an aerosol spray into the cheek, and by mouth. It may be supplied as CBD oil containing CBD as the active ingredient (no added tetrahydrocannabinol (THC) or terpenes), a full-plant CBD-dominant hemp extract oil, capsules, dried cannabis, or as a liquid solution. CBD is thought not have the same psychoactivity as THC, and may affect the   actions  of THC. Studies suggest that CBD may interact with different biological targets, including cannabinoid receptors and other neurotransmitter receptors. As of 2018 the mechanism of action for its biological effects has not been determined.  In the [place], cannabidiol has a limited approval by the Food and Drug Administration (FDA) for treatment of only two types of epilepsy disorders. The side effects of long-term use of the drug include somnolence, decreased appetite, diarrhea, fatigue, malaise, weakness, sleeping problems, and others.  CBD remains a Schedule I drug prohibited for any use.  Legality: Some manufacturers ship CBD products nationally, an illegal action which the FDA has not enforced in 2018, with CBD remaining the subject of an FDA investigational new drug evaluation, and is not considered legal as a dietary supplement or food ingredient as of December 2018. Federal illegality has made it difficult historically to conduct research on CBD. CBD is openly sold in head shops and health food stores in some states where such sales have not been explicitly legalized.  Warning: Because it is not FDA approved for general use or treatment of pain, it is not required to undergo the same manufacturing controls as prescription drugs.  This means that the available cannabidiol (CBD) may be contaminated with THC.  If this is the case, it will trigger a positive urine drug screen (UDS) test for cannabinoids (Marijuana).  Because a positive UDS for illicit substances is a violation of our medication agreement, your opioid analgesics (pain medicine) may be permanently discontinued. (Last update: 01/25/2018) ____________________________________________________________________________________________    

## 2019-02-25 DIAGNOSIS — Z299 Encounter for prophylactic measures, unspecified: Secondary | ICD-10-CM | POA: Diagnosis not present

## 2019-02-25 DIAGNOSIS — Z6834 Body mass index (BMI) 34.0-34.9, adult: Secondary | ICD-10-CM | POA: Diagnosis not present

## 2019-02-25 DIAGNOSIS — G8929 Other chronic pain: Secondary | ICD-10-CM | POA: Diagnosis not present

## 2019-02-25 DIAGNOSIS — F419 Anxiety disorder, unspecified: Secondary | ICD-10-CM | POA: Diagnosis not present

## 2019-02-25 DIAGNOSIS — I10 Essential (primary) hypertension: Secondary | ICD-10-CM | POA: Diagnosis not present

## 2019-03-07 ENCOUNTER — Telehealth: Payer: Self-pay | Admitting: Pain Medicine

## 2019-03-07 NOTE — Telephone Encounter (Signed)
Pt called stating that she is having increased pain and wants to know what she should do. Pt had a phone visit with Dr Dorris Carnes 4/15 for the same reason.

## 2019-03-07 NOTE — Telephone Encounter (Signed)
Voicemail left with patient that we are not currently seeing patient's in the office.  She could use comfort measures such as ice or heat.  If the pain is so severe and she feels that she needs to be seen she might consider urgent care or the ED. Instructed to please call back if there are further questions or concerns.

## 2019-03-14 ENCOUNTER — Telehealth: Payer: Self-pay

## 2019-03-14 NOTE — Telephone Encounter (Signed)
She called back to give [place] the name of her doctor doing her knee surgery Dr. Cliffton Asters  Fax # [phone]

## 2019-03-14 NOTE — Telephone Encounter (Signed)
Spoke with patient and notified her to get the surgeon fax number  And to call us with it and we would fax the surgeon letter to him.

## 2019-03-14 NOTE — Telephone Encounter (Addendum)
She wanted to let us know that she is having knee surgery on 04/18/19. She will let us know what pain meds he gives her. I told her that is what usually happens and that Dr. Laban Emperor would probably not write her any extra.  She said Dr. Laban Emperor wanted to do knee injections before her surgery but we are not doing them right now. She wants Dr. Laban Emperor to call her to discuss all of this.

## 2019-03-23 IMAGING — CR DG THORACIC SPINE 2V
1 series · 3 of 3 positions shown · non-contrast
Comparison: 04/27/2015

CLINICAL DATA: Chronic upper back pain.  No reported injury.

EXAM:
THORACIC SPINE 2 VIEWS

[Series 1: dg thoracic spine 2 view · 0.14mm/px · 3 of 3 slices shown]
[im 1/3]
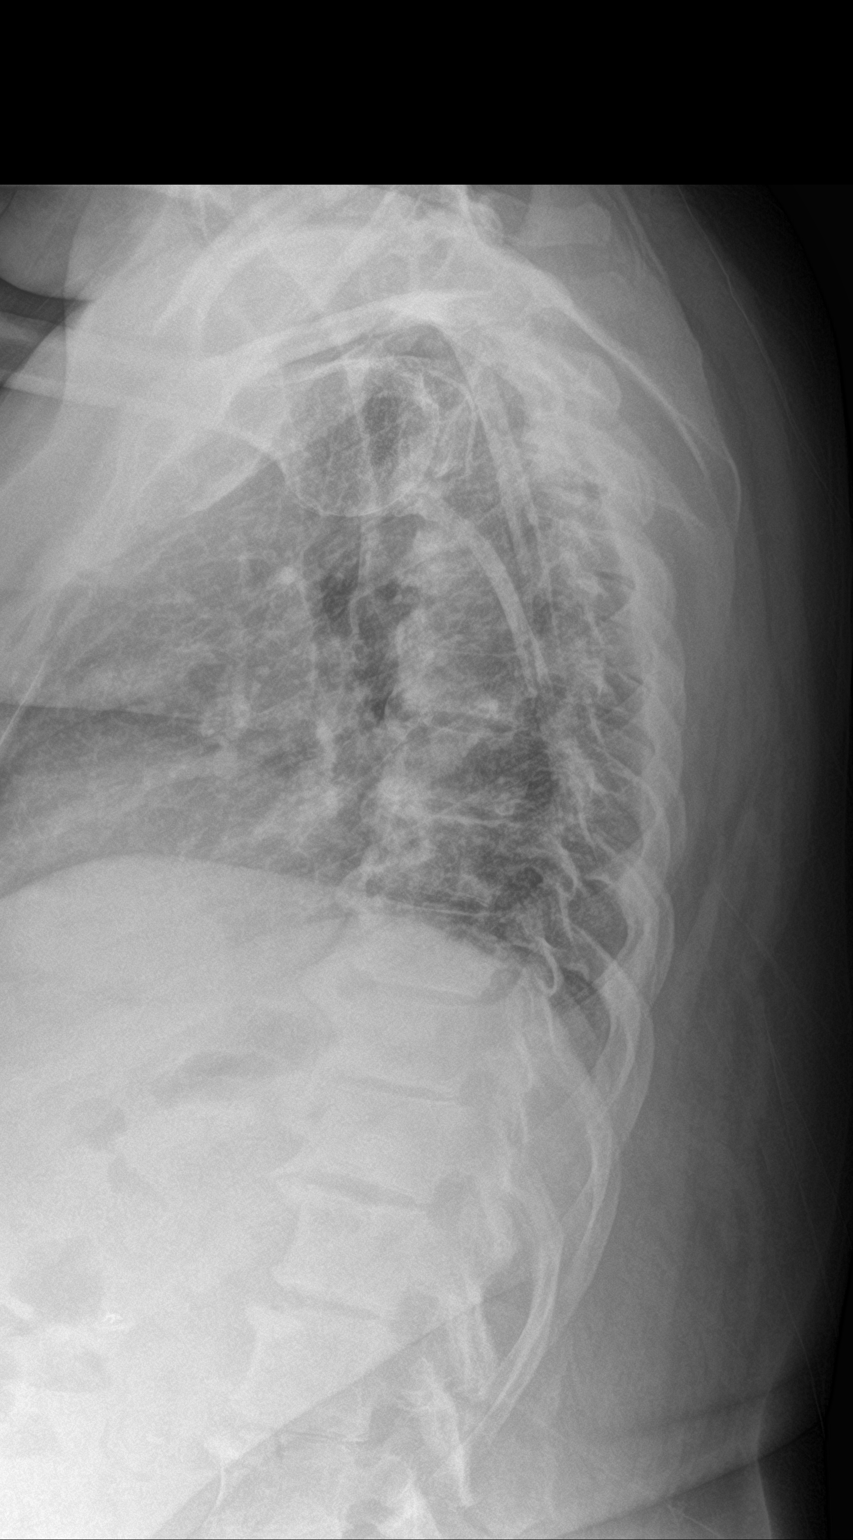
[im 2/3]
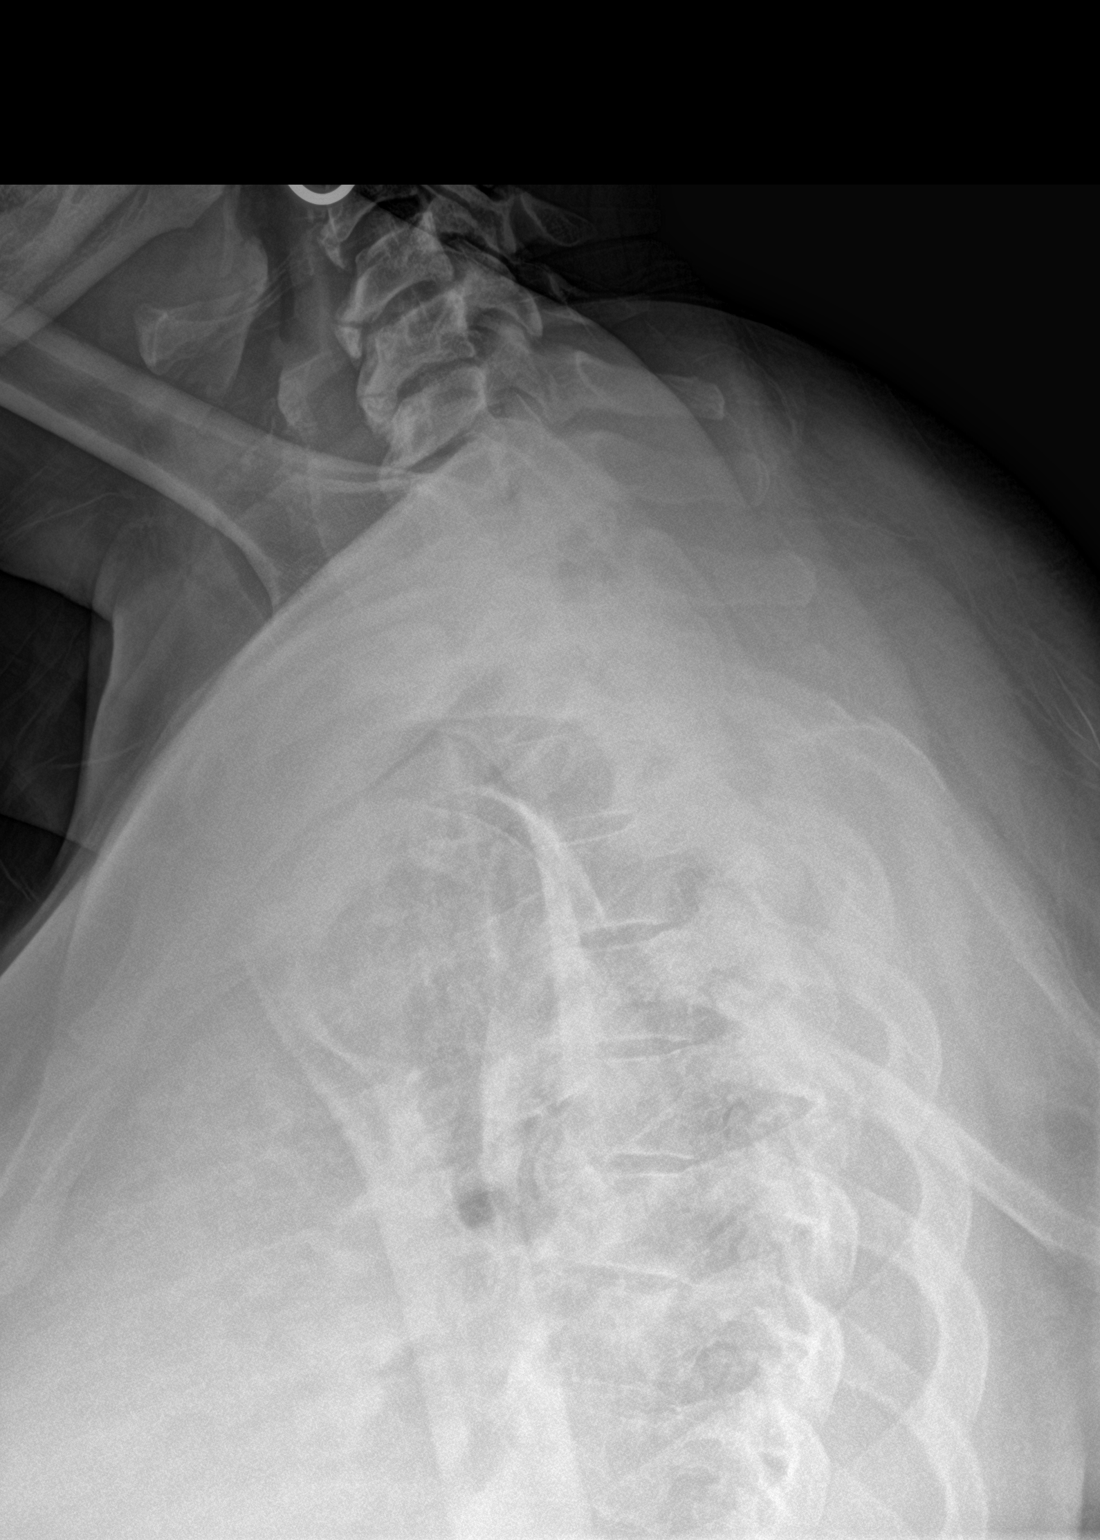
[im 3/3]
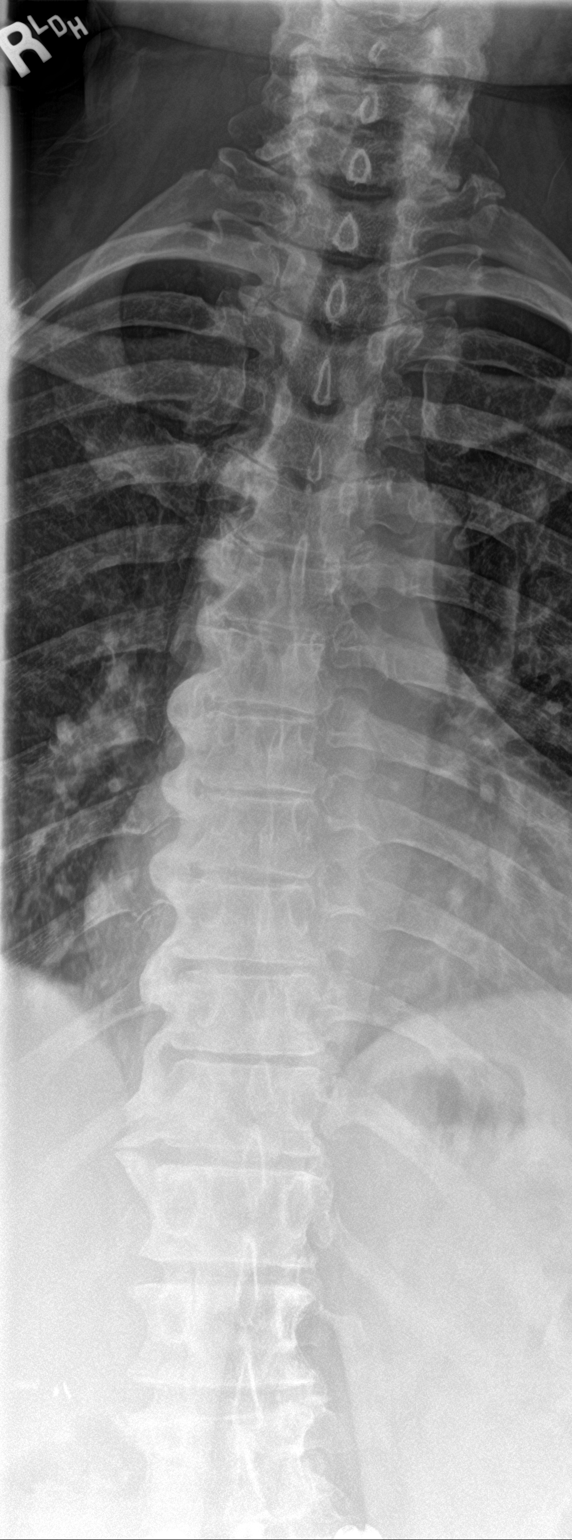

[3 of 3 positions shown; findings below may reference images not displayed]

FINDINGS: Flowing ventral and right lateral osteophytes in the thoracic spine
with multi-level ankylosis. No fracture deformity or visible
erosions/lesion. Maintained posterior mediastinal fat planes.
IMPRESSION: 1. No acute finding.
2. Diffuse idiopathic skeletal hyperostosis with multilevel
ankylosis.

## 2019-03-25 ENCOUNTER — Encounter: Payer: Self-pay | Admitting: Nurse Practitioner

## 2019-03-25 DIAGNOSIS — Z96651 Presence of right artificial knee joint: Secondary | ICD-10-CM | POA: Diagnosis not present

## 2019-03-25 DIAGNOSIS — T8484XD Pain due to internal orthopedic prosthetic devices, implants and grafts, subsequent encounter: Secondary | ICD-10-CM | POA: Diagnosis not present

## 2019-04-04 ENCOUNTER — Telehealth: Payer: Self-pay

## 2019-04-04 NOTE — Telephone Encounter (Signed)
She is having knee surgery on 04/16/19 and is till having spasms in her back. She wanted me to send a note to Dr. Laban Emperor asking what she is supposed to do about the spasms since she cant come for a procedure due to the knee replacement surgery.......[name]

## 2019-04-04 NOTE — Telephone Encounter (Signed)
Spoke with patient, she is having right knee replacement on April 16, 2019 with Dr Glenice Laine at [facility].  She was wanting to get in for procedure with Dr Laban Emperor prior to surgery.  She also has an MRI scheduled for 04/12/19 of her cervical spine.  I explained that I felt it would be prudent to have MRI performed and then possibly schedule an appt VV with Dr Laban Emperor on the 8th to go over results.  Patient feels that there is a lot going on and doesn't know if she can swing all of this right now.  She did ask about pain control after surgery and wondered if Dr Laban Emperor would be prescribing her medication.  I offered to leave her a Surgeons letter downstairs at desk to pick up at time of her MRI that would explain the expectations of post surgical pain relief when patient is in a chronic pain management contract.  Patient is agreeable to retrieve this on 04/12/19 @ 0830 when MRI is scheduled and then will call when she is able to schedule an appt with Dr Laban Emperor.  She does have an appt for med mgmt on 05/20/19

## 2019-04-10 DIAGNOSIS — Z01818 Encounter for other preprocedural examination: Secondary | ICD-10-CM | POA: Diagnosis not present

## 2019-04-10 DIAGNOSIS — Z01812 Encounter for preprocedural laboratory examination: Secondary | ICD-10-CM | POA: Diagnosis not present

## 2019-04-10 DIAGNOSIS — T8484XA Pain due to internal orthopedic prosthetic devices, implants and grafts, initial encounter: Secondary | ICD-10-CM | POA: Diagnosis not present

## 2019-04-10 DIAGNOSIS — Z1159 Encounter for screening for other viral diseases: Secondary | ICD-10-CM | POA: Diagnosis not present

## 2019-04-12 ENCOUNTER — Ambulatory Visit
Admission: RE | Admit: 2019-04-12 | Discharge: 2019-04-12 | Disposition: A | Discharge status: Home / Self Care | Payer: PPO | Source: Ambulatory Visit | Attending: Pain Medicine | Admitting: Pain Medicine

## 2019-04-12 ENCOUNTER — Other Ambulatory Visit: Payer: Self-pay

## 2019-04-12 DIAGNOSIS — M542 Cervicalgia: Secondary | ICD-10-CM | POA: Diagnosis not present

## 2019-04-12 DIAGNOSIS — M50323 Other cervical disc degeneration at C6-C7 level: Secondary | ICD-10-CM | POA: Diagnosis not present

## 2019-04-12 DIAGNOSIS — G8929 Other chronic pain: Secondary | ICD-10-CM | POA: Insufficient documentation

## 2019-04-12 DIAGNOSIS — M79602 Pain in left arm: Secondary | ICD-10-CM | POA: Diagnosis not present

## 2019-04-12 DIAGNOSIS — M50223 Other cervical disc displacement at C6-C7 level: Secondary | ICD-10-CM | POA: Diagnosis not present

## 2019-04-12 DIAGNOSIS — M79601 Pain in right arm: Secondary | ICD-10-CM | POA: Diagnosis not present

## 2019-04-12 DIAGNOSIS — M4722 Other spondylosis with radiculopathy, cervical region: Secondary | ICD-10-CM | POA: Diagnosis not present

## 2019-04-12 DIAGNOSIS — M5030 Other cervical disc degeneration, unspecified cervical region: Secondary | ICD-10-CM

## 2019-04-12 DIAGNOSIS — M47812 Spondylosis without myelopathy or radiculopathy, cervical region: Secondary | ICD-10-CM | POA: Diagnosis not present

## 2019-04-16 DIAGNOSIS — E785 Hyperlipidemia, unspecified: Secondary | ICD-10-CM | POA: Diagnosis not present

## 2019-04-16 DIAGNOSIS — G4733 Obstructive sleep apnea (adult) (pediatric): Secondary | ICD-10-CM | POA: Diagnosis not present

## 2019-04-16 DIAGNOSIS — T8484XD Pain due to internal orthopedic prosthetic devices, implants and grafts, subsequent encounter: Secondary | ICD-10-CM | POA: Diagnosis not present

## 2019-04-16 DIAGNOSIS — T84032A Mechanical loosening of internal right knee prosthetic joint, initial encounter: Secondary | ICD-10-CM | POA: Diagnosis not present

## 2019-04-16 DIAGNOSIS — Z8261 Family history of arthritis: Secondary | ICD-10-CM | POA: Diagnosis not present

## 2019-04-16 DIAGNOSIS — M797 Fibromyalgia: Secondary | ICD-10-CM | POA: Diagnosis not present

## 2019-04-16 DIAGNOSIS — E119 Type 2 diabetes mellitus without complications: Secondary | ICD-10-CM | POA: Diagnosis not present

## 2019-04-16 DIAGNOSIS — G8929 Other chronic pain: Secondary | ICD-10-CM | POA: Diagnosis not present

## 2019-04-16 DIAGNOSIS — Z9049 Acquired absence of other specified parts of digestive tract: Secondary | ICD-10-CM | POA: Diagnosis not present

## 2019-04-16 DIAGNOSIS — F1190 Opioid use, unspecified, uncomplicated: Secondary | ICD-10-CM | POA: Diagnosis not present

## 2019-04-16 DIAGNOSIS — Z96651 Presence of right artificial knee joint: Secondary | ICD-10-CM | POA: Diagnosis not present

## 2019-04-16 DIAGNOSIS — Z6839 Body mass index (BMI) 39.0-39.9, adult: Secondary | ICD-10-CM | POA: Diagnosis not present

## 2019-04-16 DIAGNOSIS — Z87891 Personal history of nicotine dependence: Secondary | ICD-10-CM | POA: Diagnosis not present

## 2019-04-16 DIAGNOSIS — T8484XA Pain due to internal orthopedic prosthetic devices, implants and grafts, initial encounter: Secondary | ICD-10-CM | POA: Diagnosis not present

## 2019-04-16 DIAGNOSIS — M069 Rheumatoid arthritis, unspecified: Secondary | ICD-10-CM | POA: Diagnosis not present

## 2019-04-16 DIAGNOSIS — F419 Anxiety disorder, unspecified: Secondary | ICD-10-CM | POA: Diagnosis not present

## 2019-04-16 DIAGNOSIS — G8918 Other acute postprocedural pain: Secondary | ICD-10-CM | POA: Diagnosis not present

## 2019-04-16 DIAGNOSIS — Z8673 Personal history of transient ischemic attack (TIA), and cerebral infarction without residual deficits: Secondary | ICD-10-CM | POA: Diagnosis not present

## 2019-04-16 DIAGNOSIS — E669 Obesity, unspecified: Secondary | ICD-10-CM | POA: Diagnosis not present

## 2019-04-16 DIAGNOSIS — Z79891 Long term (current) use of opiate analgesic: Secondary | ICD-10-CM | POA: Diagnosis not present

## 2019-04-16 DIAGNOSIS — K219 Gastro-esophageal reflux disease without esophagitis: Secondary | ICD-10-CM | POA: Diagnosis not present

## 2019-04-16 HISTORY — PX: JOINT REPLACEMENT: SHX530

## 2019-04-18 DIAGNOSIS — E785 Hyperlipidemia, unspecified: Secondary | ICD-10-CM | POA: Insufficient documentation

## 2019-04-18 DIAGNOSIS — Z8673 Personal history of transient ischemic attack (TIA), and cerebral infarction without residual deficits: Secondary | ICD-10-CM | POA: Insufficient documentation

## 2019-04-18 DIAGNOSIS — F419 Anxiety disorder, unspecified: Secondary | ICD-10-CM | POA: Insufficient documentation

## 2019-04-22 DIAGNOSIS — R7303 Prediabetes: Secondary | ICD-10-CM | POA: Diagnosis not present

## 2019-04-22 DIAGNOSIS — M4800 Spinal stenosis, site unspecified: Secondary | ICD-10-CM | POA: Diagnosis not present

## 2019-04-22 DIAGNOSIS — K219 Gastro-esophageal reflux disease without esophagitis: Secondary | ICD-10-CM | POA: Diagnosis not present

## 2019-04-22 DIAGNOSIS — T84032A Mechanical loosening of internal right knee prosthetic joint, initial encounter: Secondary | ICD-10-CM | POA: Diagnosis not present

## 2019-04-22 DIAGNOSIS — M069 Rheumatoid arthritis, unspecified: Secondary | ICD-10-CM | POA: Diagnosis not present

## 2019-04-22 DIAGNOSIS — M797 Fibromyalgia: Secondary | ICD-10-CM | POA: Diagnosis not present

## 2019-04-22 DIAGNOSIS — E785 Hyperlipidemia, unspecified: Secondary | ICD-10-CM | POA: Diagnosis not present

## 2019-04-22 DIAGNOSIS — Z87891 Personal history of nicotine dependence: Secondary | ICD-10-CM | POA: Diagnosis not present

## 2019-04-22 DIAGNOSIS — Z8673 Personal history of transient ischemic attack (TIA), and cerebral infarction without residual deficits: Secondary | ICD-10-CM | POA: Diagnosis not present

## 2019-04-22 DIAGNOSIS — F419 Anxiety disorder, unspecified: Secondary | ICD-10-CM | POA: Diagnosis not present

## 2019-04-29 DIAGNOSIS — I10 Essential (primary) hypertension: Secondary | ICD-10-CM | POA: Diagnosis not present

## 2019-04-29 DIAGNOSIS — Z96651 Presence of right artificial knee joint: Secondary | ICD-10-CM | POA: Diagnosis not present

## 2019-04-29 DIAGNOSIS — Z299 Encounter for prophylactic measures, unspecified: Secondary | ICD-10-CM | POA: Diagnosis not present

## 2019-04-29 DIAGNOSIS — Z09 Encounter for follow-up examination after completed treatment for conditions other than malignant neoplasm: Secondary | ICD-10-CM | POA: Diagnosis not present

## 2019-04-29 DIAGNOSIS — Z6834 Body mass index (BMI) 34.0-34.9, adult: Secondary | ICD-10-CM | POA: Diagnosis not present

## 2019-04-30 DIAGNOSIS — Z96651 Presence of right artificial knee joint: Secondary | ICD-10-CM | POA: Diagnosis not present

## 2019-04-30 DIAGNOSIS — Z471 Aftercare following joint replacement surgery: Secondary | ICD-10-CM | POA: Diagnosis not present

## 2019-05-13 IMAGING — CR DG HIP (WITH OR WITHOUT PELVIS) 2-3V*R*
1 series · 3 of 3 positions shown · non-contrast
Comparison: Radiographs 09/06/2017

CLINICAL DATA: Chronic right hip pain

EXAM:
DG HIP (WITH OR WITHOUT PELVIS) 2-3V RIGHT

[Series 1: dg hip unilat w or w/o pelvis 2-3 views  · non-contrast · 0.14mm/px · 3 of 3 slices shown]
[im 1/3]
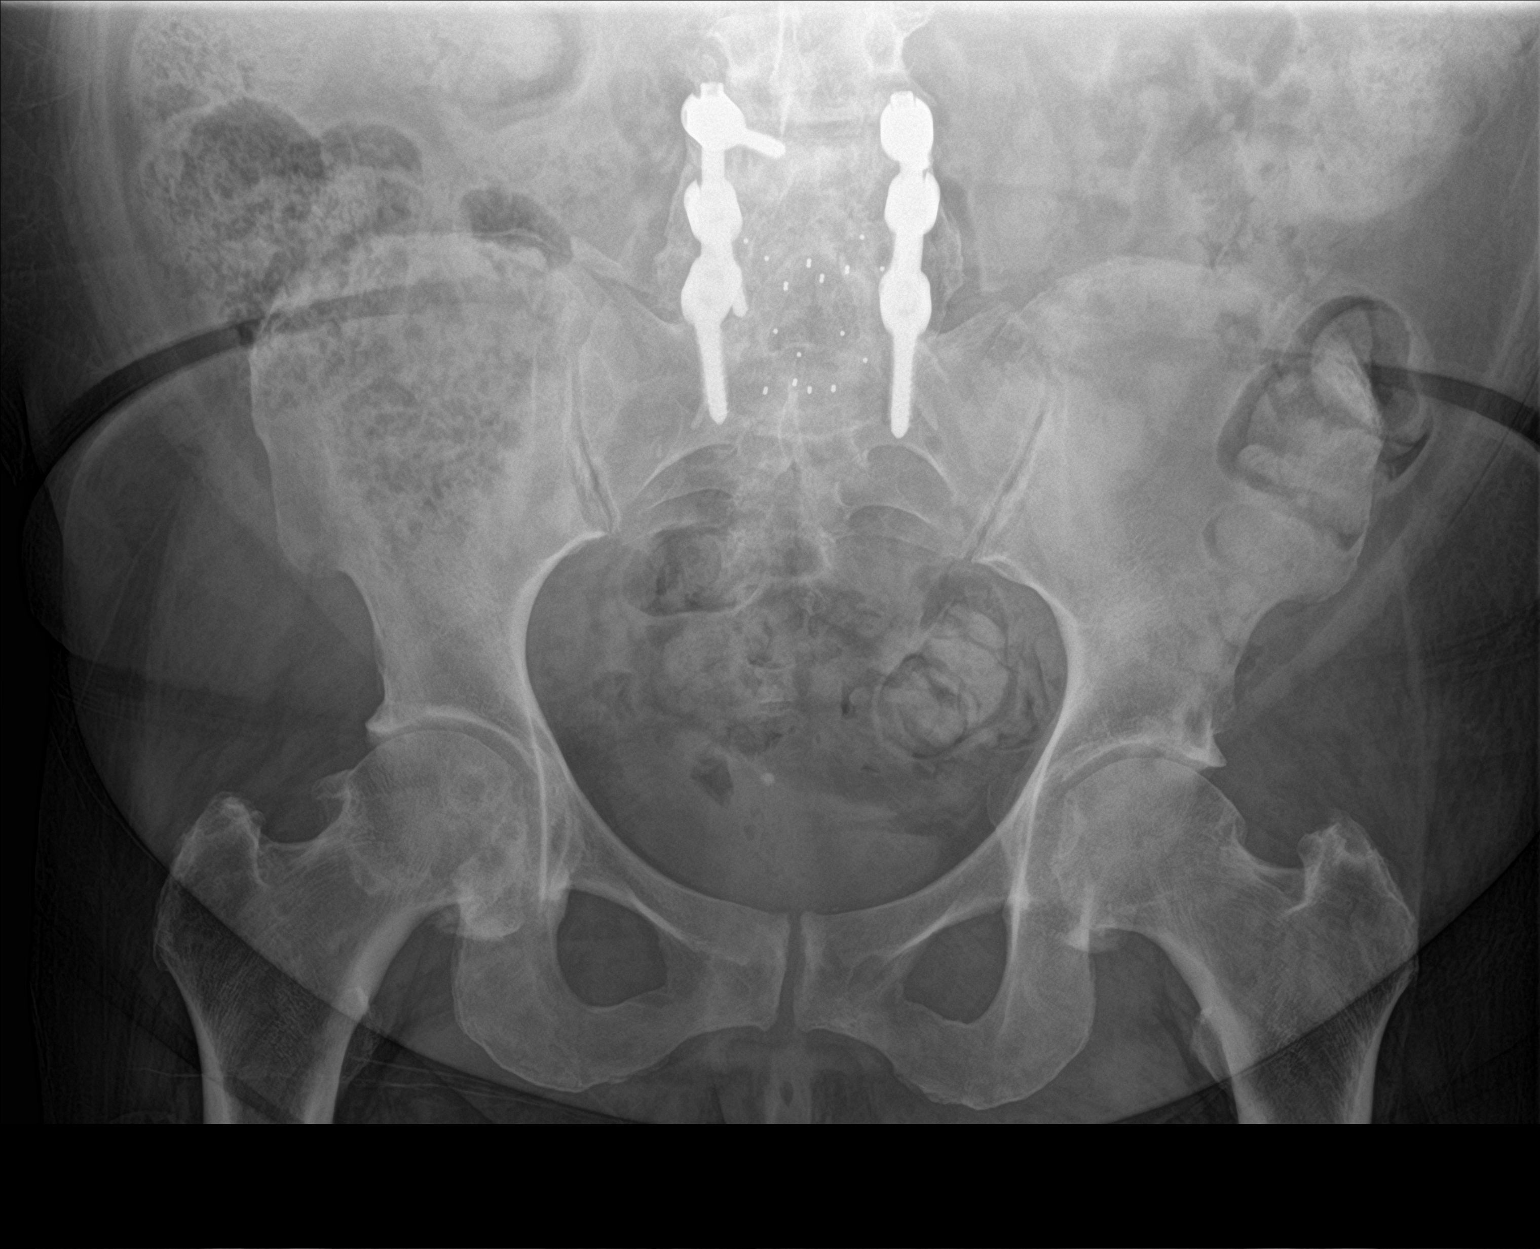
[im 2/3]
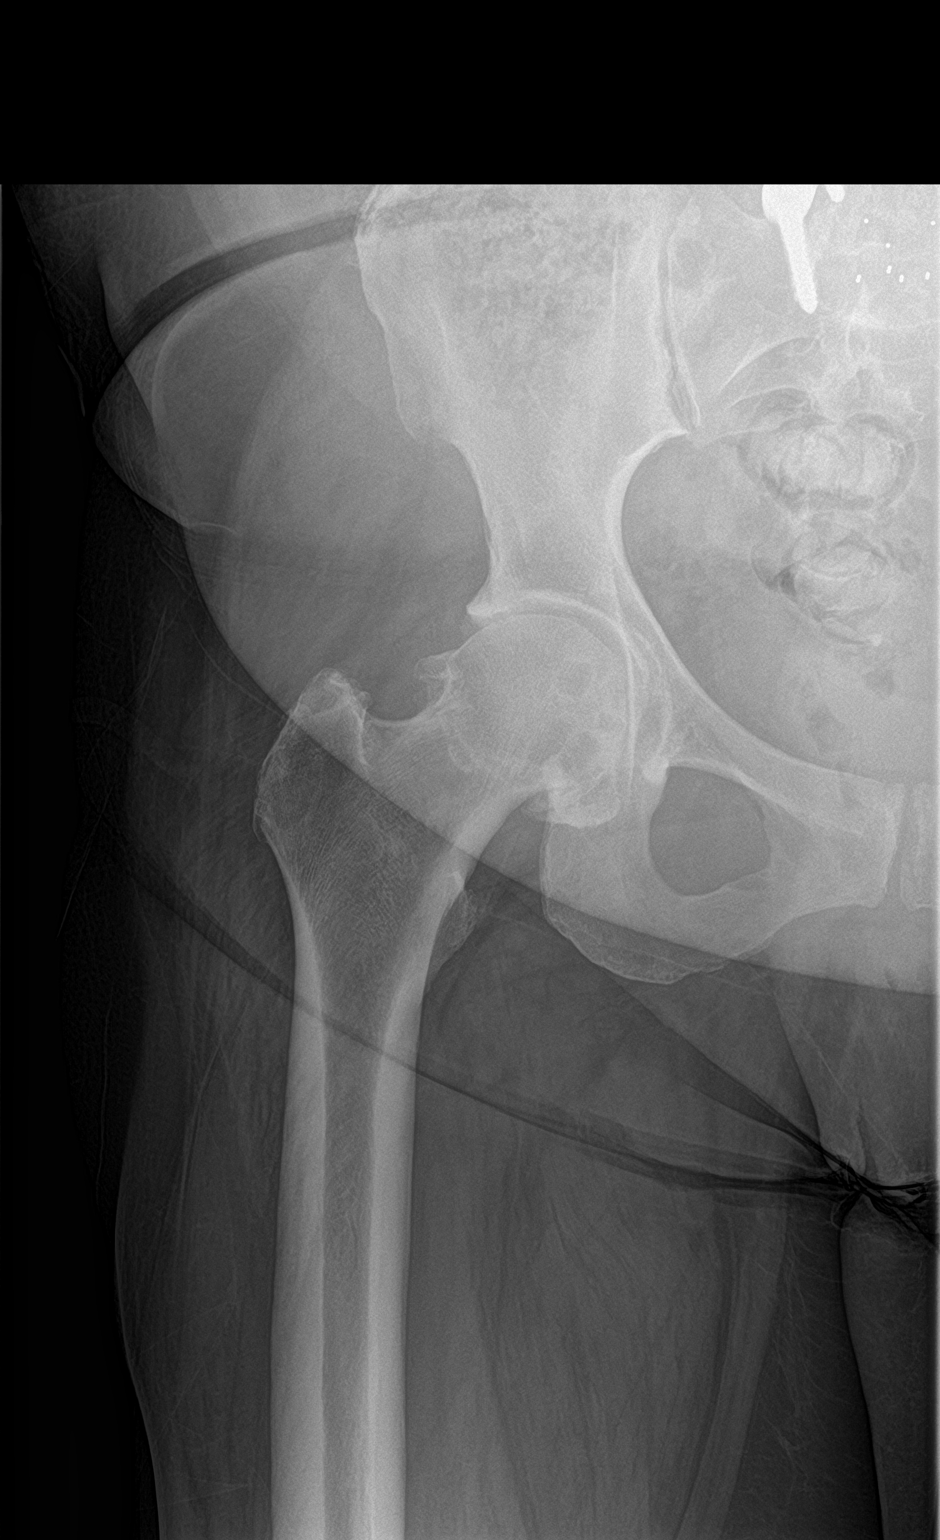
[im 3/3]
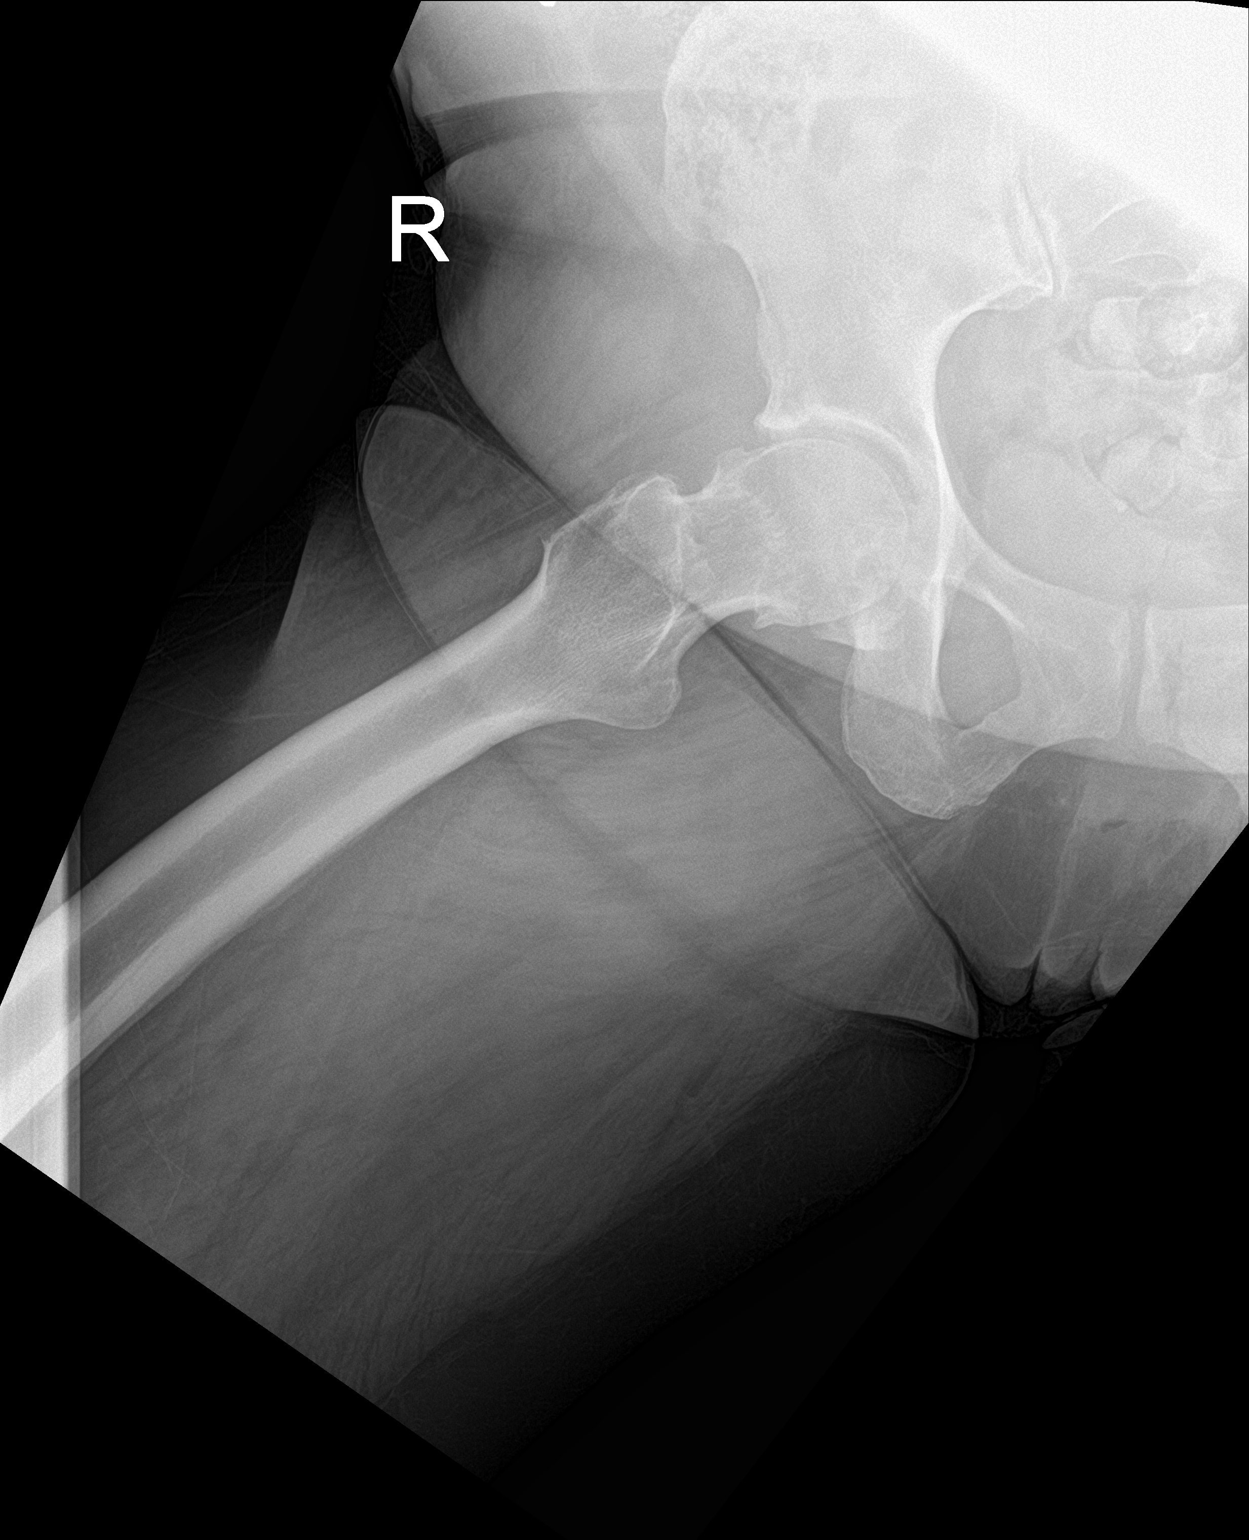

[3 of 3 positions shown; findings below may reference images not displayed]

FINDINGS: Minimal SI joint sclerosis with patency. Postsurgical changes in the
lumbosacral spine. Pubic symphysis and rami are intact. No acute
fracture or malalignment. Prominent osteophyte color at the right
femoral head neck junction. Mild to moderate arthritis of the right
greater the left hip. Degenerative cysts in the right humeral head.
IMPRESSION: 1. No acute osseous abnormality
2. Mild to moderate arthritis of the right greater than left hips.

## 2019-05-16 ENCOUNTER — Encounter: Payer: Self-pay | Admitting: Pain Medicine

## 2019-05-16 DIAGNOSIS — Z96651 Presence of right artificial knee joint: Secondary | ICD-10-CM | POA: Diagnosis not present

## 2019-05-16 DIAGNOSIS — Z471 Aftercare following joint replacement surgery: Secondary | ICD-10-CM | POA: Diagnosis not present

## 2019-05-16 DIAGNOSIS — M25561 Pain in right knee: Secondary | ICD-10-CM | POA: Diagnosis not present

## 2019-05-16 NOTE — Progress Notes (Signed)
Pain Management Virtual Encounter Note - Virtual Visit via Telephone Telehealth (real-time audio visits between healthcare provider and patient).   Patient's Phone No. & Preferred Pharmacy:  [phone] (home); [phone] (mobile); (Preferred) [phone] No e-mail address on record  [facility] - [place], [place] - [address] [place] [address] Phone: [phone] Fax: [phone]    Pre-screening note:  Our staff contacted Jennifer Harmon and offered her an "in person", "face-to-face" appointment versus a telephone encounter. She indicated preferring the telephone encounter, at this time.   Reason for Virtual Visit: COVID-19*  Social distancing based on CDC and AMA recommendations.   I contacted Jennifer Harmon on 05/20/2019 via telephone.      I clearly identified myself as Oswaldo Done, MD. I verified that I was speaking with the correct person using two identifiers (Name: Jennifer Harmon, and date of birth: Aug 13, 1961).  Advanced Informed Consent I sought verbal advanced consent from Jennifer Harmon for virtual visit interactions. I informed Jennifer Harmon of possible security and privacy concerns, risks, and limitations associated with providing "not-in-person" medical evaluation and management services. I also informed Jennifer Harmon of the availability of "in-person" appointments. Finally, I informed her that there would be a charge for the virtual visit and that she could be  personally, fully or partially, financially responsible for it. Jennifer Harmon expressed understanding and agreed to proceed.   Historic Elements   Jennifer Harmon is a 58 y.o. year old, female patient evaluated today after her last encounter by our practice on 04/04/2019. Jennifer Harmon  has a past medical history of Acute postoperative pain (05/03/2018), Anxiety, Chronic chest pain, Depression, DJD (degenerative joint disease), Fibromyalgia, GERD (gastroesophageal reflux  disease), and Hypercholesterolemia. She also  has a past surgical history that includes Cholecystectomy; Cesarean section; and Back surgery (2008). Jennifer Harmon has a current medication list which includes the following prescription(s): clonazepam, latanoprost, multivitamin with minerals, rosuvastatin, baclofen, cyclobenzaprine, esomeprazole, magnesium oxide, oxycodone, oxycodone, oxycodone, and pregabalin. She  reports that she quit smoking about 3 years ago. Her smoking use included pipe. She has a 10.00 pack-year smoking history. She uses smokeless tobacco. She reports that she does not drink alcohol or use drugs. Jennifer Harmon is allergic to asa [aspirin]; bupropion; and tape.   HPI  Today, she is being contacted for medication management.  The patient indicates doing well without any side effects or adverse reactions to any of her medications.  She indicates having recently undergone a right-sided total knee replacement revision on 04/16/2019.  She has been experiencing more pain in the lower back with the right side being worse than the left.  Previously we had done a bilateral lumbar facet radiofrequency ablation with excellent results.  She indicates that her pain is not quite as bad as it used to be, but is beginning to come back.  The radiofrequency ablation on the right side was done on 05/03/2018 and on the left side it was done on 08/16/2018.  He has been more than a year on the right side, where she indicates that she is currently experiencing more pain.  This is likely to be secondary to the fact that the radiofrequency ablation may be wearing off.  I will be scheduling the patient to return for a diagnostic bilateral lumbar facet block under fluoroscopic guidance and IV sedation to see if by any chance the radiofrequency is wearing off.  If the patient gets good relief of  the pain, this would again suggest that it is.  Pharmacotherapy Assessment  Analgesic: Oxycodone IR 5 mg, 1 tab PO q 8 hrs (15  mg/day of oxycodone) MME/day: 22.5 mg/day.   Monitoring: Pharmacotherapy: No side-effects or adverse reactions reported. [place] PMP: PDMP reviewed during this encounter.       Compliance: No problems identified. Effectiveness: Clinically acceptable. Plan: Refer to "POC".  Pertinent Labs   SAFETY SCREENING Profile No results found for: SARSCOV2NAA, COVIDSOURCE, STAPHAUREUS, MRSAPCR, HCVAB, HIV, PREGTESTUR Renal Function Lab Results  Component Value Date   BUN 16 09/06/2017   CREATININE 0.67 09/06/2017   GFRAA >60 09/06/2017   GFRNONAA >60 09/06/2017   Hepatic Function Lab Results  Component Value Date   AST 26 09/06/2017   ALT 31 09/06/2017   ALBUMIN 4.8 09/06/2017   UDS Summary  Date Value Ref Range Status  12/24/2018 FINAL  Final    Comment:    ==================================================================== TOXASSURE SELECT 13 (MW) ==================================================================== Test                             Result       Flag       Units Drug Present and Declared for Prescription Verification   7-aminoclonazepam              315          EXPECTED   ng/mg creat    7-aminoclonazepam is an expected metabolite of clonazepam. Source    of clonazepam is a scheduled prescription medication.   Oxycodone                      221          EXPECTED   ng/mg creat   Oxymorphone                    247          EXPECTED   ng/mg creat   Noroxycodone                   573          EXPECTED   ng/mg creat   Noroxymorphone                 111          EXPECTED   ng/mg creat    Sources of oxycodone are scheduled prescription medications.    Oxymorphone, noroxycodone, and noroxymorphone are expected    metabolites of oxycodone. Oxymorphone is also available as a    scheduled prescription medication. ==================================================================== Test                      Result    Flag   Units      Ref Range   Creatinine              112               mg/dL      >=20 ==================================================================== Declared Medications:  The flagging and interpretation on this report are based on the  following declared medications.  Unexpected results may arise from  inaccuracies in the declared medications.  **Note: The testing scope of this panel includes these medications:  Clonazepam  Oxycodone  **Note: The testing scope of this panel does not include following  reported medications:  Citalopram  Cyclobenzaprine  Latanoprost  Magnesium Oxide  Multivitamin  Pregabalin  Ranitidine  Rosuvastatin ==================================================================== For clinical consultation, please call [phone]. ====================================================================    Note: Above Lab results reviewed.  Recent imaging  MR CERVICAL SPINE WO CONTRAST CLINICAL DATA:  Neck, right shoulder and right arm pain and weakness.  EXAM: MRI CERVICAL SPINE WITHOUT CONTRAST  TECHNIQUE: Multiplanar, multisequence MR imaging of the cervical spine was performed. No intravenous contrast was administered.  COMPARISON:  None.  FINDINGS: Alignment: Normal overall alignment. Mild straightening of the normal cervical lordosis.  Vertebrae: Mild degenerative endplate reactive changes but no bone lesions or fracture.  Cord: Normal MR appearance of the cervical spinal cord. No cord lesions or syrinx.  Posterior Fossa, vertebral arteries, paraspinal tissues: Moderate degenerative changes at C1-2 but no significant pannus formation.  Disc levels:  C2-3: No significant findings.  C3-4: Mild degenerative disc disease and right-sided facet disease but no disc protrusions, spinal or foraminal stenosis.  C4-5: Bulging annulus and mild osteophytic ridging with slight flattening of the ventral thecal sac and slight narrowing the ventral CSF space. There is also right-sided uncinate  spurring change contributing to mild right foraminal narrowing.  C5-6: Mild annular bulge but no spinal stenosis. Uncinate spurring changes bilaterally contributing to mild bilateral foraminal narrowing.  C6-7: Degenerative disc disease with a slight bulging annulus but no spinal stenosis. Right-sided disc osteophyte complex contributing to mild right foraminal stenosis.  C7-T1: No significant findings.  IMPRESSION: 1. Mild-to-moderate degenerative cervical spondylosis with disc disease and facet disease. 2. Mild right foraminal stenosis at C4-5 and C6-7. 3. Mild bilateral foraminal stenosis at C5-6.  Electronically Signed   By: Rudie Meyer M.D.   On: 04/12/2019 11:17  Assessment  The primary encounter diagnosis was Chronic pain syndrome. Diagnoses of Chronic upper back pain (Primary Area of Pain) (Bilateral) (R>L), Chronic low back pain  (Secondary Area of Pain) (Bilateral) (R>L), Lumbar facet syndrome (Bilateral) (R>L), Chronic neck pain (Tertiary Area of Pain) (Bilateral) (R>L), Chronic knee pain (Fourth Area of Pain) (Bilateral) (R>L), Pharmacologic therapy, Disorder of skeletal system, Problems influencing health status, Chronic musculoskeletal pain, Muscle spasms of both lower extremities, Night cramps, Fibromyalgia, Gastroesophageal reflux disease without esophagitis, NSAID induced gastritis, Neurogenic pain, and Abnormal MRI, cervical spine were also pertinent to this visit.  Plan of Care  I have discontinued Jennifer Harmon. Jennifer Harmon's oxyCODONE and oxyCODONE. I have also changed her oxyCODONE. Additionally, I am having her start on oxyCODONE and oxyCODONE. Lastly, I am having her maintain her clonazePAM, latanoprost, rosuvastatin, multivitamin with minerals, cyclobenzaprine, baclofen, Magnesium Oxide, esomeprazole, and pregabalin.  Pharmacotherapy (Medications Ordered): Meds ordered this encounter  Medications  . cyclobenzaprine (FLEXERIL) 10 MG tablet    Sig: Take 1 tablet (10  mg total) by mouth 3 (three) times daily.    Dispense:  90 tablet    Refill:  2    Fill one day early if pharmacy is closed on scheduled refill date. May substitute for generic if available.  . baclofen (LIORESAL) 10 MG tablet    Sig: Take 1-2 tablets (10-20 mg total) by mouth at bedtime.    Dispense:  60 tablet    Refill:  2    Fill one day early if pharmacy is closed on scheduled refill date. May substitute for generic if available.  . Magnesium Oxide 500 MG CAPS    Sig: Take 1 capsule (500 mg total) by mouth at bedtime.    Dispense:  30 capsule    Refill:  2    Fill one day early if pharmacy  is closed on scheduled refill date. May substitute for generic if available.  . esomeprazole (NEXIUM) 40 MG capsule    Sig: Take 1 capsule (40 mg total) by mouth at bedtime. Take while on ibuprofen.    Dispense:  30 capsule    Refill:  2    Fill one day early if pharmacy is closed on scheduled refill date. May substitute for generic if available.  . pregabalin (LYRICA) 150 MG capsule    Sig: Take 1 capsule (150 mg total) by mouth every 8 (eight) hours as needed.    Dispense:  90 capsule    Refill:  2    Fill one day early if pharmacy is closed on scheduled refill date. May substitute for generic if available.  [name]. oxyCODONE (OXY IR/ROXICODONE) 5 MG immediate release tablet    Sig: Take 1 tablet (5 mg total) by mouth every 8 (eight) hours as needed for severe pain. Must last 30 days. Max: 3/day.    Dispense:  90 tablet    Refill:  0    Chronic Pain: STOP Act (Not applicable) Fill 1 day early if closed on refill date. Do not fill until: 05/21/2019. To last until: 06/20/2019. Avoid benzodiazepines within 8 hours of opioids  . oxyCODONE (OXY IR/ROXICODONE) 5 MG immediate release tablet    Sig: Take 1 tablet (5 mg total) by mouth every 8 (eight) hours as needed for severe pain. Must last 30 days. Max: 3/day.    Dispense:  90 tablet    Refill:  0    Chronic Pain: STOP Act (Not applicable) Fill 1 day  early if closed on refill date. Do not fill until: 06/20/2019. To last until: 07/20/2019. Avoid benzodiazepines within 8 hours of opioids  . oxyCODONE (OXY IR/ROXICODONE) 5 MG immediate release tablet    Sig: Take 1 tablet (5 mg total) by mouth every 8 (eight) hours as needed for severe pain. Must last 30 days. Max: 3/day.    Dispense:  90 tablet    Refill:  0    Chronic Pain: STOP Act (Not applicable) Fill 1 day early if closed on refill date. Do not fill until: 07/20/2019. To last until: 08/19/2019. Avoid benzodiazepines within 8 hours of opioids   Orders:  Orders Placed This Encounter  Procedures  . LUMBAR FACET(MEDIAL BRANCH NERVE BLOCK) MBNB    Standing Status:   Future    Standing Expiration Date:   06/20/2019    Scheduling Instructions:     Side: Bilateral     Level: L3-4, L4-5, & L5-S1 Facets (L2, L3, L4, L5, & S1 Medial Branch Nerves)     Sedation: Patient's choice.     Timeframe: ASAA    Order Specific Question:   Where will this procedure be performed?    Answer:   [facility]  . Comp. Metabolic Panel (12)    With GFR. Indications: Chronic Pain Syndrome (G89.4) & Pharmacotherapy (Z61.096(Z79.899)    Order Specific Question:   Has the patient fasted?    Answer:   No    Order Specific Question:   CC Results    Answer:   PCP-NURSE [[id number]]  . Magnesium    Indication: Pharmacologic therapy (E45.409(Z79.899)    Order Specific Question:   CC Results    Answer:   PCP-NURSE [[id number]][[id number]]  . Vitamin B12    Indication: Pharmacologic therapy (N82.956(Z79.899).    Order Specific Question:   CC Results    Answer:   PCP-NURSE [[id number]]  . Sedimentation  rate    Indication: Disorder of skeletal system (M89.9)    Order Specific Question:   CC Results    Answer:   PCP-NURSE [[id number]][[id number]]  . 25-Hydroxyvitamin D Lcms D2+D3    Indication: Disorder of skeletal system (M89.9).    Order Specific Question:   CC Results    Answer:   PCP-NURSE [[id number]]  . C-reactive protein    Indication: Problems influencing health  status (Z78.9)    Order Specific Question:   CC Results    Answer:   PCP-NURSE [[id number]][[id number]]   Follow-up plan:   Return in about 12 weeks (around 08/12/2019) for (VV), E/M (MM), in addition, Procedure (w/ sedation): (B) L-FCT BLK, (ASAP).      Interventional management options: Considering:   Diagnostic Trigger point injections Diagnostic bilateral LESI Possiblebilateral Racz(Epidural Neurolysis) Diagnostic thoracic facet nerve block Possible thoracic facet RFA Diagnostic bilateral CESI Diagnosticbilateral cervical facet nerve block Possiblebilateral cervical facet RFA Diagnostic right shoulder intra-articular injection Diagnostic right suprascapular nerve block Possibleright suprascapular RFA Diagnostic bilateral genicular nerve block Possible bilateral genicular RFA   Palliative PRN treatment(s):   Palliative right-sided T6-7 TESI  Palliative bilateral lumbar facet blocks  Palliative right-sided sacroiliac joint blocks  Palliative right-sided intra-articular hip joint injections  Palliative right-sided lumbar facet RFA #2 (last done on 05/03/2018)  Palliative right-sided sacroiliac joint RFA #2 (last done on 05/03/2018)  Palliative left-sided lumbar facet RFA #2 (last done 08/16/2018)  Palliative right-sided genicular nerve block #2     Recent Visits Date Type Provider Dept  02/20/19 Office Visit Delano MetzNaveira, Shaquella Stamant, MD [facility]  Showing recent visits within past 90 days and meeting all other requirements   Today's Visits Date Type Provider Dept  05/20/19 Office Visit Delano MetzNaveira, Calia Napp, MD [facility]  Showing today's visits and meeting all other requirements   Future Appointments No visits were found meeting these conditions.  Showing future appointments within next 90 days and meeting all other requirements   I discussed the assessment and treatment plan with the patient. The patient was provided an opportunity to ask questions and  all were answered. The patient agreed with the plan and demonstrated an understanding of the instructions.  Patient advised to call back or seek an in-person evaluation if the symptoms or condition worsens.  Total duration of non-face-to-face encounter: 20 minutes.  Note by: Oswaldo DoneFrancisco A Brandyn Thien, MD Date: 05/20/2019; Time: 11:26 AM  Note: This dictation was prepared with Dragon dictation. Any transcriptional errors that may result from this process are unintentional.  Disclaimer:  * Given the special circumstances of the COVID-19 pandemic, the federal government has announced that the Office for Civil Rights (OCR) will exercise its enforcement discretion and will not impose penalties on physicians using telehealth in the event of noncompliance with regulatory requirements under the [employer] Insurance Portability and Accountability Act (HIPAA) in connection with the good faith provision of telehealth during the COVID-19 national public health emergency. (AMA)

## 2019-05-17 DIAGNOSIS — I10 Essential (primary) hypertension: Secondary | ICD-10-CM | POA: Diagnosis not present

## 2019-05-17 DIAGNOSIS — Z299 Encounter for prophylactic measures, unspecified: Secondary | ICD-10-CM | POA: Diagnosis not present

## 2019-05-17 DIAGNOSIS — Z6835 Body mass index (BMI) 35.0-35.9, adult: Secondary | ICD-10-CM | POA: Diagnosis not present

## 2019-05-17 DIAGNOSIS — F419 Anxiety disorder, unspecified: Secondary | ICD-10-CM | POA: Diagnosis not present

## 2019-05-17 DIAGNOSIS — M1711 Unilateral primary osteoarthritis, right knee: Secondary | ICD-10-CM | POA: Diagnosis not present

## 2019-05-17 DIAGNOSIS — E7800 Pure hypercholesterolemia, unspecified: Secondary | ICD-10-CM | POA: Diagnosis not present

## 2019-05-20 ENCOUNTER — Encounter: Payer: Self-pay | Admitting: Nurse Practitioner

## 2019-05-20 ENCOUNTER — Ambulatory Visit: Payer: PPO | Attending: Nurse Practitioner | Admitting: Pain Medicine

## 2019-05-20 ENCOUNTER — Telehealth: Payer: Self-pay | Admitting: *Deleted

## 2019-05-20 ENCOUNTER — Other Ambulatory Visit: Payer: Self-pay

## 2019-05-20 DIAGNOSIS — G894 Chronic pain syndrome: Secondary | ICD-10-CM

## 2019-05-20 DIAGNOSIS — M7918 Myalgia, other site: Secondary | ICD-10-CM

## 2019-05-20 DIAGNOSIS — Z79899 Other long term (current) drug therapy: Secondary | ICD-10-CM | POA: Diagnosis not present

## 2019-05-20 DIAGNOSIS — M549 Dorsalgia, unspecified: Secondary | ICD-10-CM

## 2019-05-20 DIAGNOSIS — M899 Disorder of bone, unspecified: Secondary | ICD-10-CM

## 2019-05-20 DIAGNOSIS — G8929 Other chronic pain: Secondary | ICD-10-CM

## 2019-05-20 DIAGNOSIS — M5442 Lumbago with sciatica, left side: Secondary | ICD-10-CM | POA: Diagnosis not present

## 2019-05-20 DIAGNOSIS — M62838 Other muscle spasm: Secondary | ICD-10-CM | POA: Diagnosis not present

## 2019-05-20 DIAGNOSIS — M25562 Pain in left knee: Secondary | ICD-10-CM

## 2019-05-20 DIAGNOSIS — T39395A Adverse effect of other nonsteroidal anti-inflammatory drugs [NSAID], initial encounter: Secondary | ICD-10-CM

## 2019-05-20 DIAGNOSIS — R252 Cramp and spasm: Secondary | ICD-10-CM

## 2019-05-20 DIAGNOSIS — Z789 Other specified health status: Secondary | ICD-10-CM

## 2019-05-20 DIAGNOSIS — M25561 Pain in right knee: Secondary | ICD-10-CM

## 2019-05-20 DIAGNOSIS — M542 Cervicalgia: Secondary | ICD-10-CM

## 2019-05-20 DIAGNOSIS — K219 Gastro-esophageal reflux disease without esophagitis: Secondary | ICD-10-CM

## 2019-05-20 DIAGNOSIS — R937 Abnormal findings on diagnostic imaging of other parts of musculoskeletal system: Secondary | ICD-10-CM

## 2019-05-20 DIAGNOSIS — K2960 Other gastritis without bleeding: Secondary | ICD-10-CM

## 2019-05-20 DIAGNOSIS — M5441 Lumbago with sciatica, right side: Secondary | ICD-10-CM

## 2019-05-20 DIAGNOSIS — M47816 Spondylosis without myelopathy or radiculopathy, lumbar region: Secondary | ICD-10-CM

## 2019-05-20 DIAGNOSIS — M797 Fibromyalgia: Secondary | ICD-10-CM

## 2019-05-20 DIAGNOSIS — M792 Neuralgia and neuritis, unspecified: Secondary | ICD-10-CM

## 2019-05-20 MED ORDER — BACLOFEN 10 MG PO TABS: 10.0000 mg | ORAL_TABLET | Freq: Every day | ORAL | 2 refills | Status: DC

## 2019-05-20 MED ORDER — ESOMEPRAZOLE MAGNESIUM 40 MG PO CPDR: 40.0000 mg | DELAYED_RELEASE_CAPSULE | Freq: Every day | ORAL | 2 refills | Status: DC

## 2019-05-20 MED ORDER — OXYCODONE HCL 5 MG PO TABS: 5.0000 mg | ORAL_TABLET | Freq: Three times a day (TID) | ORAL | 0 refills | Status: DC | PRN

## 2019-05-20 MED ORDER — CYCLOBENZAPRINE HCL 10 MG PO TABS: 10.0000 mg | ORAL_TABLET | Freq: Three times a day (TID) | ORAL | 2 refills | Status: DC

## 2019-05-20 MED ORDER — MAGNESIUM OXIDE -MG SUPPLEMENT 500 MG PO CAPS: 1.0000 | ORAL_CAPSULE | Freq: Every day | ORAL | 2 refills | Status: DC

## 2019-05-20 MED ORDER — PREGABALIN 150 MG PO CAPS: 150.0000 mg | ORAL_CAPSULE | Freq: Three times a day (TID) | ORAL | 2 refills | Status: DC | PRN

## 2019-05-20 NOTE — Patient Instructions (Signed)

## 2019-05-29 DIAGNOSIS — Z471 Aftercare following joint replacement surgery: Secondary | ICD-10-CM | POA: Diagnosis not present

## 2019-05-29 DIAGNOSIS — M76891 Other specified enthesopathies of right lower limb, excluding foot: Secondary | ICD-10-CM | POA: Diagnosis not present

## 2019-05-29 DIAGNOSIS — Z96651 Presence of right artificial knee joint: Secondary | ICD-10-CM | POA: Diagnosis not present

## 2019-05-29 DIAGNOSIS — M25461 Effusion, right knee: Secondary | ICD-10-CM | POA: Diagnosis not present

## 2019-05-29 DIAGNOSIS — R29898 Other symptoms and signs involving the musculoskeletal system: Secondary | ICD-10-CM | POA: Diagnosis not present

## 2019-05-30 ENCOUNTER — Other Ambulatory Visit: Payer: Self-pay

## 2019-05-30 ENCOUNTER — Ambulatory Visit (HOSPITAL_BASED_OUTPATIENT_CLINIC_OR_DEPARTMENT_OTHER): Payer: PPO | Admitting: Pain Medicine

## 2019-05-30 ENCOUNTER — Ambulatory Visit
Admission: RE | Admit: 2019-05-30 | Discharge: 2019-05-30 | Disposition: A | Discharge status: Home / Self Care | Payer: PPO | Source: Ambulatory Visit | Attending: Pain Medicine | Admitting: Pain Medicine

## 2019-05-30 ENCOUNTER — Encounter: Payer: Self-pay | Admitting: Pain Medicine

## 2019-05-30 VITALS — BP 123/68 | HR 89 | Temp 97.5°F | Resp 12 | Ht 63.0 in | Wt 196.0 lb

## 2019-05-30 DIAGNOSIS — M47816 Spondylosis without myelopathy or radiculopathy, lumbar region: Secondary | ICD-10-CM | POA: Diagnosis not present

## 2019-05-30 DIAGNOSIS — M5441 Lumbago with sciatica, right side: Secondary | ICD-10-CM

## 2019-05-30 DIAGNOSIS — M5442 Lumbago with sciatica, left side: Secondary | ICD-10-CM | POA: Insufficient documentation

## 2019-05-30 DIAGNOSIS — M961 Postlaminectomy syndrome, not elsewhere classified: Secondary | ICD-10-CM | POA: Insufficient documentation

## 2019-05-30 DIAGNOSIS — M47817 Spondylosis without myelopathy or radiculopathy, lumbosacral region: Secondary | ICD-10-CM | POA: Diagnosis not present

## 2019-05-30 DIAGNOSIS — M5136 Other intervertebral disc degeneration, lumbar region: Secondary | ICD-10-CM | POA: Diagnosis not present

## 2019-05-30 DIAGNOSIS — G8929 Other chronic pain: Secondary | ICD-10-CM

## 2019-05-30 MED ORDER — MIDAZOLAM HCL 5 MG/5ML IJ SOLN
1.0000 mg | INTRAMUSCULAR | Status: DC | PRN
  Administered 2019-05-30: 10:00:00 2 mg via INTRAVENOUS
  Filled 2019-05-30: qty 5

## 2019-05-30 MED ORDER — LIDOCAINE HCL 2 % IJ SOLN
20.0000 mL | Freq: Once | INTRAMUSCULAR | Status: AC
  Administered 2019-05-30: 09:00:00 400 mg
  Filled 2019-05-30: qty 40

## 2019-05-30 MED ORDER — ROPIVACAINE HCL 2 MG/ML IJ SOLN
18.0000 mL | Freq: Once | INTRAMUSCULAR | Status: AC
  Administered 2019-05-30: 18 mL via PERINEURAL
  Filled 2019-05-30: qty 20

## 2019-05-30 MED ORDER — TRIAMCINOLONE ACETONIDE 40 MG/ML IJ SUSP
80.0000 mg | Freq: Once | INTRAMUSCULAR | Status: AC
  Administered 2019-05-30: 80 mg
  Filled 2019-05-30: qty 2

## 2019-05-30 MED ORDER — LACTATED RINGERS IV SOLN
1000.0000 mL | Freq: Once | INTRAVENOUS | Status: AC
  Administered 2019-05-30: 09:00:00 1000 mL via INTRAVENOUS

## 2019-05-30 MED ORDER — FENTANYL CITRATE (PF) 100 MCG/2ML IJ SOLN
25.0000 ug | INTRAMUSCULAR | Status: DC | PRN
  Administered 2019-05-30: 09:00:00 100 ug via INTRAVENOUS
  Filled 2019-05-30: qty 2

## 2019-05-30 NOTE — Patient Instructions (Signed)

## 2019-05-30 NOTE — Progress Notes (Signed)
Patient's Name: Jennifer Harmon  MRN: [id number]  Referring Provider: Delano MetzNaveira, Malcolm Quast, MD  DOB: 04/19/1961  PCP: Ignatius SpeckingVyas, Dhruv B, MD  DOS: 05/30/2019  Note by: Oswaldo DoneFrancisco A Roosevelt Bisher, MD  Service setting: Ambulatory outpatient  Specialty: Interventional Pain Management  Patient type: Established  Location: [facility] (AMB) Pain Management Facility  Visit type: Interventional Procedure   Primary Reason for Visit: Interventional Pain Management Treatment. CC: Back Pain (bilateral lumbar)  Procedure:          Anesthesia, Analgesia, Anxiolysis:  Type: Lumbar Facet, Medial Branch Block(s) #3  Primary Purpose: Diagnostic Region: Posterolateral Lumbosacral Spine Level: L2, L3, L4, L5, & S1 Medial Branch Level(s). Injecting these levels blocks the L3-4, L4-5, and L5-S1 lumbar facet joints. Laterality: Bilateral  Type: Moderate (Conscious) Sedation combined with Local Anesthesia Indication(s): Analgesia and Anxiety Route: Intravenous (IV) IV Access: Secured Sedation: Meaningful verbal contact was maintained at all times during the procedure  Local Anesthetic: Lidocaine 1-2%  Position: Prone   Indications: 1. Lumbar facet syndrome (Bilateral) (R>L)   2. Spondylosis without myelopathy or radiculopathy, lumbosacral region   3. DDD (degenerative disc disease), lumbar   4. Chronic low back pain  (Secondary Area of Pain) (Bilateral) (R>L)   5. Failed back surgical syndrome    Pain Score: Pre-procedure: 8 /10 Post-procedure: 0-No pain/10   Pertinent Labs  COVID-19 screennig: No results found for: [id number]  Pre-op Assessment:  Jennifer Harmon is a 58 y.o. (year old), female patient, seen today for interventional treatment. She  has a past surgical history that includes Cholecystectomy; Cesarean section; Back surgery (2008); and Joint replacement (Right, 04/16/2019). Jennifer Harmon has a current medication list which includes the following prescription(s): baclofen, clonazepam, cyclobenzaprine, esomeprazole,  latanoprost, magnesium oxide, multivitamin with minerals, mupirocin ointment, oxycodone, oxycodone, oxycodone, pregabalin, and rosuvastatin, and the following Facility-Administered Medications: fentanyl and midazolam. Her primarily concern today is the Back Pain (bilateral lumbar)  Initial Vital Signs:  Pulse/HCG Rate: 89ECG Heart Rate: 91 Temp: 98.2 F (36.8 C) Resp: 16 BP: 126/72 SpO2: 98 %  BMI: Estimated body mass index is 34.72 kg/m as calculated from the following:   Height as of this encounter: 5\' 3"  (1.6 m).   Weight as of this encounter: 196 lb (88.9 kg).  Risk Assessment: Allergies: Reviewed. She is allergic to asa [aspirin]; bupropion; and tape.  Allergy Precautions: None required Coagulopathies: Reviewed. None identified.  Blood-thinner therapy: None at this time Active Infection(s): Reviewed. None identified. Jennifer Harmon is afebrile  Site Confirmation: Jennifer Harmon was asked to confirm the procedure and laterality before marking the site Procedure checklist: Completed Consent: Before the procedure and under the influence of no sedative(s), amnesic(s), or anxiolytics, the patient was informed of the treatment options, risks and possible complications. To fulfill our ethical and legal obligations, as recommended by the American Medical Association's Code of Ethics, I have informed the patient of my clinical impression; the nature and purpose of the treatment or procedure; the risks, benefits, and possible complications of the intervention; the alternatives, including doing nothing; the risk(s) and benefit(s) of the alternative treatment(s) or procedure(s); and the risk(s) and benefit(s) of doing nothing. The patient was provided information about the general risks and possible complications associated with the procedure. These may include, but are not limited to: failure to achieve desired goals, infection, bleeding, organ or nerve damage, allergic reactions, paralysis, and  death. In addition, the patient was informed of those risks and complications associated to Spine-related procedures, such as failure to decrease pain; infection (  i.e.: Meningitis, epidural or intraspinal abscess); bleeding (i.e.: epidural hematoma, subarachnoid hemorrhage, or any other type of intraspinal or peri-dural bleeding); organ or nerve damage (i.e.: Any type of peripheral nerve, nerve root, or spinal cord injury) with subsequent damage to sensory, motor, and/or autonomic systems, resulting in permanent pain, numbness, and/or weakness of one or several areas of the body; allergic reactions; (i.e.: anaphylactic reaction); and/or death. Furthermore, the patient was informed of those risks and complications associated with the medications. These include, but are not limited to: allergic reactions (i.e.: anaphylactic or anaphylactoid reaction(s)); adrenal axis suppression; blood sugar elevation that in diabetics may result in ketoacidosis or comma; water retention that in patients with history of congestive heart failure may result in shortness of breath, pulmonary edema, and decompensation with resultant heart failure; weight gain; swelling or edema; medication-induced neural toxicity; particulate matter embolism and blood vessel occlusion with resultant organ, and/or nervous system infarction; and/or aseptic necrosis of one or more joints. Finally, the patient was informed that Medicine is not an exact science; therefore, there is also the possibility of unforeseen or unpredictable risks and/or possible complications that may result in a catastrophic outcome. The patient indicated having understood very clearly. We have given the patient no guarantees and we have made no promises. Enough time was given to the patient to ask questions, all of which were answered to the patient's satisfaction. [name] has indicated that she wanted to continue with the procedure. Attestation: I, the ordering provider,  attest that I have discussed with the patient the benefits, risks, side-effects, alternatives, likelihood of achieving goals, and potential problems during recovery for the procedure that I have provided informed consent. Date   Time: 05/30/2019  8:27 AM  Pre-Procedure Preparation:  Monitoring: As per clinic protocol. Respiration, ETCO2, SpO2, BP, heart rate and rhythm monitor placed and checked for adequate function Safety Precautions: Patient was assessed for positional comfort and pressure points before starting the procedure. Time-out: I initiated and conducted the "Time-out" before starting the procedure, as per protocol. The patient was asked to participate by confirming the accuracy of the "Time Out" information. Verification of the correct person, site, and procedure were performed and confirmed by me, the nursing staff, and the patient. "Time-out" conducted as per Joint Commission's Universal Protocol (UP.01.01.01). Time: 0918  Description of Procedure:          Laterality: Bilateral. The procedure was performed in identical fashion on both sides. Levels:  L2, L3, L4, L5, & S1 Medial Branch Level(s) Area Prepped: Posterior Lumbosacral Region Prepping solution: DuraPrep (Iodine Povacrylex [0.7% available iodine] and Isopropyl Alcohol, 74% w/w) Safety Precautions: Aspiration looking for blood return was conducted prior to all injections. At no point did we inject any substances, as a needle was being advanced. Before injecting, the patient was told to immediately notify me if she was experiencing any new onset of "ringing in the ears, or metallic taste in the mouth". No attempts were made at seeking any paresthesias. Safe injection practices and needle disposal techniques used. Medications properly checked for expiration dates. SDV (single dose vial) medications used. After the completion of the procedure, all disposable equipment used was discarded in the proper designated medical waste  containers. Local Anesthesia: Protocol guidelines were followed. The patient was positioned over the fluoroscopy table. The area was prepped in the usual manner. The time-out was completed. The target area was identified using fluoroscopy. A 12-in long, straight, sterile hemostat was used with fluoroscopic guidance to locate the targets for  each level blocked. Once located, the skin was marked with an approved surgical skin marker. Once all sites were marked, the skin (epidermis, dermis, and hypodermis), as well as deeper tissues (fat, connective tissue and muscle) were infiltrated with a small amount of a short-acting local anesthetic, loaded on a 10cc syringe with a 25G, 1.5-in  Needle. An appropriate amount of time was allowed for local anesthetics to take effect before proceeding to the next step. Local Anesthetic: Lidocaine 2.0% The unused portion of the local anesthetic was discarded in the proper designated containers. Technical explanation of process:  L2 Medial Branch Nerve Block (MBB): The target area for the L2 medial branch is at the junction of the postero-lateral aspect of the superior articular process and the superior, posterior, and medial edge of the transverse process of L3. Under fluoroscopic guidance, a Quincke needle was inserted until contact was made with os over the superior postero-lateral aspect of the pedicular shadow (target area). After negative aspiration for blood, 0.5 mL of the nerve block solution was injected without difficulty or complication. The needle was removed intact. L3 Medial Branch Nerve Block (MBB): The target area for the L3 medial branch is at the junction of the postero-lateral aspect of the superior articular process and the superior, posterior, and medial edge of the transverse process of L4. Under fluoroscopic guidance, a Quincke needle was inserted until contact was made with os over the superior postero-lateral aspect of the pedicular shadow (target area).  After negative aspiration for blood, 0.5 mL of the nerve block solution was injected without difficulty or complication. The needle was removed intact. L4 Medial Branch Nerve Block (MBB): The target area for the L4 medial branch is at the junction of the postero-lateral aspect of the superior articular process and the superior, posterior, and medial edge of the transverse process of L5. Under fluoroscopic guidance, a Quincke needle was inserted until contact was made with os over the superior postero-lateral aspect of the pedicular shadow (target area). After negative aspiration for blood, 0.5 mL of the nerve block solution was injected without difficulty or complication. The needle was removed intact. L5 Medial Branch Nerve Block (MBB): The target area for the L5 medial branch is at the junction of the postero-lateral aspect of the superior articular process and the superior, posterior, and medial edge of the sacral ala. Under fluoroscopic guidance, a Quincke needle was inserted until contact was made with os over the superior postero-lateral aspect of the pedicular shadow (target area). After negative aspiration for blood, 0.5 mL of the nerve block solution was injected without difficulty or complication. The needle was removed intact. S1 Medial Branch Nerve Block (MBB): The target area for the S1 medial branch is at the posterior and inferior 6 o'clock position of the L5-S1 facet joint. Under fluoroscopic guidance, the Quincke needle inserted for the L5 MBB was redirected until contact was made with os over the inferior and postero aspect of the sacrum, at the 6 o' clock position under the L5-S1 facet joint (Target area). After negative aspiration for blood, 0.5 mL of the nerve block solution was injected without difficulty or complication. The needle was removed intact.  Nerve block solution: 0.2% PF-Ropivacaine + Triamcinolone (40 mg/mL) diluted to a final concentration of 4 mg of Triamcinolone/mL of  Ropivacaine The unused portion of the solution was discarded in the proper designated containers. Procedural Needles: 22-gauge, 3.5-inch, Quincke needles used for all levels.  Once the entire procedure was completed, the treated area was  cleaned, making sure to leave some of the prepping solution back to take advantage of its long term bactericidal properties.   Illustration of the posterior view of the lumbar spine and the posterior neural structures. Laminae of L2 through S1 are labeled. DPRL5, dorsal primary ramus of L5; DPRS1, dorsal primary ramus of S1; DPR3, dorsal primary ramus of L3; FJ, facet (zygapophyseal) joint L3-L4; I, inferior articular process of L4; LB1, lateral branch of dorsal primary ramus of L1; IAB, inferior articular branches from L3 medial branch (supplies L4-L5 facet joint); IBP, intermediate branch plexus; MB3, medial branch of dorsal primary ramus of L3; NR3, third lumbar nerve root; S, superior articular process of L5; SAB, superior articular branches from L4 (supplies L4-5 facet joint also); TP3, transverse process of L3.  Vitals:   05/30/19 0934 05/30/19 0944 05/30/19 0954 05/30/19 1004  BP: (!) 153/80 138/88 132/67 123/68  Pulse:      Resp: (!) 26 (!) 24 12   Temp:  (!) 97.4 F (36.3 C)  (!) 97.5 F (36.4 C)  TempSrc:    Temporal  SpO2: 100% 94% (!) 89% 95%  Weight:      Height:         Start Time: 0918 hrs. End Time: 0934 hrs.  Imaging Guidance (Spinal):          Type of Imaging Technique: Fluoroscopy Guidance (Spinal) Indication(s): Assistance in needle guidance and placement for procedures requiring needle placement in or near specific anatomical locations not easily accessible without such assistance. Exposure Time: Please see nurses notes. Contrast: None used. Fluoroscopic Guidance: I was personally present during the use of fluoroscopy. "Tunnel Vision Technique" used to obtain the best possible view of the target area. Parallax error corrected before  commencing the procedure. "Direction-depth-direction" technique used to introduce the needle under continuous pulsed fluoroscopy. Once target was reached, antero-posterior, oblique, and lateral fluoroscopic projection used confirm needle placement in all planes. Images permanently stored in EMR. Interpretation: No contrast injected. I personally interpreted the imaging intraoperatively. Adequate needle placement confirmed in multiple planes. Permanent images saved into the patient's record.  Antibiotic Prophylaxis:   Anti-infectives (From admission, onward)   None     Indication(s): None identified  Post-operative Assessment:  Post-procedure Vital Signs:  Pulse/HCG Rate: 8994 Temp: (!) 97.5 F (36.4 C) Resp: 12 BP: 123/68 SpO2: 95 %  EBL: None  Complications: No immediate post-treatment complications observed by team, or reported by patient.  Note: The patient tolerated the entire procedure well. A repeat set of vitals were taken after the procedure and the patient was kept under observation following institutional policy, for this type of procedure. Post-procedural neurological assessment was performed, showing return to baseline, prior to discharge. The patient was provided with post-procedure discharge instructions, including a section on how to identify potential problems. Should any problems arise concerning this procedure, the patient was given instructions to immediately contact us, at any time, without hesitation. In any case, we plan to contact the patient by telephone for a follow-up status report regarding this interventional procedure.  Comments:  No additional relevant information.  Plan of Care  Orders:  Orders Placed This Encounter  Procedures   LUMBAR FACET(MEDIAL BRANCH NERVE BLOCK) MBNB    Scheduling Instructions:     Side: Bilateral     Level: L3-4, L4-5, & L5-S1 Facets (L2, L3, L4, L5, & S1 Medial Branch Nerves)     Sedation: With Sedation.     Timeframe:  Today    Order Specific  Question:   Where will this procedure be performed?    Answer:   [facility]   DG PAIN [facility] C-ARM 1-60 MIN NO REPORT    Intraoperative interpretation by procedural physician at [facility].    Standing Status:   Standing    Number of Occurrences:   1    Order Specific Question:   Reason for exam:    Answer:   Assistance in needle guidance and placement for procedures requiring needle placement in or near specific anatomical locations not easily accessible without such assistance.   Provider attestation of informed consent for procedure/surgical case    I, the ordering provider, attest that I have discussed with the patient the benefits, risks, side effects, alternatives, likelihood of achieving goals and potential problems during recovery for the procedure that I have provided informed consent.    Standing Status:   Standing    Number of Occurrences:   1   Informed Consent Details: Transcribe to consent form and obtain patient signature    Standing Status:   Standing    Number of Occurrences:   1    Order Specific Question:   Procedure    Answer:   Bilateral Lumbar facet block (medial branch block) under fluoroscopic guidance. (See notes for levels.)    Order Specific Question:   Surgeon    Answer:   Charrie Mcconnon A. Laban Emperor, MD    Order Specific Question:   Indication/Reason    Answer:   Bilateral low back pain with or without lower extremity pain   Chronic Opioid Analgesic:  Oxycodone IR 5 mg, 1 tab PO q 8 hrs (15 mg/day of oxycodone) MME/day: 22.5 mg/day.   Medications ordered for procedure: Meds ordered this encounter  Medications   lidocaine (XYLOCAINE) 2 % (with pres) injection 400 mg   lactated ringers infusion 1,000 mL   midazolam (VERSED) 5 MG/5ML injection 1-2 mg    Make sure Flumazenil is available in the pyxis when using this medication. If oversedation occurs, administer 0.2 mg IV over 15 sec. If after 45 sec no response,  administer 0.2 mg again over 1 min; may repeat at 1 min intervals; not to exceed 4 doses (1 mg)   fentaNYL (SUBLIMAZE) injection 25-50 mcg    Make sure Narcan is available in the pyxis when using this medication. In the event of respiratory depression (RR< 8/min): Titrate NARCAN (naloxone) in increments of 0.1 to 0.2 mg IV at 2-3 minute intervals, until desired degree of reversal.   ropivacaine (PF) 2 mg/mL (0.2%) (NAROPIN) injection 18 mL   triamcinolone acetonide (KENALOG-40) injection 80 mg   Medications administered: We administered lidocaine, lactated ringers, midazolam, fentaNYL, ropivacaine (PF) 2 mg/mL (0.2%), and triamcinolone acetonide.  See the medical record for exact dosing, route, and time of administration.  Follow-up plan:   Return for (VV), 2 wk PP-F/U Eval.       Interventional management options: Considering:   Diagnostic Trigger point injections Diagnostic bilateral LESI Possiblebilateral Racz(Epidural Neurolysis) Diagnostic thoracic facet nerve block Possible thoracic facet RFA Diagnostic bilateral CESI Diagnosticbilateral cervical facet nerve block Possiblebilateral cervical facet RFA Diagnostic right shoulder intra-articular injection Diagnostic right suprascapular nerve block Possibleright suprascapular RFA Diagnostic bilateral genicular nerve block Possible bilateral genicular RFA   Palliative PRN treatment(s):   Palliative right-sided T6-7 TESI #2  Palliative bilateral lumbar facet block #4  Palliative right-sided sacroiliac joint block #3  Palliative right-sided intra-articular hip joint injections  Palliative right-sided lumbar facet RFA #2 (last done  on 05/03/2018)  Palliative right-sided sacroiliac joint RFA #2 (last done on 05/03/2018)  Palliative left-sided lumbar facet RFA #2 (last done 08/16/2018)  Palliative right-sided genicular nerve block #2      Recent Visits Date Type Provider Dept  05/20/19 Office Visit Delano Metz, MD [facility]  Showing recent visits within past 90 days and meeting all other requirements   Today's Visits Date Type Provider Dept  05/30/19 Procedure visit Delano Metz, MD [facility]  Showing today's visits and meeting all other requirements   Future Appointments Date Type Provider Dept  06/27/19 Appointment Delano Metz, MD [facility]  07/31/19 Appointment Delano Metz, MD [facility]  Showing future appointments within next 90 days and meeting all other requirements   Disposition: Discharge home  Discharge Date & Time: 05/30/2019; 1015 hrs.   Primary Care Physician: Ignatius Specking, MD Location: [facility] Note by: Oswaldo Done, MD Date: 05/30/2019; Time: 10:50 AM  Disclaimer:  Medicine is not an Visual merchandiser. The only guarantee in medicine is that nothing is guaranteed. It is important to note that the decision to proceed with this intervention was based on the information collected from the patient. The Data and conclusions were drawn from the patient's questionnaire, the interview, and the physical examination. Because the information was provided in large part by the patient, it cannot be guaranteed that it has not been purposely or unconsciously manipulated. Every effort has been made to obtain as much relevant data as possible for this evaluation. It is important to note that the conclusions that lead to this procedure are derived in large part from the available data. Always take into account that the treatment will also be dependent on availability of resources and existing treatment guidelines, considered by other Pain Management Practitioners as being common knowledge and practice, at the time of the intervention. For Medico-Legal purposes, it is also important to point out that variation in procedural techniques and pharmacological choices are the acceptable norm. The  indications, contraindications, technique, and results of the above procedure should only be interpreted and judged by a Board-Certified Interventional Pain Specialist with extensive familiarity and expertise in the same exact procedure and technique.

## 2019-05-31 ENCOUNTER — Telehealth: Payer: Self-pay | Admitting: *Deleted

## 2019-05-31 NOTE — Telephone Encounter (Signed)
Voicemail left with patient re; procedure on yesterday.  Instructed to call if there were questions or concerns.

## 2019-06-10 DIAGNOSIS — M25561 Pain in right knee: Secondary | ICD-10-CM | POA: Diagnosis not present

## 2019-06-10 DIAGNOSIS — Z96651 Presence of right artificial knee joint: Secondary | ICD-10-CM | POA: Diagnosis not present

## 2019-06-10 DIAGNOSIS — Z471 Aftercare following joint replacement surgery: Secondary | ICD-10-CM | POA: Diagnosis not present

## 2019-06-26 NOTE — Progress Notes (Signed)
Pain Management Virtual Encounter Note - Virtual Visit via Telephone Telehealth (real-time audio visits between healthcare provider and patient).   Patient's Phone No. & Preferred Pharmacy:  [phone] (home); [phone] (mobile); (Preferred) [phone] No e-mail address on record  [facility] - [place], [place] - [address] [place] [id number] Phone: [phone] Fax: [phone]    Pre-screening note:  Our staff contacted [name] and offered her an "in person", "face-to-face" appointment versus a telephone encounter. She indicated preferring the telephone encounter, at this time.   Reason for Virtual Visit: COVID-19*   Social distancing based on CDC and AMA recommendations.   I contacted Jennifer Harmon on 06/27/2019 via telephone.      I clearly identified myself as Oswaldo DoneFrancisco A Najia Hurlbutt, MD. I verified that I was speaking with the correct person using two identifiers (Name: Jennifer Harmon, and date of birth: 01/01/1961).  Advanced Informed Consent I sought verbal advanced consent from Jennifer Harmon for virtual visit interactions. I informed Jennifer Harmon of possible security and privacy concerns, risks, and limitations associated with providing "not-in-person" medical evaluation and management services. I also informed Jennifer Harmon of the availability of "in-person" appointments. Finally, I informed her that there would be a charge for the virtual visit and that she could be  personally, fully or partially, financially responsible for it. Jennifer Harmon expressed understanding and agreed to proceed.   Historic Elements   Jennifer Harmon is a 58 y.o. year old, female patient evaluated today after her last encounter by our practice on 05/31/2019. Jennifer Harmon  has a past medical history of Acute postoperative pain (05/03/2018), Anxiety, Chronic chest pain, Depression, DJD (degenerative joint disease), Fibromyalgia, GERD (gastroesophageal reflux  disease), and Hypercholesterolemia. She also  has a past surgical history that includes Cholecystectomy; Cesarean section; Back surgery (2008); and Joint replacement (Right, 04/16/2019). Jennifer Harmon has a current medication list which includes the following prescription(s): baclofen, clonazepam, cyclobenzaprine, esomeprazole, latanoprost, magnesium oxide, multivitamin with minerals, mupirocin ointment, oxycodone, oxycodone, pregabalin, rosuvastatin, and oxycodone. She  reports that she quit smoking about 3 years ago. Her smoking use included pipe. She has a 10.00 pack-year smoking history. She uses smokeless tobacco. She reports that she does not drink alcohol or use drugs. Jennifer Harmon is allergic to asa [aspirin]; bupropion; and tape.   HPI  Today, she is being contacted for a post-procedure assessment.  Post-Procedure Evaluation  Procedure: Diagnostic bilateral lumbar facet block #3 under fluoroscopic guidance and IV sedation Pre-procedure pain level:  8/10 Post-procedure: 0/10 (100% relief)  Sedation: Sedation provided.  Effectiveness during initial hour after procedure(Ultra-Short Term Relief): 100 %   Local anesthetic used: Long-acting (4-6 hours) Effectiveness: Defined as any analgesic benefit obtained secondary to the administration of local anesthetics. This carries significant diagnostic value as to the etiological location, or anatomical origin, of the pain. Duration of benefit is expected to coincide with the duration of the local anesthetic used.  Effectiveness during initial 4-6 hours after procedure(Short-Term Relief): 100 %   Long-term benefit: Defined as any relief past the pharmacologic duration of the local anesthetics.  Effectiveness past the initial 6 hours after procedure(Long-Term Relief): 100 % (last 8 days)   Current benefits: Defined as benefit that persist at this time.   Analgesia:  >50% relief Function: Somewhat improved ROM: Somewhat improved   At this point, this  patient could be a reasonable candidate for radiofrequency ablation.  Although the anatomy is distorted because  of the prior surgeries, she has been getting good benefit from the diagnostic injections.  At this point, I am willing to go ahead and try a radiofrequency on the right side, which is her worst side, and if this is effective then we can do the opposite side.   Medical Necessity: Jennifer Harmon has experienced debilitating chronic pain from the Lumbar Facet Syndrome (Spondylosis without myelopathy or radiculopathy, lumbar region [M47.816]) that has persisted for longer than three months of failed non-surgical care and has either failed to respond, or was unable to tolerate, or simply did not get enough benefit from other more conservative therapies including, but not limited to: 1. Over-the-counter oral analgesic medications (i.e.: ibuprofen, naproxen, etc.) 2. Anti-inflammatory medications 3. Muscle relaxants 4. Membrane stabilizers 5. Opioids 6. Physical therapy (PT), chiropractic manipulation, and/or home exercise program (HEP). 7. Modalities (Heat, ice, etc.) 8. Invasive techniques such as nerve blocks and/or surgery.  Jennifer Harmon has attained greater than 50% reduction in pain from at least two (2) diagnostic medial branch blocks conducted in separate occasions. For this reason, I believe it is medically necessary to proceed with Non-Pulsed Radiofrequency Ablation for the purpose of attempting to prolong the duration of the benefits seen with the diagnostic injections.   Pharmacotherapy Assessment  Analgesic: Oxycodone IR 5 mg, 1 tab PO q 8 hrs (15 mg/day of oxycodone) MME/day: 22.5 mg/day.   Monitoring: Pharmacotherapy: No side-effects or adverse reactions reported. [place] PMP: PDMP reviewed during this encounter.       Compliance: No problems identified. Effectiveness: Clinically acceptable. Plan: Refer to "POC".  UDS:  Summary  Date Value Ref Range Status  12/24/2018 FINAL   Final    Comment:    ==================================================================== TOXASSURE SELECT 13 (MW) ==================================================================== Test                             Result       Flag       Units Drug Present and Declared for Prescription Verification   7-aminoclonazepam              315          EXPECTED   ng/mg creat    7-aminoclonazepam is an expected metabolite of clonazepam. Source    of clonazepam is a scheduled prescription medication.   Oxycodone                      221          EXPECTED   ng/mg creat   Oxymorphone                    247          EXPECTED   ng/mg creat   Noroxycodone                   573          EXPECTED   ng/mg creat   Noroxymorphone                 111          EXPECTED   ng/mg creat    Sources of oxycodone are scheduled prescription medications.    Oxymorphone, noroxycodone, and noroxymorphone are expected    metabolites of oxycodone. Oxymorphone is also available as a    scheduled prescription medication. ==================================================================== Test  Result    Flag   Units      Ref Range   Creatinine              112              mg/dL      >=72 ==================================================================== Declared Medications:  The flagging and interpretation on this report are based on the  following declared medications.  Unexpected results may arise from  inaccuracies in the declared medications.  **Note: The testing scope of this panel includes these medications:  Clonazepam  Oxycodone  **Note: The testing scope of this panel does not include following  reported medications:  Citalopram  Cyclobenzaprine  Latanoprost  Magnesium Oxide  Multivitamin  Pregabalin  Ranitidine  Rosuvastatin ==================================================================== For clinical consultation, please call [phone]. ====================================================================    Laboratory Chemistry Profile (12 mo)  Renal: No results found for requested labs within last 8760 hours.  Lab Results  Component Value Date   GFRAA >60 09/06/2017   GFRNONAA >60 09/06/2017   Hepatic: No results found for requested labs within last 8760 hours. Lab Results  Component Value Date   AST 26 09/06/2017   ALT 31 09/06/2017   Other: No results found for requested labs within last 8760 hours. Note: Above Lab results reviewed.  Imaging  Last 90 days:  Mr Cervical Spine Wo Contrast  Result Date: 04/12/2019 CLINICAL DATA:  Neck, right shoulder and right arm pain and weakness. EXAM: MRI CERVICAL SPINE WITHOUT CONTRAST TECHNIQUE: Multiplanar, multisequence MR imaging of the cervical spine was performed. No intravenous contrast was administered. COMPARISON:  None. FINDINGS: Alignment: Normal overall alignment. Mild straightening of the normal cervical lordosis. Vertebrae: Mild degenerative endplate reactive changes but no bone lesions or fracture. Cord: Normal MR appearance of the cervical spinal cord. No cord lesions or syrinx. Posterior Fossa, vertebral arteries, paraspinal tissues: Moderate degenerative changes at C1-2 but no significant pannus formation. Disc levels: C2-3: No significant findings. C3-4: Mild degenerative disc disease and right-sided facet disease but no disc protrusions, spinal or foraminal stenosis. C4-5: Bulging annulus and mild osteophytic ridging with slight flattening of the ventral thecal sac and slight narrowing the ventral CSF space. There is also right-sided uncinate spurring change contributing to mild right foraminal narrowing. C5-6: Mild annular bulge but no spinal stenosis. Uncinate spurring changes bilaterally contributing to mild bilateral foraminal narrowing. C6-7: Degenerative disc disease with a slight bulging annulus but no spinal stenosis. Right-sided disc osteophyte  complex contributing to mild right foraminal stenosis. C7-T1: No significant findings. IMPRESSION: 1. Mild-to-moderate degenerative cervical spondylosis with disc disease and facet disease. 2. Mild right foraminal stenosis at C4-5 and C6-7. 3. Mild bilateral foraminal stenosis at C5-6. Electronically Signed   By: Rudie Meyer M.D.   On: 04/12/2019 11:17   [facility] C-arm 1-60 Min No Report  Result Date: 05/30/2019 Fluoro was used, but no Radiologist interpretation will be provided. Please refer to "NOTES" tab for provider progress note.      Assessment  The primary encounter diagnosis was Chronic pain syndrome. Diagnoses of Chronic upper back pain (Primary Area of Pain) (Bilateral) (R>L), Chronic low back pain  (Secondary Area of Pain) (Bilateral) (R>L), Chronic neck pain (Tertiary Area of Pain) (Bilateral) (R>L), Chronic knee pain (Fourth Area of Pain) (Bilateral) (R>L), and Lumbar facet syndrome (Bilateral) (R>L) were also pertinent to this visit.  Plan of Care  I am having Jennifer Harmon maintain her clonazePAM, latanoprost, rosuvastatin, multivitamin with minerals, cyclobenzaprine, baclofen, Magnesium  Oxide, esomeprazole, pregabalin, oxyCODONE, oxyCODONE, oxyCODONE, and mupirocin ointment.  Pharmacotherapy (Medications Ordered): No orders of the defined types were placed in this encounter.  Orders:  Orders Placed This Encounter  Procedures   Radiofrequency,Lumbar    Standing Status:   Future    Standing Expiration Date:   12/27/2020    Scheduling Instructions:     Side(s): Right-sided     Level: L3-4, L4-5, & L5-S1 Facets (L2, L3, L4, L5, & S1 Medial Branch Nerves)     Sedation: With Sedation     Scheduling Timeframe: As soon as pre-approved    Order Specific Question:   Where will this procedure be performed?    Answer:   [facility]   Follow-up plan:   Return for RFA: (R) L-FCT #1, (ASAP).      Interventional management options: Considering:   Diagnostic  Trigger point injections Diagnostic bilateral LESI Possiblebilateral Racz(Epidural Neurolysis) Diagnostic thoracic facet nerve block Possible thoracic facet RFA Diagnostic bilateral CESI Diagnosticbilateral cervical facet nerve block Possiblebilateral cervical facet RFA Diagnostic right shoulder intra-articular injection Diagnostic right suprascapular nerve block Possibleright suprascapular RFA Diagnostic bilateral genicular nerve block Possible bilateral genicular RFA   Palliative PRN treatment(s):   Palliative right-sided T6-7 TESI #2  Palliative bilateral lumbar facet block #4  Palliative right-sided sacroiliac joint block #3  Palliative right-sided intra-articular hip joint injections  Palliative right-sided lumbar facet RFA #2 (last done on 05/03/2018)  Palliative right-sided sacroiliac joint RFA #2 (last done on 05/03/2018)  Palliative left-sided lumbar facet RFA #2 (last done 08/16/2018)  Palliative right-sided genicular nerve block #2       Recent Visits Date Type Provider Dept  05/30/19 Procedure visit Delano MetzNaveira, Tavarus Poteete, MD [facility]  05/20/19 Office Visit Delano MetzNaveira, Belanna Manring, MD [facility]  Showing recent visits within past 90 days and meeting all other requirements   Today's Visits Date Type Provider Dept  06/27/19 Office Visit Delano MetzNaveira, Brenisha Tsui, MD [facility]  Showing today's visits and meeting all other requirements   Future Appointments Date Type Provider Dept  07/31/19 Appointment Delano MetzNaveira, Marshell Dilauro, MD [facility]  Showing future appointments within next 90 days and meeting all other requirements   I discussed the assessment and treatment plan with the patient. The patient was provided an opportunity to ask questions and all were answered. The patient agreed with the plan and demonstrated an understanding of the instructions.  Patient advised to call back or seek an in-person evaluation if the  symptoms or condition worsens.  Total duration of non-face-to-face encounter: 15 minutes.  Note by: Oswaldo DoneFrancisco A Alyn Riedinger, MD Date: 06/27/2019; Time: 1:16 PM  Note: This dictation was prepared with Dragon dictation. Any transcriptional errors that may result from this process are unintentional.  Disclaimer:  * Given the special circumstances of the COVID-19 pandemic, the federal government has announced that the Office for Civil Rights (OCR) will exercise its enforcement discretion and will not impose penalties on physicians using telehealth in the event of noncompliance with regulatory requirements under the [employer] Insurance Portability and Accountability Act (HIPAA) in connection with the good faith provision of telehealth during the COVID-19 national public health emergency. (AMA)

## 2019-06-27 ENCOUNTER — Other Ambulatory Visit: Payer: Self-pay

## 2019-06-27 ENCOUNTER — Encounter: Payer: Self-pay | Admitting: Pain Medicine

## 2019-06-27 ENCOUNTER — Ambulatory Visit: Payer: PPO | Attending: Pain Medicine | Admitting: Pain Medicine

## 2019-06-27 DIAGNOSIS — G8929 Other chronic pain: Secondary | ICD-10-CM

## 2019-06-27 DIAGNOSIS — M5442 Lumbago with sciatica, left side: Secondary | ICD-10-CM

## 2019-06-27 DIAGNOSIS — M47816 Spondylosis without myelopathy or radiculopathy, lumbar region: Secondary | ICD-10-CM

## 2019-06-27 DIAGNOSIS — M5441 Lumbago with sciatica, right side: Secondary | ICD-10-CM

## 2019-06-27 DIAGNOSIS — M549 Dorsalgia, unspecified: Secondary | ICD-10-CM

## 2019-06-27 DIAGNOSIS — M25561 Pain in right knee: Secondary | ICD-10-CM | POA: Diagnosis not present

## 2019-06-27 DIAGNOSIS — G894 Chronic pain syndrome: Secondary | ICD-10-CM | POA: Diagnosis not present

## 2019-06-27 DIAGNOSIS — M25562 Pain in left knee: Secondary | ICD-10-CM | POA: Diagnosis not present

## 2019-06-27 DIAGNOSIS — M542 Cervicalgia: Secondary | ICD-10-CM | POA: Diagnosis not present

## 2019-06-27 NOTE — Patient Instructions (Signed)

## 2019-07-22 DIAGNOSIS — M25561 Pain in right knee: Secondary | ICD-10-CM | POA: Diagnosis not present

## 2019-07-22 DIAGNOSIS — Z471 Aftercare following joint replacement surgery: Secondary | ICD-10-CM | POA: Diagnosis not present

## 2019-07-22 DIAGNOSIS — Z96651 Presence of right artificial knee joint: Secondary | ICD-10-CM | POA: Diagnosis not present

## 2019-07-22 DIAGNOSIS — Z87891 Personal history of nicotine dependence: Secondary | ICD-10-CM | POA: Diagnosis not present

## 2019-07-22 DIAGNOSIS — Z299 Encounter for prophylactic measures, unspecified: Secondary | ICD-10-CM | POA: Diagnosis not present

## 2019-07-22 DIAGNOSIS — I10 Essential (primary) hypertension: Secondary | ICD-10-CM | POA: Diagnosis not present

## 2019-07-22 DIAGNOSIS — G4730 Sleep apnea, unspecified: Secondary | ICD-10-CM | POA: Diagnosis not present

## 2019-07-22 DIAGNOSIS — Z6833 Body mass index (BMI) 33.0-33.9, adult: Secondary | ICD-10-CM | POA: Diagnosis not present

## 2019-07-22 DIAGNOSIS — F419 Anxiety disorder, unspecified: Secondary | ICD-10-CM | POA: Diagnosis not present

## 2019-07-25 ENCOUNTER — Other Ambulatory Visit: Payer: Self-pay

## 2019-07-25 ENCOUNTER — Encounter: Payer: Self-pay | Admitting: Pain Medicine

## 2019-07-25 ENCOUNTER — Ambulatory Visit (HOSPITAL_BASED_OUTPATIENT_CLINIC_OR_DEPARTMENT_OTHER): Payer: PPO | Admitting: Pain Medicine

## 2019-07-25 ENCOUNTER — Ambulatory Visit
Admission: RE | Admit: 2019-07-25 | Discharge: 2019-07-25 | Disposition: A | Discharge status: Home / Self Care | Payer: PPO | Source: Ambulatory Visit | Attending: Pain Medicine | Admitting: Pain Medicine

## 2019-07-25 VITALS — BP 134/74 | HR 91 | Temp 97.3°F | Resp 15 | Ht 63.0 in | Wt 190.0 lb

## 2019-07-25 DIAGNOSIS — M5442 Lumbago with sciatica, left side: Secondary | ICD-10-CM | POA: Diagnosis not present

## 2019-07-25 DIAGNOSIS — M961 Postlaminectomy syndrome, not elsewhere classified: Secondary | ICD-10-CM | POA: Diagnosis not present

## 2019-07-25 DIAGNOSIS — M51369 Other intervertebral disc degeneration, lumbar region without mention of lumbar back pain or lower extremity pain: Secondary | ICD-10-CM

## 2019-07-25 DIAGNOSIS — M47817 Spondylosis without myelopathy or radiculopathy, lumbosacral region: Secondary | ICD-10-CM

## 2019-07-25 DIAGNOSIS — M47816 Spondylosis without myelopathy or radiculopathy, lumbar region: Secondary | ICD-10-CM | POA: Insufficient documentation

## 2019-07-25 DIAGNOSIS — M5136 Other intervertebral disc degeneration, lumbar region: Secondary | ICD-10-CM | POA: Insufficient documentation

## 2019-07-25 DIAGNOSIS — G8918 Other acute postprocedural pain: Secondary | ICD-10-CM | POA: Diagnosis not present

## 2019-07-25 DIAGNOSIS — M5441 Lumbago with sciatica, right side: Secondary | ICD-10-CM | POA: Insufficient documentation

## 2019-07-25 DIAGNOSIS — M4810 Ankylosing hyperostosis [Forestier], site unspecified: Secondary | ICD-10-CM | POA: Insufficient documentation

## 2019-07-25 DIAGNOSIS — G894 Chronic pain syndrome: Secondary | ICD-10-CM | POA: Diagnosis not present

## 2019-07-25 DIAGNOSIS — G8929 Other chronic pain: Secondary | ICD-10-CM | POA: Diagnosis not present

## 2019-07-25 MED ORDER — MIDAZOLAM HCL 5 MG/5ML IJ SOLN
1.0000 mg | INTRAMUSCULAR | Status: DC | PRN
  Administered 2019-07-25: 2 mg via INTRAVENOUS
  Filled 2019-07-25: qty 5

## 2019-07-25 MED ORDER — OXYCODONE HCL 5 MG PO TABS: 5.0000 mg | ORAL_TABLET | Freq: Three times a day (TID) | ORAL | 0 refills | Status: DC | PRN

## 2019-07-25 MED ORDER — TRIAMCINOLONE ACETONIDE 40 MG/ML IJ SUSP
40.0000 mg | Freq: Once | INTRAMUSCULAR | Status: AC
  Administered 2019-07-25: 11:00:00 40 mg
  Filled 2019-07-25: qty 1

## 2019-07-25 MED ORDER — HYDROCODONE-ACETAMINOPHEN 5-325 MG PO TABS: 1.0000 | ORAL_TABLET | Freq: Three times a day (TID) | ORAL | 0 refills | Status: AC | PRN

## 2019-07-25 MED ORDER — LIDOCAINE HCL 2 % IJ SOLN
20.0000 mL | Freq: Once | INTRAMUSCULAR | Status: AC
  Administered 2019-07-25: 400 mg
  Filled 2019-07-25: qty 20

## 2019-07-25 MED ORDER — FENTANYL CITRATE (PF) 100 MCG/2ML IJ SOLN
25.0000 ug | INTRAMUSCULAR | Status: DC | PRN
  Administered 2019-07-25: 50 ug via INTRAVENOUS
  Filled 2019-07-25: qty 2

## 2019-07-25 MED ORDER — HYDROCODONE-ACETAMINOPHEN 5-325 MG PO TABS: 1.0000 | ORAL_TABLET | Freq: Three times a day (TID) | ORAL | 0 refills | Status: DC | PRN

## 2019-07-25 MED ORDER — LACTATED RINGERS IV SOLN
1000.0000 mL | Freq: Once | INTRAVENOUS | Status: AC
  Administered 2019-07-25: 1000 mL via INTRAVENOUS

## 2019-07-25 MED ORDER — ROPIVACAINE HCL 2 MG/ML IJ SOLN
9.0000 mL | Freq: Once | INTRAMUSCULAR | Status: AC
  Administered 2019-07-25: 9 mL via PERINEURAL
  Filled 2019-07-25: qty 10

## 2019-07-25 NOTE — Progress Notes (Signed)
Patient's Name: Jennifer Harmon  MRN: [id number]  Referring Provider: Ignatius Specking, MD  DOB: 1961-07-07  PCP: Ignatius Specking, MD  DOS: 07/25/2019  Note by: Oswaldo Done, MD  Service setting: Ambulatory outpatient  Specialty: Interventional Pain Management  Patient type: Established  Location: [facility] (AMB) Pain Management Facility  Visit type: Interventional Procedure   Primary Reason for Visit: Interventional Pain Management Treatment. CC: Back Pain (lower, right)  Procedure:          Anesthesia, Analgesia, Anxiolysis:  Type: Thermal Lumbar Facet, Medial Branch Radiofrequency Ablation/Neurotomy  #1  Primary Purpose: Therapeutic Region: Posterolateral Lumbosacral Spine Level: L2, L3, L4, L5, & S1 Medial Branch Level(s). These levels will denervate the L3-4, L4-5, and the L5-S1 lumbar facet joints. Laterality: Right  Type: Moderate (Conscious) Sedation combined with Local Anesthesia Indication(s): Analgesia and Anxiety Route: Intravenous (IV) IV Access: Secured Sedation: Meaningful verbal contact was maintained at all times during the procedure  Local Anesthetic: Lidocaine 1-2%  Position: Prone   Indications: 1. Lumbar facet syndrome (Bilateral) (R>L)   2. Spondylosis without myelopathy or radiculopathy, lumbosacral region   3. Chronic low back pain  (Secondary Area of Pain) (Bilateral) (R>L)   4. DDD (degenerative disc disease), lumbar   5. DISH (diffuse idiopathic skeletal hyperostosis)   6. Failed back surgical syndrome    Ms. Cregger has been dealing with the above chronic pain for longer than three months and has either failed to respond, was unable to tolerate, or simply did not get enough benefit from other more conservative therapies including, but not limited to: 1. Over-the-counter medications 2. Anti-inflammatory medications 3. Muscle relaxants 4. Membrane stabilizers 5. Opioids 6. Physical therapy and/or chiropractic manipulation 7. Modalities (Heat, ice, etc.) 8.  Invasive techniques such as nerve blocks. Ms. Branscom has attained more than 50% relief of the pain from a series of diagnostic injections conducted in separate occasions.  Pain Score: Pre-procedure: 7 /10 Post-procedure: 0-No pain/10  Pre-op Assessment:  Ms. Hornung is a 58 y.o. (year old), female patient, seen today for interventional treatment. She  has a past surgical history that includes Cholecystectomy; Cesarean section; Back surgery (2008); and Joint replacement (Right, 04/16/2019). Ms. Gethers has a current medication list which includes the following prescription(s): baclofen, clonazepam, cyclobenzaprine, esomeprazole, latanoprost, magnesium oxide, multivitamin with minerals, mupirocin ointment, oxycodone, pregabalin, rosuvastatin, hydrocodone-acetaminophen, hydrocodone-acetaminophen, and oxycodone, and the following Facility-Administered Medications: fentanyl and midazolam. Her primarily concern today is the Back Pain (lower, right)  Initial Vital Signs:  Pulse/HCG Rate: 85ECG Heart Rate: 94 Temp: 98.8 F (37.1 C) Resp: 16 BP: 131/75 SpO2: 97 %  BMI: Estimated body mass index is 33.66 kg/m as calculated from the following:   Height as of this encounter: 5\' 3"  (1.6 m).   Weight as of this encounter: 190 lb (86.2 kg).  Risk Assessment: Allergies: Reviewed. She is allergic to asa [aspirin]; bupropion; and tape.  Allergy Precautions: None required Coagulopathies: Reviewed. None identified.  Blood-thinner therapy: None at this time Active Infection(s): Reviewed. None identified. Ms. Whitmyer is afebrile  Site Confirmation: Ms. Pierre was asked to confirm the procedure and laterality before marking the site Procedure checklist: Completed Consent: Before the procedure and under the influence of no sedative(s), amnesic(s), or anxiolytics, the patient was informed of the treatment options, risks and possible complications. To fulfill our ethical and legal obligations, as recommended  by the American Medical Association's Code of Ethics, I have informed the patient of my clinical impression; the nature and purpose of  the treatment or procedure; the risks, benefits, and possible complications of the intervention; the alternatives, including doing nothing; the risk(s) and benefit(s) of the alternative treatment(s) or procedure(s); and the risk(s) and benefit(s) of doing nothing. The patient was provided information about the general risks and possible complications associated with the procedure. These may include, but are not limited to: failure to achieve desired goals, infection, bleeding, organ or nerve damage, allergic reactions, paralysis, and death. In addition, the patient was informed of those risks and complications associated to Spine-related procedures, such as failure to decrease pain; infection (i.e.: Meningitis, epidural or intraspinal abscess); bleeding (i.e.: epidural hematoma, subarachnoid hemorrhage, or any other type of intraspinal or peri-dural bleeding); organ or nerve damage (i.e.: Any type of peripheral nerve, nerve root, or spinal cord injury) with subsequent damage to sensory, motor, and/or autonomic systems, resulting in permanent pain, numbness, and/or weakness of one or several areas of the body; allergic reactions; (i.e.: anaphylactic reaction); and/or death. Furthermore, the patient was informed of those risks and complications associated with the medications. These include, but are not limited to: allergic reactions (i.e.: anaphylactic or anaphylactoid reaction(s)); adrenal axis suppression; blood sugar elevation that in diabetics may result in ketoacidosis or comma; water retention that in patients with history of congestive heart failure may result in shortness of breath, pulmonary edema, and decompensation with resultant heart failure; weight gain; swelling or edema; medication-induced neural toxicity; particulate matter embolism and blood vessel occlusion with  resultant organ, and/or nervous system infarction; and/or aseptic necrosis of one or more joints. Finally, the patient was informed that Medicine is not an exact science; therefore, there is also the possibility of unforeseen or unpredictable risks and/or possible complications that may result in a catastrophic outcome. The patient indicated having understood very clearly. We have given the patient no guarantees and we have made no promises. Enough time was given to the patient to ask questions, all of which were answered to the patient's satisfaction. Ms. Iafrate has indicated that she wanted to continue with the procedure. Attestation: I, the ordering provider, attest that I have discussed with the patient the benefits, risks, side-effects, alternatives, likelihood of achieving goals, and potential problems during recovery for the procedure that I have provided informed consent. Date   Time: 07/25/2019 10:32 AM  Pre-Procedure Preparation:  Monitoring: As per clinic protocol. Respiration, ETCO2, SpO2, BP, heart rate and rhythm monitor placed and checked for adequate function Safety Precautions: Patient was assessed for positional comfort and pressure points before starting the procedure. Time-out: I initiated and conducted the "Time-out" before starting the procedure, as per protocol. The patient was asked to participate by confirming the accuracy of the "Time Out" information. Verification of the correct person, site, and procedure were performed and confirmed by me, the nursing staff, and the patient. "Time-out" conducted as per Joint Commission's Universal Protocol (UP.01.01.01). Time: 1137  Description of Procedure:          Laterality: Right Levels:  L2, L3, L4, L5, & S1 Medial Branch Level(s), at the L3-4, L4-5, and the L5-S1 lumbar facet joints. Area Prepped: Lumbosacral Prepping solution: DuraPrep (Iodine Povacrylex [0.7% available iodine] and Isopropyl Alcohol, 74% w/w) Safety Precautions:  Aspiration looking for blood return was conducted prior to all injections. At no point did we inject any substances, as a needle was being advanced. Before injecting, the patient was told to immediately notify me if she was experiencing any new onset of "ringing in the ears, or metallic taste in the mouth". No  attempts were made at seeking any paresthesias. Safe injection practices and needle disposal techniques used. Medications properly checked for expiration dates. SDV (single dose vial) medications used. After the completion of the procedure, all disposable equipment used was discarded in the proper designated medical waste containers. Local Anesthesia: Protocol guidelines were followed. The patient was positioned over the fluoroscopy table. The area was prepped in the usual manner. The time-out was completed. The target area was identified using fluoroscopy. A 12-in long, straight, sterile hemostat was used with fluoroscopic guidance to locate the targets for each level blocked. Once located, the skin was marked with an approved surgical skin marker. Once all sites were marked, the skin (epidermis, dermis, and hypodermis), as well as deeper tissues (fat, connective tissue and muscle) were infiltrated with a small amount of a short-acting local anesthetic, loaded on a 10cc syringe with a 25G, 1.5-in  Needle. An appropriate amount of time was allowed for local anesthetics to take effect before proceeding to the next step. Local Anesthetic: Lidocaine 2.0% The unused portion of the local anesthetic was discarded in the proper designated containers. Technical explanation of process:  Radiofrequency Ablation (RFA) L2 Medial Branch Nerve RFA: The target area for the L2 medial branch is at the junction of the postero-lateral aspect of the superior articular process and the superior, posterior, and medial edge of the transverse process of L3. Under fluoroscopic guidance, a Radiofrequency needle was inserted until  contact was made with os over the superior postero-lateral aspect of the pedicular shadow (target area). Sensory and motor testing was conducted to properly adjust the position of the needle. Once satisfactory placement of the needle was achieved, the numbing solution was slowly injected after negative aspiration for blood. 2.0 mL of the nerve block solution was injected without difficulty or complication. After waiting for at least 3 minutes, the ablation was performed. Once completed, the needle was removed intact. L3 Medial Branch Nerve RFA: The target area for the L3 medial branch is at the junction of the postero-lateral aspect of the superior articular process and the superior, posterior, and medial edge of the transverse process of L4. Under fluoroscopic guidance, a Radiofrequency needle was inserted until contact was made with os over the superior postero-lateral aspect of the pedicular shadow (target area). Sensory and motor testing was conducted to properly adjust the position of the needle. Once satisfactory placement of the needle was achieved, the numbing solution was slowly injected after negative aspiration for blood. 2.0 mL of the nerve block solution was injected without difficulty or complication. After waiting for at least 3 minutes, the ablation was performed. Once completed, the needle was removed intact. L4 Medial Branch Nerve RFA: The target area for the L4 medial branch is at the junction of the postero-lateral aspect of the superior articular process and the superior, posterior, and medial edge of the transverse process of L5. Under fluoroscopic guidance, a Radiofrequency needle was inserted until contact was made with os over the superior postero-lateral aspect of the pedicular shadow (target area). Sensory and motor testing was conducted to properly adjust the position of the needle. Once satisfactory placement of the needle was achieved, the numbing solution was slowly injected after  negative aspiration for blood. 2.0 mL of the nerve block solution was injected without difficulty or complication. After waiting for at least 3 minutes, the ablation was performed. Once completed, the needle was removed intact. L5 Medial Branch Nerve RFA: The target area for the L5 medial branch is at the  junction of the postero-lateral aspect of the superior articular process of S1 and the superior, posterior, and medial edge of the sacral ala. Under fluoroscopic guidance, a Radiofrequency needle was inserted until contact was made with os over the superior postero-lateral aspect of the pedicular shadow (target area). Sensory and motor testing was conducted to properly adjust the position of the needle. Once satisfactory placement of the needle was achieved, the numbing solution was slowly injected after negative aspiration for blood. 2.0 mL of the nerve block solution was injected without difficulty or complication. After waiting for at least 3 minutes, the ablation was performed. Once completed, the needle was removed intact. S1 Medial Branch Nerve RFA: The target area for the S1 medial branch is located inferior to the junction of the S1 superior articular process and the L5 inferior articular process, posterior, inferior, and lateral to the 6 o'clock position of the L5-S1 facet joint, just superior to the S1 posterior foramen. Under fluoroscopic guidance, the Radiofrequency needle was advanced until contact was made with os over the Target area. Sensory and motor testing was conducted to properly adjust the position of the needle. Once satisfactory placement of the needle was achieved, the numbing solution was slowly injected after negative aspiration for blood. 2.0 mL of the nerve block solution was injected without difficulty or complication. After waiting for at least 3 minutes, the ablation was performed. Once completed, the needle was removed intact. Radiofrequency lesioning (ablation):  Radiofrequency  Generator: NeuroTherm [id number] Sensory Stimulation Parameters: 50 Hz was used to locate & identify the nerve, making sure that the needle was positioned such that there was no sensory stimulation below 0.3 V or above 0.7 V. Motor Stimulation Parameters: 2 Hz was used to evaluate the motor component. Care was taken not to lesion any nerves that demonstrated motor stimulation of the lower extremities at an output of less than 2.5 times that of the sensory threshold, or a maximum of 2.0 V. Lesioning Technique Parameters: Standard Radiofrequency settings. (Not bipolar or pulsed.) Temperature Settings: 80 degrees C Lesioning time: 60 seconds Intra-operative Compliance: Compliant Materials & Medications: Needle(s) (Electrode/Cannula) Type: Teflon-coated, curved tip, Radiofrequency needle(s) Gauge: 22G Length: 10cm Numbing solution: 0.2% PF-Ropivacaine + Triamcinolone (40 mg/mL) diluted to a final concentration of 4 mg of Triamcinolone/mL of Ropivacaine The unused portion of the solution was discarded in the proper designated containers.  Once the entire procedure was completed, the treated area was cleaned, making sure to leave some of the prepping solution back to take advantage of its long term bactericidal properties.  Illustration of the posterior view of the lumbar spine and the posterior neural structures. Laminae of L2 through S1 are labeled. DPRL5, dorsal primary ramus of L5; DPRS1, dorsal primary ramus of S1; DPR3, dorsal primary ramus of L3; FJ, facet (zygapophyseal) joint L3-L4; I, inferior articular process of L4; LB1, lateral branch of dorsal primary ramus of L1; IAB, inferior articular branches from L3 medial branch (supplies L4-L5 facet joint); IBP, intermediate branch plexus; MB3, medial branch of dorsal primary ramus of L3; NR3, third lumbar nerve root; S, superior articular process of L5; SAB, superior articular branches from L4 (supplies L4-5 facet joint also); TP3, transverse process of  L3.  Vitals:   07/25/19 1214 07/25/19 1219 07/25/19 1225 07/25/19 1236  BP: (!) 142/103 (!) 161/79 139/81 134/74  Pulse:      Resp: 16 19 14 15   Temp: 97.8 F (36.6 C)   (!) 97.3 F (36.3 C)  TempSrc: Temporal  Temporal  SpO2: 92% 98% 95% 96%  Weight:      Height:        Start Time: 1137 hrs. End Time: 1208 hrs.  Imaging Guidance (Spinal):          Type of Imaging Technique: Fluoroscopy Guidance (Spinal) Indication(s): Assistance in needle guidance and placement for procedures requiring needle placement in or near specific anatomical locations not easily accessible without such assistance. Exposure Time: Please see nurses notes. Contrast: None used. Fluoroscopic Guidance: I was personally present during the use of fluoroscopy. "Tunnel Vision Technique" used to obtain the best possible view of the target area. Parallax error corrected before commencing the procedure. "Direction-depth-direction" technique used to introduce the needle under continuous pulsed fluoroscopy. Once target was reached, antero-posterior, oblique, and lateral fluoroscopic projection used confirm needle placement in all planes. Images permanently stored in EMR. Interpretation: No contrast injected. I personally interpreted the imaging intraoperatively. Adequate needle placement confirmed in multiple planes. Permanent images saved into the patient's record.  Antibiotic Prophylaxis:   Anti-infectives (From admission, onward)   None     Indication(s): None identified  Post-operative Assessment:  Post-procedure Vital Signs:  Pulse/HCG Rate: 9187 Temp: (!) 97.3 F (36.3 C) Resp: 15 BP: 134/74 SpO2: 96 %  EBL: None  Complications: No immediate post-treatment complications observed by team, or reported by patient.  Note: The patient tolerated the entire procedure well. A repeat set of vitals were taken after the procedure and the patient was kept under observation following institutional policy, for this  type of procedure. Post-procedural neurological assessment was performed, showing return to baseline, prior to discharge. The patient was provided with post-procedure discharge instructions, including a section on how to identify potential problems. Should any problems arise concerning this procedure, the patient was given instructions to immediately contact us, at any time, without hesitation. In any case, we plan to contact the patient by telephone for a follow-up status report regarding this interventional procedure.  Comments:  No additional relevant information.  Plan of Care  Orders:  Orders Placed This Encounter  Procedures   Radiofrequency,Lumbar    Scheduling Instructions:     Side(s): Right-sided     Level: L3-4, L4-5, & L5-S1 Facets (L2, L3, L4, L5, & S1 Medial Branch Nerves)     Sedation: With Sedation     Timeframe: Today    Order Specific Question:   Where will this procedure be performed?    Answer:   [facility]   Radiofrequency,Lumbar    Standing Status:   Future    Standing Expiration Date:   01/21/2021    Scheduling Instructions:     Side(s): Left-sided     Level: L3-4, L4-5, & L5-S1 Facets (L2, L3, L4, L5, & S1 Medial Branch Nerves)     Sedation: With Sedation     Scheduling Timeframe: 2 weeks from now    Order Specific Question:   Where will this procedure be performed?    Answer:   [facility]   DG PAIN CL[facility] C-ARM 1-60 MIN NO REPORT    Intraoperative interpretation by procedural physician at [place].    Standing Status:   Standing    Number of Occurrences:   1    Order Specific Question:   Reason for exam:    Answer:   Assistance in needle guidance and placement for procedures requiring needle placement in or near specific anatomical locations not easily accessible without such assistance.   Follow-up  Special Instructions: None    Scheduling Instructions:     Schedule follow-up visit.     Type: Face-to-face (F2F)  Procedure (see procedure orders)     Timeframe: 2 weeks   Informed Consent Details: Physician/Practitioner Attestation; Transcribe to consent form and obtain patient signature    Surgeon/Provider: Travontae Freiberger A. Laban Emperor, MD (Pain Management Specialist) Attestation: I, Duward Allbritton A. Laban Emperor, MD, the physician/practitioner, attest that I have discussed with the patient the benefits, risks, side effects, alternatives, likelihood of achieving goals and potential problems during recovery for the procedure that I have provided informed consent.    Scheduling Instructions:     Procedure: Lumbar Facet Radiofrequency Ablation     Indication/Reason: Low Back Pain, with our without leg pain, due to Facet Joint Arthralgia (Joint Pain) known as Lumbar Facet Syndrome, secondary to Lumbar, and/or Lumbosacral Spondylosis (Arthritis of the Spine), without myelopathy or radiculopathy (Nerve Damage).     Note: Always confirm laterality of pain with Ms. Castronovo, before procedure.   Chronic Opioid Analgesic:  Oxycodone IR 5 mg, 1 tab PO q 8 hrs (15 mg/day of oxycodone) MME/day: 22.5 mg/day.   Medications ordered for procedure: Meds ordered this encounter  Medications   lidocaine (XYLOCAINE) 2 % (with pres) injection 400 mg   lactated ringers infusion 1,000 mL   midazolam (VERSED) 5 MG/5ML injection 1-2 mg    Make sure Flumazenil is available in the pyxis when using this medication. If oversedation occurs, administer 0.2 mg IV over 15 sec. If after 45 sec no response, administer 0.2 mg again over 1 min; may repeat at 1 min intervals; not to exceed 4 doses (1 mg)   fentaNYL (SUBLIMAZE) injection 25-50 mcg    Make sure Narcan is available in the pyxis when using this medication. In the event of respiratory depression (RR< 8/min): Titrate NARCAN (naloxone) in increments of 0.1 to 0.2 mg IV at 2-3 minute intervals, until desired degree of reversal.   ropivacaine (PF) 2 mg/mL (0.2%) (NAROPIN) injection 9 mL    triamcinolone acetonide (KENALOG-40) injection 40 mg   oxyCODONE (OXY IR/ROXICODONE) 5 MG immediate release tablet    Sig: Take 1 tablet (5 mg total) by mouth every 8 (eight) hours as needed for severe pain. Must last 30 days. Max: 3/day.    Dispense:  90 tablet    Refill:  0    Chronic Pain: STOP Act (Not applicable) Fill 1 day early if closed on refill date. Do not fill until: 08/19/2019. To last until: 09/18/2019. Avoid benzodiazepines within 8 hours of opioids   HYDROcodone-acetaminophen (NORCO/VICODIN) 5-325 MG tablet    Sig: Take 1 tablet by mouth every 8 (eight) hours as needed for up to 7 days for severe pain. Must last 7 days.    Dispense:  21 tablet    Refill:  0    For acute post-operative pain. Not to be refilled. Must last 7 days.   HYDROcodone-acetaminophen (NORCO/VICODIN) 5-325 MG tablet    Sig: Take 1 tablet by mouth every 8 (eight) hours as needed for up to 7 days for severe pain. Must last for 7 days.    Dispense:  21 tablet    Refill:  0    For acute post-operative pain. Not to be refilled. Must last 7 days.   Medications administered: We administered lidocaine, lactated ringers, midazolam, fentaNYL, ropivacaine (PF) 2 mg/mL (0.2%), and triamcinolone acetonide.  See the medical record for exact dosing, route, and time of administration.  Follow-up plan:   Return  in about 2 weeks (around 08/08/2019) for RFA (w/ sedation): (L) L-FCT RFA.       Interventional management options: Considering:   Diagnostic Trigger point injections Diagnostic bilateral LESI Possiblebilateral Racz(Epidural Neurolysis) Diagnostic thoracic facet nerve block Possible thoracic facet RFA Diagnostic bilateral CESI Diagnosticbilateral cervical facet nerve block Possiblebilateral cervical facet RFA Diagnostic right shoulder intra-articular injection Diagnostic right suprascapular nerve block Possibleright suprascapular RFA Diagnostic bilateral genicular nerve block Possible  bilateral genicular RFA   Palliative PRN treatment(s):   Palliative right-sided T6-7 TESI #2  Palliative bilateral lumbar facet block #4  Palliative right-sided sacroiliac joint block #3  Palliative right-sided intra-articular hip joint injections  Palliative right-sided lumbar facet RFA #2 (last done on 05/03/2018)  Palliative right-sided sacroiliac joint RFA #2 (last done on 05/03/2018)  Palliative left-sided lumbar facet RFA #2 (last done 08/16/2018)  Palliative right-sided genicular nerve block #2        Recent Visits Date Type Provider Dept  06/27/19 Office Visit Delano MetzNaveira, Dinesha Twiggs, MD [facility]  05/30/19 Procedure visit Delano MetzNaveira, Jencarlo Bonadonna, MD [facility]  05/20/19 Office Visit Delano MetzNaveira, Leslieanne Cobarrubias, MD [facility]  Showing recent visits within past 90 days and meeting all other requirements   Today's Visits Date Type Provider Dept  07/25/19 Procedure visit Delano MetzNaveira, Duvall Comes, MD [facility]  Showing today's visits and meeting all other requirements   Future Appointments Date Type Provider Dept  07/31/19 Appointment Delano MetzNaveira, Jarion Hawthorne, MD [facility]  08/15/19 Appointment Delano MetzNaveira, Ascencion Coye, MD [facility]  Showing future appointments within next 90 days and meeting all other requirements   Disposition: Discharge home  Discharge Date & Time: 07/25/2019; 1242 hrs.   Primary Care Physician: Ignatius SpeckingVyas, Dhruv B, MD [facility] Note by: Oswaldo DoneFrancisco A Kotaro Buer, MD Date: 07/25/2019; Time: 12:54 PM  Disclaimer:  Medicine is not an Visual merchandiserexact science. The only guarantee in medicine is that nothing is guaranteed. It is important to note that the decision to proceed with this intervention was based on the information collected from the patient. The Data and conclusions were drawn from the patient's questionnaire, the interview, and the physical examination. Because the information was provided in large  part by the patient, it cannot be guaranteed that it has not been purposely or unconsciously manipulated. Every effort has been made to obtain as much relevant data as possible for this evaluation. It is important to note that the conclusions that lead to this procedure are derived in large part from the available data. Always take into account that the treatment will also be dependent on availability of resources and existing treatment guidelines, considered by other Pain Management Practitioners as being common knowledge and practice, at the time of the intervention. For Medico-Legal purposes, it is also important to point out that variation in procedural techniques and pharmacological choices are the acceptable norm. The indications, contraindications, technique, and results of the above procedure should only be interpreted and judged by a Board-Certified Interventional Pain Specialist with extensive familiarity and expertise in the same exact procedure and technique.

## 2019-07-25 NOTE — Patient Instructions (Signed)

## 2019-07-25 NOTE — Progress Notes (Signed)
Safety precautions to be maintained throughout the outpatient stay will include: orient to surroundings, keep bed in low position, maintain call bell within reach at all times, provide assistance with transfer out of bed and ambulation.  

## 2019-07-26 ENCOUNTER — Telehealth: Payer: Self-pay

## 2019-07-26 NOTE — Telephone Encounter (Signed)
Denies any needs at this time. Instructed to call if needed. 

## 2019-07-29 DIAGNOSIS — G4733 Obstructive sleep apnea (adult) (pediatric): Secondary | ICD-10-CM | POA: Diagnosis not present

## 2019-07-31 ENCOUNTER — Ambulatory Visit: Payer: PPO | Attending: Pain Medicine | Admitting: Pain Medicine

## 2019-07-31 ENCOUNTER — Other Ambulatory Visit: Payer: Self-pay

## 2019-07-31 ENCOUNTER — Encounter: Payer: Self-pay | Admitting: Pain Medicine

## 2019-07-31 DIAGNOSIS — Z471 Aftercare following joint replacement surgery: Secondary | ICD-10-CM | POA: Diagnosis not present

## 2019-07-31 DIAGNOSIS — Z96651 Presence of right artificial knee joint: Secondary | ICD-10-CM | POA: Diagnosis not present

## 2019-07-31 NOTE — Progress Notes (Deleted)
NO contact

## 2019-07-31 NOTE — Progress Notes (Signed)
Unsuccessful attempt to contact patient for Virtual Visit (Pain Management Telehealth)   Patient provided contact information:  [phone] (home); [phone] (mobile); (Preferred) [phone] No e-mail address on record   Pre-screening:  Our staff was unsuccessful in contacting Ms. Essner using the above provided information.   I unsuccessfully attempted to make contact with [name] on 07/31/2019 via telephone. I was unable to complete the virtual encounter due to no one answering the phone. I was able to leave a message where I clearly identify myself as Gaspar Cola, MD and I left a message to call us back to reschedule the call.  Post-Procedure Evaluation  Procedure: Therapeutic right-sided lumbar facet RFA #1 under fluoroscopic guidance and IV sedation Pre-procedure pain level:  7/10 Post-procedure: 0/10 (100% relief)  Sedation: Sedation provided.  Effectiveness during initial hour after procedure(Ultra-Short Term Relief): 100 %   Local anesthetic used: Long-acting (4-6 hours) Effectiveness: Defined as any analgesic benefit obtained secondary to the administration of local anesthetics. This carries significant diagnostic value as to the etiological location, or anatomical origin, of the pain. Duration of benefit is expected to coincide with the duration of the local anesthetic used.  Effectiveness during initial 4-6 hours after procedure(Short-Term Relief): 100 %   Long-term benefit: Defined as any relief past the pharmacologic duration of the local anesthetics.  Effectiveness past the initial 6 hours after procedure(Long-Term Relief): 100 %(right buttock)   Pharmacotherapy Assessment  Analgesic: Oxycodone IR 5 mg, 1 tab PO q 8 hrs (15 mg/day of oxycodone) MME/day: 22.5 mg/day.   Monitoring: Pharmacotherapy: No side-effects or adverse reactions reported. [place] PMP: PDMP reviewed during this encounter.       Compliance: No problems identified. Effectiveness:  Clinically acceptable. Plan: Refer to "POC".  UDS:  Summary  Date Value Ref Range Status  12/24/2018 FINAL  Final    Comment:    ==================================================================== TOXASSURE SELECT 13 (MW) ==================================================================== Test                             Result       Flag       Units Drug Present and Declared for Prescription Verification   7-aminoclonazepam              315          EXPECTED   ng/mg creat    7-aminoclonazepam is an expected metabolite of clonazepam. Source    of clonazepam is a scheduled prescription medication.   Oxycodone                      221          EXPECTED   ng/mg creat   Oxymorphone                    247          EXPECTED   ng/mg creat   Noroxycodone                   573          EXPECTED   ng/mg creat   Noroxymorphone                 111          EXPECTED   ng/mg creat    Sources of oxycodone are scheduled prescription medications.    Oxymorphone, noroxycodone, and noroxymorphone are expected    metabolites of  oxycodone. Oxymorphone is also available as a    scheduled prescription medication. ==================================================================== Test                      Result    Flag   Units      Ref Range   Creatinine              112              mg/dL      >=67 ==================================================================== Declared Medications:  The flagging and interpretation on this report are based on the  following declared medications.  Unexpected results may arise from  inaccuracies in the declared medications.  **Note: The testing scope of this panel includes these medications:  Clonazepam  Oxycodone  **Note: The testing scope of this panel does not include following  reported medications:  Citalopram  Cyclobenzaprine  Latanoprost  Magnesium Oxide  Multivitamin  Pregabalin  Ranitidine   Rosuvastatin ==================================================================== For clinical consultation, please call [phone]. ====================================================================     Follow-up plan:   No follow-ups on file.      Interventional management options: Considering:   Diagnostic Trigger point injections Diagnostic bilateral LESI Possiblebilateral Racz(Epidural Neurolysis) Diagnostic thoracic facet nerve block Possible thoracic facet RFA Diagnostic bilateral CESI Diagnosticbilateral cervical facet nerve block Possiblebilateral cervical facet RFA Diagnostic right shoulder intra-articular injection Diagnostic right suprascapular nerve block Possibleright suprascapular RFA Diagnostic bilateral genicular nerve block Possible bilateral genicular RFA   Palliative PRN treatment(s):   Palliative right-sided T6-7 TESI #2  Palliative bilateral lumbar facet block #4  Palliative right-sided sacroiliac joint block #3  Palliative right-sided intra-articular hip joint injections  Palliative right-sided lumbar facet RFA #2 (last done on 05/03/2018)  Palliative right-sided sacroiliac joint RFA #2 (last done on 05/03/2018)  Palliative left-sided lumbar facet RFA #2 (last done 08/16/2018)  Palliative right-sided genicular nerve block #2         Recent Visits Date Type Provider Dept  07/25/19 Procedure visit Delano Metz, MD [facility]  06/27/19 Office Visit Delano Metz, MD [facility]  05/30/19 Procedure visit Delano Metz, MD [facility]  05/20/19 Office Visit Delano Metz, MD [facility]  Showing recent visits within past 90 days and meeting all other requirements   Today's Visits Date Type Provider Dept  07/31/19 Office Visit Delano Metz, MD [facility]  Showing today's visits and meeting all other requirements   Future Appointments Date Type Provider  Dept  08/15/19 Appointment Delano Metz, MD [facility]  Showing future appointments within next 90 days and meeting all other requirements    Note by: Oswaldo Done, MD Date: 07/31/2019; Time: 11:09 AM

## 2019-08-12 DIAGNOSIS — Z7189 Other specified counseling: Secondary | ICD-10-CM | POA: Diagnosis not present

## 2019-08-12 DIAGNOSIS — Z79899 Other long term (current) drug therapy: Secondary | ICD-10-CM | POA: Diagnosis not present

## 2019-08-12 DIAGNOSIS — Z0000 Encounter for general adult medical examination without abnormal findings: Secondary | ICD-10-CM | POA: Diagnosis not present

## 2019-08-12 DIAGNOSIS — R5383 Other fatigue: Secondary | ICD-10-CM | POA: Diagnosis not present

## 2019-08-12 DIAGNOSIS — E7800 Pure hypercholesterolemia, unspecified: Secondary | ICD-10-CM | POA: Diagnosis not present

## 2019-08-12 DIAGNOSIS — Z1331 Encounter for screening for depression: Secondary | ICD-10-CM | POA: Diagnosis not present

## 2019-08-12 DIAGNOSIS — Z299 Encounter for prophylactic measures, unspecified: Secondary | ICD-10-CM | POA: Diagnosis not present

## 2019-08-12 DIAGNOSIS — Z23 Encounter for immunization: Secondary | ICD-10-CM | POA: Diagnosis not present

## 2019-08-12 DIAGNOSIS — Z1211 Encounter for screening for malignant neoplasm of colon: Secondary | ICD-10-CM | POA: Diagnosis not present

## 2019-08-12 DIAGNOSIS — I10 Essential (primary) hypertension: Secondary | ICD-10-CM | POA: Diagnosis not present

## 2019-08-12 DIAGNOSIS — Z6833 Body mass index (BMI) 33.0-33.9, adult: Secondary | ICD-10-CM | POA: Diagnosis not present

## 2019-08-12 DIAGNOSIS — Z1339 Encounter for screening examination for other mental health and behavioral disorders: Secondary | ICD-10-CM | POA: Diagnosis not present

## 2019-08-12 DIAGNOSIS — E559 Vitamin D deficiency, unspecified: Secondary | ICD-10-CM | POA: Diagnosis not present

## 2019-08-15 ENCOUNTER — Ambulatory Visit: Payer: PPO | Admitting: Pain Medicine

## 2019-08-26 ENCOUNTER — Telehealth: Payer: Self-pay | Admitting: Pain Medicine

## 2019-08-26 NOTE — Telephone Encounter (Signed)
Ok looked into this further and discovered patient had talk to Dr. Dossie Arbour. He sent scripts for Sept and Oct. When I talked to patient she was going to call her pharmacy and if she did not call back that meant she had scripts to pick up. I did move her med refill appt to Nov so she will not run out of meds.

## 2019-08-26 NOTE — Telephone Encounter (Signed)
Patient lvmail at 12:01 stating she is having a lot of severe back pain and would like to see if Dr. Dossie Arbour would call her in a pain medicine.

## 2019-08-26 NOTE — Telephone Encounter (Signed)
Had "no show" for last MR appt. Meds are due now. Needs VV. Juliann Pulse will schedule.

## 2019-08-29 ENCOUNTER — Telehealth: Payer: Self-pay | Admitting: Pain Medicine

## 2019-08-29 NOTE — Telephone Encounter (Signed)
Jennifer Harmon, I might be wrong but it looks like she should have had a script to last until 09-16-19 as that is when he told us to make her med refill date. Would you mind just asking him about it?

## 2019-08-29 NOTE — Telephone Encounter (Signed)
Patient states she is out of cyclobenzaprine. Has procedure on 09-10-19 and med mgmt 09-16-19. Please call patient and let her know if she can get this called in

## 2019-08-29 NOTE — Telephone Encounter (Signed)
She only got enough Flexeril to last until 08-20-19.

## 2019-08-29 NOTE — Telephone Encounter (Signed)
error 

## 2019-08-29 NOTE — Telephone Encounter (Signed)
Informed patient per voicemail that no meds are prescribed outside of appointment.

## 2019-09-10 ENCOUNTER — Encounter: Payer: Self-pay | Admitting: Pain Medicine

## 2019-09-10 ENCOUNTER — Other Ambulatory Visit: Payer: Self-pay

## 2019-09-10 ENCOUNTER — Ambulatory Visit (HOSPITAL_BASED_OUTPATIENT_CLINIC_OR_DEPARTMENT_OTHER): Payer: PPO | Admitting: Pain Medicine

## 2019-09-10 ENCOUNTER — Ambulatory Visit
Admission: RE | Admit: 2019-09-10 | Discharge: 2019-09-10 | Disposition: A | Discharge status: Home / Self Care | Payer: PPO | Source: Ambulatory Visit | Attending: Pain Medicine | Admitting: Pain Medicine

## 2019-09-10 VITALS — BP 116/71 | HR 97 | Temp 97.3°F | Resp 19 | Ht 63.0 in | Wt 196.0 lb

## 2019-09-10 DIAGNOSIS — M5441 Lumbago with sciatica, right side: Secondary | ICD-10-CM

## 2019-09-10 DIAGNOSIS — M7918 Myalgia, other site: Secondary | ICD-10-CM | POA: Insufficient documentation

## 2019-09-10 DIAGNOSIS — M792 Neuralgia and neuritis, unspecified: Secondary | ICD-10-CM

## 2019-09-10 DIAGNOSIS — G894 Chronic pain syndrome: Secondary | ICD-10-CM | POA: Diagnosis not present

## 2019-09-10 DIAGNOSIS — G8929 Other chronic pain: Secondary | ICD-10-CM | POA: Diagnosis not present

## 2019-09-10 DIAGNOSIS — M47816 Spondylosis without myelopathy or radiculopathy, lumbar region: Secondary | ICD-10-CM | POA: Insufficient documentation

## 2019-09-10 DIAGNOSIS — M62838 Other muscle spasm: Secondary | ICD-10-CM

## 2019-09-10 DIAGNOSIS — M4810 Ankylosing hyperostosis [Forestier], site unspecified: Secondary | ICD-10-CM | POA: Insufficient documentation

## 2019-09-10 DIAGNOSIS — M797 Fibromyalgia: Secondary | ICD-10-CM | POA: Diagnosis not present

## 2019-09-10 DIAGNOSIS — T39395A Adverse effect of other nonsteroidal anti-inflammatory drugs [NSAID], initial encounter: Secondary | ICD-10-CM | POA: Diagnosis not present

## 2019-09-10 DIAGNOSIS — K219 Gastro-esophageal reflux disease without esophagitis: Secondary | ICD-10-CM

## 2019-09-10 DIAGNOSIS — G8918 Other acute postprocedural pain: Secondary | ICD-10-CM | POA: Diagnosis not present

## 2019-09-10 DIAGNOSIS — M47817 Spondylosis without myelopathy or radiculopathy, lumbosacral region: Secondary | ICD-10-CM | POA: Diagnosis not present

## 2019-09-10 DIAGNOSIS — R252 Cramp and spasm: Secondary | ICD-10-CM | POA: Diagnosis not present

## 2019-09-10 DIAGNOSIS — K2960 Other gastritis without bleeding: Secondary | ICD-10-CM | POA: Insufficient documentation

## 2019-09-10 DIAGNOSIS — M5136 Other intervertebral disc degeneration, lumbar region: Secondary | ICD-10-CM | POA: Insufficient documentation

## 2019-09-10 DIAGNOSIS — M5442 Lumbago with sciatica, left side: Secondary | ICD-10-CM | POA: Diagnosis not present

## 2019-09-10 MED ORDER — ESOMEPRAZOLE MAGNESIUM 40 MG PO CPDR: 40.0000 mg | DELAYED_RELEASE_CAPSULE | Freq: Every day | ORAL | 5 refills | Status: DC

## 2019-09-10 MED ORDER — LIDOCAINE HCL 2 % IJ SOLN
20.0000 mL | Freq: Once | INTRAMUSCULAR | Status: AC
  Administered 2019-09-10: 400 mg
  Filled 2019-09-10: qty 20

## 2019-09-10 MED ORDER — FENTANYL CITRATE (PF) 100 MCG/2ML IJ SOLN
25.0000 ug | INTRAMUSCULAR | Status: DC | PRN
  Administered 2019-09-10: 25 ug via INTRAVENOUS
  Filled 2019-09-10: qty 2

## 2019-09-10 MED ORDER — BACLOFEN 10 MG PO TABS: 10.0000 mg | ORAL_TABLET | Freq: Every day | ORAL | 5 refills | Status: DC

## 2019-09-10 MED ORDER — OXYCODONE HCL 5 MG PO TABS: 5.0000 mg | ORAL_TABLET | Freq: Three times a day (TID) | ORAL | 0 refills | Status: DC | PRN

## 2019-09-10 MED ORDER — MAGNESIUM OXIDE -MG SUPPLEMENT 500 MG PO CAPS: 1.0000 | ORAL_CAPSULE | Freq: Every day | ORAL | 11 refills | Status: DC

## 2019-09-10 MED ORDER — HYDROCODONE-ACETAMINOPHEN 5-325 MG PO TABS: 1.0000 | ORAL_TABLET | Freq: Three times a day (TID) | ORAL | 0 refills | Status: AC | PRN

## 2019-09-10 MED ORDER — ROPIVACAINE HCL 2 MG/ML IJ SOLN
9.0000 mL | Freq: Once | INTRAMUSCULAR | Status: AC
  Administered 2019-09-10: 9 mL via PERINEURAL
  Filled 2019-09-10: qty 10

## 2019-09-10 MED ORDER — MIDAZOLAM HCL 5 MG/5ML IJ SOLN
1.0000 mg | INTRAMUSCULAR | Status: DC | PRN
  Administered 2019-09-10: 1 mg via INTRAVENOUS
  Filled 2019-09-10: qty 5

## 2019-09-10 MED ORDER — LACTATED RINGERS IV SOLN
1000.0000 mL | Freq: Once | INTRAVENOUS | Status: AC
  Administered 2019-09-10: 1000 mL via INTRAVENOUS

## 2019-09-10 MED ORDER — CYCLOBENZAPRINE HCL 10 MG PO TABS: 10.0000 mg | ORAL_TABLET | Freq: Three times a day (TID) | ORAL | 5 refills | Status: DC

## 2019-09-10 MED ORDER — TRIAMCINOLONE ACETONIDE 40 MG/ML IJ SUSP
40.0000 mg | Freq: Once | INTRAMUSCULAR | Status: AC
  Administered 2019-09-10: 40 mg
  Filled 2019-09-10: qty 1

## 2019-09-10 MED ORDER — PREGABALIN 150 MG PO CAPS: 150.0000 mg | ORAL_CAPSULE | Freq: Three times a day (TID) | ORAL | 5 refills | Status: DC | PRN

## 2019-09-10 NOTE — Progress Notes (Signed)
Safety precautions to be maintained throughout the outpatient stay will include: orient to surroundings, keep bed in low position, maintain call bell within reach at all times, provide assistance with transfer out of bed and ambulation.  

## 2019-09-10 NOTE — Patient Instructions (Addendum)
___________________________________________________________________________________________  Post-Radiofrequency (RF) Discharge Instructions  You have just completed a Radiofrequency Neurotomy.  The following instructions will provide you with information and guidelines for self-care upon discharge.  If at any time you have questions or concerns please call your physician. DO NOT DRIVE YOURSELF!!  Instructions:  Apply ice: Fill a plastic sandwich bag with crushed ice. Cover it with a small towel and apply to injection site. Apply for 15 minutes then remove x 15 minutes. Repeat sequence on day of procedure, until you go to bed. The purpose is to minimize swelling and discomfort after procedure.  Apply heat: Apply heat to procedure site starting the day following the procedure. The purpose is to treat any soreness and discomfort from the procedure.  Food intake: No eating limitations, unless stipulated above.  Nevertheless, if you have had sedation, you may experience some nausea.  In this case, it may be wise to wait at least two hours prior to resuming regular diet.  Physical activities: Keep activities to a minimum for the first 8 hours after the procedure. For the first 24 hours after the procedure, do not drive a motor vehicle,  Operate heavy machinery, power tools, or handle any weapons.  Consider walking with the use of an assistive device or accompanied by an adult for the first 24 hours.  Do not drink alcoholic beverages including beer.  Do not make any important decisions or sign any legal documents. Go home and rest today.  Resume activities tomorrow, as tolerated.  Use caution in moving about as you may experience mild leg weakness.  Use caution in cooking, use of household electrical appliances and climbing steps.  Driving: If you have received any sedation, you are not allowed to drive for 24 hours after your procedure.  Blood thinner: Restart your blood thinner 6 hours after your  procedure. (Only for those taking blood thinners)  Insulin: As soon as you can eat, you may resume your normal dosing schedule. (Only for those taking insulin)  Medications: May resume pre-procedure medications.  Do not take any drugs, other than what has been prescribed to you.  Infection prevention: Keep procedure site clean and dry.  Post-procedure Pain Diary: Extremely important that this be done correctly and accurately. Recorded information will be used to determine the next step in treatment.  Pain evaluated is that of treated area only. Do not include pain from an untreated area.  Complete every hour, on the hour, for the initial 8 hours. Set an alarm to help you do this part accurately.  Do not go to sleep and have it completed later. It will not be accurate.  Follow-up appointment: Keep your follow-up appointment after the procedure. Usually 2 weeks for most procedures. (6 weeks in the case of radiofrequency.) Bring you pain diary.   Expect:  From numbing medicine (AKA: Local Anesthetics): Numbness or decrease in pain.  Onset: Full effect within 15 minutes of injected.  Duration: It will depend on the type of local anesthetic used. On the average, 1 to 8 hours.   From steroids: Decrease in swelling or inflammation. Once inflammation is improved, relief of the pain will follow.  Onset of benefits: Depends on the amount of swelling present. The more swelling, the longer it will take for the benefits to be seen. In some cases, up to 10 days.  Duration: Steroids will stay in the system x 2 weeks. Duration of benefits will depend on multiple posibilities including persistent irritating factors.  From procedure: Some   discomfort is to be expected once the numbing medicine wears off. This should be minimal if ice and heat are applied as instructed.  Call if:  You experience numbness and weakness that gets worse with time, as opposed to wearing off.  He experience any unusual  bleeding, difficulty breathing, or loss of the ability to control your bowel and bladder. (This applies to Spinal procedures only)  You experience any redness, swelling, heat, red streaks, elevated temperature, fever, or any other signs of a possible infection.  Emergency Numbers:  Durning business hours (Monday - Thursday, 8:00 AM - 4:00 PM) (Friday, 9:00 AM - 12:00 Noon): [phone]  After hours: [phone] ____________________________________________________________________________________________   ____________________________________________________________________________________________  Muscle Spasms & Cramps  Cause:  The most common cause of muscle spasms and cramps is vitamin and/or electrolyte (calcium, potassium, sodium, etc.) deficiencies.  Possible triggers: Sweating - causes loss of electrolytes thru the skin. Steroids - causes loss of electrolytes thru the urine.  Treatment: 1. Gatorade (or any other electrolyte-replenishing drink) - Take 1, 8 oz glass with each meal (3 times a day). 2. OTC (over-the-counter) Magnesium 400 to 500 mg - Take 1 tablet twice a day (one with breakfast and one before bedtime). If you have kidney problems, talk to your primary care physician before taking any Magnesium. 3. Tonic Water with quinine - Take 1, 8 oz glass before bedtime.   ____________________________________________________________________________________________    

## 2019-09-10 NOTE — Progress Notes (Signed)
Patient's Name: Jennifer Harmon  MRN: [id number]  Referring Provider: Glenda Chroman, MD  DOB: 01-18-1961  PCP: Glenda Chroman, MD  DOS: 09/10/2019  Note by: Gaspar Cola, MD  Service setting: Ambulatory outpatient  Specialty: Interventional Pain Management  Patient type: Established  Location: [facility] (AMB) Pain Management Facility  Visit type: Interventional Procedure   Primary Reason for Visit: Interventional Pain Management Treatment. CC: Back Pain (lower)  Procedure:          Anesthesia, Analgesia, Anxiolysis:  Type: Thermal Lumbar Facet, Medial Branch Radiofrequency Ablation/Neurotomy  #1  Primary Purpose: Therapeutic Region: Posterolateral Lumbosacral Spine Level: L2, L3, L4, L5, & S1 Medial Branch Level(s). These levels will denervate the L3-4, L4-5, and the L5-S1 lumbar facet joints. Laterality: Left  Type: Moderate (Conscious) Sedation combined with Local Anesthesia Indication(s): Analgesia and Anxiety Route: Intravenous (IV) IV Access: Secured Sedation: Meaningful verbal contact was maintained at all times during the procedure  Local Anesthetic: Lidocaine 1-2%  Position: Prone   Indications: 1. Lumbar facet syndrome (Bilateral) (R>L)   2. Spondylosis without myelopathy or radiculopathy, lumbosacral region   3. DDD (degenerative disc disease), lumbar   4. DISH (diffuse idiopathic skeletal hyperostosis)   5. Chronic low back pain  (Secondary Area of Pain) (Bilateral) (R>L)    [name] has been dealing with the above chronic pain for longer than three months and has either failed to respond, was unable to tolerate, or simply did not get enough benefit from other more conservative therapies including, but not limited to: 1. Over-the-counter medications 2. Anti-inflammatory medications 3. Muscle relaxants 4. Membrane stabilizers 5. Opioids 6. Physical therapy and/or chiropractic manipulation 7. Modalities (Heat, ice, etc.) 8. Invasive techniques such as nerve  blocks. [name] has attained more than 50% relief of the pain from a series of diagnostic injections conducted in separate occasions.  Pain Score: Pre-procedure: 7 /10 Post-procedure: 3 /10  Pre-op Assessment:  [name] is a 58 y.o. (year old), female patient, seen today for interventional treatment. She  has a past surgical history that includes Cholecystectomy; Cesarean section; Back surgery (2008); and Joint replacement (Right, 04/16/2019). [name] has a current medication list which includes the following prescription(s): clonazepam, latanoprost, multivitamin with minerals, mupirocin ointment, rosuvastatin, baclofen, cyclobenzaprine, esomeprazole, hydrocodone-acetaminophen, hydrocodone-acetaminophen, magnesium oxide, oxycodone, oxycodone, oxycodone, and pregabalin, and the following Facility-Administered Medications: fentanyl and midazolam. Her primarily concern today is the Back Pain (lower)  Initial Vital Signs:  Pulse/HCG Rate: 93ECG Heart Rate: 85 Temp: (!) 97.2 F (36.2 C) Resp: 16 BP: 124/81 SpO2: 100 %  BMI: Estimated body mass index is 34.72 kg/m as calculated from the following:   Height as of this encounter: 5\' 3"  (1.6 m).   Weight as of this encounter: 196 lb (88.9 kg).  Risk Assessment: Allergies: Reviewed. She is allergic to asa [aspirin]; bupropion; and tape.  Allergy Precautions: None required Coagulopathies: Reviewed. None identified.  Blood-thinner therapy: None at this time Active Infection(s): Reviewed. None identified. [name] is afebrile  Site Confirmation: [name] was asked to confirm the procedure and laterality before marking the site Procedure checklist: Completed Consent: Before the procedure and under the influence of no sedative(s), amnesic(s), or anxiolytics, the patient was informed of the treatment options, risks and possible complications. To fulfill our ethical and legal obligations, as recommended by the American Medical  Association's Code of Ethics, I have informed the patient of my clinical impression; the nature and purpose of the treatment or procedure; the risks, benefits,  and possible complications of the intervention; the alternatives, including doing nothing; the risk(s) and benefit(s) of the alternative treatment(s) or procedure(s); and the risk(s) and benefit(s) of doing nothing. The patient was provided information about the general risks and possible complications associated with the procedure. These may include, but are not limited to: failure to achieve desired goals, infection, bleeding, organ or nerve damage, allergic reactions, paralysis, and death. In addition, the patient was informed of those risks and complications associated to Spine-related procedures, such as failure to decrease pain; infection (i.e.: Meningitis, epidural or intraspinal abscess); bleeding (i.e.: epidural hematoma, subarachnoid hemorrhage, or any other type of intraspinal or peri-dural bleeding); organ or nerve damage (i.e.: Any type of peripheral nerve, nerve root, or spinal cord injury) with subsequent damage to sensory, motor, and/or autonomic systems, resulting in permanent pain, numbness, and/or weakness of one or several areas of the body; allergic reactions; (i.e.: anaphylactic reaction); and/or death. Furthermore, the patient was informed of those risks and complications associated with the medications. These include, but are not limited to: allergic reactions (i.e.: anaphylactic or anaphylactoid reaction(s)); adrenal axis suppression; blood sugar elevation that in diabetics may result in ketoacidosis or comma; water retention that in patients with history of congestive heart failure may result in shortness of breath, pulmonary edema, and decompensation with resultant heart failure; weight gain; swelling or edema; medication-induced neural toxicity; particulate matter embolism and blood vessel occlusion with resultant organ, and/or  nervous system infarction; and/or aseptic necrosis of one or more joints. Finally, the patient was informed that Medicine is not an exact science; therefore, there is also the possibility of unforeseen or unpredictable risks and/or possible complications that may result in a catastrophic outcome. The patient indicated having understood very clearly. We have given the patient no guarantees and we have made no promises. Enough time was given to the patient to ask questions, all of which were answered to the patient's satisfaction. Ms. Brazzel has indicated that she wanted to continue with the procedure. Attestation: I, the ordering provider, attest that I have discussed with the patient the benefits, risks, side-effects, alternatives, likelihood of achieving goals, and potential problems during recovery for the procedure that I have provided informed consent. Date   Time: 09/10/2019  9:56 AM  Pre-Procedure Preparation:  Monitoring: As per clinic protocol. Respiration, ETCO2, SpO2, BP, heart rate and rhythm monitor placed and checked for adequate function Safety Precautions: Patient was assessed for positional comfort and pressure points before starting the procedure. Time-out: I initiated and conducted the "Time-out" before starting the procedure, as per protocol. The patient was asked to participate by confirming the accuracy of the "Time Out" information. Verification of the correct person, site, and procedure were performed and confirmed by me, the nursing staff, and the patient. "Time-out" conducted as per Joint Commission's Universal Protocol (UP.01.01.01). Time: 1057  Description of Procedure:          Laterality: Left Levels:  L2, L3, L4, L5, & S1 Medial Branch Level(s), at the L3-4, L4-5, and the L5-S1 lumbar facet joints. Area Prepped: Lumbosacral Prepping solution: DuraPrep (Iodine Povacrylex [0.7% available iodine] and Isopropyl Alcohol, 74% w/w) Safety Precautions: Aspiration looking for  blood return was conducted prior to all injections. At no point did we inject any substances, as a needle was being advanced. Before injecting, the patient was told to immediately notify me if she was experiencing any new onset of "ringing in the ears, or metallic taste in the mouth". No attempts were made at seeking any  paresthesias. Safe injection practices and needle disposal techniques used. Medications properly checked for expiration dates. SDV (single dose vial) medications used. After the completion of the procedure, all disposable equipment used was discarded in the proper designated medical waste containers. Local Anesthesia: Protocol guidelines were followed. The patient was positioned over the fluoroscopy table. The area was prepped in the usual manner. The time-out was completed. The target area was identified using fluoroscopy. A 12-in long, straight, sterile hemostat was used with fluoroscopic guidance to locate the targets for each level blocked. Once located, the skin was marked with an approved surgical skin marker. Once all sites were marked, the skin (epidermis, dermis, and hypodermis), as well as deeper tissues (fat, connective tissue and muscle) were infiltrated with a small amount of a short-acting local anesthetic, loaded on a 10cc syringe with a 25G, 1.5-in  Needle. An appropriate amount of time was allowed for local anesthetics to take effect before proceeding to the next step. Local Anesthetic: Lidocaine 2.0% The unused portion of the local anesthetic was discarded in the proper designated containers. Technical explanation of process:  Radiofrequency Ablation (RFA) L2 Medial Branch Nerve RFA: The target area for the L2 medial branch is at the junction of the postero-lateral aspect of the superior articular process and the superior, posterior, and medial edge of the transverse process of L3. Under fluoroscopic guidance, a Radiofrequency needle was inserted until contact was made with os  over the superior postero-lateral aspect of the pedicular shadow (target area). Sensory and motor testing was conducted to properly adjust the position of the needle. Once satisfactory placement of the needle was achieved, the numbing solution was slowly injected after negative aspiration for blood. 2.0 mL of the nerve block solution was injected without difficulty or complication. After waiting for at least 3 minutes, the ablation was performed. Once completed, the needle was removed intact. L3 Medial Branch Nerve RFA: The target area for the L3 medial branch is at the junction of the postero-lateral aspect of the superior articular process and the superior, posterior, and medial edge of the transverse process of L4. Under fluoroscopic guidance, a Radiofrequency needle was inserted until contact was made with os over the superior postero-lateral aspect of the pedicular shadow (target area). Sensory and motor testing was conducted to properly adjust the position of the needle. Once satisfactory placement of the needle was achieved, the numbing solution was slowly injected after negative aspiration for blood. 2.0 mL of the nerve block solution was injected without difficulty or complication. After waiting for at least 3 minutes, the ablation was performed. Once completed, the needle was removed intact. L4 Medial Branch Nerve RFA: The target area for the L4 medial branch is at the junction of the postero-lateral aspect of the superior articular process and the superior, posterior, and medial edge of the transverse process of L5. Under fluoroscopic guidance, a Radiofrequency needle was inserted until contact was made with os over the superior postero-lateral aspect of the pedicular shadow (target area). Sensory and motor testing was conducted to properly adjust the position of the needle. Once satisfactory placement of the needle was achieved, the numbing solution was slowly injected after negative aspiration for  blood. 2.0 mL of the nerve block solution was injected without difficulty or complication. After waiting for at least 3 minutes, the ablation was performed. Once completed, the needle was removed intact. L5 Medial Branch Nerve RFA: The target area for the L5 medial branch is at the junction of the postero-lateral aspect of  the superior articular process of S1 and the superior, posterior, and medial edge of the sacral ala. Under fluoroscopic guidance, a Radiofrequency needle was inserted until contact was made with os over the superior postero-lateral aspect of the pedicular shadow (target area). Sensory and motor testing was conducted to properly adjust the position of the needle. Once satisfactory placement of the needle was achieved, the numbing solution was slowly injected after negative aspiration for blood. 2.0 mL of the nerve block solution was injected without difficulty or complication. After waiting for at least 3 minutes, the ablation was performed. Once completed, the needle was removed intact. S1 Medial Branch Nerve RFA: The target area for the S1 medial branch is located inferior to the junction of the S1 superior articular process and the L5 inferior articular process, posterior, inferior, and lateral to the 6 o'clock position of the L5-S1 facet joint, just superior to the S1 posterior foramen. Under fluoroscopic guidance, the Radiofrequency needle was advanced until contact was made with os over the Target area. Sensory and motor testing was conducted to properly adjust the position of the needle. Once satisfactory placement of the needle was achieved, the numbing solution was slowly injected after negative aspiration for blood. 2.0 mL of the nerve block solution was injected without difficulty or complication. After waiting for at least 3 minutes, the ablation was performed. Once completed, the needle was removed intact. Radiofrequency lesioning (ablation):  Radiofrequency Generator: NeuroTherm  [id number] Sensory Stimulation Parameters: 50 Hz was used to locate & identify the nerve, making sure that the needle was positioned such that there was no sensory stimulation below 0.3 V or above 0.7 V. Motor Stimulation Parameters: 2 Hz was used to evaluate the motor component. Care was taken not to lesion any nerves that demonstrated motor stimulation of the lower extremities at an output of less than 2.5 times that of the sensory threshold, or a maximum of 2.0 V. Lesioning Technique Parameters: Standard Radiofrequency settings. (Not bipolar or pulsed.) Temperature Settings: 80 degrees C Lesioning time: 60 seconds Intra-operative Compliance: Compliant Materials & Medications: Needle(s) (Electrode/Cannula) Type: Teflon-coated, curved tip, Radiofrequency needle(s) Gauge: 22G Length: 10cm Numbing solution: 0.2% PF-Ropivacaine + Triamcinolone (40 mg/mL) diluted to a final concentration of 4 mg of Triamcinolone/mL of Ropivacaine The unused portion of the solution was discarded in the proper designated containers.  Once the entire procedure was completed, the treated area was cleaned, making sure to leave some of the prepping solution back to take advantage of its long term bactericidal properties.  Illustration of the posterior view of the lumbar spine and the posterior neural structures. Laminae of L2 through S1 are labeled. DPRL5, dorsal primary ramus of L5; DPRS1, dorsal primary ramus of S1; DPR3, dorsal primary ramus of L3; FJ, facet (zygapophyseal) joint L3-L4; I, inferior articular process of L4; LB1, lateral branch of dorsal primary ramus of L1; IAB, inferior articular branches from L3 medial branch (supplies L4-L5 facet joint); IBP, intermediate branch plexus; MB3, medial branch of dorsal primary ramus of L3; NR3, third lumbar nerve root; S, superior articular process of L5; SAB, superior articular branches from L4 (supplies L4-5 facet joint also); TP3, transverse process of L3.  Vitals:    09/10/19 1150 09/10/19 1155 09/10/19 1205 09/10/19 1215  BP: (!) 141/91 114/64 (!) 119/57 116/71  Pulse: 97     Resp: 18 16 20 19   Temp:  (!) 97 F (36.1 C)  (!) 97.3 F (36.3 C)  TempSrc:  Temporal    SpO2: 98% 100%  95% 100%  Weight:      Height:        Start Time: 1057 hrs. End Time: 1147 hrs.  Imaging Guidance (Spinal):          Type of Imaging Technique: Fluoroscopy Guidance (Spinal) Indication(s): Assistance in needle guidance and placement for procedures requiring needle placement in or near specific anatomical locations not easily accessible without such assistance. Exposure Time: Please see nurses notes. Contrast: None used. Fluoroscopic Guidance: I was personally present during the use of fluoroscopy. "Tunnel Vision Technique" used to obtain the best possible view of the target area. Parallax error corrected before commencing the procedure. "Direction-depth-direction" technique used to introduce the needle under continuous pulsed fluoroscopy. Once target was reached, antero-posterior, oblique, and lateral fluoroscopic projection used confirm needle placement in all planes. Images permanently stored in EMR. Interpretation: No contrast injected. I personally interpreted the imaging intraoperatively. Adequate needle placement confirmed in multiple planes. Permanent images saved into the patient's record.  Antibiotic Prophylaxis:   Anti-infectives (From admission, onward)   None     Indication(s): None identified  Post-operative Assessment:  Post-procedure Vital Signs:  Pulse/HCG Rate: 9785 Temp: (!) 97.3 F (36.3 C) Resp: 19 BP: 116/71 SpO2: 100 %  EBL: None  Complications: No immediate post-treatment complications observed by team, or reported by patient.  Note: The patient tolerated the entire procedure well. A repeat set of vitals were taken after the procedure and the patient was kept under observation following institutional policy, for this type of procedure.  Post-procedural neurological assessment was performed, showing return to baseline, prior to discharge. The patient was provided with post-procedure discharge instructions, including a section on how to identify potential problems. Should any problems arise concerning this procedure, the patient was given instructions to immediately contact us, at any time, without hesitation. In any case, we plan to contact the patient by telephone for a follow-up status report regarding this interventional procedure.  Comments:  No additional relevant information.  Plan of Care  Orders:  Orders Placed This Encounter  Procedures   Radiofrequency,Lumbar    Scheduling Instructions:     Side(s): Left-sided     Level: L3-4, L4-5, & L5-S1 Facets (L2, L3, L4, L5, & S1 Medial Branch Nerves)     Sedation: With Sedation     Timeframe: Today    Order Specific Question:   Where will this procedure be performed?    Answer:   [facility]   DG PAIN [facility] C-ARM 1-60 MIN NO REPORT    Intraoperative interpretation by procedural physician at [facility].    Standing Status:   Standing    Number of Occurrences:   1    Order Specific Question:   Reason for exam:    Answer:   Assistance in needle guidance and placement for procedures requiring needle placement in or near specific anatomical locations not easily accessible without such assistance.   Informed Consent Details: Physician/Practitioner Attestation; Transcribe to consent form and obtain patient signature    Nursing Order: Transcribe to consent form and obtain patient signature. Note: Always confirm laterality of pain with [name], before procedure. Procedure: Lumbar Facet Radiofrequency Ablation Indication/Reason: Low Back Pain, with our without leg pain, due to Facet Joint Arthralgia (Joint Pain) known as Lumbar Facet Syndrome, secondary to Lumbar, and/or Lumbosacral Spondylosis (Arthritis of the Spine), without myelopathy or radiculopathy  (Nerve Damage). Provider Attestation: I, Elisse Pennick A. Laban EmperorNaveira, MD, (Pain Management Specialist), the physician/practitioner, attest that I have discussed with the  patient the benefits, risks, side effects, alternatives, likelihood of achieving goals and potential problems during recovery for the procedure that I have provided informed consent.   Provide equipment / supplies at bedside    Equipment required: Sterile "Radiofrequency Tray"; Large hemostat (1); Small hemostat (1); Towels (6-8); 4x4 sterile sponge pack (1) Radiofrequency Needle(s): Size: Regular Quantity: 5    Standing Status:   Standing    Number of Occurrences:   1    Order Specific Question:   Specify    Answer:   Radiofrequency Tray   Chronic Opioid Analgesic:  Oxycodone IR 5 mg, 1 tab PO q 8 hrs (15 mg/day of oxycodone) MME/day: 22.5 mg/day.   Medications ordered for procedure: Meds ordered this encounter  Medications   lidocaine (XYLOCAINE) 2 % (with pres) injection 400 mg   lactated ringers infusion 1,000 mL   midazolam (VERSED) 5 MG/5ML injection 1 mg    Make sure Flumazenil is available in the pyxis when using this medication. If oversedation occurs, administer 0.2 mg IV over 15 sec. If after 45 sec no response, administer 0.2 mg again over 1 min; may repeat at 1 min intervals; not to exceed 4 doses (1 mg)   fentaNYL (SUBLIMAZE) injection 25 mcg    Make sure Narcan is available in the pyxis when using this medication. In the event of respiratory depression (RR< 8/min): Titrate NARCAN (naloxone) in increments of 0.1 to 0.2 mg IV at 2-3 minute intervals, until desired degree of reversal.   triamcinolone acetonide (KENALOG-40) injection 40 mg   ropivacaine (PF) 2 mg/mL (0.2%) (NAROPIN) injection 9 mL   HYDROcodone-acetaminophen (NORCO/VICODIN) 5-325 MG tablet    Sig: Take 1 tablet by mouth every 8 (eight) hours as needed for up to 7 days for severe pain. Must last 7 days.    Dispense:  21 tablet    Refill:  0      For acute post-operative pain. Not to be refilled. Must last 7 days.   HYDROcodone-acetaminophen (NORCO/VICODIN) 5-325 MG tablet    Sig: Take 1 tablet by mouth every 8 (eight) hours as needed for up to 7 days for severe pain. Must last for 7 days.    Dispense:  21 tablet    Refill:  0    For acute post-operative pain. Not to be refilled. Must last 7 days.   cyclobenzaprine (FLEXERIL) 10 MG tablet    Sig: Take 1 tablet (10 mg total) by mouth 3 (three) times daily.    Dispense:  90 tablet    Refill:  5    Fill one day early if pharmacy is closed on scheduled refill date. May substitute for generic if available.   baclofen (LIORESAL) 10 MG tablet    Sig: Take 1-2 tablets (10-20 mg total) by mouth at bedtime.    Dispense:  60 tablet    Refill:  5    Fill one day early if pharmacy is closed on scheduled refill date. May substitute for generic if available.   Magnesium Oxide 500 MG CAPS    Sig: Take 1 capsule (500 mg total) by mouth at bedtime.    Dispense:  30 capsule    Refill:  11    Fill one day early if pharmacy is closed on scheduled refill date. May substitute for generic if available.   esomeprazole (NEXIUM) 40 MG capsule    Sig: Take 1 capsule (40 mg total) by mouth at bedtime. Take while on ibuprofen.    Dispense:  30 capsule    Refill:  5    Fill one day early if pharmacy is closed on scheduled refill date. May substitute for generic if available.   pregabalin (LYRICA) 150 MG capsule    Sig: Take 1 capsule (150 mg total) by mouth every 8 (eight) hours as needed.    Dispense:  90 capsule    Refill:  5    Fill one day early if pharmacy is closed on scheduled refill date. May substitute for generic if available.   oxyCODONE (OXY IR/ROXICODONE) 5 MG immediate release tablet    Sig: Take 1 tablet (5 mg total) by mouth every 8 (eight) hours as needed for severe pain. Must last 30 days    Dispense:  90 tablet    Refill:  0    Chronic Pain: STOP Act (Not applicable) Fill 1  day early if closed on refill date. Do not fill until: 09/18/2019. To last until: 10/18/2019. Avoid benzodiazepines within 8 hours of opioids   oxyCODONE (OXY IR/ROXICODONE) 5 MG immediate release tablet    Sig: Take 1 tablet (5 mg total) by mouth every 8 (eight) hours as needed for severe pain. Must last 30 days    Dispense:  90 tablet    Refill:  0    Chronic Pain: STOP Act (Not applicable) Fill 1 day early if closed on refill date. Do not fill until: 10/18/2019. To last until: 11/17/2019. Avoid benzodiazepines within 8 hours of opioids   oxyCODONE (OXY IR/ROXICODONE) 5 MG immediate release tablet    Sig: Take 1 tablet (5 mg total) by mouth every 8 (eight) hours as needed for severe pain. Must last 30 days    Dispense:  90 tablet    Refill:  0    Chronic Pain: STOP Act (Not applicable) Fill 1 day early if closed on refill date. Do not fill until: 11/17/2019. To last until: 12/17/2019. Avoid benzodiazepines within 8 hours of opioids   Medications administered: We administered lidocaine, lactated ringers, midazolam, fentaNYL, triamcinolone acetonide, and ropivacaine (PF) 2 mg/mL (0.2%).  See the medical record for exact dosing, route, and time of administration.  Follow-up plan:   Return in about 6 weeks (around 10/22/2019) for (VV), (PP).       Interventional management options: Considering:   Diagnostic Trigger point injections Diagnostic bilateral LESI Possiblebilateral Racz(Epidural Neurolysis) Diagnostic thoracic facet nerve block Possible thoracic facet RFA Diagnostic bilateral CESI Diagnosticbilateral cervical facet nerve block Possiblebilateral cervical facet RFA Diagnostic right shoulder intra-articular injection Diagnostic right suprascapular nerve block Possibleright suprascapular RFA Diagnostic bilateral genicular nerve block Possible bilateral genicular RFA   Palliative PRN treatment(s):   Palliative right-sided T6-7 TESI #2  Palliative bilateral  lumbar facet block #4  Palliative right-sided sacroiliac joint block #3  Palliative right-sided intra-articular hip joint injections  Palliative right-sided lumbar facet RFA #2 (last done on 05/03/2018)  Palliative right-sided sacroiliac joint RFA #2 (last done on 05/03/2018)  Palliative left-sided lumbar facet RFA #2 (last done 08/16/2018)  Palliative right-sided genicular nerve block #2          Recent Visits Date Type Provider Dept  07/25/19 Procedure visit Delano Metz, MD [facility]  06/27/19 Office Visit Delano Metz, MD [facility]  Showing recent visits within past 90 days and meeting all other requirements   Today's Visits Date Type Provider Dept  09/10/19 Procedure visit Delano Metz, MD [facility]  Showing today's visits and meeting all other requirements   Future Appointments Date  Type Provider Dept  10/23/19 Appointment Delano Metz, MD [facility]  12/09/19 Appointment Delano Metz, MD [facility]  Showing future appointments within next 90 days and meeting all other requirements   Disposition: Discharge home  Discharge Date & Time: 09/10/2019; 1219 hrs.   Primary Care Physician: Ignatius Specking, MD Location: [facility] Note by: Oswaldo Done, MD Date: 09/10/2019; Time: 12:44 PM  Disclaimer:  Medicine is not an Visual merchandiser. The only guarantee in medicine is that nothing is guaranteed. It is important to note that the decision to proceed with this intervention was based on the information collected from the patient. The Data and conclusions were drawn from the patient's questionnaire, the interview, and the physical examination. Because the information was provided in large part by the patient, it cannot be guaranteed that it has not been purposely or unconsciously manipulated. Every effort has been made to obtain as much relevant data as possible for this  evaluation. It is important to note that the conclusions that lead to this procedure are derived in large part from the available data. Always take into account that the treatment will also be dependent on availability of resources and existing treatment guidelines, considered by other Pain Management Practitioners as being common knowledge and practice, at the time of the intervention. For Medico-Legal purposes, it is also important to point out that variation in procedural techniques and pharmacological choices are the acceptable norm. The indications, contraindications, technique, and results of the above procedure should only be interpreted and judged by a Board-Certified Interventional Pain Specialist with extensive familiarity and expertise in the same exact procedure and technique.

## 2019-09-11 ENCOUNTER — Telehealth: Payer: Self-pay

## 2019-09-11 NOTE — Telephone Encounter (Signed)
Post procedure phone call.  Patient states she is doing well.  States she is a little sore.  Instructed to put heat on today and to call us if she needed anything.

## 2019-09-16 ENCOUNTER — Ambulatory Visit: Payer: PPO | Admitting: Pain Medicine

## 2019-10-21 ENCOUNTER — Telehealth: Payer: Self-pay | Admitting: *Deleted

## 2019-10-21 NOTE — Telephone Encounter (Signed)
Returned patient phone call. She wasn't sure what type of appointment that she had on Wednesday 10/23/19. Patient was informed that it is a virtual appointment on the phone and she didn't have to come in the clinic. Patient verbalized understanding.

## 2019-10-22 ENCOUNTER — Telehealth: Payer: Self-pay | Admitting: *Deleted

## 2019-10-22 NOTE — Progress Notes (Signed)
Disclaimer: In compliance with Federal Rules mandating open notes, implemented by the [facility], these notes are made available to patients through the electronic medical record. Information contained herein reflects the providers review of information obtained during the pre-charting review of records, as well as notes taken during the patients appointment.  Although all data contained herein was reviewed by the provider, unless specifically stated, due to time constrains, not all of it was discussed with the patient during the appointment. Specific medical language and abbreviations used within medical records is meant to communicate precise data to individuals trained to interpret such data.  Warning: These encounter notes are not meant to serve the purpose of providing patients with clear and concise information on their conditions, treatment plan, or specific risks and possible complications. Such information is provided to patients under the encounter's "Patient Information" section. Interpretation of the information contained herein should be left to individuals with the necessary training to understand the data.  Pain Management Virtual Encounter Note - Virtual Visit via Telephone Telehealth (real-time audio visits between healthcare provider and patient).   Patient's Phone No. & Preferred Pharmacy:  [phone] (home); [phone] (mobile); (Preferred) [phone] No e-mail address on record  [place] [place] [address] Phone: [phone] Fax: [phone]    Pre-screening note:  Our staff contacted Jennifer Harmon and offered her an "in person", "face-to-face" appointment versus a telephone encounter. She indicated preferring the telephone encounter, at this time.   Reason for Virtual Visit: COVID-19*  Social distancing based on CDC and AMA recommendations.   I contacted Jennifer Harmon on 10/23/2019 via  telephone.      I clearly identified myself as Gaspar Cola, MD. I verified that I was speaking with the correct person using two identifiers (Name: Jennifer Harmon, and date of birth: November 27, 1960).  Advanced Informed Consent I sought verbal advanced consent from Jennifer Harmon for virtual visit interactions. I informed Jennifer Harmon of possible security and privacy concerns, risks, and limitations associated with providing "not-in-person" medical evaluation and management services. I also informed Jennifer Harmon of the availability of "in-person" appointments. Finally, I informed her that there would be a charge for the virtual visit and that she could be  personally, fully or partially, financially responsible for it. Jennifer Harmon expressed understanding and agreed to proceed.   Historic Elements   Jennifer Harmon is a 58 y.o. year old, female patient evaluated today after her last encounter by our practice on 10/22/2019. Jennifer Harmon  has a past medical history of Acute postoperative pain (05/03/2018), Anxiety, Chronic chest pain, Depression, DJD (degenerative joint disease), Fibromyalgia, GERD (gastroesophageal reflux disease), and Hypercholesterolemia. She also  has a past surgical history that includes Cholecystectomy; Cesarean section; Back surgery (2008); and Joint replacement (Right, 04/16/2019). Jennifer Harmon has a current medication list which includes the following prescription(s): baclofen, clonazepam, cyclobenzaprine, esomeprazole, latanoprost, magnesium oxide, multivitamin with minerals, mupirocin ointment, [START ON 11/17/2019] oxycodone, pregabalin, rosuvastatin, [START ON 12/17/2019] oxycodone, and [START ON 01/16/2020] oxycodone. She  reports that she quit smoking about 3 years ago. Her smoking use included pipe. She has a 10.00 pack-year smoking history. She uses smokeless tobacco. She reports that she does not drink alcohol or use drugs. Jennifer Harmon is allergic to asa [aspirin]; bupropion;  and tape.   HPI  Today, she is being contacted for both, medication management and a post-procedure assessment.  Today  when I spoke to the patient she was crying indicating that she was not sure what happened but she is having more pain in the lower back and she has been experiencing some muscle spasms and cramps.  Today I took the time to collect some information from her and according to her description of the symptoms, she has been experiencing some spasms, which may be secondary to the side effects of the steroids.  She might of lost some electrolytes that have now triggered this muscle cramps.  I asked her to get penicillin paper and to write down some recommendations.  I told her to take the magnesium 500 mg twice daily, to drink Gatorade with each meal, and to also drink a glass of tonic water at bedtime.  She is already taking baclofen and Flexeril and therefore there is no need to add any more muscle relaxants.  Regarding her pain, she indicates that she is experiencing a lot of pain in the lower portion of the back, between her buttocks and the back.  From her description, it seems to be around the PSIS area.  Typically, I see this type of reaction after radiofrequency ablations and it is secondary to trigger points that develop as a consequence of the needling of the muscles during the procedure.  She had a radiofrequency ablation of the lumbar facets on the left side on 09/10/2019 and on the right side on 07/25/2019.  Today she was anxious thinking that something had gone wrong due to the fact that she was experiencing more pain after the procedures than what she normally feels after the facet blocks.  I reminded her that the 2 are not the same and I also reminded her to read the information that we provided her after the radiofrequencies where it states that she is a can experience more pain than usual for up to 6 weeks after the procedure, secondary to the ablation of the nerves.  During today's  visit I also identified that I had written they Lyrica prescription indicating to take it "as needed".  This was probably on or side of my partner but today I have changed that prescription to 3 times daily (not PRN).  I reminded the patient about what we had talked prior to the prescription that it needed to be taken regularly in order for adequate blood levels to be developed and for the medicine to protect her against that neuropathic/neurogenic component of her pain.  Because of her anxiety, it was difficult for me to get a clear picture as to what is going on with her back, and therefore I have schedule her to come back to the clinic for a face-to-face visit so that I can do appropriate physical exam on her and determine what is going on.  I have also instructed her to be ready to have a trigger point injection, should that become necessary.  She understood and accepted.  Post-Procedure Evaluation  Procedure: Palliative left-sided lumbar facet RFA #2  Pre-procedure pain level:  7/10 Post-procedure: 3/10 (> 50% relief)  Sedation: Sedation provided.  Concepcion ElkWheatley, Dena L, RN  10/23/2019  1:38 PM  Sign when Signing Visit Pain relief after procedure (treated area only): (Questions asked to patient) 1. Starting about 15 minutes after the procedure, and "while the area was still numb" (from the local anesthetics), were you having any of your usual pain "in that area" (the treated area)?  (NOTE: NOT including the discomfort from the needle sticks.) First 1 hour:100 %  better. First 4-6 hours:100 % better. 2. How long did the numbness from the local anesthetics last? (More than 6 hours?) Duration: 24 hours.  3. How much better is your pain now, when compared to before the procedure? Current benefit: 0 % better. 4. Can you move better now? Improvement in ROM (Range of Motion): No. 5. Can you do more now? Improvement in function: No. 4. Did you have any problems with the  procedure? Side-effects/Complications: No.  Current benefits: Defined as benefit that persist at this time.   Analgesia:  Back to baseline Function: No improvement ROM: Back to baseline  Pharmacotherapy Assessment  Analgesic: Oxycodone IR 5 mg, 1 tab PO q 8 hrs (15 mg/day of oxycodone) MME/day: 22.5 mg/day.   Monitoring: Pharmacotherapy: No side-effects or adverse reactions reported. [place] PMP: PDMP reviewed during this encounter.       Compliance: No problems identified. Effectiveness: Clinically acceptable. Plan: Refer to "POC".  UDS:  Summary  Date Value Ref Range Status  12/24/2018 FINAL  Final    Comment:    ==================================================================== TOXASSURE SELECT 13 (MW) ==================================================================== Test                             Result       Flag       Units Drug Present and Declared for Prescription Verification   7-aminoclonazepam              315          EXPECTED   ng/mg creat    7-aminoclonazepam is an expected metabolite of clonazepam. Source    of clonazepam is a scheduled prescription medication.   Oxycodone                      221          EXPECTED   ng/mg creat   Oxymorphone                    247          EXPECTED   ng/mg creat   Noroxycodone                   573          EXPECTED   ng/mg creat   Noroxymorphone                 111          EXPECTED   ng/mg creat    Sources of oxycodone are scheduled prescription medications.    Oxymorphone, noroxycodone, and noroxymorphone are expected    metabolites of oxycodone. Oxymorphone is also available as a    scheduled prescription medication. ==================================================================== Test                      Result    Flag   Units      Ref Range   Creatinine              112              mg/dL      >=23 ==================================================================== Declared Medications:  The flagging and  interpretation on this report are based on the  following declared medications.  Unexpected results may arise from  inaccuracies in the declared medications.  **Note: The testing scope of this panel includes these medications:  Clonazepam  Oxycodone  **Note: The testing  scope of this panel does not include following  reported medications:  Citalopram  Cyclobenzaprine  Latanoprost  Magnesium Oxide  Multivitamin  Pregabalin  Ranitidine  Rosuvastatin ==================================================================== For clinical consultation, please call [phone]. ====================================================================    Laboratory Chemistry Profile (12 mo)  Renal: No results found for requested labs within last 8760 hours.  Lab Results  Component Value Date   GFRAA >60 09/06/2017   GFRNONAA >60 09/06/2017   Hepatic: No results found for requested labs within last 8760 hours. Lab Results  Component Value Date   AST 26 09/06/2017   ALT 31 09/06/2017   Other: No results found for requested labs within last 8760 hours. Note: Above Lab results reviewed.  Imaging  DG PAIN CLINIC C-ARM 1-60 MIN NO REPORT Fluoro was used, but no Radiologist interpretation will be provided.  Please refer to "NOTES" tab for provider progress note.  Assessment  The primary encounter diagnosis was Chronic pain syndrome. Diagnoses of Chronic upper back pain (Primary Area of Pain) (Bilateral) (R>L), Chronic low back pain  (Secondary Area of Pain) (Bilateral) (R>L), Chronic neck pain (Tertiary Area of Pain) (Bilateral) (R>L), Chronic knee pain (Fourth Area of Pain) (Bilateral) (R>L), Lumbar facet syndrome (Bilateral) (R>L), DISH (diffuse idiopathic skeletal hyperostosis), Neurogenic pain, Night cramps, Lumbar paraspinal muscle spasm, Pharmacologic therapy, Disorder of skeletal system, and Problems influencing health status were also pertinent to this visit.  Plan of Care   Problem-specific:  No problem-specific Assessment & Plan notes found for this encounter.  I have changed Jennifer Harmon pregabalin. I am also having her start on oxyCODONE. Additionally, I am having her maintain her clonazePAM, latanoprost, rosuvastatin, multivitamin with minerals, mupirocin ointment, cyclobenzaprine, baclofen, Magnesium Oxide, esomeprazole, oxyCODONE, and oxyCODONE.  Pharmacotherapy (Medications Ordered): Meds ordered this encounter  Medications  . oxyCODONE (OXY IR/ROXICODONE) 5 MG immediate release tablet    Sig: Take 1 tablet (5 mg total) by mouth every 8 (eight) hours as needed for severe pain. Must last 30 days    Dispense:  90 tablet    Refill:  0    Chronic Pain: STOP Act (Not applicable) Fill 1 day early if closed on refill date. Do not fill until: 12/17/2019. To last until: 01/16/2020. Avoid benzodiazepines within 8 hours of opioids  . oxyCODONE (OXY IR/ROXICODONE) 5 MG immediate release tablet    Sig: Take 1 tablet (5 mg total) by mouth every 8 (eight) hours as needed for severe pain. Must last 30 days    Dispense:  90 tablet    Refill:  0    Chronic Pain: STOP Act (Not applicable) Fill 1 day early if closed on refill date. Do not fill until: 01/16/2020. To last until: 02/15/2020. Avoid benzodiazepines within 8 hours of opioids  . pregabalin (LYRICA) 150 MG capsule    Sig: Take 1 capsule (150 mg total) by mouth 3 (three) times daily.    Dispense:  90 capsule    Refill:  5    Fill one day early if pharmacy is closed on scheduled refill date. May substitute for generic if available.   Orders:  Orders Placed This Encounter  Procedures  . Comprehensive Metabolic Panel w/GFR    With GFR. Indications: Chronic Pain Syndrome (G89.4) & Pharmacotherapy (U98.119(Z79.899)    Order Specific Question:   Has the patient fasted?    Answer:   No    Order Specific Question:   CC Results    Answer:   PCP-NURSE [[id number]]  . Magnesium    Indication:  Pharmacologic therapy (B33.832)     Order Specific Question:   CC Results    Answer:   PCP-NURSE [[id number]]  . Vitamin B12    Indication: Pharmacologic therapy (M60.045).    Order Specific Question:   CC Results    Answer:   PCP-NURSE [[id number]]  . Sedimentation rate    Indication: Disorder of skeletal system (M89.9)    Order Specific Question:   CC Results    Answer:   PCP-NURSE [[id number]]  . Vitamin D (D2+D3)    Indication: Disorder of skeletal system (M89.9).    Order Specific Question:   CC Results    Answer:   PCP-NURSE [[id number]]  . CRP    Indication: Problems influencing health status (Z78.9)    Order Specific Question:   CC Results    Answer:   PCP-NURSE [[id number]]   Follow-up plan:   Return in about 16 weeks (around 02/12/2020) for (VV), (MM), in addition, Procedure (no sedation): Possible (B) PSIS TPI/MNB, (ASAP).      Interventional management options: Considering:   Diagnostic Trigger point injections Diagnostic bilateral LESI Possiblebilateral Racz(Epidural Neurolysis) Diagnostic thoracic facet nerve block Possible thoracic facet RFA Diagnostic bilateral CESI Diagnosticbilateral cervical facet nerve block Possiblebilateral cervical facet RFA Diagnostic right shoulder intra-articular injection Diagnostic right suprascapular nerve block Possibleright suprascapular RFA Diagnostic bilateral genicular nerve block Possible bilateral genicular RFA   Palliative PRN treatment(s):   Palliative right-sided T6-7 TESI #2  Palliative bilateral lumbar facet block #4  Palliative right-sided sacroiliac joint block #3  Palliative right-sided intra-articular hip joint injections  Palliative right-sided lumbar facet RFA #2 (last done on 05/03/2018)  Palliative right-sided sacroiliac joint RFA #2 (last done on 05/03/2018)  Palliative left-sided lumbar facet RFA #2 (last done 08/16/2018)  Palliative right-sided genicular nerve block #2     Recent Visits Date Type Provider Dept  09/10/19 Procedure visit  Delano Metz, MD [facility]  07/25/19 Procedure visit Delano Metz, MD [facility]  Showing recent visits within past 90 days and meeting all other requirements   Today's Visits Date Type Provider Dept  10/23/19 Telemedicine Delano Metz, MD [facility]  Showing today's visits and meeting all other requirements   Future Appointments Date Type Provider Dept  12/09/19 Appointment Delano Metz, MD [facility]  Showing future appointments within next 90 days and meeting all other requirements   I discussed the assessment and treatment plan with the patient. The patient was provided an opportunity to ask questions and all were answered. The patient agreed with the plan and demonstrated an understanding of the instructions.  Patient advised to call back or seek an in-person evaluation if the symptoms or condition worsens.  Total duration of non-face-to-face encounter: 25 minutes.  Note by: Oswaldo Done, MD Date: 10/23/2019; Time: 2:21 PM  Note: This dictation was prepared with Dragon dictation. Any transcriptional errors that may result from this process are unintentional.

## 2019-10-22 NOTE — Telephone Encounter (Signed)
Attempted to call for pre appointment review of allergies/meds. Voice mailbox full.

## 2019-10-23 ENCOUNTER — Telehealth: Payer: Self-pay | Admitting: *Deleted

## 2019-10-23 ENCOUNTER — Other Ambulatory Visit: Payer: Self-pay

## 2019-10-23 ENCOUNTER — Ambulatory Visit: Payer: PPO | Attending: Pain Medicine | Admitting: Pain Medicine

## 2019-10-23 ENCOUNTER — Encounter: Payer: Self-pay | Admitting: Pain Medicine

## 2019-10-23 ENCOUNTER — Encounter: Payer: PPO | Admitting: Pain Medicine

## 2019-10-23 DIAGNOSIS — Z79899 Other long term (current) drug therapy: Secondary | ICD-10-CM | POA: Diagnosis not present

## 2019-10-23 DIAGNOSIS — M47816 Spondylosis without myelopathy or radiculopathy, lumbar region: Secondary | ICD-10-CM

## 2019-10-23 DIAGNOSIS — M25561 Pain in right knee: Secondary | ICD-10-CM

## 2019-10-23 DIAGNOSIS — M542 Cervicalgia: Secondary | ICD-10-CM

## 2019-10-23 DIAGNOSIS — R252 Cramp and spasm: Secondary | ICD-10-CM

## 2019-10-23 DIAGNOSIS — Z789 Other specified health status: Secondary | ICD-10-CM

## 2019-10-23 DIAGNOSIS — M4810 Ankylosing hyperostosis [Forestier], site unspecified: Secondary | ICD-10-CM

## 2019-10-23 DIAGNOSIS — M549 Dorsalgia, unspecified: Secondary | ICD-10-CM

## 2019-10-23 DIAGNOSIS — M899 Disorder of bone, unspecified: Secondary | ICD-10-CM | POA: Diagnosis not present

## 2019-10-23 DIAGNOSIS — G8929 Other chronic pain: Secondary | ICD-10-CM

## 2019-10-23 DIAGNOSIS — M62830 Muscle spasm of back: Secondary | ICD-10-CM

## 2019-10-23 DIAGNOSIS — M25562 Pain in left knee: Secondary | ICD-10-CM

## 2019-10-23 DIAGNOSIS — G894 Chronic pain syndrome: Secondary | ICD-10-CM | POA: Diagnosis not present

## 2019-10-23 DIAGNOSIS — M5441 Lumbago with sciatica, right side: Secondary | ICD-10-CM

## 2019-10-23 DIAGNOSIS — M792 Neuralgia and neuritis, unspecified: Secondary | ICD-10-CM

## 2019-10-23 DIAGNOSIS — M5442 Lumbago with sciatica, left side: Secondary | ICD-10-CM

## 2019-10-23 MED ORDER — OXYCODONE HCL 5 MG PO TABS: 5.0000 mg | ORAL_TABLET | Freq: Three times a day (TID) | ORAL | 0 refills | Status: DC | PRN

## 2019-10-23 MED ORDER — PREGABALIN 150 MG PO CAPS: 150.0000 mg | ORAL_CAPSULE | Freq: Three times a day (TID) | ORAL | 5 refills | Status: DC

## 2019-10-23 NOTE — Telephone Encounter (Signed)
Attempted to call for pre appointment review of allergies/meds. Message left. 

## 2019-10-23 NOTE — Patient Instructions (Signed)

## 2019-10-23 NOTE — Progress Notes (Signed)
Pain relief after procedure (treated area only): (Questions asked to patient) 1. Starting about 15 minutes after the procedure, and "while the area was still numb" (from the local anesthetics), were you having any of your usual pain "in that area" (the treated area)?  (NOTE: NOT including the discomfort from the needle sticks.) First 1 hour: 100 % better. First 4-6 hours: 100 % better. 2. How long did the numbness from the local anesthetics last? (More than 6 hours?) Duration: 24 hours.  3. How much better is your pain now, when compared to before the procedure? Current benefit:0% better. 4. Can you move better now? Improvement in ROM (Range of Motion): No. 5. Can you do more now? Improvement in function: No. 4. Did you have any problems with the procedure? Side-effects/Complications: No. 

## 2019-10-29 ENCOUNTER — Ambulatory Visit: Payer: PPO | Admitting: Pain Medicine

## 2019-11-04 ENCOUNTER — Telehealth: Payer: Self-pay | Admitting: *Deleted

## 2019-11-04 NOTE — Telephone Encounter (Signed)
All questions answered

## 2019-11-05 ENCOUNTER — Ambulatory Visit (HOSPITAL_BASED_OUTPATIENT_CLINIC_OR_DEPARTMENT_OTHER): Payer: PPO | Admitting: Pain Medicine

## 2019-11-05 ENCOUNTER — Other Ambulatory Visit: Payer: Self-pay

## 2019-11-05 ENCOUNTER — Encounter: Payer: Self-pay | Admitting: Pain Medicine

## 2019-11-05 ENCOUNTER — Other Ambulatory Visit
Admission: RE | Admit: 2019-11-05 | Discharge: 2019-11-05 | Disposition: A | Discharge status: Home / Self Care | Payer: PPO | Source: Ambulatory Visit | Attending: Pain Medicine | Admitting: Pain Medicine

## 2019-11-05 VITALS — BP 99/86 | HR 90 | Temp 97.3°F | Resp 18 | Ht 63.0 in | Wt 196.0 lb

## 2019-11-05 DIAGNOSIS — M549 Dorsalgia, unspecified: Secondary | ICD-10-CM | POA: Insufficient documentation

## 2019-11-05 DIAGNOSIS — M5441 Lumbago with sciatica, right side: Secondary | ICD-10-CM | POA: Insufficient documentation

## 2019-11-05 DIAGNOSIS — M7918 Myalgia, other site: Secondary | ICD-10-CM | POA: Diagnosis not present

## 2019-11-05 DIAGNOSIS — G8929 Other chronic pain: Secondary | ICD-10-CM | POA: Insufficient documentation

## 2019-11-05 DIAGNOSIS — G8918 Other acute postprocedural pain: Secondary | ICD-10-CM | POA: Insufficient documentation

## 2019-11-05 DIAGNOSIS — M5442 Lumbago with sciatica, left side: Secondary | ICD-10-CM | POA: Insufficient documentation

## 2019-11-05 LAB — COMPREHENSIVE METABOLIC PANEL
ALT: 20 U/L (ref 0–44)
AST: 17 U/L (ref 15–41)
Albumin: 4 g/dL (ref 3.5–5.0)
Alkaline Phosphatase: 109 U/L (ref 38–126)
Anion gap: 11 (ref 5–15)
BUN: 12 mg/dL (ref 6–20)
CO2: 28 mmol/L (ref 22–32)
Calcium: 9.3 mg/dL (ref 8.9–10.3)
Chloride: 102 mmol/L (ref 98–111)
Creatinine, Ser: 0.53 mg/dL (ref 0.44–1.00)
GFR calc Af Amer: >60 mL/min (ref >60)
GFR calc non Af Amer: >60 mL/min (ref >60)
Glucose, Bld: 102 mg/dL — ABNORMAL HIGH (ref 70–99)
Potassium: 3.7 mmol/L (ref 3.5–5.1)
Sodium: 141 mmol/L (ref 135–145)
Total Bilirubin: 0.7 mg/dL (ref 0.3–1.2)
Total Protein: 7.5 g/dL (ref 6.5–8.1)

## 2019-11-05 LAB — SEDIMENTATION RATE: Sed Rate: 21 mm/hr (ref 0–30)

## 2019-11-05 LAB — MAGNESIUM: Magnesium: 2.2 mg/dL (ref 1.7–2.4)

## 2019-11-05 LAB — VITAMIN D 25 HYDROXY (VIT D DEFICIENCY, FRACTURES): Vit D, 25-Hydroxy: 21.15 ng/mL — ABNORMAL LOW (ref 30–100)

## 2019-11-05 LAB — VITAMIN B12: Vitamin B-12: 281 pg/mL (ref 180–914)

## 2019-11-05 LAB — C-REACTIVE PROTEIN: CRP: 0.6 mg/dL (ref <1.0)

## 2019-11-05 MED ORDER — LIDOCAINE HCL 2 % IJ SOLN
INTRAMUSCULAR | Status: AC
  Filled 2019-11-05: qty 20

## 2019-11-05 MED ORDER — DEXAMETHASONE SODIUM PHOSPHATE 10 MG/ML IJ SOLN
10.0000 mg | Freq: Once | INTRAMUSCULAR | Status: AC
  Administered 2019-11-05: 10 mg

## 2019-11-05 MED ORDER — METHYLPREDNISOLONE ACETATE 80 MG/ML IJ SUSP
80.0000 mg | Freq: Once | INTRAMUSCULAR | Status: AC
  Administered 2019-11-05: 80 mg via INTRA_ARTICULAR

## 2019-11-05 MED ORDER — KETOROLAC TROMETHAMINE 60 MG/2ML IM SOLN
60.0000 mg | Freq: Once | INTRAMUSCULAR | Status: AC
  Administered 2019-11-05: 60 mg via INTRAMUSCULAR
  Filled 2019-11-05: qty 2

## 2019-11-05 MED ORDER — ROPIVACAINE HCL 2 MG/ML IJ SOLN
4.0000 mL | Freq: Once | INTRAMUSCULAR | Status: AC
  Administered 2019-11-05: 4 mL

## 2019-11-05 MED ORDER — LIDOCAINE HCL 2 % IJ SOLN
4.0000 mL | Freq: Once | INTRAMUSCULAR | Status: AC
  Administered 2019-11-05: 80 mg

## 2019-11-05 MED ORDER — METHYLPREDNISOLONE ACETATE 80 MG/ML IJ SUSP
INTRAMUSCULAR | Status: AC
  Filled 2019-11-05: qty 1

## 2019-11-05 MED ORDER — ROPIVACAINE HCL 2 MG/ML IJ SOLN
INTRAMUSCULAR | Status: AC
  Filled 2019-11-05: qty 10

## 2019-11-05 MED ORDER — DEXAMETHASONE SODIUM PHOSPHATE 10 MG/ML IJ SOLN
INTRAMUSCULAR | Status: AC
  Filled 2019-11-05: qty 1

## 2019-11-05 MED ORDER — ORPHENADRINE CITRATE 30 MG/ML IJ SOLN
60.0000 mg | Freq: Once | INTRAMUSCULAR | Status: AC
  Administered 2019-11-05: 60 mg via INTRAMUSCULAR
  Filled 2019-11-05: qty 2

## 2019-11-05 NOTE — Progress Notes (Signed)
PROVIDER NOTE: Information contained herein reflects review and annotations entered in association with encounter. Interpretation of such information and data should be left to medically-trained personnel. Information provided to patient can be located elsewhere in the medical record under "Patient Instructions". Document created using STT-dictation technology, any transcriptional errors that may result from process are unintentional.   Patient's Name: Jennifer Harmon  MRN: [id number]  Referring Provider: Ignatius Specking, MD  DOB: [birth year]  PCP: Ignatius Specking, MD  DOS: 11/05/2019  Note by: Oswaldo Done, MD  Service setting: Ambulatory outpatient  Specialty: Interventional Pain Management  Patient type: Established  Location: [facility] (AMB) Pain Management Facility  Visit type: Interventional Procedure   Primary Reason for Visit: Interventional Pain Management Treatment. CC: Back Pain (low)  Procedure:          Anesthesia, Analgesia, Anxiolysis:  Type: Trigger Point Injection (1-2 muscle groups) #1  CPT: 20552 Primary Purpose: Diagnostic Region: Posterior Lumbosacral Level: PSIS Target Area: Quadratus lumborum and erector spinae muscle Trigger Point Approach: Percutaneous, ipsilateral approach. Laterality: Bilateral Paramedial  Type: Local Anesthesia Indication(s): Analgesia         Local Anesthetic: Lidocaine 1-2% Route: Infiltration ([place]/IM) IV Access: Declined Sedation: Declined   Position: Sitting   Indications: 1. Acute postoperative pain   2. Chronic low back pain  (Secondary Area of Pain) (Bilateral) (R>L)   3. Trigger point with back pain    Pain Score: Pre-procedure: 8 /10 Post-procedure: 0-No pain/10 I personally evaluated the patient before she left and at that time we had achieved complete relief of her low back pain suggesting that in fact the pain was coming from those 2 trigger points.  Today the patient comes in with palpable trigger points over the PSIS,  bilaterally.  Pressure over those has reproduced the patient's usual pain.  To assist the patient with the discomfort I have decided to give her a Toradol/Norflex 60/60 IM injection prior to doing the trigger points.  I have also recommended that they ice the area 15 minutes on/15 minutes off for the first 24 hours, followed by heat thereafter.  To help with this, I have also instructed the patient and her husband to take some supplemental vitamin C, vitamin a, and vitamin E, as these are antioxidants that may assist in eliminating some of this pain.  Pre-op Assessment:  Jennifer Harmon is a 58 y.o. (year old), female patient, seen today for interventional treatment. She  has a past surgical history that includes Cholecystectomy; Cesarean section; Back surgery (2008); and Joint replacement (Right, 04/16/2019). [name] has a current medication list which includes the following prescription(s): baclofen, clonazepam, cyclobenzaprine, esomeprazole, latanoprost, magnesium oxide, multivitamin with minerals, mupirocin ointment, [START ON 11/17/2019] oxycodone, [START ON 12/17/2019] oxycodone, [START ON 01/16/2020] oxycodone, pregabalin, and rosuvastatin. Her primarily concern today is the Back Pain (low)  Initial Vital Signs:  Pulse/HCG Rate: 90  Temp: (!) 97.3 F (36.3 C) Resp: 18 BP: 99/86 SpO2: 99 %  BMI: Estimated body mass index is 34.72 kg/m as calculated from the following:   Height as of this encounter:  (1.6 m).   Weight as of this encounter: 196 lb (88.9 kg).  Risk Assessment: Allergies: Reviewed. She is allergic to asa [aspirin]; bupropion; and tape.  Allergy Precautions: None required Coagulopathies: Reviewed. None identified.  Blood-thinner therapy: None at this time Active Infection(s): Reviewed. None identified. Jennifer Harmon is afebrile  Site Confirmation: Jennifer Harmon was asked to confirm the procedure and laterality before marking the  site Procedure checklist: Completed Consent:  Before the procedure and under the influence of no sedative(s), amnesic(s), or anxiolytics, the patient was informed of the treatment options, risks and possible complications. To fulfill our ethical and legal obligations, as recommended by the American Medical Association's Code of Ethics, I have informed the patient of my clinical impression; the nature and purpose of the treatment or procedure; the risks, benefits, and possible complications of the intervention; the alternatives, including doing nothing; the risk(s) and benefit(s) of the alternative treatment(s) or procedure(s); and the risk(s) and benefit(s) of doing nothing. The patient was provided information about the general risks and possible complications associated with the procedure. These may include, but are not limited to: failure to achieve desired goals, infection, bleeding, organ or nerve damage, allergic reactions, paralysis, and death. In addition, the patient was informed of those risks and complications associated to the procedure, such as failure to decrease pain; infection; bleeding; organ or nerve damage with subsequent damage to sensory, motor, and/or autonomic systems, resulting in permanent pain, numbness, and/or weakness of one or several areas of the body; allergic reactions; (i.e.: anaphylactic reaction); and/or death. Furthermore, the patient was informed of those risks and complications associated with the medications. These include, but are not limited to: allergic reactions (i.e.: anaphylactic or anaphylactoid reaction(s)); adrenal axis suppression; blood sugar elevation that in diabetics may result in ketoacidosis or comma; water retention that in patients with history of congestive heart failure may result in shortness of breath, pulmonary edema, and decompensation with resultant heart failure; weight gain; swelling or edema; medication-induced neural toxicity; particulate matter embolism and blood vessel occlusion with  resultant organ, and/or nervous system infarction; and/or aseptic necrosis of one or more joints. Finally, the patient was informed that Medicine is not an exact science; therefore, there is also the possibility of unforeseen or unpredictable risks and/or possible complications that may result in a catastrophic outcome. The patient indicated having understood very clearly. We have given the patient no guarantees and we have made no promises. Enough time was given to the patient to ask questions, all of which were answered to the patient's satisfaction. Ms. Holford has indicated that she wanted to continue with the procedure. Attestation: I, the ordering provider, attest that I have discussed with the patient the benefits, risks, side-effects, alternatives, likelihood of achieving goals, and potential problems during recovery for the procedure that I have provided informed consent. Date  Time: 11/05/2019  8:04 AM  Pre-Procedure Preparation:  Monitoring: As per clinic protocol. Respiration, ETCO2, SpO2, BP, heart rate and rhythm monitor placed and checked for adequate function Safety Precautions: Patient was assessed for positional comfort and pressure points before starting the procedure. Time-out: I initiated and conducted the "Time-out" before starting the procedure, as per protocol. The patient was asked to participate by confirming the accuracy of the "Time Out" information. Verification of the correct person, site, and procedure were performed and confirmed by me, the nursing staff, and the patient. "Time-out" conducted as per Joint Commission's Universal Protocol (UP.01.01.01). Time: 0838  Description of Procedure:          Area Prepped: Entire Lower Lumbosacral Region Prepping solution: DuraPrep (Iodine Povacrylex [0.7% available iodine] and Isopropyl Alcohol, 74% w/w) Safety Precautions: Aspiration looking for blood return was conducted prior to all injections. At no point did we inject any  substances, as a needle was being advanced. No attempts were made at seeking any paresthesias. Safe injection practices and needle disposal techniques used. Medications properly checked  for expiration dates. SDV (single dose vial) medications used. Description of the Procedure: Protocol guidelines were followed. The patient was placed in position over the fluoroscopy table. The target area was identified and the area prepped in the usual manner. Skin & deeper tissues infiltrated with local anesthetic. Appropriate amount of time allowed to pass for local anesthetics to take effect. The procedure needles were then advanced to the target area. Proper needle placement secured. Negative aspiration confirmed. Solution injected in intermittent fashion, asking for systemic symptoms every 0.5cc of injectate. The needles were then removed and the area cleansed, making sure to leave some of the prepping solution back to take advantage of its long term bactericidal properties.  Vitals:   11/05/19 0803 11/05/19 0807  BP: 99/86   Pulse: 90   Resp: 18   Temp:  (!) 97.3 F (36.3 C)  SpO2: 99%   Weight: 196 lb (88.9 kg)   Height: 5\' 3"  (1.6 m)     Start Time: 0838 hrs. End Time: 0845 hrs. Materials:  Needle(s) Type: Epidural needle Gauge: 25G Length: 1.5-in Medication(s): Please see orders for medications and dosing details.  Imaging Guidance:          Type of Imaging Technique: None used Indication(s): N/A Exposure Time: No patient exposure Contrast: None used. Fluoroscopic Guidance: N/A Ultrasound Guidance: N/A Interpretation: N/A  Antibiotic Prophylaxis:   Anti-infectives (From admission, onward)   None     Indication(s): None identified  Post-operative Assessment:  Post-procedure Vital Signs:  Pulse/HCG Rate: 90  Temp: (!) 97.3 F (36.3 C) Resp: 18 BP: 99/86 SpO2: 99 %  EBL: None  Complications: No immediate post-treatment complications observed by team, or reported by  patient.  Note: The patient tolerated the entire procedure well. A repeat set of vitals were taken after the procedure and the patient was kept under observation following institutional policy, for this type of procedure. Post-procedural neurological assessment was performed, showing return to baseline, prior to discharge. The patient was provided with post-procedure discharge instructions, including a section on how to identify potential problems. Should any problems arise concerning this procedure, the patient was given instructions to immediately contact us, at any time, without hesitation. In any case, we plan to contact the patient by telephone for a follow-up status report regarding this interventional procedure.  Comments:  No additional relevant information.  Plan of Care  Orders:  Orders Placed This Encounter  Procedures  . MNB (Today)    Scheduling Instructions:     Area: Lower Back     Side: Bilateral     Sedation: None     Timeframe: Today    Order Specific Question:   Where will this procedure be performed?    Answer:   [facility]  . Consent: MNB    Provider Attestation: I, Dushaun Okey A. Laban EmperorNaveira, MD, (Pain Management Specialist), the physician/practitioner, attest that I have discussed with the patient the benefits, risks, side effects, alternatives, likelihood of achieving goals and potential problems during recovery for the procedure that I have provided informed consent.    Scheduling Instructions:     Procedure: Myoneural Block (Trigger Point injection)     Indications: Musculoskeletal pain/myofascial pain secondary to trigger point     Note: Always confirm laterality of pain with [name], before procedure.     Transcribe to consent form and obtain patient signature.  [facility] Block Tray    Equipment required: Single use, disposable, "Block Tray"    Standing Status:   Standing  Number of Occurrences:   1    Order Specific Question:   Specify    Answer:   Block  Tray   Chronic Opioid Analgesic:  Oxycodone IR 5 mg, 1 tab PO q 8 hrs (15 mg/day of oxycodone) MME/day: 22.5 mg/day.   Medications ordered for procedure: Meds ordered this encounter  Medications  . ropivacaine (PF) 2 mg/mL (0.2%) (NAROPIN) injection 4 mL  . methylPREDNISolone acetate (DEPO-MEDROL) injection 80 mg  . dexamethasone (DECADRON) injection 10 mg  . lidocaine (XYLOCAINE) 2 % (with pres) injection 80 mg  . ketorolac (TORADOL) injection 60 mg  . orphenadrine (NORFLEX) injection 60 mg   Medications administered: We administered ropivacaine (PF) 2 mg/mL (0.2%), methylPREDNISolone acetate, dexamethasone, lidocaine, ketorolac, and orphenadrine.  See the medical record for exact dosing, route, and time of administration.  Follow-up plan:   Return in about 2 weeks (around 11/19/2019) for (VV), (PP), (s/p Tests) to follow-up with results of blood work.       Interventional management options: Considering:   Diagnostic Trigger point injections Diagnostic midline caudal ESI  Possible Racz procedure (epidural Neurolysis) Diagnostic thoracic facet block Possible thoracic facet RFA Diagnostic bilateral CESI Diagnosticbilateral cervical facet block Possiblebilateral cervical facet RFA Diagnostic right IA shoulder injection Diagnostic right suprascapular NB Possibleright suprascapular RFA Diagnostic left genicular NB Possible bilateral genicular RFA   Palliative PRN treatment(s):   Palliative right T6-7 TESI #2  Palliative bilateral lumbar facet block #4  Palliative right SI block #3  Palliative right IA hip joint injections  Palliative right lumbar facet RFA #2 (last done 07/25/2019)  Palliative right SI joint RFA #2 (last done 05/03/2018)  Palliative left  lumbar facet RFA #2 (last done 09/10/2019)  Palliative right genicular NB #2     Recent Visits Date Type Provider Dept  10/23/19 Telemedicine Delano MetzNaveira, Lenon Kuennen, MD [facility]  09/10/19 Procedure  visit Delano MetzNaveira, Shiv Shuey, MD [facility]  Showing recent visits within past 90 days and meeting all other requirements   Today's Visits Date Type Provider Dept  11/05/19 Procedure visit Delano MetzNaveira, Lamir Racca, MD [facility]  Showing today's visits and meeting all other requirements   Future Appointments Date Type Provider Dept  11/18/19 Appointment Delano MetzNaveira, Starlet Gallentine, MD [facility]  12/09/19 Appointment Delano MetzNaveira, Sheccid Lahmann, MD [facility]  Showing future appointments within next 90 days and meeting all other requirements   Disposition: Discharge home  Discharge Date & Time: 11/05/2019; 0903 hrs.   Primary Care Physician: Ignatius SpeckingVyas, Dhruv B, MD [facility] Note by: Oswaldo DoneFrancisco A Tyrelle Raczka, MD Date: 11/05/2019; Time: 9:20 AM  Disclaimer:  Medicine is not an Visual merchandiserexact science. The only guarantee in medicine is that nothing is guaranteed. It is important to note that the decision to proceed with this intervention was based on the information collected from the patient. The Data and conclusions were drawn from the patient's questionnaire, the interview, and the physical examination. Because the information was provided in large part by the patient, it cannot be guaranteed that it has not been purposely or unconsciously manipulated. Every effort has been made to obtain as much relevant data as possible for this evaluation. It is important to note that the conclusions that lead to this procedure are derived in large part from the available data. Always take into account that the treatment will also be dependent on availability of resources and existing treatment guidelines, considered by other Pain Management Practitioners as being common knowledge and practice, at the time  of the intervention. For Medico-Legal purposes, it is also important to point out that variation in procedural techniques and pharmacological choices are the  acceptable norm. The indications, contraindications, technique, and results of the above procedure should only be interpreted and judged by a Board-Certified Interventional Pain Specialist with extensive familiarity and expertise in the same exact procedure and technique.

## 2019-11-05 NOTE — Progress Notes (Signed)
Safety precautions to be maintained throughout the outpatient stay will include: orient to surroundings, keep bed in low position, maintain call bell within reach at all times, provide assistance with transfer out of bed and ambulation.  

## 2019-11-05 NOTE — Patient Instructions (Addendum)
____________________________________________________________________________________________  Post-Procedure Discharge Instructions  Instructions:  Apply ice:   Purpose: This will minimize any swelling and discomfort after procedure.   When: Day of procedure, as soon as you get home.  How: Fill a plastic sandwich bag with crushed ice. Cover it with a small towel and apply to injection site.  How long: (15 min on, 15 min off) Apply for 15 minutes then remove x 15 minutes.  Repeat sequence on day of procedure, until you go to bed.  Apply heat:   Purpose: To treat any soreness and discomfort from the procedure.  When: Starting the next day after the procedure.  How: Apply heat to procedure site starting the day following the procedure.  How long: May continue to repeat daily, until discomfort goes away.  Food intake: Start with clear liquids (like water) and advance to regular food, as tolerated.   Physical activities: Keep activities to a minimum for the first 8 hours after the procedure. After that, then as tolerated.  Driving: If you have received any sedation, be responsible and do not drive. You are not allowed to drive for 24 hours after having sedation.  Blood thinner: (Applies only to those taking blood thinners) You may restart your blood thinner 6 hours after your procedure.  Insulin: (Applies only to Diabetic patients taking insulin) As soon as you can eat, you may resume your normal dosing schedule.  Infection prevention: Keep procedure site clean and dry. Shower daily and clean area with soap and water.  Post-procedure Pain Diary: Extremely important that this be done correctly and accurately. Recorded information will be used to determine the next step in treatment. For the purpose of accuracy, follow these rules:  Evaluate only the area treated. Do not report or include pain from an untreated area. For the purpose of this evaluation, ignore all other areas of pain,  except for the treated area.  After your procedure, avoid taking a long nap and attempting to complete the pain diary after you wake up. Instead, set your alarm clock to go off every hour, on the hour, for the initial 8 hours after the procedure. Document the duration of the numbing medicine, and the relief you are getting from it.  Do not go to sleep and attempt to complete it later. It will not be accurate. If you received sedation, it is likely that you were given a medication that may cause amnesia. Because of this, completing the diary at a later time may cause the information to be inaccurate. This information is needed to plan your care.  Follow-up appointment: Keep your post-procedure follow-up evaluation appointment after the procedure (usually 2 weeks for most procedures, 6 weeks for radiofrequencies). DO NOT FORGET to bring you pain diary with you.   Expect: (What should I expect to see with my procedure?)  From numbing medicine (AKA: Local Anesthetics): Numbness or decrease in pain. You may also experience some weakness, which if present, could last for the duration of the local anesthetic.  Onset: Full effect within 15 minutes of injected.  Duration: It will depend on the type of local anesthetic used. On the average, 1 to 8 hours.   From steroids (Applies only if steroids were used): Decrease in swelling or inflammation. Once inflammation is improved, relief of the pain will follow.  Onset of benefits: Depends on the amount of swelling present. The more swelling, the longer it will take for the benefits to be seen. In some cases, up to 10 days.    Duration: Steroids will stay in the system x 2 weeks. Duration of benefits will depend on multiple posibilities including persistent irritating factors.  Side-effects: If present, they may typically last 2 weeks (the duration of the steroids).  Frequent: Cramps (if they occur, drink Gatorade and take over-the-counter Magnesium 450-500 mg  once to twice a day); water retention with temporary weight gain; increases in blood sugar; decreased immune system response; increased appetite.  Occasional: Facial flushing (red, warm cheeks); mood swings; menstrual changes.  Uncommon: Long-term decrease or suppression of natural hormones; bone thinning. (These are more common with higher doses or more frequent use. This is why we prefer that our patients avoid having any injection therapies in other practices.)   Very Rare: Severe mood changes; psychosis; aseptic necrosis.  From procedure: Some discomfort is to be expected once the numbing medicine wears off. This should be minimal if ice and heat are applied as instructed.  Call if: (When should I call?)  You experience numbness and weakness that gets worse with time, as opposed to wearing off.  New onset bowel or bladder incontinence. (Applies only to procedures done in the spine)  Emergency Numbers:  Durning business hours (Monday - Thursday, 8:00 AM - 4:00 PM) (Friday, 9:00 AM - 12:00 Noon): [phone]  After hours: [phone]  NOTE: If you are having a problem and are unable connect with, or to talk to a provider, then go to your nearest urgent care or emergency department. If the problem is serious and urgent, please call 911. ____________________________________________________________________________________________   ____________________________________________________________________________________________  Pain Prevention Technique  Definition:   A technique used to minimize the effects of an activity known to cause inflammation or swelling, which in turn leads to an increase in pain.  Purpose: To prevent swelling from occurring. It is based on the fact that it is easier to prevent swelling from happening than it is to get rid of it, once it occurs.  Contraindications: 1. Anyone with allergy or hypersensitivity to the recommended medications. 2. Anyone  taking anticoagulants (Blood Thinners) (e.g., Coumadin, Warfarin, Plavix, etc.). 3. Patients in Renal Failure.  Technique: Before you undertake an activity known to cause pain, or a flare-up of your chronic pain, and before you experience any pain, do the following:  1. On a full stomach, take 4 (four) over the counter Ibuprofens 200mg  tablets (Motrin), for a total of 800 mg. 2. In addition, take over the counter Magnesium 400 to 500 mg, before doing the activity.  3. Six (6) hours later, again on a full stomach, repeat the Ibuprofen. 4. That night, take a warm shower and stretch under the running warm water.  This technique may be sufficient to abort the pain and discomfort before it happens. Keep in mind that it takes a lot less medication to prevent swelling than it takes to eliminate it once it occurs.  ____________________________________________________________________________________________  Antioxidants Take some supplemental vitamin C, vitamin A, and vitamin E, to help with their antioxidant properties.  These can be found over-the-counter (OTC) in most pharmacies.  As to the dose, take them daily, as recommended in the bottle.  No additional special instructions other than to supplement her diet with these, for at least the next 3 to 4 weeks.

## 2019-11-06 ENCOUNTER — Telehealth: Payer: Self-pay

## 2019-11-06 NOTE — Telephone Encounter (Signed)
Post procedure phone call.   

## 2019-11-11 DIAGNOSIS — Z299 Encounter for prophylactic measures, unspecified: Secondary | ICD-10-CM | POA: Diagnosis not present

## 2019-11-11 DIAGNOSIS — I10 Essential (primary) hypertension: Secondary | ICD-10-CM | POA: Diagnosis not present

## 2019-11-11 DIAGNOSIS — Z6835 Body mass index (BMI) 35.0-35.9, adult: Secondary | ICD-10-CM | POA: Diagnosis not present

## 2019-11-11 DIAGNOSIS — F419 Anxiety disorder, unspecified: Secondary | ICD-10-CM | POA: Diagnosis not present

## 2019-11-11 DIAGNOSIS — G4733 Obstructive sleep apnea (adult) (pediatric): Secondary | ICD-10-CM | POA: Diagnosis not present

## 2019-11-11 DIAGNOSIS — E785 Hyperlipidemia, unspecified: Secondary | ICD-10-CM | POA: Diagnosis not present

## 2019-11-14 ENCOUNTER — Encounter: Payer: Self-pay | Admitting: Pain Medicine

## 2019-11-14 NOTE — Progress Notes (Signed)
Pain relief after procedure (treated area only): (Questions asked to patient) 1. Starting about 15 minutes after the procedure, and "while the area was still numb" (from the local anesthetics), were you having any of your usual pain "in that area" (the treated area)?  (NOTE: NOT including the discomfort from the needle sticks.) First 1 hour: 100 % better. First 4-6 hours: 100 % better. 2. How long did the numbness from the local anesthetics last? (More than 6 hours?) Duration: 12 hours.  3. How much better is your pain now, when compared to before the procedure? Current benefit: 50 % better. 4. Can you move better now? Improvement in ROM (Range of Motion): Yes. 5. Can you do more now? Improvement in function: Yes. 4. Did you have any problems with the procedure? Side-effects/Complications: No.

## 2019-11-17 NOTE — Progress Notes (Signed)
Patient: Jennifer Harmon  Service Category: E/M  Provider: Oswaldo Done, MD  DOB: 05-Aug-1961  DOS: 11/18/2019  Referring Provider: Ignatius Specking, MD  MRN: [id number]  Setting: Ambulatory outpatient  PCP: Ignatius Specking, MD  Type: Established Patient  Specialty: Interventional Pain Management    Location: External (at her PCPs office)  Delivery: TeleHealth     Virtual Encounter - Pain Management PROVIDER NOTE: Information contained herein reflects review and annotations entered in association with encounter. Interpretation of such information and data should be left to medically-trained personnel. Information provided to patient can be located elsewhere in the medical record under "Patient Instructions". Document created using STT-dictation technology, any transcriptional errors that may result from process are unintentional.    Contact & Pharmacy Preferred: [phone] Home: [phone] (home) Mobile: [phone] (mobile) E-mail: No e-mail address on record  [facility] - [place], [place] - [address] [place] [address] Phone: [phone] Fax: [phone]   Pre-screening  Jennifer Harmon offered "in-person" vs "virtual" encounter. She indicated preferring virtual for this encounter.   Reason COVID-19*  Social distancing based on CDC and AMA recommendations.   I contacted Jennifer Harmon on 11/18/2019 via telephone.      I clearly identified myself as Oswaldo Done, MD. I verified that I was speaking with the correct person using two identifiers (Name: Jennifer Harmon, and date of birth: 28-May-1961).  Consent I sought verbal advanced consent from Jennifer Harmon for virtual visit interactions. I informed Jennifer Harmon of possible security and privacy concerns, risks, and limitations associated with providing "not-in-person" medical evaluation and management services. I also informed Jennifer Harmon of the availability of "in-person" appointments. Finally,  I informed her that there would be a charge for the virtual visit and that she could be  personally, fully or partially, financially responsible for it. Jennifer Harmon expressed understanding and agreed to proceed.   Historic Elements   Jennifer Harmon is a 59 y.o. year old, female patient evaluated today after her last encounter by our practice on 11/06/2019. Jennifer Harmon  has a past medical history of Acute postoperative pain (05/03/2018), Anxiety, Chronic chest pain, Depression, DJD (degenerative joint disease), Fibromyalgia, GERD (gastroesophageal reflux disease), and Hypercholesterolemia. She also  has a past surgical history that includes Cholecystectomy; Cesarean section; Back surgery (2008); and Joint replacement (Right, 04/16/2019). Jennifer Harmon has a current medication list which includes the following prescription(s): baclofen, clonazepam, cyclobenzaprine, esomeprazole, latanoprost, magnesium oxide, multivitamin with minerals, mupirocin ointment, oxycodone, [START ON 12/17/2019] oxycodone, [START ON 01/16/2020] oxycodone, pregabalin, rosuvastatin, calcium carbonate, vitamin d3, and ergocalciferol. She  reports that she quit smoking about 4 years ago. Her smoking use included pipe. She has a 10.00 pack-year smoking history. She uses smokeless tobacco. She reports that she does not drink alcohol or use drugs. Jennifer Harmon is allergic to asa [aspirin]; bupropion; and tape.   HPI  Today, she is being contacted for a post-procedure assessment.  Today we also took the opportunity to review the lab work ordered on the last visit.  According to his lab work, the patient seems to have a vitamin D insufficiency, I will otherwise everything seems to be within normal limits.   Post-Procedure Evaluation  Procedure: Diagnostic/therapeutic bilateral Quadratus lumborum and erector spinae muscle Trigger Point injection #1, no fluoroscopy or IV sedation Pre-procedure pain level:  8/10 Post-procedure: 0/10 (100%  relief)  Sedation: None.  Jennifer Harmon, Jennifer Harmon  11/14/2019  12:54 PM  Sign when Signing Visit Pain relief after procedure (treated area only): (Questions asked to patient) 1. Starting about 15 minutes after the procedure, and "while the area was still numb" (from the local anesthetics), were you having any of your usual pain "in that area" (the treated area)?  (NOTE: NOT including the discomfort from the needle sticks.) First 1 hour: 100 % better. First 4-6 hours: 100 % better. 2. How long did the numbness from the local anesthetics last? (More than 6 hours?) Duration: 12 hours.  3. How much better is your pain now, when compared to before the procedure? Current benefit: 50 % better. 4. Can you move better now? Improvement in ROM (Range of Motion): Yes. 5. Can you do more now? Improvement in function: Yes. 4. Did you have any problems with the procedure? Side-effects/Complications: No.  Current benefits: Defined as benefit that persist at this time.   Analgesia:  50% improved Function: Jennifer Harmon reports improvement in function ROM: Jennifer Harmon reports improvement in ROM  Pharmacotherapy Assessment  Analgesic: Oxycodone IR 5 mg, 1 tab PO q 8 hrs (15 mg/day of oxycodone) MME/day: 22.5 mg/day.   Monitoring: Pharmacotherapy: No side-effects or adverse reactions reported. [place] PMP: PDMP reviewed during this encounter.       Compliance: No problems identified. Effectiveness: Clinically acceptable. Plan: Refer to "POC".  UDS:  Summary  Date Value Ref Range Status  12/24/2018 FINAL  Final    Comment:    ==================================================================== TOXASSURE SELECT 13 (MW) ==================================================================== Test                             Result       Flag       Units Drug Present and Declared for Prescription Verification   7-aminoclonazepam              315          EXPECTED   ng/mg creat    7-aminoclonazepam is an  expected metabolite of clonazepam. Source    of clonazepam is a scheduled prescription medication.   Oxycodone                      221          EXPECTED   ng/mg creat   Oxymorphone                    247          EXPECTED   ng/mg creat   Noroxycodone                   573          EXPECTED   ng/mg creat   Noroxymorphone                 111          EXPECTED   ng/mg creat    Sources of oxycodone are scheduled prescription medications.    Oxymorphone, noroxycodone, and noroxymorphone are expected    metabolites of oxycodone. Oxymorphone is also available as a    scheduled prescription medication. ==================================================================== Test                      Result    Flag   Units      Ref Range   Creatinine  112              mg/dL      >=20 ==================================================================== Declared Medications:  The flagging and interpretation on this report are based on the  following declared medications.  Unexpected results may arise from  inaccuracies in the declared medications.  **Note: The testing scope of this panel includes these medications:  Clonazepam  Oxycodone  **Note: The testing scope of this panel does not include following  reported medications:  Citalopram  Cyclobenzaprine  Latanoprost  Magnesium Oxide  Multivitamin  Pregabalin  Ranitidine  Rosuvastatin ==================================================================== For clinical consultation, please call [phone]. ====================================================================    Laboratory Chemistry Profile (12 mo)  Renal: 11/05/2019: BUN 12; Creatinine, Ser 0.53  Lab Results  Component Value Date   GFRAA >60 11/05/2019   GFRNONAA >60 11/05/2019   Hepatic: 11/05/2019: Albumin 4.0 Lab Results  Component Value Date   AST 17 11/05/2019   ALT 20 11/05/2019   Other: 11/05/2019: CRP 0.6; Sed Rate 21; Vit D, 25-Hydroxy 21.15;  Vitamin B-12 281 Note: Above Lab results reviewed.  Imaging  DG PAIN CLINIC C-ARM 1-60 MIN NO REPORT Fluoro was used, but no Radiologist interpretation will be provided.  Please refer to "NOTES" tab for provider progress note.   Assessment  The primary encounter diagnosis was Chronic pain syndrome. Diagnoses of Chronic low back pain  (Secondary Area of Pain) (Bilateral) (R>L), Chronic upper back pain (Primary Area of Pain) (Bilateral) (R>L), Chronic neck pain (Tertiary Area of Pain) (Bilateral) (R>L), Chronic knee pain (Fourth Area of Pain) (Bilateral) (R>L), Lumbar facet syndrome (Bilateral) (R>L), DISH (diffuse idiopathic skeletal hyperostosis), Vitamin D insufficiency, and Trigger point with back pain were also pertinent to this visit.  Plan of Care  Problem-specific:  No problem-specific Assessment & Plan notes found for this encounter.  I am having Jennifer Harmon. Jennifer Harmon start on ergocalciferol, Vitamin D3, and calcium carbonate. I am also having her maintain her clonazePAM, latanoprost, rosuvastatin, multivitamin with minerals, mupirocin ointment, cyclobenzaprine, baclofen, Magnesium Oxide, esomeprazole, oxyCODONE, oxyCODONE, oxyCODONE, and pregabalin.  Pharmacotherapy (Medications Ordered): Meds ordered this encounter  Medications  . ergocalciferol (VITAMIN D2) 1.25 MG (50000 UT) capsule    Sig: Take 1 capsule (50,000 Units total) by mouth 2 (two) times a week. X 6 weeks.    Dispense:  12 capsule    Refill:  0    Fill one day early if pharmacy is closed on scheduled refill date. May substitute for generic if available.  . Cholecalciferol (VITAMIN D3) 125 MCG (5000 UT) CAPS    Sig: Take 1 capsule (5,000 Units total) by mouth daily with breakfast. Take along with calcium and magnesium.    Dispense:  30 capsule    Refill:  5    Fill one day early if pharmacy is closed on scheduled refill date. May substitute for generic if available. May substitute with similar over-the-counter product.   . calcium carbonate (CALCIUM 600) 600 MG TABS tablet    Sig: Take 1 tablet (600 mg total) by mouth 2 (two) times daily with a meal.    Dispense:  60 tablet    Refill:  5    Fill one day early if pharmacy is closed on scheduled refill date. May substitute for generic if available.   Orders:  Orders Placed This Encounter  Procedures  . MNB (PRN)    Standing Status:   Standing    Number of Occurrences:   6    Standing Expiration Date:   11/17/2020  Scheduling Instructions:     Area: Lower Back     Side: Bilateral     Sedation: None     TIMEFRAME: PRN procedure. (Ms. Rhude will call when needed.)    Order Specific Question:   Where will this procedure be performed?    Answer:   [facility]   Follow-up plan:   Return in about 3 months (around 02/12/2020) for (VV), (MM), in addition, PRN Procedure(s): (B) Lower Back MNB/TPI #2 (no sedation required).      Interventional management options: Considering:   Diagnostic Trigger point injections Diagnostic midline caudal ESI  Possible Racz procedure (epidural Neurolysis) Diagnostic thoracic facet block Possible thoracic facet RFA Diagnostic bilateral CESI Diagnosticbilateral cervical facet block Possiblebilateral cervical facet RFA Diagnostic right IA shoulder injection Diagnostic right suprascapular NB Possibleright suprascapular RFA Diagnostic left genicular NB Possible bilateral genicular RFA   Palliative PRN treatment(s):   Palliative right T6-7 TESI #2  Palliative bilateral lumbar facet block #4  Palliative right SI block #3  Palliative right IA hip joint injections  Palliative right lumbar facet RFA #2 (last done 07/25/2019)  Palliative right SI joint RFA #2 (last done 05/03/2018)  Palliative left  lumbar facet RFA #2 (last done 09/10/2019)  Palliative right genicular NB #2     Recent Visits Date Type Provider Dept  11/05/19 Procedure visit Delano Metz, MD [facility]  10/23/19  Telemedicine Delano Metz, MD [facility]  09/10/19 Procedure visit Delano Metz, MD [facility]  Showing recent visits within past 90 days and meeting all other requirements   Today's Visits Date Type Provider Dept  11/18/19 Telemedicine Delano Metz, MD [facility]  Showing today's visits and meeting all other requirements   Future Appointments Date Type Provider Dept  12/09/19 Appointment Delano Metz, MD [facility]  02/12/20 Appointment Delano Metz, MD [facility]  Showing future appointments within next 90 days and meeting all other requirements   I discussed the assessment and treatment plan with the patient. The patient was provided an opportunity to ask questions and all were answered. The patient agreed with the plan and demonstrated an understanding of the instructions.  Patient advised to call back or seek an in-person evaluation if the symptoms or condition worsens.  Total duration of non-face-to-face encounter: 13 minutes.  Note by: Oswaldo Done, MD Date: 11/18/2019; Time: 12:41 PM

## 2019-11-18 ENCOUNTER — Ambulatory Visit: Payer: PPO | Attending: Pain Medicine | Admitting: Pain Medicine

## 2019-11-18 ENCOUNTER — Other Ambulatory Visit: Payer: Self-pay

## 2019-11-18 DIAGNOSIS — M549 Dorsalgia, unspecified: Secondary | ICD-10-CM

## 2019-11-18 DIAGNOSIS — G894 Chronic pain syndrome: Secondary | ICD-10-CM | POA: Diagnosis not present

## 2019-11-18 DIAGNOSIS — Z299 Encounter for prophylactic measures, unspecified: Secondary | ICD-10-CM | POA: Diagnosis not present

## 2019-11-18 DIAGNOSIS — H6121 Impacted cerumen, right ear: Secondary | ICD-10-CM | POA: Diagnosis not present

## 2019-11-18 DIAGNOSIS — M25561 Pain in right knee: Secondary | ICD-10-CM | POA: Diagnosis not present

## 2019-11-18 DIAGNOSIS — M6530 Trigger finger, unspecified finger: Secondary | ICD-10-CM

## 2019-11-18 DIAGNOSIS — G8929 Other chronic pain: Secondary | ICD-10-CM

## 2019-11-18 DIAGNOSIS — E559 Vitamin D deficiency, unspecified: Secondary | ICD-10-CM | POA: Diagnosis not present

## 2019-11-18 DIAGNOSIS — M47816 Spondylosis without myelopathy or radiculopathy, lumbar region: Secondary | ICD-10-CM | POA: Diagnosis not present

## 2019-11-18 DIAGNOSIS — M542 Cervicalgia: Secondary | ICD-10-CM

## 2019-11-18 DIAGNOSIS — M4810 Ankylosing hyperostosis [Forestier], site unspecified: Secondary | ICD-10-CM | POA: Diagnosis not present

## 2019-11-18 DIAGNOSIS — M25562 Pain in left knee: Secondary | ICD-10-CM | POA: Diagnosis not present

## 2019-11-18 DIAGNOSIS — Z6834 Body mass index (BMI) 34.0-34.9, adult: Secondary | ICD-10-CM | POA: Diagnosis not present

## 2019-11-18 DIAGNOSIS — I10 Essential (primary) hypertension: Secondary | ICD-10-CM | POA: Diagnosis not present

## 2019-11-18 DIAGNOSIS — M5441 Lumbago with sciatica, right side: Secondary | ICD-10-CM

## 2019-11-18 DIAGNOSIS — M545 Low back pain: Secondary | ICD-10-CM

## 2019-11-18 DIAGNOSIS — Z87891 Personal history of nicotine dependence: Secondary | ICD-10-CM | POA: Diagnosis not present

## 2019-11-18 MED ORDER — ERGOCALCIFEROL 1.25 MG (50000 UT) PO CAPS: 50000.0000 [IU] | ORAL_CAPSULE | ORAL | 0 refills | Status: AC

## 2019-11-18 MED ORDER — CALCIUM CARBONATE 600 MG PO TABS: 600.0000 mg | ORAL_TABLET | Freq: Two times a day (BID) | ORAL | 5 refills | Status: DC

## 2019-11-18 MED ORDER — VITAMIN D3 125 MCG (5000 UT) PO CAPS: 1.0000 | ORAL_CAPSULE | Freq: Every day | ORAL | 5 refills | Status: AC

## 2019-11-18 NOTE — Patient Instructions (Signed)
____________________________________________________________________________________________  Preparing for your procedure (without sedation)  Procedure appointments are limited to planned procedures: . No Prescription Refills. . No disability issues will be discussed. . No medication changes will be discussed.  Instructions: . Oral Intake: Do not eat or drink anything for at least 3 hours prior to your procedure. . Transportation: Unless otherwise stated by your physician, you may drive yourself after the procedure. . Blood Pressure Medicine: Take your blood pressure medicine with a sip of water the morning of the procedure. . Blood thinners: Notify our staff if you are taking any blood thinners. Depending on which one you take, there will be specific instructions on how and when to stop it. . Diabetics on insulin: Notify the staff so that you can be scheduled 1st case in the morning. If your diabetes requires high dose insulin, take only  of your normal insulin dose the morning of the procedure and notify the staff that you have done so. . Preventing infections: Shower with an antibacterial soap the morning of your procedure.  . Build-up your immune system: Take 1000 mg of Vitamin C with every meal (3 times a day) the day prior to your procedure. . Antibiotics: Inform the staff if you have a condition or reason that requires you to take antibiotics before dental procedures. . Pregnancy: If you are pregnant, call and cancel the procedure. . Sickness: If you have a cold, fever, or any active infections, call and cancel the procedure. . Arrival: You must be in the facility at least 30 minutes prior to your scheduled procedure. . Children: Do not bring any children with you. . Dress appropriately: Bring dark clothing that you would not mind if they get stained. . Valuables: Do not bring any jewelry or valuables.  Reasons to call and reschedule or cancel your procedure: (Following these  recommendations will minimize the risk of a serious complication.) . Surgeries: Avoid having procedures within 2 weeks of any surgery. (Avoid for 2 weeks before or after any surgery). . Flu Shots: Avoid having procedures within 2 weeks of a flu shots or . (Avoid for 2 weeks before or after immunizations). . Barium: Avoid having a procedure within 7-10 days after having had a radiological study involving the use of radiological contrast. (Myelograms, Barium swallow or enema study). . Heart attacks: Avoid any elective procedures or surgeries for the initial 6 months after a "Myocardial Infarction" (Heart Attack). . Blood thinners: It is imperative that you stop these medications before procedures. Let us know if you if you take any blood thinner.  . Infection: Avoid procedures during or within two weeks of an infection (including chest colds or gastrointestinal problems). Symptoms associated with infections include: Localized redness, fever, chills, night sweats or profuse sweating, burning sensation when voiding, cough, congestion, stuffiness, runny nose, sore throat, diarrhea, nausea, vomiting, cold or Flu symptoms, recent or current infections. It is specially important if the infection is over the area that we intend to treat. . Heart and lung problems: Symptoms that may suggest an active cardiopulmonary problem include: cough, chest pain, breathing difficulties or shortness of breath, dizziness, ankle swelling, uncontrolled high or unusually low blood pressure, and/or palpitations. If you are experiencing any of these symptoms, cancel your procedure and contact your primary care physician for an evaluation.  Remember:  Regular Business hours are:  Monday to Thursday 8:00 AM to 4:00 PM  Provider's Schedule: Nickole Adamek, MD:  Procedure days: Tuesday and Thursday 7:30 AM to 4:00 PM  Bilal   Lateef, MD:  Procedure days: Monday and Wednesday 7:30 AM to 4:00  PM ____________________________________________________________________________________________    

## 2019-12-04 ENCOUNTER — Encounter: Payer: Self-pay | Admitting: Pain Medicine

## 2019-12-08 NOTE — Progress Notes (Signed)
Patient: Jennifer Harmon  Service Category: E/M  Provider: Oswaldo Done, MD  DOB: 11/17/1960  DOS: 12/09/2019  Location: Office  MRN: [id number]  Setting: Ambulatory outpatient  Referring Provider: Ignatius Specking, MD  Type: Established Patient  Specialty: Interventional Pain Management  PCP: Ignatius Specking, MD  Location: Remote location  Delivery: TeleHealth     Virtual Encounter - Pain Management PROVIDER NOTE: Information contained herein reflects review and annotations entered in association with encounter. Interpretation of such information and data should be left to medically-trained personnel. Information provided to patient can be located elsewhere in the medical record under "Patient Instructions". Document created using STT-dictation technology, any transcriptional errors that may result from process are unintentional.    Contact & Pharmacy Preferred: [phone] Home: [phone] (home) Mobile: [phone] (mobile) E-mail: No e-mail address on record  [facility] - [place], [place] - [address] [place] [address] Phone: [phone] Fax: [phone]   Pre-screening  Jennifer Harmon offered "in-person" vs "virtual" encounter. She indicated preferring virtual for this encounter.   Reason COVID-19*  Social distancing based on CDC and AMA recommendations.   I contacted Jennifer Harmon on 12/09/2019 via telephone.      I clearly identified myself as Oswaldo Done, MD. I verified that I was speaking with the correct person using two identifiers (Name: Jennifer Harmon, and date of birth: 02-07-1961).  Consent I sought verbal advanced consent from Jennifer Harmon for virtual visit interactions. I informed Jennifer Harmon of possible security and privacy concerns, risks, and limitations associated with providing "not-in-person" medical evaluation and management services. I also informed Jennifer Harmon of the availability of "in-person" appointments. Finally,  I informed her that there would be a charge for the virtual visit and that she could be  personally, fully or partially, financially responsible for it. Jennifer Harmon expressed understanding and agreed to proceed.   Historic Elements   Jennifer Harmon is a 59 y.o. year old, female patient evaluated today after her last encounter by our practice on 11/06/2019. Jennifer Harmon  has a past medical history of Acute postoperative pain (05/03/2018), Anxiety, Chronic chest pain, Depression, DJD (degenerative joint disease), Fibromyalgia, GERD (gastroesophageal reflux disease), and Hypercholesterolemia. She also  has a past surgical history that includes Cholecystectomy; Cesarean section; Back surgery (2008); and Joint replacement (Right, 04/16/2019). Jennifer Harmon has a current medication list which includes the following prescription(s): baclofen, calcium carbonate, vitamin d3, clonazepam, cyclobenzaprine, ergocalciferol, esomeprazole, latanoprost, magnesium oxide, multivitamin with minerals, mupirocin ointment, [START ON 12/17/2019] oxycodone, [START ON 01/16/2020] oxycodone, [START ON 02/15/2020] oxycodone, pregabalin, and rosuvastatin. She  reports that she quit smoking about 4 years ago. Her smoking use included pipe. She has a 10.00 pack-year smoking history. She uses smokeless tobacco. She reports that she does not drink alcohol or use drugs. Jennifer Harmon is allergic to asa [aspirin]; bupropion; and tape.   HPI  Today, she is being contacted for follow-up evaluation.  The patient indicates being very depressed.  She was crying through the entire encounter.  She indicates that the trigger point injections that we did last time provided her with good relief of the pain for several weeks, but when she went back to cleaning the hallux, but pain recurred.  She describes this pain as being in the lower back, bilaterally with the right being worse than the left.  She also describes a pain as going towards her thighs and  buttocks  area.  She already had a bilateral lumbar facet radiofrequency ablation with some return of the lower back pain, which we identified was secondary to a trigger point.  We did inject this trigger point and this improved the pain, but it has returned again.  At this point I believe it to be necessary for her to undergo a follow-up MRI since we have tried several alternatives to treat this pain, but it seems to continue going back despite all the interventional therapies.  Should the MRI demonstrate her to have primarily epidural fibrosis with no surgically correctable lesion, we will consider the possibility of doing a diagnostic caudal epidural steroid injection/epidurogram.  If we can identify and correlate the epidural fibrosis to her low back and buttocks pain, she may be an adequate candidate for a Racz procedure.  However, before we go there I need to know if were doing with a recurrence of a surgical lesion.  Pharmacotherapy Assessment  Analgesic: Oxycodone IR 5 mg, 1 tab PO q 8 hrs (15 mg/day of oxycodone) MME/day: 22.5 mg/day.   Monitoring: Pharmacotherapy: No side-effects or adverse reactions reported. [place] PMP: PDMP reviewed during this encounter.       Compliance: No problems identified. Effectiveness: Clinically acceptable. Plan: Refer to "POC".  UDS:  Summary  Date Value Ref Range Status  12/24/2018 FINAL  Final    Comment:    ==================================================================== TOXASSURE SELECT 13 (MW) ==================================================================== Test                             Result       Flag       Units Drug Present and Declared for Prescription Verification   7-aminoclonazepam              315          EXPECTED   ng/mg creat    7-aminoclonazepam is an expected metabolite of clonazepam. Source    of clonazepam is a scheduled prescription medication.   Oxycodone                      221          EXPECTED   ng/mg creat    Oxymorphone                    247          EXPECTED   ng/mg creat   Noroxycodone                   573          EXPECTED   ng/mg creat   Noroxymorphone                 111          EXPECTED   ng/mg creat    Sources of oxycodone are scheduled prescription medications.    Oxymorphone, noroxycodone, and noroxymorphone are expected    metabolites of oxycodone. Oxymorphone is also available as a    scheduled prescription medication. ==================================================================== Test                      Result    Flag   Units      Ref Range   Creatinine              112              mg/dL      >=  20 ==================================================================== Declared Medications:  The flagging and interpretation on this report are based on the  following declared medications.  Unexpected results may arise from  inaccuracies in the declared medications.  **Note: The testing scope of this panel includes these medications:  Clonazepam  Oxycodone  **Note: The testing scope of this panel does not include following  reported medications:  Citalopram  Cyclobenzaprine  Latanoprost  Magnesium Oxide  Multivitamin  Pregabalin  Ranitidine  Rosuvastatin ==================================================================== For clinical consultation, please call [phone]. ====================================================================    Laboratory Chemistry Profile (12 mo)  Renal: 11/05/2019: BUN 12; Creatinine, Ser 0.53  Lab Results  Component Value Date   GFRAA >60 11/05/2019   GFRNONAA >60 11/05/2019   Hepatic: 11/05/2019: Albumin 4.0 Lab Results  Component Value Date   AST 17 11/05/2019   ALT 20 11/05/2019   Other: 11/05/2019: CRP 0.6; Sed Rate 21; Vit D, 25-Hydroxy 21.15; Vitamin B-12 281  Note: Above Lab results reviewed.  Imaging  DG PAIN CLINIC C-ARM 1-60 MIN NO REPORT Fluoro was used, but no Radiologist interpretation will be  provided.  Please refer to "NOTES" tab for provider progress note.   Assessment  The primary encounter diagnosis was Chronic pain syndrome. Diagnoses of Chronic upper back pain (Primary Area of Pain) (Bilateral) (R>L), Chronic low back pain  (Secondary Area of Pain) (Bilateral) (R>L), Chronic neck pain (Tertiary Area of Pain) (Bilateral) (R>L), Chronic knee pain (Fourth Area of Pain) (Bilateral) (R>L), Other intervertebral disc degeneration, lumbar region, Situational depression, DISH (diffuse idiopathic skeletal hyperostosis), DDD (degenerative disc disease), lumbar, Chronic lower extremity pain (Bilateral) (R>L), Chronic lower extremity radicular pain (L5) (Right), Failed back surgical syndrome, and Lumbar facet syndrome (Bilateral) (R>L) were also pertinent to this visit.  Plan of Care  Problem-specific:  No problem-specific Assessment & Plan notes found for this encounter.  I am having Jennifer Harmon. Jennifer Harmon maintain her clonazePAM, latanoprost, rosuvastatin, multivitamin with minerals, mupirocin ointment, cyclobenzaprine, baclofen, Magnesium Oxide, esomeprazole, oxyCODONE, oxyCODONE, pregabalin, ergocalciferol, Vitamin D3, calcium carbonate, and oxyCODONE.  Pharmacotherapy (Medications Ordered): Meds ordered this encounter  Medications  . oxyCODONE (OXY IR/ROXICODONE) 5 MG immediate release tablet    Sig: Take 1 tablet (5 mg total) by mouth every 8 (eight) hours as needed for severe pain. Must last 30 days    Dispense:  90 tablet    Refill:  0    Chronic Pain: STOP Act (Not applicable) Fill 1 day early if closed on refill date. Do not fill until: 02/15/2020. To last until: 03/16/2020. Avoid benzodiazepines within 8 hours of opioids   Orders:  Orders Placed This Encounter  Procedures  . MR LUMBAR SPINE W WO CONTRAST    Patient presents with axial pain with possible radicular component.  In addition to any acute findings, please report on:  1. Facet (Zygapophyseal) joint DJD (Hypertrophy,  space narrowing, subchondral sclerosis, and/or osteophyte formation) 2. DDD and/or IVDD (Loss of disc height, desiccation or "Black disc disease") 3. Pars defects 4. Spondylolisthesis, spondylosis, and/or spondyloarthropathies (include Degree/Grade of displacement in mm) 5. Vertebral body Fractures, including age (old, new/acute) 6. Modic Type Changes 7. Demineralization 8. Bone pathology 9. Central, Lateral Recess, and/or Foraminal Stenosis (include AP diameter of stenosis in mm) 10. Surgical changes (hardware type, status, and presence of fibrosis) NOTE: Please specify level(s) and laterality.    Standing Status:   Future    Standing Expiration Date:   03/07/2020    Order Specific Question:   If indicated for the ordered procedure, I authorize  the administration of contrast media per Radiology protocol    Answer:   Yes    Order Specific Question:   What is the patient's sedation requirement?    Answer:   No Sedation    Order Specific Question:   Does the patient have a pacemaker or implanted devices?    Answer:   No    Order Specific Question:   Call Results- Best Contact Number?    Answer:   [phone] [facility])    Order Specific Question:   Radiology Contrast Protocol - do NOT remove file path    Answer:   [email or web address]    Order Specific Question:   Preferred imaging location?    Answer:   [facility] (table limit-350lbs)  . Ambulatory referral to Psychiatry    Referral Priority:   Routine    Referral Type:   Psychiatric    Referral Reason:   Specialty Services Required    Requested Specialty:   Psychiatry    Number of Visits Requested:   1   Follow-up plan:   Return in about 3 months (around 03/04/2020) for (VV), (MM).      Interventional management options: Considering:   Diagnostic Trigger point injections Diagnostic midline caudal ESI  Possible Racz procedure (epidural Neurolysis) Diagnostic thoracic facet  block Possible thoracic facet RFA Diagnostic bilateral CESI Diagnosticbilateral cervical facet block Possiblebilateral cervical facet RFA Diagnostic right IA shoulder injection Diagnostic right suprascapular NB Possibleright suprascapular RFA Diagnostic left genicular NB Possible bilateral genicular RFA   Palliative PRN treatment(s):   Palliative right T6-7 TESI #2  Palliative bilateral lumbar facet block #4  Palliative right SI block #3  Palliative right IA hip joint injections  Palliative right lumbar facet RFA #2 (last done 07/25/2019)  Palliative right SI joint RFA #2 (last done 05/03/2018)  Palliative left  lumbar facet RFA #2 (last done 09/10/2019)  Palliative right genicular NB #2     Recent Visits Date Type Provider Dept  11/18/19 Telemedicine Delano Metz, MD [facility]  11/05/19 Procedure visit Delano Metz, MD [facility]  10/23/19 Telemedicine Delano Metz, MD [facility]  09/10/19 Procedure visit Delano Metz, MD [facility]  Showing recent visits within past 90 days and meeting all other requirements   Today's Visits Date Type Provider Dept  12/09/19 Telemedicine Delano Metz, MD [facility]  Showing today's visits and meeting all other requirements   Future Appointments Date Type Provider Dept  02/12/20 Appointment Delano Metz, MD [facility]  Showing future appointments within next 90 days and meeting all other requirements   I discussed the assessment and treatment plan with the patient. The patient was provided an opportunity to ask questions and all were answered. The patient agreed with the plan and demonstrated an understanding of the instructions.  Patient advised to call back or seek an in-person evaluation if the symptoms or condition worsens.  Duration of encounter: 18 minutes.  Note by: Oswaldo Done, MD Date: 12/09/2019; Time: 11:37  AM

## 2019-12-09 ENCOUNTER — Ambulatory Visit: Payer: PPO | Attending: Pain Medicine | Admitting: Pain Medicine

## 2019-12-09 ENCOUNTER — Other Ambulatory Visit: Payer: Self-pay

## 2019-12-09 DIAGNOSIS — M4810 Ankylosing hyperostosis [Forestier], site unspecified: Secondary | ICD-10-CM

## 2019-12-09 DIAGNOSIS — M4726 Other spondylosis with radiculopathy, lumbar region: Secondary | ICD-10-CM | POA: Diagnosis not present

## 2019-12-09 DIAGNOSIS — M545 Low back pain: Secondary | ICD-10-CM | POA: Diagnosis not present

## 2019-12-09 DIAGNOSIS — M549 Dorsalgia, unspecified: Secondary | ICD-10-CM

## 2019-12-09 DIAGNOSIS — M25562 Pain in left knee: Secondary | ICD-10-CM

## 2019-12-09 DIAGNOSIS — F4321 Adjustment disorder with depressed mood: Secondary | ICD-10-CM | POA: Insufficient documentation

## 2019-12-09 DIAGNOSIS — M79604 Pain in right leg: Secondary | ICD-10-CM | POA: Diagnosis not present

## 2019-12-09 DIAGNOSIS — M79605 Pain in left leg: Secondary | ICD-10-CM | POA: Diagnosis not present

## 2019-12-09 DIAGNOSIS — M25561 Pain in right knee: Secondary | ICD-10-CM | POA: Diagnosis not present

## 2019-12-09 DIAGNOSIS — G894 Chronic pain syndrome: Secondary | ICD-10-CM

## 2019-12-09 DIAGNOSIS — M5410 Radiculopathy, site unspecified: Secondary | ICD-10-CM

## 2019-12-09 DIAGNOSIS — M47816 Spondylosis without myelopathy or radiculopathy, lumbar region: Secondary | ICD-10-CM

## 2019-12-09 DIAGNOSIS — M5116 Intervertebral disc disorders with radiculopathy, lumbar region: Secondary | ICD-10-CM | POA: Diagnosis not present

## 2019-12-09 DIAGNOSIS — M961 Postlaminectomy syndrome, not elsewhere classified: Secondary | ICD-10-CM

## 2019-12-09 DIAGNOSIS — M5136 Other intervertebral disc degeneration, lumbar region: Secondary | ICD-10-CM

## 2019-12-09 DIAGNOSIS — G8929 Other chronic pain: Secondary | ICD-10-CM

## 2019-12-09 DIAGNOSIS — M542 Cervicalgia: Secondary | ICD-10-CM | POA: Diagnosis not present

## 2019-12-09 MED ORDER — OXYCODONE HCL 5 MG PO TABS: 5.0000 mg | ORAL_TABLET | Freq: Three times a day (TID) | ORAL | 0 refills | Status: DC | PRN

## 2019-12-18 DIAGNOSIS — R2232 Localized swelling, mass and lump, left upper limb: Secondary | ICD-10-CM | POA: Diagnosis not present

## 2019-12-18 DIAGNOSIS — N644 Mastodynia: Secondary | ICD-10-CM | POA: Diagnosis not present

## 2019-12-18 DIAGNOSIS — Z01419 Encounter for gynecological examination (general) (routine) without abnormal findings: Secondary | ICD-10-CM | POA: Diagnosis not present

## 2019-12-25 DIAGNOSIS — N644 Mastodynia: Secondary | ICD-10-CM | POA: Diagnosis not present

## 2019-12-25 DIAGNOSIS — R928 Other abnormal and inconclusive findings on diagnostic imaging of breast: Secondary | ICD-10-CM | POA: Diagnosis not present

## 2019-12-25 DIAGNOSIS — R2232 Localized swelling, mass and lump, left upper limb: Secondary | ICD-10-CM | POA: Diagnosis not present

## 2020-01-13 DIAGNOSIS — F419 Anxiety disorder, unspecified: Secondary | ICD-10-CM | POA: Diagnosis not present

## 2020-01-13 DIAGNOSIS — Z1331 Encounter for screening for depression: Secondary | ICD-10-CM | POA: Diagnosis not present

## 2020-01-13 DIAGNOSIS — Z299 Encounter for prophylactic measures, unspecified: Secondary | ICD-10-CM | POA: Diagnosis not present

## 2020-01-13 DIAGNOSIS — F322 Major depressive disorder, single episode, severe without psychotic features: Secondary | ICD-10-CM | POA: Diagnosis not present

## 2020-01-13 DIAGNOSIS — I10 Essential (primary) hypertension: Secondary | ICD-10-CM | POA: Diagnosis not present

## 2020-01-29 ENCOUNTER — Telehealth: Payer: Self-pay | Admitting: *Deleted

## 2020-01-29 NOTE — Telephone Encounter (Signed)
Patient advised to see PCP for referral.

## 2020-02-03 ENCOUNTER — Encounter: Payer: Self-pay | Admitting: Pain Medicine

## 2020-02-03 ENCOUNTER — Telehealth: Payer: Self-pay | Admitting: Pain Medicine

## 2020-02-03 NOTE — Telephone Encounter (Signed)
I set patient for a virtual appt to discuss increased pain and medications her GYN is recommending she try for her breast pain

## 2020-02-04 ENCOUNTER — Ambulatory Visit: Payer: PPO | Attending: Pain Medicine | Admitting: Pain Medicine

## 2020-02-04 ENCOUNTER — Other Ambulatory Visit: Payer: Self-pay

## 2020-02-04 DIAGNOSIS — M549 Dorsalgia, unspecified: Secondary | ICD-10-CM | POA: Diagnosis not present

## 2020-02-04 DIAGNOSIS — G894 Chronic pain syndrome: Secondary | ICD-10-CM

## 2020-02-04 DIAGNOSIS — K2960 Other gastritis without bleeding: Secondary | ICD-10-CM

## 2020-02-04 DIAGNOSIS — M5136 Other intervertebral disc degeneration, lumbar region: Secondary | ICD-10-CM

## 2020-02-04 DIAGNOSIS — M5442 Lumbago with sciatica, left side: Secondary | ICD-10-CM

## 2020-02-04 DIAGNOSIS — T39395A Adverse effect of other nonsteroidal anti-inflammatory drugs [NSAID], initial encounter: Secondary | ICD-10-CM

## 2020-02-04 DIAGNOSIS — M62838 Other muscle spasm: Secondary | ICD-10-CM

## 2020-02-04 DIAGNOSIS — F4321 Adjustment disorder with depressed mood: Secondary | ICD-10-CM

## 2020-02-04 DIAGNOSIS — F322 Major depressive disorder, single episode, severe without psychotic features: Secondary | ICD-10-CM | POA: Insufficient documentation

## 2020-02-04 DIAGNOSIS — R252 Cramp and spasm: Secondary | ICD-10-CM

## 2020-02-04 DIAGNOSIS — M4810 Ankylosing hyperostosis [Forestier], site unspecified: Secondary | ICD-10-CM

## 2020-02-04 DIAGNOSIS — Z79899 Other long term (current) drug therapy: Secondary | ICD-10-CM

## 2020-02-04 DIAGNOSIS — M545 Low back pain, unspecified: Secondary | ICD-10-CM

## 2020-02-04 DIAGNOSIS — K219 Gastro-esophageal reflux disease without esophagitis: Secondary | ICD-10-CM

## 2020-02-04 DIAGNOSIS — M961 Postlaminectomy syndrome, not elsewhere classified: Secondary | ICD-10-CM

## 2020-02-04 DIAGNOSIS — M51369 Other intervertebral disc degeneration, lumbar region without mention of lumbar back pain or lower extremity pain: Secondary | ICD-10-CM

## 2020-02-04 DIAGNOSIS — M25512 Pain in left shoulder: Secondary | ICD-10-CM

## 2020-02-04 DIAGNOSIS — M47816 Spondylosis without myelopathy or radiculopathy, lumbar region: Secondary | ICD-10-CM | POA: Diagnosis not present

## 2020-02-04 DIAGNOSIS — N62 Hypertrophy of breast: Secondary | ICD-10-CM | POA: Diagnosis not present

## 2020-02-04 DIAGNOSIS — M7918 Myalgia, other site: Secondary | ICD-10-CM

## 2020-02-04 DIAGNOSIS — E669 Obesity, unspecified: Secondary | ICD-10-CM | POA: Insufficient documentation

## 2020-02-04 DIAGNOSIS — M533 Sacrococcygeal disorders, not elsewhere classified: Secondary | ICD-10-CM | POA: Diagnosis not present

## 2020-02-04 DIAGNOSIS — M5441 Lumbago with sciatica, right side: Secondary | ICD-10-CM

## 2020-02-04 DIAGNOSIS — M25511 Pain in right shoulder: Secondary | ICD-10-CM

## 2020-02-04 DIAGNOSIS — F411 Generalized anxiety disorder: Secondary | ICD-10-CM | POA: Insufficient documentation

## 2020-02-04 DIAGNOSIS — Z8659 Personal history of other mental and behavioral disorders: Secondary | ICD-10-CM

## 2020-02-04 DIAGNOSIS — G8929 Other chronic pain: Secondary | ICD-10-CM

## 2020-02-04 DIAGNOSIS — M542 Cervicalgia: Secondary | ICD-10-CM | POA: Diagnosis not present

## 2020-02-04 MED ORDER — CYCLOBENZAPRINE HCL 10 MG PO TABS: 10.0000 mg | ORAL_TABLET | Freq: Three times a day (TID) | ORAL | 5 refills | Status: DC

## 2020-02-04 MED ORDER — BACLOFEN 10 MG PO TABS: 10.0000 mg | ORAL_TABLET | Freq: Every day | ORAL | 5 refills | Status: DC

## 2020-02-04 MED ORDER — DIAZEPAM 5 MG PO TABS: ORAL_TABLET | ORAL | 0 refills | Status: DC

## 2020-02-04 MED ORDER — OXYCODONE HCL 5 MG PO TABS: 5.0000 mg | ORAL_TABLET | Freq: Three times a day (TID) | ORAL | 0 refills | Status: DC | PRN

## 2020-02-04 MED ORDER — ESOMEPRAZOLE MAGNESIUM 40 MG PO CPDR: 40.0000 mg | DELAYED_RELEASE_CAPSULE | Freq: Every day | ORAL | 5 refills | Status: DC

## 2020-02-04 NOTE — Patient Instructions (Addendum)
____________________________________________________________________________________________  Preparing for Procedure with Sedation  Procedure appointments are limited to planned procedures: . No Prescription Refills. . No disability issues will be discussed. . No medication changes will be discussed.  Instructions: . Oral Intake: Do not eat or drink anything for at least 3 hours prior to your procedure. (Exception: Blood Pressure Medication. See below.) . Transportation: Unless otherwise stated by your physician, you may drive yourself after the procedure. . Blood Pressure Medicine: Do not forget to take your blood pressure medicine with a sip of water the morning of the procedure. If your Diastolic (lower reading)is above 100 mmHg, elective cases will be cancelled/rescheduled. . Blood thinners: These will need to be stopped for procedures. Notify our staff if you are taking any blood thinners. Depending on which one you take, there will be specific instructions on how and when to stop it. . Diabetics on insulin: Notify the staff so that you can be scheduled 1st case in the morning. If your diabetes requires high dose insulin, take only  of your normal insulin dose the morning of the procedure and notify the staff that you have done so. . Preventing infections: Shower with an antibacterial soap the morning of your procedure. . Build-up your immune system: Take 1000 mg of Vitamin C with every meal (3 times a day) the day prior to your procedure. . Antibiotics: Inform the staff if you have a condition or reason that requires you to take antibiotics before dental procedures. . Pregnancy: If you are pregnant, call and cancel the procedure. . Sickness: If you have a cold, fever, or any active infections, call and cancel the procedure. . Arrival: You must be in the facility at least 30 minutes prior to your scheduled procedure. . Children: Do not bring children with you. . Dress appropriately:  Bring dark clothing that you would not mind if they get stained. . Valuables: Do not bring any jewelry or valuables.  Reasons to call and reschedule or cancel your procedure: (Following these recommendations will minimize the risk of a serious complication.) . Surgeries: Avoid having procedures within 2 weeks of any surgery. (Avoid for 2 weeks before or after any surgery). . Flu Shots: Avoid having procedures within 2 weeks of a flu shots or . (Avoid for 2 weeks before or after immunizations). . Barium: Avoid having a procedure within 7-10 days after having had a radiological study involving the use of radiological contrast. (Myelograms, Barium swallow or enema study). . Heart attacks: Avoid any elective procedures or surgeries for the initial 6 months after a "Myocardial Infarction" (Heart Attack). . Blood thinners: It is imperative that you stop these medications before procedures. Let us know if you if you take any blood thinner.  . Infection: Avoid procedures during or within two weeks of an infection (including chest colds or gastrointestinal problems). Symptoms associated with infections include: Localized redness, fever, chills, night sweats or profuse sweating, burning sensation when voiding, cough, congestion, stuffiness, runny nose, sore throat, diarrhea, nausea, vomiting, cold or Flu symptoms, recent or current infections. It is specially important if the infection is over the area that we intend to treat. . Heart and lung problems: Symptoms that may suggest an active cardiopulmonary problem include: cough, chest pain, breathing difficulties or shortness of breath, dizziness, ankle swelling, uncontrolled high or unusually low blood pressure, and/or palpitations. If you are experiencing any of these symptoms, cancel your procedure and contact your primary care physician for an evaluation.  Remember:  Regular Business hours are:    Monday to Thursday 8:00 AM to 4:00 PM  Provider's  Schedule: Milinda Pointer, MD:  Procedure days: Tuesday and Thursday 7:30 AM to 4:00 PM  Gillis Santa, MD:  Procedure days: Monday and Wednesday 7:30 AM to 4:00 PM ____________________________________________________________________________________________    ______________________________________________________________________________________________  Weight Management Required  URGENT: Your weight has been found to be adversely affecting your health.  Dear Jennifer Harmon:  Your current Estimated body mass index is 34.72 kg/m as calculated from the following:   Height as of 11/05/19: '5\' 3"'$  (1.6 m).   Weight as of 11/05/19: 196 lb (88.9 kg).  Please use the table below to identify your weight category and associated incidence of chronic pain, secondary to your weight.  Body Mass Index (BMI) Classification BMI level (kg/m2) Category Associated incidence of chronic pain  <18  Underweight   18.5-24.9 Ideal body weight   25-29.9 Overweight  20%  30-34.9 Obese (Class I)  68%  35-39.9 Severe obesity (Class II)  136%  >40 Extreme obesity (Class III)  254%   In addition: You will be considered "Morbidly Obese", if your BMI is above 30 and you have one or more of the following conditions which are known to be caused and/or directly associated with obesity: 1.    Type 2 Diabetes (Which in turn can lead to cardiovascular diseases (CVD), stroke, peripheral vascular diseases (PVD), retinopathy, nephropathy, and neuropathy) 2.    Cardiovascular Disease (High Blood Pressure; Congestive Heart Failure; High Cholesterol; Coronary Artery Disease; Angina; or History of Heart Attacks) 3.    Breathing problems (Asthma; obesity-hypoventilation syndrome; obstructive sleep apnea; chronic inflammatory airway disease; reactive airway disease; or shortness of breath) 4.    Chronic kidney disease 5.    Liver disease (nonalcoholic fatty liver disease) 6.    High blood pressure 7.    Acid reflux  (gastroesophageal reflux disease; heartburn) 8.    Osteoarthritis (OA) (with any of the following: hip pain; knee pain; and/or low back pain) 9.    Low back pain (Lumbar Facet Syndrome; and/or Degenerative Disc Disease) 10.  Hip pain (Osteoarthritis of hip) (For every 1 lbs of added body weight, there is a 2 lbs increase in pressure inside of each hip articulation. 1:2 mechanical relationship) 11.  Knee pain (Osteoarthritis of knee) (For every 1 lbs of added body weight, there is a 4 lbs increase in pressure inside of each knee articulation. 1:4 mechanical relationship) (patients with a BMI>30 kg/m2 were 6.8 times more likely to develop knee OA than normal-weight individuals) 12.  Cancer: Epidemiological studies have shown that obesity is a risk factor for: post-menopausal breast cancer; cancers of the endometrium, colon and kidney cancer; malignant adenomas of the oesophagus. Obese subjects have an approximately 1.5-3.5-fold increased risk of developing these cancers compared with normal-weight subjects, and it has been estimated that between 15 and 45% of these cancers can be attributed to overweight. More recent studies suggest that obesity may also increase the risk of other types of cancer, including pancreatic, hepatic and gallbladder cancer. (Ref: Obesity and cancer. Pischon T, Nthlings U, [employer] H. Proc Nutr Soc. 2008 May;67(2):128-45. doi: 83.2549/I2641583094076808.) The International Agency for Research on Cancer (IARC) has identified 13 cancers associated with overweight and obesity: meningioma, multiple myeloma, adenocarcinoma of the esophagus, and cancers of the thyroid, postmenopausal breast cancer, gallbladder, stomach, liver, pancreas, kidney, ovaries, uterus, colon and rectal (colorectal) cancers. 76 percent of all cancers diagnosed in women and 24 percent of those diagnosed in men are associated with overweight and  obesity.  Recommendation: At this point it is urgent that you take a step  back and concentrate in loosing weight. Dedicate 100% of your efforts on this task. Nothing else will improve your health more than bringing your weight down and your BMI to less than 30. If you are here, you probably have chronic pain. Because most chronic pain patients have difficulty exercising secondary to their pain, you must rely on proper nutrition and diet in order to lose the weight. If your BMI is above 40, you should seriously consider bariatric surgery. A realistic goal is to lose 10% of your body weight over a period of 12 months.  Be honest to yourself, if over time you have unsuccessfully tried to lose weight, then it is time for you to seek professional help and to enter a medically supervised weight management program, and/or undergo bariatric surgery. Stop procrastinating.   Pain management considerations:  1.    Pharmacological Problems: Be advised that the use of opioid analgesics (oxycodone; hydrocodone; morphine; methadone; codeine; and all of their derivatives) have been associated with decreased metabolism and weight gain.  For this reason, should we see that you are unable to lose weight while taking these medications, it may become necessary for [place] to taper down and indefinitely discontinue them.  2.    Technical Problems: The incidence of successful interventional therapies decreases as the patient's BMI increases. It is much more difficult to accomplish a safe and effective interventional therapy on a patient with a BMI above 35. 3.    Radiation Exposure Problems: The x-rays machine, used to accomplish injection therapies, will automatically increase their x-ray output in order to capture an appropriate bone image. This means that radiation exposure increases exponentially with the patient's BMI. (The higher the BMI, the higher the radiation exposure.) Although the level of radiation used at a given time is still safe to the patient, it is not for the physician and/or assisting  staff. Unfortunately, radiation exposure is accumulative. Because physicians and the staff have to do procedures and be exposed on a daily basis, this can result in health problems such as cancer and radiation burns. Radiation exposure to the staff is monitored by the radiation batches that they wear. The exposure levels are reported back to the staff on a quarterly basis. Depending on levels of exposure, physicians and staff may be obligated by law to decrease this exposure. This means that they have the right and obligation to refuse providing therapies where they may be overexposed to radiation. For this reason, physicians may decline to offer therapies such as radiofrequency ablation or implants to patients with a BMI above 40. 4.    Current Trends: Be advised that the current trend is to no longer offer certain therapies to patients with a BMI equal to, or above 35, due to increase perioperative risks, increased technical procedural difficulties, and excessive radiation exposure to healthcare personnel.  ______________________________________________________________________________________________

## 2020-02-04 NOTE — Progress Notes (Signed)
Patient: Jennifer Harmon  Service Category: E/M  Provider: Gaspar Cola, MD  DOB: 23-Dec-1960  DOS: 02/04/2020  Location: Office  MRN: [id number]  Setting: Ambulatory outpatient  Referring Provider: Glenda Chroman, MD  Type: Established Patient  Specialty: Interventional Pain Management  PCP: Glenda Chroman, MD  Location: Remote location  Delivery: TeleHealth     Virtual Encounter - Pain Management PROVIDER NOTE: Information contained herein reflects review and annotations entered in association with encounter. Interpretation of such information and data should be left to medically-trained personnel. Information provided to patient can be located elsewhere in the medical record under "Patient Instructions". Document created using STT-dictation technology, any transcriptional errors that may result from process are unintentional.    Contact & Pharmacy Preferred: [place]: [phone] (home) Mobile: [phone] (mobile) E-mail: No e-mail address on record  [place], [place] [address] [place] [address] Phone: [phone] Fax: [phone]   Pre-screening  Ms. Sinning offered "in-person" vs "virtual" encounter. She indicated preferring virtual for this encounter.   Reason COVID-19*  Social distancing based on CDC and AMA recommendations.   I contacted [name] on 02/04/2020 via telephone.      I clearly identified myself as Gaspar Cola, MD. I verified that I was speaking with the correct person using two identifiers (Name: TEANNA ELEM, and date of birth: January 29, 1961).  Consent I sought verbal advanced consent from [name] for virtual visit interactions. I informed Ms. Inscore of possible security and privacy concerns, risks, and limitations associated with providing "not-in-person" medical evaluation and management services. I also informed Ms. Althouse of the availability of "in-person" appointments.  Finally, I informed her that there would be a charge for the virtual visit and that she could be  personally, fully or partially, financially responsible for it. Ms. Nesler expressed understanding and agreed to proceed.   Historic Elements   Ms. ARYELLA BESECKER is a 59 y.o. year old, female patient evaluated today after her last contact with our practice on 02/03/2020. Ms. Coven  has a past medical history of Acute postoperative pain (05/03/2018), Anxiety, Chronic chest pain, Depression, DJD (degenerative joint disease), Fibromyalgia, GERD (gastroesophageal reflux disease), and Hypercholesterolemia. She also  has a past surgical history that includes Cholecystectomy; Cesarean section; Back surgery (2008); and Joint replacement (Right, 04/16/2019). Ms. Montijo has a current medication list which includes the following prescription(s): baclofen, [START ON 03/08/2020] baclofen, calcium carbonate, vitamin d3, citalopram, clonazepam, cyclobenzaprine, [START ON 03/08/2020] cyclobenzaprine, esomeprazole, [START ON 03/08/2020] esomeprazole, latanoprost, magnesium oxide, multi-vitamin, mupirocin ointment, [START ON 02/15/2020] oxycodone, pregabalin, rosuvastatin, diazepam, [START ON 03/16/2020] oxycodone, and [START ON 04/15/2020] oxycodone. She  reports that she quit smoking about 4 years ago. Her smoking use included pipe. She has a 10.00 pack-year smoking history. She uses smokeless tobacco. She reports that she does not drink alcohol or use drugs. Ms. Agent is allergic to aspirin; bupropion; ibuprofen; and tape.   HPI  Today, she is being contacted for worsening of previously known (established) problem.  The patient continues to be significantly depressed and when I asked her if she had gone to a psychiatrist, she indicated that her family doctor gave her some antidepressants.  Clearly these are not working and it is my opinion that she needs to be seen by a psychiatrist for further management of her depression.  The  patient indicates doing well with the current medication regimen. No adverse  reactions or side effects reported to the medications.  During today's encounter the patient spent most of the time crying and telling me how bad her pain was without really allowing me to interject a word.  Once she calmed down, it was evident that she is having increase bilateral low back pain with the right being worse than the left.  She is also having some right lower extremity spasms over the area of her thigh.  The pain stops at her kneecap and does not go down any further.  For some reason, she has not completed the lumbar MRI that I ordered for her to have.  Today I will go ahead and reorder that MRI and we need to make sure that she gets it done.  Her symptoms would suggest that she is having a disc herniation above her prior surgery, probably affecting the L2-3 level.  She does have a history of lumbar facet syndrome which we have treated with lumbar facet RFA with limited success suggesting that there is something else going on in there.  In addition to this, the patient continues to have neck pain, shoulder pain, and upper back pain which are directly related to her macromastia.  Again, I have attempted several times to get a plastic surgeon to do a reductive mammoplasty, but I keep getting excuses about the surgeons not taking her insurance.  I am not really sure what is going on with this since we have been informed that there are several physicians that do take her insurance.  She definitely needs to have a reductive mammoplasty in order to have improvement not only of her neck pain, upper back pain, shoulder pain, but also her low back pain.  She has a BMI close to 35, but most of this weight is in her breasts.  Today she was indicating that she feels her breasts are continuing to increase in size and now she has been feeling like there is a mass developing under both of her arms, which she consulted her primary care  physician about this and they told her that it was likely to be her breast tissue growing into that area.  Clearly, something needs to be done with this.  It is my professional opinion that her macromastia is absolutely related to her chronic pain.  Today I will again put some referrals to a plastic surgeon, a psychiatrist and a psychologist for the treatment of her anxiety and severe depression, and a referral to a nutritionist to assist her with losing weight.  For her acute on chronic low back pain I will be bringing her for a possible bilateral lumbar facet block versus bilateral SI joint injection versus a combination of the 2 versus an L1-2 LESI under fluoroscopic guidance and IV sedation.  The final decision as to which one I will be doing will depend on my physical exam on the day of the procedure.  Pharmacotherapy Assessment  Analgesic: Oxycodone IR 5 mg, 1 tab PO q 8 hrs (15 mg/day of oxycodone) MME/day: 22.5 mg/day.   Monitoring: [place] PMP: PDMP reviewed during this encounter.       Pharmacotherapy: No side-effects or adverse reactions reported. Compliance: No problems identified. Effectiveness: Clinically acceptable. Plan: Refer to "POC".  UDS:  Summary  Date Value Ref Range Status  12/24/2018 FINAL  Final    Comment:    ==================================================================== TOXASSURE SELECT 13 (MW) ==================================================================== Test  Result       Flag       Units Drug Present and Declared for Prescription Verification   7-aminoclonazepam              315          EXPECTED   ng/mg creat    7-aminoclonazepam is an expected metabolite of clonazepam. Source    of clonazepam is a scheduled prescription medication.   Oxycodone                      221          EXPECTED   ng/mg creat   Oxymorphone                    247          EXPECTED   ng/mg creat   Noroxycodone                   573          EXPECTED    ng/mg creat   Noroxymorphone                 111          EXPECTED   ng/mg creat    Sources of oxycodone are scheduled prescription medications.    Oxymorphone, noroxycodone, and noroxymorphone are expected    metabolites of oxycodone. Oxymorphone is also available as a    scheduled prescription medication. ==================================================================== Test                      Result    Flag   Units      Ref Range   Creatinine              112              mg/dL      >=20 ==================================================================== Declared Medications:  The flagging and interpretation on this report are based on the  following declared medications.  Unexpected results may arise from  inaccuracies in the declared medications.  **Note: The testing scope of this panel includes these medications:  Clonazepam  Oxycodone  **Note: The testing scope of this panel does not include following  reported medications:  Citalopram  Cyclobenzaprine  Latanoprost  Magnesium Oxide  Multivitamin  Pregabalin  Ranitidine  Rosuvastatin ==================================================================== For clinical consultation, please call [phone]. ====================================================================    Laboratory Chemistry Profile   Renal Lab Results  Component Value Date   BUN 12 11/05/2019   CREATININE 0.53 11/05/2019   GFRAA >60 11/05/2019   GFRNONAA >60 11/05/2019     Hepatic Lab Results  Component Value Date   AST 17 11/05/2019   ALT 20 11/05/2019   ALBUMIN 4.0 11/05/2019   ALKPHOS 109 11/05/2019     Electrolytes Lab Results  Component Value Date   NA 141 11/05/2019   K 3.7 11/05/2019   CL 102 11/05/2019   CALCIUM 9.3 11/05/2019   MG 2.2 11/05/2019     Bone Lab Results  Component Value Date   VD25OH 21.15 (L) 11/05/2019     Inflammation (CRP: Acute Phase) (ESR: Chronic Phase) Lab Results  Component Value  Date   CRP 0.6 11/05/2019   ESRSEDRATE 21 11/05/2019       Note: Above Lab results reviewed.  Imaging  DG PAIN CLINIC C-ARM 1-60 MIN NO REPORT Fluoro was used, but no Radiologist interpretation will be provided.  Please refer to "NOTES" tab for provider progress note.  Assessment  The primary encounter diagnosis was Acute exacerbation of chronic low back pain. Diagnoses of Chronic low back pain  (Secondary Area of Pain) (Bilateral) (R>L), DDD (degenerative disc disease), lumbar, Other intervertebral disc degeneration, lumbar region, DISH (diffuse idiopathic skeletal hyperostosis), Lumbar facet syndrome (Bilateral) (R>L), Chronic sacroiliac joint pain (Bilateral) (R>L), Failed back surgical syndrome, Chronic neck pain (Tertiary Area of Pain) (Bilateral) (R>L), Chronic shoulder pain (Bilateral) (R>L), Chronic upper back pain (Primary Area of Pain) (Bilateral) (R>L), Macromastia, Chronic pain syndrome, Chronic musculoskeletal pain, Muscle spasms of both lower extremities, Night cramps, Obesity with body mass index (BMI) of 30.0 to 39.9, Gastroesophageal reflux disease without esophagitis, NSAID induced gastritis, Severe depression ([place]), Situational depression, Long term prescription benzodiazepine use, History of claustrophobia, Generalized anxiety disorder, History of anxiety, and History of depression were also pertinent to this visit.  Plan of Care  Problem-specific:  No problem-specific Assessment & Plan notes found for this encounter.  Ms. RAMIYA DELAHUNTY has a current medication list which includes the following long-term medication(s): baclofen, [START ON 03/08/2020] baclofen, calcium carbonate, clonazepam, cyclobenzaprine, [START ON 03/08/2020] cyclobenzaprine, esomeprazole, [START ON 03/08/2020] esomeprazole, magnesium oxide, [START ON 02/15/2020] oxycodone, pregabalin, rosuvastatin, diazepam, [START ON 03/16/2020] oxycodone, and [START ON 04/15/2020] oxycodone.  Pharmacotherapy (Medications  Ordered): Meds ordered this encounter  Medications  . cyclobenzaprine (FLEXERIL) 10 MG tablet    Sig: Take 1 tablet (10 mg total) by mouth 3 (three) times daily.    Dispense:  90 tablet    Refill:  5    Fill one day early if pharmacy is closed on scheduled refill date. May substitute for generic if available.  . baclofen (LIORESAL) 10 MG tablet    Sig: Take 1-2 tablets (10-20 mg total) by mouth at bedtime.    Dispense:  60 tablet    Refill:  5    Fill one day early if pharmacy is closed on scheduled refill date. May substitute for generic if available.  [name] oxyCODONE (OXY IR/ROXICODONE) 5 MG immediate release tablet    Sig: Take 1 tablet (5 mg total) by mouth every 8 (eight) hours as needed for severe pain. Must last 30 days    Dispense:  90 tablet    Refill:  0    Chronic Pain: STOP Act (Not applicable) Fill 1 day early if closed on refill date. Do not fill until: 03/16/2020. To last until: 04/15/2020. Avoid benzodiazepines within 8 hours of opioids  . oxyCODONE (OXY IR/ROXICODONE) 5 MG immediate release tablet    Sig: Take 1 tablet (5 mg total) by mouth every 8 (eight) hours as needed for severe pain. Must last 30 days    Dispense:  90 tablet    Refill:  0    Chronic Pain: STOP Act (Not applicable) Fill 1 day early if closed on refill date. Do not fill until: 04/15/2020. To last until: 05/15/2020. Avoid benzodiazepines within 8 hours of opioids  . esomeprazole (NEXIUM) 40 MG capsule    Sig: Take 1 capsule (40 mg total) by mouth at bedtime. Take while on ibuprofen.    Dispense:  30 capsule    Refill:  5    Fill one day early if pharmacy is closed on scheduled refill date. May substitute for generic if available.  . diazepam (VALIUM) 5 MG tablet    Sig: Take 1 tab 45 minutes prior to MRI, and second tab just before scan    Dispense:  2  tablet    Refill:  0    Must have a driver. Do not drive or operate machinery x 24 hours after taking this medication.   Orders:  Orders Placed This  Encounter  Procedures  . LUMBAR FACET(MEDIAL BRANCH NERVE BLOCK) MBNB    Standing Status:   Future    Standing Expiration Date:   03/06/2020    Scheduling Instructions:     Procedure: Lumbar facet block (AKA.: Lumbosacral medial branch nerve block)     Side: Bilateral     Level: L3-4, L4-5, & L5-S1 Facets (L2, L3, L4, L5, & S1 Medial Branch Nerves)     Sedation: Patient's choice.     Timeframe: ASAA    Order Specific Question:   Where will this procedure be performed?    Answer:   [facility]  . SACROILIAC JOINT INJECTION    Standing Status:   Future    Standing Expiration Date:   03/06/2020    Scheduling Instructions:     Side: Bilateral     Sedation: Patient's choice.     Timeframe: ASAP    Order Specific Question:   Where will this procedure be performed?    Answer:   [facility]  . MR LUMBAR SPINE W WO CONTRAST    Patient presents with axial pain with possible radicular component.  In addition to any acute findings, please report on:  1. Facet (Zygapophyseal) joint DJD (Hypertrophy, space narrowing, subchondral sclerosis, and/or osteophyte formation) 2. DDD and/or IVDD (Loss of disc height, desiccation or "Black disc disease") 3. Pars defects 4. Spondylolisthesis, spondylosis, and/or spondyloarthropathies (include Degree/Grade of displacement in mm) 5. Vertebral body Fractures, including age (old, new/acute) 85. Modic Type Changes 7. Demineralization 8. Bone pathology 9. Central, Lateral Recess, and/or Foraminal Stenosis (include AP diameter of stenosis in mm) 10. Surgical changes (hardware type, status, and presence of fibrosis) NOTE: Please specify level(s) and laterality.    Standing Status:   Future    Standing Expiration Date:   05/06/2020    Order Specific Question:   If indicated for the ordered procedure, I authorize the administration of contrast media per Radiology protocol    Answer:   Yes    Order Specific Question:   What is the patient's  sedation requirement?    Answer:   Oral Sedation    Order Specific Question:   Does the patient have a pacemaker or implanted devices?    Answer:   No    Order Specific Question:   Call Results- Best Contact Number?    Answer:   [phone] ([facility])    Order Specific Question:   Radiology Contrast Protocol - do NOT remove file path    Answer:   [email or web address]    Order Specific Question:   Preferred imaging location?    Answer:   [facility] (table limit-350lbs)  . Ambulatory referral to Psychiatry    Referral Priority:   Routine    Referral Type:   Psychiatric    Referral Reason:   Specialty Services Required    Requested Specialty:   Psychiatry    Number of Visits Requested:   1  . Ambulatory referral to Psychology    Referral Priority:   Routine    Referral Type:   Psychiatric    Referral Reason:   Specialty Services Required    Requested Specialty:   Psychology    Number of Visits Requested:   1  . Ambulatory referral to Plastic Surgery  Referral Priority:   Routine    Referral Type:   Surgical    Referral Reason:   Specialty Services Required    Requested Specialty:   Plastic Surgery    Number of Visits Requested:   1  . Amb Ref to Medical Weight Management    Referral Priority:   Routine    Referral Type:   Consultation    Referral Reason:   Specialty Services Required    Number of Visits Requested:   1   Follow-up plan:   Return for Procedure (w/ sedation): (B) L-FCT BLK #4 + (B) SI BLK #3, (ASAP).      Interventional management options: Considering:   Diagnostic Trigger point injections Diagnostic midline caudal ESI  Possible Racz procedure (epidural Neurolysis) Diagnostic thoracic facet block Possible thoracic facet RFA Diagnostic bilateral CESI Diagnosticbilateral cervical facet block Possiblebilateral cervical facet RFA Diagnostic right IA shoulder injection Diagnostic right suprascapular  NB Possibleright suprascapular RFA Diagnostic left genicular NB Possible bilateral genicular RFA   Palliative PRN treatment(s):   Palliative right T6-7 TESI #2  Palliative bilateral lumbar facet block #4  Palliative right SI block #3  Palliative right IA hip joint injections  Palliative right lumbar facet RFA #2 (last done 07/25/2019)  Palliative right SI joint RFA #2 (last done 05/03/2018)  Palliative left  lumbar facet RFA #2 (last done 09/10/2019)  Palliative right genicular NB #2     Recent Visits Date Type Provider Dept  12/09/19 Telemedicine Milinda Pointer, MD [facility]  11/18/19 Telemedicine Milinda Pointer, MD [facility]  Showing recent visits within past 90 days and meeting all other requirements   Today's Visits Date Type Provider Dept  02/04/20 Office Visit Milinda Pointer, MD [facility]  Showing today's visits and meeting all other requirements   Future Appointments Date Type Provider Dept  03/02/20 Appointment Milinda Pointer, MD [facility]  Showing future appointments within next 90 days and meeting all other requirements   I discussed the assessment and treatment plan with the patient. The patient was provided an opportunity to ask questions and all were answered. The patient agreed with the plan and demonstrated an understanding of the instructions.  Patient advised to call back or seek an in-person evaluation if the symptoms or condition worsens.  Duration of encounter: 22 minutes.  Note by: Gaspar Cola, MD Date: 02/04/2020; Time: 5:56 PM

## 2020-02-05 DIAGNOSIS — K219 Gastro-esophageal reflux disease without esophagitis: Secondary | ICD-10-CM | POA: Diagnosis not present

## 2020-02-05 DIAGNOSIS — F419 Anxiety disorder, unspecified: Secondary | ICD-10-CM | POA: Diagnosis not present

## 2020-02-06 DIAGNOSIS — H11153 Pinguecula, bilateral: Secondary | ICD-10-CM | POA: Diagnosis not present

## 2020-02-06 DIAGNOSIS — H40023 Open angle with borderline findings, high risk, bilateral: Secondary | ICD-10-CM | POA: Diagnosis not present

## 2020-02-06 DIAGNOSIS — H04123 Dry eye syndrome of bilateral lacrimal glands: Secondary | ICD-10-CM | POA: Diagnosis not present

## 2020-02-06 DIAGNOSIS — H2513 Age-related nuclear cataract, bilateral: Secondary | ICD-10-CM | POA: Diagnosis not present

## 2020-02-06 DIAGNOSIS — H52223 Regular astigmatism, bilateral: Secondary | ICD-10-CM | POA: Diagnosis not present

## 2020-02-06 DIAGNOSIS — H524 Presbyopia: Secondary | ICD-10-CM | POA: Diagnosis not present

## 2020-02-06 DIAGNOSIS — H11133 Conjunctival pigmentations, bilateral: Secondary | ICD-10-CM | POA: Diagnosis not present

## 2020-02-06 DIAGNOSIS — H40053 Ocular hypertension, bilateral: Secondary | ICD-10-CM | POA: Diagnosis not present

## 2020-02-06 DIAGNOSIS — H5203 Hypermetropia, bilateral: Secondary | ICD-10-CM | POA: Diagnosis not present

## 2020-02-06 DIAGNOSIS — H40002 Preglaucoma, unspecified, left eye: Secondary | ICD-10-CM | POA: Diagnosis not present

## 2020-02-06 DIAGNOSIS — H40001 Preglaucoma, unspecified, right eye: Secondary | ICD-10-CM | POA: Diagnosis not present

## 2020-02-06 DIAGNOSIS — H16223 Keratoconjunctivitis sicca, not specified as Sjogren's, bilateral: Secondary | ICD-10-CM | POA: Diagnosis not present

## 2020-02-10 ENCOUNTER — Encounter: Payer: Self-pay | Admitting: Psychology

## 2020-02-10 DIAGNOSIS — Z299 Encounter for prophylactic measures, unspecified: Secondary | ICD-10-CM | POA: Diagnosis not present

## 2020-02-10 DIAGNOSIS — I10 Essential (primary) hypertension: Secondary | ICD-10-CM | POA: Diagnosis not present

## 2020-02-10 DIAGNOSIS — F322 Major depressive disorder, single episode, severe without psychotic features: Secondary | ICD-10-CM | POA: Diagnosis not present

## 2020-02-10 DIAGNOSIS — F419 Anxiety disorder, unspecified: Secondary | ICD-10-CM | POA: Diagnosis not present

## 2020-02-11 ENCOUNTER — Ambulatory Visit: Payer: PPO | Admitting: Pain Medicine

## 2020-02-11 NOTE — Progress Notes (Signed)
yes

## 2020-02-12 ENCOUNTER — Telehealth: Payer: PPO | Admitting: Pain Medicine

## 2020-02-13 ENCOUNTER — Encounter: Payer: PPO | Admitting: Pain Medicine

## 2020-02-13 NOTE — Progress Notes (Signed)
Appointment canceled.  No-show.  I recommend a second

## 2020-02-13 NOTE — Patient Instructions (Signed)

## 2020-02-14 DIAGNOSIS — I10 Essential (primary) hypertension: Secondary | ICD-10-CM | POA: Diagnosis not present

## 2020-02-14 DIAGNOSIS — L02519 Cutaneous abscess of unspecified hand: Secondary | ICD-10-CM | POA: Diagnosis not present

## 2020-02-14 DIAGNOSIS — Z299 Encounter for prophylactic measures, unspecified: Secondary | ICD-10-CM | POA: Diagnosis not present

## 2020-02-18 ENCOUNTER — Ambulatory Visit
Admission: RE | Admit: 2020-02-18 | Discharge: 2020-02-18 | Disposition: A | Discharge status: Home / Self Care | Payer: PPO | Source: Ambulatory Visit | Attending: Pain Medicine | Admitting: Pain Medicine

## 2020-02-18 ENCOUNTER — Other Ambulatory Visit: Payer: Self-pay

## 2020-02-18 DIAGNOSIS — M545 Low back pain, unspecified: Secondary | ICD-10-CM

## 2020-02-18 DIAGNOSIS — M48061 Spinal stenosis, lumbar region without neurogenic claudication: Secondary | ICD-10-CM | POA: Diagnosis not present

## 2020-02-18 DIAGNOSIS — M961 Postlaminectomy syndrome, not elsewhere classified: Secondary | ICD-10-CM | POA: Insufficient documentation

## 2020-02-18 DIAGNOSIS — M5441 Lumbago with sciatica, right side: Secondary | ICD-10-CM | POA: Insufficient documentation

## 2020-02-18 DIAGNOSIS — M47816 Spondylosis without myelopathy or radiculopathy, lumbar region: Secondary | ICD-10-CM | POA: Diagnosis not present

## 2020-02-18 DIAGNOSIS — G8929 Other chronic pain: Secondary | ICD-10-CM | POA: Insufficient documentation

## 2020-02-18 DIAGNOSIS — M5136 Other intervertebral disc degeneration, lumbar region: Secondary | ICD-10-CM | POA: Insufficient documentation

## 2020-02-18 DIAGNOSIS — M5442 Lumbago with sciatica, left side: Secondary | ICD-10-CM | POA: Diagnosis not present

## 2020-02-18 DIAGNOSIS — M4810 Ankylosing hyperostosis [Forestier], site unspecified: Secondary | ICD-10-CM | POA: Diagnosis not present

## 2020-02-18 DIAGNOSIS — M5126 Other intervertebral disc displacement, lumbar region: Secondary | ICD-10-CM | POA: Diagnosis not present

## 2020-02-18 MED ORDER — GADOBUTROL 1 MMOL/ML IV SOLN
8.0000 mL | Freq: Once | INTRAVENOUS | Status: AC | PRN
  Administered 2020-02-18: 10 mL via INTRAVENOUS

## 2020-02-28 ENCOUNTER — Ambulatory Visit: Payer: PPO | Admitting: Dietician

## 2020-02-28 NOTE — Progress Notes (Signed)
Patient: Jennifer Harmon  Service Category: E/M  Provider: Gaspar Cola, MD  DOB: October 04, 1961  DOS: 03/02/2020  Location: Office  MRN: [id number]  Setting: Ambulatory outpatient  Referring Provider: Glenda Chroman, MD  Type: Established Patient  Specialty: Interventional Pain Management  PCP: Glenda Chroman, MD  Location: Remote location  Delivery: TeleHealth     Virtual Encounter - Pain Management PROVIDER NOTE: Information contained herein reflects review and annotations entered in association with encounter. Interpretation of such information and data should be left to medically-trained personnel. Information provided to patient can be located elsewhere in the medical record under "Patient Instructions". Document created using STT-dictation technology, any transcriptional errors that may result from process are unintentional.    Contact & Pharmacy Preferred: [place]: [phone] (home) Mobile: [phone] (mobile) E-mail: No e-mail address on record  [place] [place] [address] [place] [address] Phone: [phone] Fax: [phone]   Pre-screening  Jennifer Harmon offered "in-person" vs "virtual" encounter. She indicated preferring virtual for this encounter.   Reason COVID-19*  Social distancing based on CDC and AMA recommendations.   I contacted [name] on 03/02/2020 via telephone.      I clearly identified myself as Gaspar Cola, MD. I verified that I was speaking with the correct person using two identifiers (Name: Jennifer Harmon, and date of birth: 10-04-1961).  Consent I sought verbal advanced consent from [name] for virtual visit interactions. I informed Ms. Wrobleski of possible security and privacy concerns, risks, and limitations associated with providing "not-in-person" medical evaluation and management services. I also informed Jennifer Harmon of the availability of "in-person" appointments.  Finally, I informed her that there would be a charge for the virtual visit and that she could be  personally, fully or partially, financially responsible for it. Ms. Sons expressed understanding and agreed to proceed.   Historic Elements   [name] is a 59 y.o. year old, female patient evaluated today after her last contact with our practice on 02/04/2020. Jennifer Harmon  has a past medical history of Acute postoperative pain (05/03/2018), Anxiety, Chronic chest pain, Depression, DJD (degenerative joint disease), Fibromyalgia, GERD (gastroesophageal reflux disease), and Hypercholesterolemia. She also  has a past surgical history that includes Cholecystectomy; Cesarean section; Back surgery (2008); and Joint replacement (Right, 04/16/2019). Jennifer Harmon has a current medication list which includes the following prescription(s): [START ON 03/08/2020] baclofen, calcium carbonate, vitamin d3, citalopram, clonazepam, [START ON 03/08/2020] cyclobenzaprine, [START ON 03/08/2020] esomeprazole, latanoprost, magnesium oxide, multi-vitamin, mupirocin ointment, [START ON 03/16/2020] oxycodone, [START ON 04/15/2020] oxycodone, [START ON 05/15/2020] oxycodone, pregabalin, and rosuvastatin. She  reports that she quit smoking about 4 years ago. Her smoking use included pipe. She has a 10.00 pack-year smoking history. She uses smokeless tobacco. She reports that she does not drink alcohol or use drugs. Jennifer Harmon is allergic to aspirin; bupropion; ibuprofen; and tape.   HPI  Today, she is being contacted for medication management.  The patient indicates doing well with the current medication regimen. No adverse reactions or side effects reported to the medications.  The patient missed her last appointment for a palliative bilateral lumbar facet block and diagnostic bilateral sacroiliac joint block under fluoroscopic guidance and IV sedation.  She has been rescheduled for 03/19/2020.  As usual, she continues to be very depressed  and every time that I call her she ends up in tears.  I  had already refer her to a psychiatrist, but she indicates that her primary care physician has also given her some antidepressants.  I also referred her to a surgeon to do a reductive mammoplasty since her macromastia is definitely contributing to her back pain and neck pain.  She indicates that she did talk to them and she has an appointment to see them in May.  Hopefully she will keep this appointment and not miss it.  She definitely needs to have a breast reduction so as to eliminate some of this excess weight.  Today I had a chance to review her recent lumbar MRI which shows progression in terms of her lumbar deterioration with some possible nerve impingement.  Pharmacotherapy Assessment  Analgesic: Oxycodone IR 5 mg, 1 tab PO q 8 hrs (15 mg/day of oxycodone) MME/day: 22.5 mg/day.   Monitoring: [place] PMP: PDMP reviewed during this encounter.       Pharmacotherapy: No side-effects or adverse reactions reported. Compliance: No problems identified. Effectiveness: Clinically acceptable. Plan: Refer to "POC".  UDS:  Summary  Date Value Ref Range Status  12/24/2018 FINAL  Final    Comment:    ==================================================================== TOXASSURE SELECT 13 (MW) ==================================================================== Test                             Result       Flag       Units Drug Present and Declared for Prescription Verification   7-aminoclonazepam              315          EXPECTED   ng/mg creat    7-aminoclonazepam is an expected metabolite of clonazepam. Source    of clonazepam is a scheduled prescription medication.   Oxycodone                      221          EXPECTED   ng/mg creat   Oxymorphone                    247          EXPECTED   ng/mg creat   Noroxycodone                   573          EXPECTED   ng/mg creat   Noroxymorphone                 111          EXPECTED   ng/mg creat     Sources of oxycodone are scheduled prescription medications.    Oxymorphone, noroxycodone, and noroxymorphone are expected    metabolites of oxycodone. Oxymorphone is also available as a    scheduled prescription medication. ==================================================================== Test                      Result    Flag   Units      Ref Range   Creatinine              112              mg/dL      >=20 ==================================================================== Declared Medications:  The flagging and interpretation on this report are based on the  following declared medications.  Unexpected results may arise from  inaccuracies in the declared medications.  **Note:  The testing scope of this panel includes these medications:  Clonazepam  Oxycodone  **Note: The testing scope of this panel does not include following  reported medications:  Citalopram  Cyclobenzaprine  Latanoprost  Magnesium Oxide  Multivitamin  Pregabalin  Ranitidine  Rosuvastatin ==================================================================== For clinical consultation, please call [phone]. ====================================================================    Laboratory Chemistry Profile   Renal Lab Results  Component Value Date   BUN 12 11/05/2019   CREATININE 0.53 11/05/2019   GFRAA >60 11/05/2019   GFRNONAA >60 11/05/2019     Hepatic Lab Results  Component Value Date   AST 17 11/05/2019   ALT 20 11/05/2019   ALBUMIN 4.0 11/05/2019   ALKPHOS 109 11/05/2019     Electrolytes Lab Results  Component Value Date   NA 141 11/05/2019   K 3.7 11/05/2019   CL 102 11/05/2019   CALCIUM 9.3 11/05/2019   MG 2.2 11/05/2019     Bone Lab Results  Component Value Date   VD25OH 21.15 (L) 11/05/2019     Inflammation (CRP: Acute Phase) (ESR: Chronic Phase) Lab Results  Component Value Date   CRP 0.6 11/05/2019   ESRSEDRATE 21 11/05/2019       Note: Above Lab results  reviewed.  Imaging  MR LUMBAR SPINE W WO CONTRAST CLINICAL DATA:  Lumbosacral osteoarthritis with low back pain for over 6 weeks. Lumbar radiculopathy for over 6 weeks.  EXAM: MRI LUMBAR SPINE WITHOUT AND WITH CONTRAST  TECHNIQUE: Multiplanar and multiecho pulse sequences of the lumbar spine were obtained without and with intravenous contrast.  CONTRAST:  37m GADAVIST GADOBUTROL 1 MMOL/ML IV SOLN  COMPARISON:  12/25/2015 report and axial T2 weighted images, there is incomplete archiving of this study.  FINDINGS: Segmentation:  Standard lumbar numbering based on prior  Alignment:  Minimal retrolisthesis at L3-4.  Vertebrae: L4-S1 PLIF with solid arthrodesis. No fracture, discitis, or aggressive bone lesion. Degenerative fatty marrow conversion at L2-3 and L3-4 primarily. Mild corner edema at L1 superiorly associated with a spondylitic osteophyte. Chronic T12 superior endplate Schmorl's node. Probable bridging osteophyte at T10-11  Conus medullaris and cauda equina: Conus extends to the L2 level. Conus and cauda equina appear normal.  Paraspinal and other soft tissues: Atrophy of intrinsic back muscles after surgery.  Disc levels:  T12- L1: Spondylosis.  Mild facet spurring.  No impingement  L1-L2: Spondylosis with disc narrowing and circumferential bulge. Mild ligamentum flavum thickening. No neural impingement.  L2-L3: Disc narrowing and endplate degeneration with bulging and ridging. Degenerative facet spurring on both sides. Mild right and more moderate left foraminal narrowing without complete effacement of perineural fat. The spinal canal is sufficiently patent. Mild degenerative facet spurring and ligamentum flavum thickening with small interspinous bursa.  L3-L4: Disc narrowing and bulging with endplate spurring. Facet osteoarthritis with bony and ligamentous hypertrophy. Advanced spinal stenosis with complete effacement of CSF from the thecal sac. There is  right foraminal stenosis with L3 root flattening on sagittal images.  L4-L5: PLIF with solid arthrodesis and no visible impingement or arachnoiditis.  L5-S1:PLIF with solid arthrodesis and no residual impingement.  IMPRESSION: 1. L3-4 adjacent segment disease with advanced spinal and right foraminal impingement. 2. L2-3 mild to moderate left foraminal narrowing. 3. L4-S1 PLIF with solid arthrodesis and no residual impingement.  Electronically Signed   By: JMonte FantasiaM.D.   On: 02/18/2020 11:24  Assessment  The primary encounter diagnosis was Chronic pain syndrome. Diagnoses of Lumbar central spinal stenosis, w/ neurogenic claudication (L3-4), Lumbar foraminal stenosis (Right) (  L3-4), Chronic low back pain  (Secondary Area of Pain) (Bilateral) (R>L), Abnormal MRI, lumbar spine (02/18/2020), DISH (diffuse idiopathic skeletal hyperostosis), Failed back surgical syndrome, Lumbar facet syndrome (Bilateral) (R>L), Chronic sacroiliac joint pain (Bilateral) (R>L), and Pharmacologic therapy were also pertinent to this visit.  Plan of Care  Problem-specific:  No problem-specific Assessment & Plan notes found for this encounter.  Ms. INGRIS PASQUARELLA has a current medication list which includes the following long-term medication(s): [START ON 03/08/2020] baclofen, calcium carbonate, clonazepam, [START ON 03/08/2020] cyclobenzaprine, [START ON 03/08/2020] esomeprazole, magnesium oxide, [START ON 03/16/2020] oxycodone, [START ON 04/15/2020] oxycodone, [START ON 05/15/2020] oxycodone, pregabalin, and rosuvastatin.  Pharmacotherapy (Medications Ordered): Meds ordered this encounter  Medications  . oxyCODONE (OXY IR/ROXICODONE) 5 MG immediate release tablet    Sig: Take 1 tablet (5 mg total) by mouth every 8 (eight) hours as needed for severe pain. Must last 30 days    Dispense:  90 tablet    Refill:  0    Chronic Pain: STOP Act (Not applicable) Fill 1 day early if closed on refill date. Do not fill  until: 05/15/2020. To last until: 06/14/2020. Avoid benzodiazepines within 8 hours of opioids   Orders:  Orders Placed This Encounter  Procedures  . Lumbar Epidural Injection    Standing Status:   Future    Standing Expiration Date:   04/01/2020    Scheduling Instructions:     Procedure: Interlaminar Lumbar Epidural Steroid injection (LESI)  L3-4     Laterality: Right-sided     Sedation: Patient's choice.     Timeframe: ASAA    Order Specific Question:   Where will this procedure be performed?    Answer:   [facility]  . Lumbar Transforaminal Epidural    Standing Status:   Future    Standing Expiration Date:   04/01/2020    Scheduling Instructions:     Side: Right-sided     Level: L3     Sedation: Patient's choice.     Timeframe: ASAP    Order Specific Question:   Where will this procedure be performed?    Answer:   [facility]  . ToxASSURE Select 13 (MW), Urine    Volume: 30 ml(s). Minimum 3 ml of urine is needed. Document temperature of fresh sample. Indications: Long term (current) use of opiate analgesic (T90.300)    Order Specific Question:   Release to patient    Answer:   Immediate   Follow-up plan:   Return in about 3 months (around 06/10/2020) for (F2F), (MM), in addition, Procedure (w/ sedation): (ML) L3-4 LESI #1 + (R) L3 TFESI #1.      Interventional management options: Considering:   Diagnostic Trigger point injections Diagnostic midline caudal ESI  Possible Racz procedure (epidural Neurolysis) Diagnostic thoracic facet block Possible thoracic facet RFA Diagnostic bilateral CESI Diagnosticbilateral cervical facet block Possiblebilateral cervical facet RFA Diagnostic right IA shoulder injection Diagnostic right suprascapular NB Possibleright suprascapular RFA Diagnostic left genicular NB Possible bilateral genicular RFA   Palliative PRN treatment(s):   Palliative right T6-7 TESI #2  Palliative bilateral lumbar facet block #4   Palliative right SI block #3  Palliative right IA hip joint injections  Palliative right lumbar facet RFA #2 (last done 07/25/2019)  Palliative right SI joint RFA #2 (last done 05/03/2018)  Palliative left  lumbar facet RFA #2 (last done 09/10/2019)  Palliative right genicular NB #2     Recent Visits Date Type Provider Dept  02/04/20 Office  Visit Milinda Pointer, MD [facility]  12/09/19 Telemedicine Milinda Pointer, MD [facility]  Showing recent visits within past 90 days and meeting all other requirements   Today's Visits Date Type Provider Dept  03/02/20 Telemedicine Milinda Pointer, MD [facility]  Showing today's visits and meeting all other requirements   Future Appointments Date Type Provider Dept  03/19/20 Appointment Milinda Pointer, MD [facility]  Showing future appointments within next 90 days and meeting all other requirements   I discussed the assessment and treatment plan with the patient. The patient was provided an opportunity to ask questions and all were answered. The patient agreed with the plan and demonstrated an understanding of the instructions.  Patient advised to call back or seek an in-person evaluation if the symptoms or condition worsens.  Duration of encounter: 18 minutes.  Note by: Gaspar Cola, MD Date: 03/02/2020; Time: 10:21 AM

## 2020-03-02 ENCOUNTER — Ambulatory Visit: Payer: PPO | Attending: Pain Medicine | Admitting: Pain Medicine

## 2020-03-02 ENCOUNTER — Other Ambulatory Visit: Payer: Self-pay

## 2020-03-02 ENCOUNTER — Telehealth: Payer: Self-pay | Admitting: *Deleted

## 2020-03-02 DIAGNOSIS — Z79899 Other long term (current) drug therapy: Secondary | ICD-10-CM

## 2020-03-02 DIAGNOSIS — M533 Sacrococcygeal disorders, not elsewhere classified: Secondary | ICD-10-CM | POA: Diagnosis not present

## 2020-03-02 DIAGNOSIS — M961 Postlaminectomy syndrome, not elsewhere classified: Secondary | ICD-10-CM

## 2020-03-02 DIAGNOSIS — Z87891 Personal history of nicotine dependence: Secondary | ICD-10-CM | POA: Diagnosis not present

## 2020-03-02 DIAGNOSIS — M5442 Lumbago with sciatica, left side: Secondary | ICD-10-CM | POA: Diagnosis not present

## 2020-03-02 DIAGNOSIS — Z299 Encounter for prophylactic measures, unspecified: Secondary | ICD-10-CM | POA: Diagnosis not present

## 2020-03-02 DIAGNOSIS — G8929 Other chronic pain: Secondary | ICD-10-CM

## 2020-03-02 DIAGNOSIS — M48062 Spinal stenosis, lumbar region with neurogenic claudication: Secondary | ICD-10-CM | POA: Diagnosis not present

## 2020-03-02 DIAGNOSIS — R937 Abnormal findings on diagnostic imaging of other parts of musculoskeletal system: Secondary | ICD-10-CM | POA: Diagnosis not present

## 2020-03-02 DIAGNOSIS — M48061 Spinal stenosis, lumbar region without neurogenic claudication: Secondary | ICD-10-CM | POA: Insufficient documentation

## 2020-03-02 DIAGNOSIS — M25561 Pain in right knee: Secondary | ICD-10-CM | POA: Diagnosis not present

## 2020-03-02 DIAGNOSIS — M4810 Ankylosing hyperostosis [Forestier], site unspecified: Secondary | ICD-10-CM | POA: Diagnosis not present

## 2020-03-02 DIAGNOSIS — G894 Chronic pain syndrome: Secondary | ICD-10-CM

## 2020-03-02 DIAGNOSIS — M5441 Lumbago with sciatica, right side: Secondary | ICD-10-CM

## 2020-03-02 DIAGNOSIS — M47816 Spondylosis without myelopathy or radiculopathy, lumbar region: Secondary | ICD-10-CM | POA: Diagnosis not present

## 2020-03-02 DIAGNOSIS — M79643 Pain in unspecified hand: Secondary | ICD-10-CM | POA: Diagnosis not present

## 2020-03-02 DIAGNOSIS — M19042 Primary osteoarthritis, left hand: Secondary | ICD-10-CM | POA: Diagnosis not present

## 2020-03-02 DIAGNOSIS — M19041 Primary osteoarthritis, right hand: Secondary | ICD-10-CM | POA: Diagnosis not present

## 2020-03-02 MED ORDER — OXYCODONE HCL 5 MG PO TABS: 5.0000 mg | ORAL_TABLET | Freq: Three times a day (TID) | ORAL | 0 refills | Status: DC | PRN

## 2020-03-02 NOTE — Patient Instructions (Signed)
____________________________________________________________________________________________  Preparing for Procedure with Sedation  Procedure appointments are limited to planned procedures: . No Prescription Refills. . No disability issues will be discussed. . No medication changes will be discussed.  Instructions: . Oral Intake: Do not eat or drink anything for at least 3 hours prior to your procedure. (Exception: Blood Pressure Medication. See below.) . Transportation: Unless otherwise stated by your physician, you may drive yourself after the procedure. . Blood Pressure Medicine: Do not forget to take your blood pressure medicine with a sip of water the morning of the procedure. If your Diastolic (lower reading)is above 100 mmHg, elective cases will be cancelled/rescheduled. . Blood thinners: These will need to be stopped for procedures. Notify our staff if you are taking any blood thinners. Depending on which one you take, there will be specific instructions on how and when to stop it. . Diabetics on insulin: Notify the staff so that you can be scheduled 1st case in the morning. If your diabetes requires high dose insulin, take only  of your normal insulin dose the morning of the procedure and notify the staff that you have done so. . Preventing infections: Shower with an antibacterial soap the morning of your procedure. . Build-up your immune system: Take 1000 mg of Vitamin C with every meal (3 times a day) the day prior to your procedure. . Antibiotics: Inform the staff if you have a condition or reason that requires you to take antibiotics before dental procedures. . Pregnancy: If you are pregnant, call and cancel the procedure. . Sickness: If you have a cold, fever, or any active infections, call and cancel the procedure. . Arrival: You must be in the facility at least 30 minutes prior to your scheduled procedure. . Children: Do not bring children with you. . Dress appropriately:  Bring dark clothing that you would not mind if they get stained. . Valuables: Do not bring any jewelry or valuables.  Reasons to call and reschedule or cancel your procedure: (Following these recommendations will minimize the risk of a serious complication.) . Surgeries: Avoid having procedures within 2 weeks of any surgery. (Avoid for 2 weeks before or after any surgery). . Flu Shots: Avoid having procedures within 2 weeks of a flu shots or . (Avoid for 2 weeks before or after immunizations). . Barium: Avoid having a procedure within 7-10 days after having had a radiological study involving the use of radiological contrast. (Myelograms, Barium swallow or enema study). . Heart attacks: Avoid any elective procedures or surgeries for the initial 6 months after a "Myocardial Infarction" (Heart Attack). . Blood thinners: It is imperative that you stop these medications before procedures. Let us know if you if you take any blood thinner.  . Infection: Avoid procedures during or within two weeks of an infection (including chest colds or gastrointestinal problems). Symptoms associated with infections include: Localized redness, fever, chills, night sweats or profuse sweating, burning sensation when voiding, cough, congestion, stuffiness, runny nose, sore throat, diarrhea, nausea, vomiting, cold or Flu symptoms, recent or current infections. It is specially important if the infection is over the area that we intend to treat. . Heart and lung problems: Symptoms that may suggest an active cardiopulmonary problem include: cough, chest pain, breathing difficulties or shortness of breath, dizziness, ankle swelling, uncontrolled high or unusually low blood pressure, and/or palpitations. If you are experiencing any of these symptoms, cancel your procedure and contact your primary care physician for an evaluation.  Remember:  Regular Business hours are:    Monday to Thursday 8:00 AM to 4:00 PM  Provider's  Schedule: Keilen Kahl, MD:  Procedure days: Tuesday and Thursday 7:30 AM to 4:00 PM  Bilal Lateef, MD:  Procedure days: Monday and Wednesday 7:30 AM to 4:00 PM ____________________________________________________________________________________________    

## 2020-03-02 NOTE — Telephone Encounter (Signed)
sw pt made her aware of the d/t slot to complete her UDS....TD

## 2020-03-05 DIAGNOSIS — Z79899 Other long term (current) drug therapy: Secondary | ICD-10-CM | POA: Diagnosis not present

## 2020-03-05 DIAGNOSIS — G894 Chronic pain syndrome: Secondary | ICD-10-CM | POA: Diagnosis not present

## 2020-03-06 ENCOUNTER — Other Ambulatory Visit: Payer: Self-pay

## 2020-03-06 ENCOUNTER — Encounter: Payer: PPO | Attending: Pain Medicine | Admitting: Dietician

## 2020-03-06 ENCOUNTER — Encounter: Payer: Self-pay | Admitting: Dietician

## 2020-03-06 VITALS — Ht 63.0 in | Wt 206.7 lb

## 2020-03-06 DIAGNOSIS — Z6836 Body mass index (BMI) 36.0-36.9, adult: Secondary | ICD-10-CM | POA: Diagnosis not present

## 2020-03-06 DIAGNOSIS — Z713 Dietary counseling and surveillance: Secondary | ICD-10-CM | POA: Insufficient documentation

## 2020-03-06 DIAGNOSIS — E7800 Pure hypercholesterolemia, unspecified: Secondary | ICD-10-CM | POA: Insufficient documentation

## 2020-03-06 DIAGNOSIS — E669 Obesity, unspecified: Secondary | ICD-10-CM | POA: Insufficient documentation

## 2020-03-06 DIAGNOSIS — G4730 Sleep apnea, unspecified: Secondary | ICD-10-CM | POA: Insufficient documentation

## 2020-03-06 NOTE — Patient Instructions (Signed)
   Continue drinking water and if you want other beverages make sure that they are sugar free  Start to find rewards that are not food-related  Control your portions of foods, especially carbohydrates

## 2020-03-06 NOTE — Progress Notes (Signed)
Medical Nutrition Therapy: Visit start time: 0930  end time: 1030  Assessment:  Diagnosis: obesity Past medical history: high cholesterol, sleep apnea Psychosocial issues/ stress concerns: taking medication, discussed potential benefits of therapy  Preferred learning method:  . Auditory . Visual . Hands-on  Current weight: 206.7 lbs  Height: 5'3" Medications, supplements: reconciled in medical record   Progress and evaluation:  Pt is hoping to get a breast reduction.  Pt states consultation for surgery scheduled for some time in May.    Pt reports being in a lot of pain and under a lot of stress on most days.  Pt is frustrated with limited mobility with cane.  Pt reports not liking to take a lot of pain meds because they make her sleepy.  Pt states wanting to lose weight to decrease pain and decrease need for  medications.    Physical activity: none, difficulty completing ADLs, fell at home last Sunday 4/25  Dietary Intake:  Usual eating pattern includes 3 meals and 3+ snacks per day. Dining out frequency: 0 meals per week.  Breakfast: smoothies (Banana or strawberries, mangos, and milk) with granola , corned beef hash with grits Snack: same as below Lunch(not usual): granola bar Snack: same below Supper: brown rice with battered and fried fish/ fried chicken/pork chops/rabbit/squirrel/groundhog/roasted [place] with green beans, carrots, kale, collards, or mustard greens fatback or ham hock/salad with grapes, red pepper, apples, pecans and vinaigrette  Snack: ice cream snicker bars, [employer]'s strawberry and cream pies, shebert, cheese and caramel popcorn, Miss Debby's oatmeal cream pies Beverages: water, flavored seltzer water, coffee with splenda and creamer  Nutrition Care Education:  Basic nutrition: basic food groups, appropriate nutrient balance, appropriate meal and snack schedule, general nutrition guidelines    Weight control: importance of low sugar and low fat choices,  portion control strategies including measuring out food before sitting down to eat Advanced nutrition:  recipe modification, cooking techniques- bake, broil, and saute instead of breading and frying meats Hypertension:  importance of controlling BP, identifying high sodium foods, identifying food sources of potassium, magnesium Hyperlipidemia:  healthy and unhealthy fats, role of fiber, plant sterols Other lifestyle changes:  benefits of making changes, increasing motivation, readiness for change, identifying habits that need to change, alcohol use, food and drug interactions  Nutritional Diagnosis:  [place]-3.3 Overweight/obesity As related to excess calorie intake and inadequate physical activity.  As evidenced by pt current BMI of 36.62.  Intervention:   Discussion and instruction as noted above  Pt has made some changes- drinking more water and decreasing sugary snacks  Established goals for additional change   At follow up, ask how surgery consultation went  Education Materials given:  . 10 Healthy Diet Changes  . Plate Planner with food lists  . Goals/ instructions  Learner/ who was taught:  . Patient   Level of understanding: [name] Verbalizes/ demonstrates competency  Demonstrated degree of understanding via:   Teach back Learning barriers: . None  Willingness to learn/ readiness for change: . Eager, change in progress  Monitoring and Evaluation:  Dietary intake, exercise, and body weight      follow up: June 9 at 9:30am

## 2020-03-09 LAB — TOXASSURE SELECT 13 (MW), URINE

## 2020-03-17 ENCOUNTER — Ambulatory Visit: Payer: PPO | Admitting: Pain Medicine

## 2020-03-19 ENCOUNTER — Other Ambulatory Visit: Payer: Self-pay

## 2020-03-19 ENCOUNTER — Ambulatory Visit (HOSPITAL_BASED_OUTPATIENT_CLINIC_OR_DEPARTMENT_OTHER): Payer: PPO | Admitting: Pain Medicine

## 2020-03-19 ENCOUNTER — Ambulatory Visit: Payer: PPO | Admitting: Pain Medicine

## 2020-03-19 ENCOUNTER — Ambulatory Visit
Admission: RE | Admit: 2020-03-19 | Discharge: 2020-03-19 | Disposition: A | Discharge status: Home / Self Care | Payer: PPO | Source: Ambulatory Visit | Attending: Pain Medicine | Admitting: Pain Medicine

## 2020-03-19 ENCOUNTER — Encounter: Payer: Self-pay | Admitting: Pain Medicine

## 2020-03-19 VITALS — BP 116/86 | HR 84 | Temp 97.2°F | Resp 13 | Ht 63.0 in | Wt 206.0 lb

## 2020-03-19 DIAGNOSIS — M48061 Spinal stenosis, lumbar region without neurogenic claudication: Secondary | ICD-10-CM | POA: Diagnosis not present

## 2020-03-19 DIAGNOSIS — R937 Abnormal findings on diagnostic imaging of other parts of musculoskeletal system: Secondary | ICD-10-CM | POA: Insufficient documentation

## 2020-03-19 DIAGNOSIS — G8929 Other chronic pain: Secondary | ICD-10-CM

## 2020-03-19 DIAGNOSIS — M79605 Pain in left leg: Secondary | ICD-10-CM | POA: Diagnosis not present

## 2020-03-19 DIAGNOSIS — M5136 Other intervertebral disc degeneration, lumbar region: Secondary | ICD-10-CM

## 2020-03-19 DIAGNOSIS — M5410 Radiculopathy, site unspecified: Secondary | ICD-10-CM | POA: Insufficient documentation

## 2020-03-19 DIAGNOSIS — M79604 Pain in right leg: Secondary | ICD-10-CM | POA: Diagnosis not present

## 2020-03-19 DIAGNOSIS — M961 Postlaminectomy syndrome, not elsewhere classified: Secondary | ICD-10-CM | POA: Diagnosis not present

## 2020-03-19 MED ORDER — LACTATED RINGERS IV SOLN
1000.0000 mL | Freq: Once | INTRAVENOUS | Status: AC
  Administered 2020-03-19: 1000 mL via INTRAVENOUS

## 2020-03-19 MED ORDER — DEXAMETHASONE SODIUM PHOSPHATE 10 MG/ML IJ SOLN
10.0000 mg | Freq: Once | INTRAMUSCULAR | Status: AC
  Administered 2020-03-19: 10 mg
  Filled 2020-03-19: qty 1

## 2020-03-19 MED ORDER — ROPIVACAINE HCL 2 MG/ML IJ SOLN
1.0000 mL | Freq: Once | INTRAMUSCULAR | Status: AC
  Administered 2020-03-19: 1 mL via EPIDURAL
  Filled 2020-03-19: qty 10

## 2020-03-19 MED ORDER — SODIUM CHLORIDE 0.9% FLUSH
1.0000 mL | Freq: Once | INTRAVENOUS | Status: AC
  Administered 2020-03-19: 1 mL

## 2020-03-19 MED ORDER — SODIUM CHLORIDE 0.9% FLUSH
2.0000 mL | Freq: Once | INTRAVENOUS | Status: AC
  Administered 2020-03-19: 2 mL

## 2020-03-19 MED ORDER — ROPIVACAINE HCL 2 MG/ML IJ SOLN
2.0000 mL | Freq: Once | INTRAMUSCULAR | Status: AC
  Administered 2020-03-19: 2 mL via EPIDURAL
  Filled 2020-03-19: qty 10

## 2020-03-19 MED ORDER — FENTANYL CITRATE (PF) 100 MCG/2ML IJ SOLN
25.0000 ug | INTRAMUSCULAR | Status: DC | PRN
  Administered 2020-03-19: 50 ug via INTRAVENOUS
  Filled 2020-03-19: qty 2

## 2020-03-19 MED ORDER — LIDOCAINE HCL 2 % IJ SOLN
20.0000 mL | Freq: Once | INTRAMUSCULAR | Status: AC
  Administered 2020-03-19: 400 mg
  Filled 2020-03-19: qty 20

## 2020-03-19 MED ORDER — MIDAZOLAM HCL 5 MG/5ML IJ SOLN
1.0000 mg | INTRAMUSCULAR | Status: DC | PRN
  Administered 2020-03-19: 2 mg via INTRAVENOUS
  Administered 2020-03-19: 1 mg via INTRAVENOUS
  Filled 2020-03-19: qty 5

## 2020-03-19 MED ORDER — IOHEXOL 180 MG/ML  SOLN
10.0000 mL | Freq: Once | INTRAMUSCULAR | Status: AC
  Administered 2020-03-19: 10 mL via EPIDURAL
  Filled 2020-03-19: qty 20

## 2020-03-19 MED ORDER — TRIAMCINOLONE ACETONIDE 40 MG/ML IJ SUSP
40.0000 mg | Freq: Once | INTRAMUSCULAR | Status: AC
  Administered 2020-03-19: 40 mg
  Filled 2020-03-19: qty 1

## 2020-03-19 NOTE — Progress Notes (Signed)
PROVIDER NOTE: Information contained herein reflects review and annotations entered in association with encounter. Interpretation of such information and data should be left to medically-trained personnel. Information provided to patient can be located elsewhere in the medical record under "Patient Instructions". Document created using STT-dictation technology, any transcriptional errors that may result from process are unintentional.    Patient: Jennifer Harmon  Service Category: Procedure  Provider: Oswaldo Done, MD  DOB: [birth year]  DOS: 03/19/2020  Location: [facility]  MRN: [id number]  Setting: Ambulatory - outpatient  Referring Provider: Ignatius Specking, MD  Type: Established Patient  Specialty: Interventional Pain Management  PCP: Ignatius Specking, MD   Primary Reason for Visit: Interventional Pain Management Treatment. CC: Back Pain (low, mid), Neck Pain, and Knee Pain (bilateral)  Procedure #1:  Anesthesia, Analgesia, Anxiolysis:  Type: Diagnostic Trans-Foraminal Epidural Steroid Injection   #1  Region: Lumbar Level: L3 Paravertebral Laterality: Right Paravertebral   Type: Moderate (Conscious) Sedation combined with Local Anesthesia Indication(s): Analgesia and Anxiety Route: Intravenous (IV) IV Access: Secured Sedation: Meaningful verbal contact was maintained at all times during the procedure  Local Anesthetic: Lidocaine 1-2%  Position: Prone  Procedure #2:    Type: Failed attempt at getting an Inter-Laminar Epidural Steroid Injection            Region: Lumbar Level: L3-4 Level. Laterality: Midline          Note: Even though this level was above her surgery, the lamina seem to be fused and the cartilage calcified.  Despite several attempts I was unable to get into the epidural space.  This part of the procedure was aborted.   Indications: 1. Chronic lower extremity pain (Bilateral) (R>L)   2. Chronic lower extremity radicular pain (L5) (Right)   3. DDD  (degenerative disc disease), lumbar   4. Failed back surgical syndrome   5. Lumbar foraminal stenosis (Right) (L3-4)   6. Abnormal MRI, lumbar spine (02/18/2020)    Pain Score: Pre-procedure: 8 /10 Post-procedure: 0-No pain/10   Pre-op Assessment:  Jennifer Harmon is a 59 y.o. (year old), female patient, seen today for interventional treatment. She  has a past surgical history that includes Cholecystectomy; Cesarean section; Back surgery (2008); and Joint replacement (Right, 04/16/2019). Jennifer Harmon has a current medication list which includes the following prescription(s): baclofen, calcium carbonate, vitamin d3, citalopram, clonazepam, cyclobenzaprine, esomeprazole, latanoprost, magnesium oxide, multi-vitamin, mupirocin ointment, oxycodone, [START ON 04/15/2020] oxycodone, [START ON 05/15/2020] oxycodone, pregabalin, and rosuvastatin, and the following Facility-Administered Medications: fentanyl and midazolam. Her primarily concern today is the Back Pain (low, mid), Neck Pain, and Knee Pain (bilateral)  Initial Vital Signs:  Pulse/HCG Rate: 78ECG Heart Rate: 76 Temp: 98.1 F (36.7 C) Resp: 16 BP: (!) 116/54 SpO2: 97 %  BMI: Estimated body mass index is 36.49 kg/m as calculated from the following:   Height as of this encounter:  (1.6 m).   Weight as of this encounter: 206 lb (93.4 kg).  Risk Assessment: Allergies: Reviewed. She is allergic to aspirin; bupropion; ibuprofen; and tape.  Allergy Precautions: None required Coagulopathies: Reviewed. None identified.  Blood-thinner therapy: None at this time Active Infection(s): Reviewed. None identified. Jennifer Harmon is afebrile  Site Confirmation: Jennifer Harmon was asked to confirm the procedure and laterality before marking the site Procedure checklist: Completed Consent: Before the procedure and under the influence of no sedative(s), amnesic(s), or anxiolytics, the patient was informed of the treatment options, risks and possible  complications. To fulfill our  ethical and legal obligations, as recommended by the American Medical Association's Code of Ethics, I have informed the patient of my clinical impression; the nature and purpose of the treatment or procedure; the risks, benefits, and possible complications of the intervention; the alternatives, including doing nothing; the risk(s) and benefit(s) of the alternative treatment(s) or procedure(s); and the risk(s) and benefit(s) of doing nothing. The patient was provided information about the general risks and possible complications associated with the procedure. These may include, but are not limited to: failure to achieve desired goals, infection, bleeding, organ or nerve damage, allergic reactions, paralysis, and death. In addition, the patient was informed of those risks and complications associated to Spine-related procedures, such as failure to decrease pain; infection (i.e.: Meningitis, epidural or intraspinal abscess); bleeding (i.e.: epidural hematoma, subarachnoid hemorrhage, or any other type of intraspinal or peri-dural bleeding); organ or nerve damage (i.e.: Any type of peripheral nerve, nerve root, or spinal cord injury) with subsequent damage to sensory, motor, and/or autonomic systems, resulting in permanent pain, numbness, and/or weakness of one or several areas of the body; allergic reactions; (i.e.: anaphylactic reaction); and/or death. Furthermore, the patient was informed of those risks and complications associated with the medications. These include, but are not limited to: allergic reactions (i.e.: anaphylactic or anaphylactoid reaction(s)); adrenal axis suppression; blood sugar elevation that in diabetics may result in ketoacidosis or comma; water retention that in patients with history of congestive heart failure may result in shortness of breath, pulmonary edema, and decompensation with resultant heart failure; weight gain; swelling or edema; medication-induced  neural toxicity; particulate matter embolism and blood vessel occlusion with resultant organ, and/or nervous system infarction; and/or aseptic necrosis of one or more joints. Finally, the patient was informed that Medicine is not an exact science; therefore, there is also the possibility of unforeseen or unpredictable risks and/or possible complications that may result in a catastrophic outcome. The patient indicated having understood very clearly. We have given the patient no guarantees and we have made no promises. Enough time was given to the patient to ask questions, all of which were answered to the patient's satisfaction. Ms. Winningham has indicated that she wanted to continue with the procedure. Attestation: I, the ordering provider, attest that I have discussed with the patient the benefits, risks, side-effects, alternatives, likelihood of achieving goals, and potential problems during recovery for the procedure that I have provided informed consent. Date  Time: 03/19/2020  8:56 AM  Pre-Procedure Preparation:  Monitoring: As per clinic protocol. Respiration, ETCO2, SpO2, BP, heart rate and rhythm monitor placed and checked for adequate function Safety Precautions: Patient was assessed for positional comfort and pressure points before starting the procedure. Time-out: I initiated and conducted the "Time-out" before starting the procedure, as per protocol. The patient was asked to participate by confirming the accuracy of the "Time Out" information. Verification of the correct person, site, and procedure were performed and confirmed by me, the nursing staff, and the patient. "Time-out" conducted as per Joint Commission's Universal Protocol (UP.01.01.01). Time: 1610  Description of Procedure #1:  Target Area: The inferior and lateral portion of the pedicle, just lateral to a line created by the 6:00 position of the pedicle and the superior articular process of the vertebral body below. On the lateral  view, this target lies just posterior to the anterior aspect of the lamina and posterior to the midpoint created between the anterior and the posterior aspect of the neural foramina. Approach: Posterior paravertebral approach. Area Prepped: Entire Posterior  Lumbosacral Region DuraPrep (Iodine Povacrylex [0.7% available iodine] and Isopropyl Alcohol, 74% w/w) Safety Precautions: Aspiration looking for blood return was conducted prior to all injections. At no point did we inject any substances, as a needle was being advanced. No attempts were made at seeking any paresthesias. Safe injection practices and needle disposal techniques used. Medications properly checked for expiration dates. SDV (single dose vial) medications used.  Description of the Procedure: Protocol guidelines were followed. The patient was placed in position over the fluoroscopy table. The target area was identified and the area prepped in the usual manner. Skin & deeper tissues infiltrated with local anesthetic. Appropriate amount of time allowed to pass for local anesthetics to take effect. The procedure needles were then advanced to the target area. Proper needle placement secured. Negative aspiration confirmed. Solution injected in intermittent fashion, asking for systemic symptoms every 0.2cc of injectate. The needles were then removed and the area cleansed, making sure to leave some of the prepping solution back to take advantage of its long term bactericidal properties.  Start Time: 0949 hrs.  Materials:  Needle(s) Type: Spinal Needle Gauge: 22G Length: 3.5-in Medication(s): Please see orders for medications and dosing details.  Description of Procedure #2: (Aborted procedure)  Target Area: The  interlaminar space, initially targeting the lower border of the superior vertebral body lamina. Approach: Posterior paramedial approach. Area Prepped: Same as above Prepping solution: Same as above Safety Precautions: Same as  above  Description of the Procedure: Despite several attempts at entering the epidural space interlaminar early, it became very obvious that the level is fused and the cartilage part of the thigh.  I was unable to enter the epidural space.  Because of this and after approximately 10 minutes of trying, the procedure was aborted since the patient was rather uncomfortable.  The local anesthetic and steroid was injected outside of the lamina to help with the discomfort from the needle sticks and a failed attempt.  Vitals:   03/19/20 1029 03/19/20 1033 03/19/20 1039 03/19/20 1043  BP:  130/75  116/86  Pulse:      Resp:  11  13  Temp:    (!) 97.2 F (36.2 C)  TempSrc:    Temporal  SpO2: (!) 88% 100% 95% 96%  Weight:      Height:        End Time: 1016 hrs.  Materials:  Needle(s) Type: Epidural needle Gauge: 17G Length: 3.5-in Medication(s): Please see orders for medications and dosing details.  Imaging Guidance (Spinal):          Type of Imaging Technique: Fluoroscopy Guidance (Spinal) Indication(s): Assistance in needle guidance and placement for procedures requiring needle placement in or near specific anatomical locations not easily accessible without such assistance. Exposure Time: Please see nurses notes. Contrast: Before injecting any contrast, we confirmed that the patient did not have an allergy to iodine, shellfish, or radiological contrast. Once satisfactory needle placement was completed at the desired level, radiological contrast was injected. Contrast injected under live fluoroscopy. No contrast complications. See chart for type and volume of contrast used. Fluoroscopic Guidance: I was personally present during the use of fluoroscopy. "Tunnel Vision Technique" used to obtain the best possible view of the target area. Parallax error corrected before commencing the procedure. "Direction-depth-direction" technique used to introduce the needle under continuous pulsed fluoroscopy. Once  target was reached, antero-posterior, oblique, and lateral fluoroscopic projection used confirm needle placement in all planes. Images permanently stored in EMR. Interpretation: I personally interpreted the  imaging intraoperatively. Adequate needle placement confirmed in multiple planes. Appropriate spread of contrast into desired area was observed. No evidence of afferent or efferent intravascular uptake. No intrathecal or subarachnoid spread observed. Permanent images saved into the patient's record.  Antibiotic Prophylaxis:   Anti-infectives (From admission, onward)   None     Indication(s): None identified  Post-operative Assessment:  Post-procedure Vital Signs:  Pulse/HCG Rate: 8478 Temp: (!) 97.2 F (36.2 C) Resp: 13 BP: 116/86 SpO2: 96 %  EBL: None  Complications: No immediate post-treatment complications observed by team, or reported by patient.  Note: The patient tolerated the entire procedure well. A repeat set of vitals were taken after the procedure and the patient was kept under observation following institutional policy, for this type of procedure. Post-procedural neurological assessment was performed, showing return to baseline, prior to discharge. The patient was provided with post-procedure discharge instructions, including a section on how to identify potential problems. Should any problems arise concerning this procedure, the patient was given instructions to immediately contact us, at any time, without hesitation. In any case, we plan to contact the patient by telephone for a follow-up status report regarding this interventional procedure.  Comments:  No additional relevant information.  Plan of Care  Orders:  Orders Placed This Encounter  Procedures  . Lumbar Epidural Injection    Scheduling Instructions:     Procedure: Interlaminar LESI L3-4     Laterality: Midline     Sedation: Patient's choice     Timeframe:  Today    Order Specific Question:   Where will  this procedure be performed?    Answer:   [facility]  . Lumbar Transforaminal Epidural    Scheduling Instructions:     Side: Right-sided     Level: L3     Sedation: With Sedation.     Timeframe: Today    Order Specific Question:   Where will this procedure be performed?    Answer:   [facility]  . DG PAIN CLINIC C-ARM 1-60 MIN NO REPORT    Intraoperative interpretation by procedural physician at [facility].    Standing Status:   Standing    Number of Occurrences:   1    Order Specific Question:   Reason for exam:    Answer:   Assistance in needle guidance and placement for procedures requiring needle placement in or near specific anatomical locations not easily accessible without such assistance.  . Informed Consent Details: Physician/Practitioner Attestation; Transcribe to consent form and obtain patient signature    Provider Attestation: I, Teka Chanda A. Laban Emperor, MD, (Pain Management Specialist), the physician/practitioner, attest that I have discussed with the patient the benefits, risks, side effects, alternatives, likelihood of achieving goals and potential problems during recovery for the procedure that I have provided informed consent.    Scheduling Instructions:     Procedure: Lumbar epidural steroid injection under fluoroscopic guidance     Indications: Low back and/or lower extremity pain secondary to lumbar radiculitis     Note: Always confirm laterality of pain with Ms. Spelman, before procedure.     Transcribe to consent form and obtain patient signature.  . Informed Consent Details: Physician/Practitioner Attestation; Transcribe to consent form and obtain patient signature    Provider Attestation: I, Neshawn Aird A. Laban Emperor, MD, (Pain Management Specialist), the physician/practitioner, attest that I have discussed with the patient the benefits, risks, side effects, alternatives, likelihood of achieving goals and potential problems during recovery for  the procedure that  I have provided informed consent.    Scheduling Instructions:     Procedure: Diagnostic lumbar transforaminal epidural steroid injection under fluoroscopic guidance. (See notes for level and laterality.)     Indication/Reason: Lumbar radiculopathy/radiculitis associated with lumbar stenosis     Note: Always confirm laterality of pain with Ms. Crosland, before procedure.     Transcribe to consent form and obtain patient signature.  . Provide equipment / supplies at bedside    Equipment required: Single use, disposable, "Epidural Tray" Epidural Catheter: NOT required    Standing Status:   Standing    Number of Occurrences:   1    Order Specific Question:   Specify    Answer:   Epidural Tray   Chronic Opioid Analgesic:  Oxycodone IR 5 mg, 1 tab PO q 8 hrs (15 mg/day of oxycodone) MME/day: 22.5 mg/day.   Medications ordered for procedure: Meds ordered this encounter  Medications  . iohexol (OMNIPAQUE) 180 MG/ML injection 10 mL    Must be Myelogram-compatible. If not available, you may substitute with a water-soluble, non-ionic, hypoallergenic, myelogram-compatible radiological contrast medium.  [name] lidocaine (XYLOCAINE) 2 % (with pres) injection 400 mg  . lactated ringers infusion 1,000 mL  . midazolam (VERSED) 5 MG/5ML injection 1-2 mg    Make sure Flumazenil is available in the pyxis when using this medication. If oversedation occurs, administer 0.2 mg IV over 15 sec. If after 45 sec no response, administer 0.2 mg again over 1 min; may repeat at 1 min intervals; not to exceed 4 doses (1 mg)  . fentaNYL (SUBLIMAZE) injection 25-50 mcg    Make sure Narcan is available in the pyxis when using this medication. In the event of respiratory depression (RR< 8/min): Titrate NARCAN (naloxone) in increments of 0.1 to 0.2 mg IV at 2-3 minute intervals, until desired degree of reversal.  . sodium chloride flush (NS) 0.9 % injection 2 mL  . ropivacaine (PF) 2 mg/mL (0.2%) (NAROPIN)  injection 2 mL  . triamcinolone acetonide (KENALOG-40) injection 40 mg  . sodium chloride flush (NS) 0.9 % injection 1 mL  . ropivacaine (PF) 2 mg/mL (0.2%) (NAROPIN) injection 1 mL  . dexamethasone (DECADRON) injection 10 mg   Medications administered: We administered iohexol, lidocaine, lactated ringers, midazolam, fentaNYL, sodium chloride flush, ropivacaine (PF) 2 mg/mL (0.2%), triamcinolone acetonide, sodium chloride flush, ropivacaine (PF) 2 mg/mL (0.2%), and dexamethasone.  See the medical record for exact dosing, route, and time of administration.  Follow-up plan:   Return in about 2 weeks (around 04/02/2020) for (VV), (PP).       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  Unable to enter space despite multiple attempts on 03/19/2020.   Considering:   Diagnostic Trigger point injections Diagnostic midline caudal ESI  Possible Racz procedure (epidural Neurolysis) Diagnostic thoracic facet block Possible thoracic facet RFA Diagnostic bilateral CESI Diagnosticbilateral cervical facet block Possiblebilateral cervical facet RFA Diagnostic right IA shoulder injection Diagnostic right suprascapular NB Possibleright suprascapular RFA Diagnostic left genicular NB Possible bilateral genicular RFA   Palliative PRN treatment(s):   Palliative right T6-7 TESI #2  Palliative bilateral lumbar facet block #4  Palliative right SI block #3  Palliative right IA hip joint injections  Palliative right lumbar facet RFA #2 (last done 07/25/2019)  Palliative right SI joint RFA #2 (last done 05/03/2018)  Palliative left  lumbar facet RFA #2 (last done 09/10/2019)  Palliative right genicular NB #  2     Recent Visits Date Type Provider Dept  03/02/20 Telemedicine Delano Metz, MD [facility]  02/04/20 Office Visit Delano Metz, MD [facility]   Showing recent visits within past 90 days and meeting all other requirements   Today's Visits Date Type Provider Dept  03/19/20 Procedure visit Delano Metz, MD [facility]  Showing today's visits and meeting all other requirements   Future Appointments Date Type Provider Dept  04/01/20 Appointment Delano Metz, MD [facility]  06/09/20 Appointment Delano Metz, MD [facility]  Showing future appointments within next 90 days and meeting all other requirements   Disposition: Discharge home  Discharge (Date  Time): 03/19/2020; 1047 hrs.   Primary Care Physician: Ignatius Specking, MD Location: [facility] Note by: Oswaldo Done, MD Date: 03/19/2020; Time: 11:09 AM  Disclaimer:  Medicine is not an Visual merchandiser. The only guarantee in medicine is that nothing is guaranteed. It is important to note that the decision to proceed with this intervention was based on the information collected from the patient. The Data and conclusions were drawn from the patient's questionnaire, the interview, and the physical examination. Because the information was provided in large part by the patient, it cannot be guaranteed that it has not been purposely or unconsciously manipulated. Every effort has been made to obtain as much relevant data as possible for this evaluation. It is important to note that the conclusions that lead to this procedure are derived in large part from the available data. Always take into account that the treatment will also be dependent on availability of resources and existing treatment guidelines, considered by other Pain Management Practitioners as being common knowledge and practice, at the time of the intervention. For Medico-Legal purposes, it is also important to point out that variation in procedural techniques and pharmacological choices are the acceptable norm. The indications, contraindications,  technique, and results of the above procedure should only be interpreted and judged by a Board-Certified Interventional Pain Specialist with extensive familiarity and expertise in the same exact procedure and technique.

## 2020-03-19 NOTE — Progress Notes (Signed)
Safety precautions to be maintained throughout the outpatient stay will include: orient to surroundings, keep bed in low position, maintain call bell within reach at all times, provide assistance with transfer out of bed and ambulation.  

## 2020-03-19 NOTE — Patient Instructions (Signed)

## 2020-03-20 ENCOUNTER — Telehealth: Payer: Self-pay

## 2020-03-20 NOTE — Telephone Encounter (Signed)
Post procedure phone call.  LM 

## 2020-03-24 ENCOUNTER — Encounter: Payer: Self-pay | Admitting: Psychology

## 2020-03-25 ENCOUNTER — Ambulatory Visit (INDEPENDENT_AMBULATORY_CARE_PROVIDER_SITE_OTHER): Payer: PPO | Admitting: Plastic Surgery

## 2020-03-25 ENCOUNTER — Encounter: Payer: Self-pay | Admitting: Plastic Surgery

## 2020-03-25 ENCOUNTER — Other Ambulatory Visit: Payer: Self-pay

## 2020-03-25 VITALS — BP 119/76 | HR 87 | Temp 96.9°F | Ht 63.0 in | Wt 202.2 lb

## 2020-03-25 DIAGNOSIS — M4004 Postural kyphosis, thoracic region: Secondary | ICD-10-CM | POA: Diagnosis not present

## 2020-03-25 DIAGNOSIS — M546 Pain in thoracic spine: Secondary | ICD-10-CM | POA: Diagnosis not present

## 2020-03-25 DIAGNOSIS — M545 Low back pain, unspecified: Secondary | ICD-10-CM

## 2020-03-25 DIAGNOSIS — N62 Hypertrophy of breast: Secondary | ICD-10-CM | POA: Diagnosis not present

## 2020-03-25 NOTE — Progress Notes (Signed)
Referring Provider Ignatius Specking, MD [address] [place],  [place] [address]   CC:  Chief Complaint  Patient presents with  . Consult    BL breast reduction      Jennifer Harmon is an 59 y.o. female.  HPI: Patient is here discuss breast reduction.  She is had years of severe back pain, neck pain, shoulder grooving and rashes beneath her breast.  She goes to a pain management clinic for her back pain and was actually sent here by them.  She is currently having to buy specialty bras and wants to be around a double or triple D.  She has tried nonsteroidal anti-inflammatories, hot packs, cold packs, over-the-counter medications for rashes with very little relief.  She has been to physical therapy for her back in the past.  She used to smoke cigarettes but no longer does.  She has no family history of breast cancer.  Her most recent mammogram was done a few months ago and have always been negative.  Allergies  Allergen Reactions  . Aspirin Other (See Comments)    Ulcers ulcers Ulcers  . Bupropion     sleepiness  . Ibuprofen Other (See Comments)  . Tape     Silk tape     Outpatient Encounter Medications as of 03/25/2020  Medication Sig Note  . baclofen (LIORESAL) 10 MG tablet Take 1-2 tablets (10-20 mg total) by mouth at bedtime. 02/04/2020: FUTURE Prescription. (NOT a DUPLICATE!!) >>>DO NOT DELETE<<< (even if Expired!) See Care Coordination Note from [facility] (Dr. Laban Emperor)  . calcium carbonate (CALCIUM 600) 600 MG TABS tablet Take 1 tablet (600 mg total) by mouth 2 (two) times daily with a meal.   . Cholecalciferol (VITAMIN D3) 125 MCG (5000 UT) CAPS Take 1 capsule (5,000 Units total) by mouth daily with breakfast. Take along with calcium and magnesium.   . citalopram (CELEXA) 20 MG tablet Take 20 mg by mouth daily.   . clonazePAM (KLONOPIN) 1 MG tablet Take 1 mg by mouth daily.    . cyclobenzaprine (FLEXERIL) 10 MG tablet Take 1 tablet (10 mg total) by mouth 3 (three) times  daily. 02/04/2020: FUTURE Prescription. (NOT a DUPLICATE!!) >>>DO NOT DELETE<<< (even if Expired!) See Care Coordination Note from [facility] (Dr. Laban Emperor)  . esomeprazole (NEXIUM) 40 MG capsule Take 1 capsule (40 mg total) by mouth at bedtime. Take while on ibuprofen. 02/04/2020: FUTURE Prescription. (NOT a DUPLICATE!!) >>>DO NOT DELETE<<< (even if Expired!) See Care Coordination Note from [facility] (Dr. Laban Emperor)  . latanoprost (XALATAN) 0.005 % ophthalmic solution Place 1 drop into both eyes daily.   . Magnesium Oxide 500 MG CAPS Take 1 capsule (500 mg total) by mouth at bedtime.   . Multiple Vitamin (MULTI-VITAMIN) tablet Take by mouth.   . mupirocin ointment (BACTROBAN) 2 % Apply 1 application topically as needed.   [name] oxyCODONE (OXY IR/ROXICODONE) 5 MG immediate release tablet Take 1 tablet (5 mg total) by mouth every 8 (eight) hours as needed for severe pain. Must last 30 days 02/04/2020: FUTURE Prescription. (NOT a DUPLICATE!!) >>>DO NOT DELETE<<< (even if Expired!) See Care Coordination Note from [facility] (Dr. Laban Emperor)  . [START ON 04/15/2020] oxyCODONE (OXY IR/ROXICODONE) 5 MG immediate release tablet Take 1 tablet (5 mg total) by mouth every 8 (eight) hours as needed for severe pain. Must last 30 days 02/04/2020: FUTURE Prescription. (NOT a DUPLICATE!!) >>>DO NOT DELETE<<< (even if Expired!) See Care Coordination Note from [facility] Management (Dr. Dossie Arbour)  . [START ON 05/15/2020] oxyCODONE (OXY IR/ROXICODONE) 5 MG immediate release tablet Take 1 tablet (5 mg total) by mouth every 8 (eight) hours as needed for severe pain. Must last 30 days   . pregabalin (LYRICA) 150 MG capsule Take 1 capsule (150 mg total) by mouth 3 (three) times daily.   . rosuvastatin (CRESTOR) 10 MG tablet Take 10 mg by mouth.     No facility-administered encounter medications on file as of 03/25/2020.     Past Medical History:  Diagnosis Date  . Acute postoperative pain 05/03/2018  . Anxiety    . Chronic chest pain   . Depression   . DJD (degenerative joint disease)   . Fibromyalgia   . GERD (gastroesophageal reflux disease)   . Hypercholesterolemia     Past Surgical History:  Procedure Laterality Date  . BACK SURGERY  2008  . CESAREAN SECTION    . CHOLECYSTECTOMY    . JOINT REPLACEMENT Right 04/16/2019   knee    Family History  Problem Relation Age of Onset  . Hypertension Mother   . Arthritis Father     Social History   Social History Narrative  . Not on file  Denies tobacco use  Review of Systems General: Denies fevers, chills, weight loss CV: Denies chest pain, shortness of breath, palpitations  Physical Exam Vitals with BMI 03/25/2020 03/19/2020 03/19/2020  Height 5\' 3"  - -  Weight 202 lbs 3 oz - -  BMI 01.09 - -  Systolic 323 557 322  Diastolic 76 86 75  Pulse 87 - -    General:  No acute distress,  Alert and oriented, Non-Toxic, Normal speech and affect Breast: She has grade 3 ptosis.  Sternal notch to nipple is 51 on the right and 49 cm on the left.  Nipple to fold is 23 cm bilaterally.  I do not see any obvious scars or masses.  She has obvious shoulder grooving.  Assessment/Plan The patient has bilateral symptomatic macromastia.  She is a good candidate for a breast reduction.  She is interested in pursuing surgical treatment.  The details of breast reduction surgery were discussed.  I explained the procedure in detail along the with the expected scars.  The risks were discussed in detail and include bleeding, infection, damage to surrounding structures, need for additional procedures, nipple loss, change in nipple sensation, persistent pain, contour irregularities and asymmetries.  I explained that breast feeding is often not possible after breast reduction surgery.  We discussed the expected postoperative course with an overall recovery period of about 1 month.  She demonstrated full understanding of all risks.  We discussed her personal risk factors  that include the abnormal length of her breast.  I explained to her that the only safe way to do a reduction would be a free nipple graft.  I explained the details of the free nipple graft procedure including the expected lack of sensation to the nipple areolar complex and the likely changes in skin color that she could anticipate.  She is fully aware of these facts and is in agreement that free nipple graft is the right choice for her.  I anticipate approximately 1500 g of tissue removed from each side.   Cindra Presume 03/25/2020, 11:41 AM

## 2020-03-30 NOTE — Progress Notes (Signed)
Patient: Jennifer Harmon  Service Category: E/M  Provider: Gaspar Cola, MD  DOB: Jan 08, 1961  DOS: 04/01/2020  Location: Office  MRN: [id number]  Setting: Ambulatory outpatient  Referring Provider: Glenda Chroman, MD  Type: Established Patient  Specialty: Interventional Pain Management  PCP: Glenda Chroman, MD  Location: Remote location  Delivery: TeleHealth     Virtual Encounter - Pain Management PROVIDER NOTE: Information contained herein reflects review and annotations entered in association with encounter. Interpretation of such information and data should be left to medically-trained personnel. Information provided to patient can be located elsewhere in the medical record under "Patient Instructions". Document created using STT-dictation technology, any transcriptional errors that may result from process are unintentional.    Contact & Pharmacy Preferred: [place]: [phone] (home) Mobile: [phone] (mobile) E-mail: No e-mail address on record  Jennifer Harmon, [name] Phone: [phone] Fax: [phone]   Pre-screening  Jennifer Harmon offered "in-person" vs "virtual" encounter. She indicated preferring virtual for this encounter.   Reason COVID-19*  Social distancing based on CDC and AMA recommendations.   I contacted Jennifer Harmon on 04/01/2020 via telephone.      I clearly identified myself as Gaspar Cola, MD. I verified that I was speaking with the correct person using two identifiers (Name: Jennifer Harmon, and date of birth: June 02, 1961).  Consent I sought verbal advanced consent from Jennifer Harmon for virtual visit interactions. I informed Jennifer Harmon of possible security and privacy concerns, risks, and limitations associated with providing "not-in-person" medical evaluation and management services. I also informed Jennifer Harmon of the availability of "in-person" appointments.  Finally, I informed her that there would be a charge for the virtual visit and that she could be  personally, fully or partially, financially responsible for it. Jennifer Harmon expressed understanding and agreed to proceed.   Historic Elements   [name] is a 59 y.o. year old, female patient evaluated today after her last contact with our practice on 03/20/2020. Jennifer Harmon  has a past medical history of Acute postoperative pain (05/03/2018), Anxiety, Chronic chest pain, Depression, DJD (degenerative joint disease), Fibromyalgia, GERD (gastroesophageal reflux disease), and Hypercholesterolemia. She also  has a past surgical history that includes Cholecystectomy; Cesarean section; Back surgery (2008); and Joint replacement (Right, 04/16/2019). Jennifer Harmon has a current medication list which includes the following prescription(s): baclofen, calcium carbonate, vitamin d3, citalopram, clonazepam, cyclobenzaprine, esomeprazole, latanoprost, magnesium oxide, multi-vitamin, mupirocin ointment, oxycodone, [START ON 04/15/2020] oxycodone, [START ON 05/15/2020] oxycodone, [START ON 04/20/2020] pregabalin, and rosuvastatin. She  reports that she quit smoking about 4 years ago. Her smoking use included pipe and cigarettes. She has a 1.50 pack-year smoking history. She uses smokeless tobacco. She reports that she does not drink alcohol or use drugs. Jennifer Harmon is allergic to aspirin; bupropion; ibuprofen; and tape.   HPI  Today, she is being contacted for both, medication management and a post-procedure assessment. The patient indicates doing well with the current medication regimen. No adverse reactions or side effects reported to the medications.  Today we have provided the patient with some refills on her membrane stabilizer.  In addition, we have reviewed the results of her recent right-sided L3 transforaminal LESI.  On 03/19/2020 we did a right-sided L3 transforaminal LESI and we attempted to do a midline L3-4  interlaminar LESI.  Unfortunately, we failed to complete the interlaminar injection since  we found the area to be calcified.  I was unable to go through the calcified ligament and therefore the only injection that we were able to complete was that of the right L3 transforaminal.  Today the patient indicates that she attained 70% relief of her pain on the right side but only 30-40% relief on the left side.  Today I took the time to explain to the patient the reason for those results and the fact that we were unable to complete that L3-4 interlaminar injection.  We also talked about the possible treatment alternatives for this low back pain, but she brought my attention to the fact that she was having quite a bit of neck pain and right upper extremity pain that she wanted addressed.  Post-Procedure Evaluation  Procedure: Diagnostic right L3 transforaminal LESI #1 under fluoroscopic guidance and IV sedation Pre-procedure pain level: 8/10 Post-procedure: 0/10 (100% relief)  Sedation: Sedation provided.  Effectiveness during initial hour after procedure(Ultra-Short Term Relief): 100 % .  Local anesthetic used: Long-acting (4-6 hours) Effectiveness: Defined as any analgesic benefit obtained secondary to the administration of local anesthetics. This carries significant diagnostic value as to the etiological location, or anatomical origin, of the pain. Duration of benefit is expected to coincide with the duration of the local anesthetic used.  Effectiveness during initial 4-6 hours after procedure(Short-Term Relief): 100 % .  Long-term benefit: Defined as any relief past the pharmacologic duration of the local anesthetics.  Effectiveness past the initial 6 hours after procedure(Long-Term Relief): (right 70 percent, left side 30-40 percent).  Current benefits: Defined as benefit that persist at this time.   Analgesia:  >50% relief Function: Jennifer Harmon reports improvement in function ROM: Jennifer Harmon  reports improvement in ROM  Pharmacotherapy Assessment  Analgesic: Oxycodone IR 5 mg, 1 tab PO q 8 hrs (15 mg/day of oxycodone) MME/day: 22.5 mg/day.   Monitoring: [place] PMP: PDMP reviewed during this encounter.       Pharmacotherapy: No side-effects or adverse reactions reported. Compliance: No problems identified. Effectiveness: Clinically acceptable. Plan: Refer to "POC".  UDS:  Summary  Date Value Ref Range Status  03/05/2020 Note  Final    Comment:    ==================================================================== [facility] (MW) ==================================================================== Test                             Result       Flag       Units Drug Present   7-aminoclonazepam              817                     ng/mg creat    7-aminoclonazepam is an expected metabolite of clonazepam. Source of    clonazepam is a scheduled prescription medication.   Oxycodone                      1431                    ng/mg creat   Oxymorphone                    806                     ng/mg creat   Noroxycodone  2398                    ng/mg creat   Noroxymorphone                 150                     ng/mg creat    Sources of oxycodone are scheduled prescription medications.    Oxymorphone, noroxycodone, and noroxymorphone are expected    metabolites of oxycodone. Oxymorphone is also available as a    scheduled prescription medication. ==================================================================== Test                      Result    Flag   Units      Ref Range   Creatinine              52               mg/dL      >=20 ==================================================================== Declared Medications:  Medication list was not provided. ==================================================================== For clinical consultation, please call [phone]. ====================================================================    Laboratory Chemistry Profile   Renal Lab Results  Component Value Date   BUN 12 11/05/2019   CREATININE 0.53 11/05/2019   GFRAA >60 11/05/2019   GFRNONAA >60 11/05/2019     Hepatic Lab Results  Component Value Date   AST 17 11/05/2019   ALT 20 11/05/2019   ALBUMIN 4.0 11/05/2019   ALKPHOS 109 11/05/2019     Electrolytes Lab Results  Component Value Date   NA 141 11/05/2019   K 3.7 11/05/2019   CL 102 11/05/2019   CALCIUM 9.3 11/05/2019   MG 2.2 11/05/2019     Bone Lab Results  Component Value Date   VD25OH 21.15 (L) 11/05/2019     Inflammation (CRP: Acute Phase) (ESR: Chronic Phase) Lab Results  Component Value Date   CRP 0.6 11/05/2019   ESRSEDRATE 21 11/05/2019       Note: Above Lab results reviewed.  Imaging  DG PAIN CLINIC C-ARM 1-60 MIN NO REPORT Fluoro was used, but no Radiologist interpretation will be provided.  Please refer to "NOTES" tab for provider progress note.  Assessment  The primary encounter diagnosis was Chronic pain syndrome. Diagnoses of Chronic upper extremity pain (Bilateral) (R>L), Chronic low back pain  (Secondary Area of Pain) (Bilateral) (R>L), Chronic neck pain (Tertiary Area of Pain) (Bilateral) (R>L), Pharmacologic therapy, Neurogenic pain, and Cervical spondylosis with radiculopathy (Right) were also pertinent to this visit.  Plan of Care  Problem-specific:  No problem-specific Assessment & Plan notes found for this encounter.  [name] has a current medication list which includes the following long-term medication(s): baclofen, calcium carbonate, clonazepam, cyclobenzaprine, esomeprazole, magnesium oxide, oxycodone, [START ON 04/15/2020] oxycodone, [START ON 05/15/2020] oxycodone, [START ON 04/20/2020] pregabalin, and rosuvastatin.  Pharmacotherapy (Medications Ordered): Meds ordered this encounter  Medications  . pregabalin (LYRICA)  150 MG capsule    Sig: Take 1 capsule (150 mg total) by mouth 3 (three) times daily.    Dispense:  90 capsule    Refill:  5    Fill one day early if pharmacy is closed on scheduled refill date. May substitute for generic if available.   Orders:  Orders Placed This Encounter  Procedures  . Cervical Epidural Injection    Level(s): C7-T1 Laterality: Right-sided Purpose: Diagnostic Indication(s): Radiculitis and cervicalgia associater with cervical degenerative disc disease.  Standing Status:   Future    Standing Expiration Date:   05/02/2020    Scheduling Instructions:     Procedure: Cervical Epidural Steroid Injection/Block     Sedation: With Sedation.     Timeframe: As soon as schedule allows    Order Specific Question:   Where will this procedure be performed?    Answer:   [facility]    Comments:   by Dr. Dossie Arbour   Follow-up plan:   Return for Procedure (w/ sedation): (R) CESI #1.      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  Unable to enter space despite multiple attempts on 03/19/2020.   Considering:   Diagnostic Trigger point injections Diagnostic midline caudal ESI  Possible Racz procedure (epidural Neurolysis) Diagnostic thoracic facet block Possible thoracic facet RFA Diagnostic right-sided CESI #1  Diagnosticbilateral cervical facet block Possiblebilateral cervical facet RFA Diagnostic right IA shoulder injection Diagnostic right suprascapular NB Possibleright suprascapular RFA Diagnostic left genicular NB Possible bilateral genicular RFA   Palliative PRN treatment(s):   Palliative right T6-7 TESI #2  Palliative bilateral lumbar facet block #4  Palliative right SI block #3  Palliative right IA hip joint injections  Palliative right lumbar facet RFA #2 (last done 07/25/2019)  Palliative right SI joint RFA #2 (last done  05/03/2018)  Palliative left  lumbar facet RFA #2 (last done 09/10/2019)  Palliative right genicular NB #2     Recent Visits Date Type Provider Dept  03/19/20 Procedure visit Milinda Pointer, MD [facility]  03/02/20 Telemedicine Milinda Pointer, [facility]  02/04/20 Office Visit Milinda Pointer, MD [facility]  Showing recent visits within past 90 days and meeting all other requirements   Today's Visits Date Type Provider Dept  04/01/20 Telemedicine Milinda Pointer, MD [facility]  Showing today's visits and meeting all other requirements   Future Appointments Date Type Provider Dept  06/09/20 Appointment Milinda Pointer, MD [facility]  Showing future appointments within next 90 days and meeting all other requirements   I discussed the assessment and treatment plan with the patient. The patient was provided an opportunity to ask questions and all were answered. The patient agreed with the plan and demonstrated an understanding of the instructions.  Patient advised to call back or seek an in-person evaluation if the symptoms or condition worsens.  Duration of encounter: 22 minutes.  Note by: Gaspar Cola, MD Date: 04/01/2020; Time: 2:06 PM

## 2020-04-01 ENCOUNTER — Other Ambulatory Visit: Payer: Self-pay

## 2020-04-01 ENCOUNTER — Ambulatory Visit: Payer: PPO | Attending: Pain Medicine | Admitting: Pain Medicine

## 2020-04-01 DIAGNOSIS — M5442 Lumbago with sciatica, left side: Secondary | ICD-10-CM | POA: Diagnosis not present

## 2020-04-01 DIAGNOSIS — G894 Chronic pain syndrome: Secondary | ICD-10-CM | POA: Diagnosis not present

## 2020-04-01 DIAGNOSIS — M542 Cervicalgia: Secondary | ICD-10-CM | POA: Diagnosis not present

## 2020-04-01 DIAGNOSIS — M792 Neuralgia and neuritis, unspecified: Secondary | ICD-10-CM | POA: Diagnosis not present

## 2020-04-01 DIAGNOSIS — M4722 Other spondylosis with radiculopathy, cervical region: Secondary | ICD-10-CM

## 2020-04-01 DIAGNOSIS — M5441 Lumbago with sciatica, right side: Secondary | ICD-10-CM

## 2020-04-01 DIAGNOSIS — G8929 Other chronic pain: Secondary | ICD-10-CM

## 2020-04-01 DIAGNOSIS — M79602 Pain in left arm: Secondary | ICD-10-CM

## 2020-04-01 DIAGNOSIS — M79601 Pain in right arm: Secondary | ICD-10-CM | POA: Diagnosis not present

## 2020-04-01 DIAGNOSIS — Z79899 Other long term (current) drug therapy: Secondary | ICD-10-CM

## 2020-04-01 MED ORDER — PREGABALIN 150 MG PO CAPS: 150.0000 mg | ORAL_CAPSULE | Freq: Three times a day (TID) | ORAL | 5 refills | Status: DC

## 2020-04-06 DIAGNOSIS — K219 Gastro-esophageal reflux disease without esophagitis: Secondary | ICD-10-CM | POA: Diagnosis not present

## 2020-04-06 DIAGNOSIS — F419 Anxiety disorder, unspecified: Secondary | ICD-10-CM | POA: Diagnosis not present

## 2020-04-07 ENCOUNTER — Other Ambulatory Visit (HOSPITAL_COMMUNITY): Payer: PPO

## 2020-04-07 ENCOUNTER — Other Ambulatory Visit: Payer: Self-pay

## 2020-04-07 ENCOUNTER — Ambulatory Visit (INDEPENDENT_AMBULATORY_CARE_PROVIDER_SITE_OTHER): Payer: PPO | Admitting: Surgical

## 2020-04-07 ENCOUNTER — Encounter: Payer: Self-pay | Admitting: Surgical

## 2020-04-07 VITALS — BP 119/73 | HR 86 | Temp 97.3°F | Ht 63.0 in | Wt 202.0 lb

## 2020-04-07 DIAGNOSIS — M545 Low back pain, unspecified: Secondary | ICD-10-CM

## 2020-04-07 DIAGNOSIS — M546 Pain in thoracic spine: Secondary | ICD-10-CM

## 2020-04-07 DIAGNOSIS — F17299 Nicotine dependence, other tobacco product, with unspecified nicotine-induced disorders: Secondary | ICD-10-CM

## 2020-04-07 DIAGNOSIS — M4004 Postural kyphosis, thoracic region: Secondary | ICD-10-CM

## 2020-04-07 DIAGNOSIS — N62 Hypertrophy of breast: Secondary | ICD-10-CM

## 2020-04-07 NOTE — Progress Notes (Addendum)
Patient ID: Jennifer Harmon, female    DOB: [birth year], 60 y.o.   MRN: [id number]  Chief Complaint  Patient presents with  . Pre-op Exam    for (B) breast reduction      ICD-10-CM   1. Macromastia  N62   2. Back pain of thoracolumbar region  M54.5    M54.6   3. Postural kyphosis, thoracic region  M40.04   4. Other tobacco product nicotine dependence with nicotine-induced disorder  F17.299      History of Present Illness: Jennifer Harmon is a 59 y.o.  female  with a history of enlarged breasts.  She presents for preoperative evaluation for upcoming procedure, mammary reduction via free nipple graft, scheduled for 04/10/20 with Dr. Arita Miss.  She is here with her husband today.  The patient has not had problems with anesthesia.  No personal history of DVT or PE.  She does report her father had a history of a lower extremity DVT.  No other family history of DVT or PE.  No family or personal history of bleeding or clotting disorders.  Patient is not taking any blood thinners.  Patient notes that she quit smoking many years ago, she does report that she is currently vaping with 1% nicotine fluid.  Summary of Previous Visit: Multiple years of severe back pain, neck pain, shoulder grooving and rashes beneath breasts. Pain management for chronic back pain. Would like to be around a double or triple D. She has had PT in the past. Plan for removal of 1500 grams of tissue from each side.  PMH Significant for: Chronic back pain - managed by pain management. GERD, DJD, Fibromyalgia.    Past Medical History: Allergies: Allergies  Allergen Reactions  . Aspirin Other (See Comments)    Ulcers ulcers Ulcers  . Bupropion     sleepiness  . Ibuprofen Other (See Comments)  . Tape     Silk tape     Current Medications:  Current Outpatient Medications:  .  baclofen (LIORESAL) 10 MG tablet, Take 1-2 tablets (10-20 mg total) by mouth at bedtime., Disp: 60 tablet, Rfl: 5 .  calcium carbonate  (CALCIUM 600) 600 MG TABS tablet, Take 1 tablet (600 mg total) by mouth 2 (two) times daily with a meal., Disp: 60 tablet, Rfl: 5 .  Cholecalciferol (VITAMIN D3) 125 MCG (5000 UT) CAPS, Take 1 capsule (5,000 Units total) by mouth daily with breakfast. Take along with calcium and magnesium., Disp: 30 capsule, Rfl: 5 .  citalopram (CELEXA) 20 MG tablet, Take 20 mg by mouth daily., Disp: , Rfl:  .  clonazePAM (KLONOPIN) 1 MG tablet, Take 1 mg by mouth daily. , Disp: , Rfl:  .  cyclobenzaprine (FLEXERIL) 10 MG tablet, Take 1 tablet (10 mg total) by mouth 3 (three) times daily., Disp: 90 tablet, Rfl: 5 .  esomeprazole (NEXIUM) 40 MG capsule, Take 1 capsule (40 mg total) by mouth at bedtime. Take while on ibuprofen., Disp: 30 capsule, Rfl: 5 .  latanoprost (XALATAN) 0.005 % ophthalmic solution, Place 1 drop into both eyes daily., Disp: , Rfl:  .  Magnesium Oxide 500 MG CAPS, Take 1 capsule (500 mg total) by mouth at bedtime., Disp: 30 capsule, Rfl: 11 .  Multiple Vitamin (MULTI-VITAMIN) tablet, Take by mouth., Disp: , Rfl:  .  mupirocin ointment (BACTROBAN) 2 %, Apply 1 application topically as needed., Disp: , Rfl:  .  oxyCODONE (OXY IR/ROXICODONE) 5 MG immediate release tablet, Take 1 tablet (  5 mg total) by mouth every 8 (eight) hours as needed for severe pain. Must last 30 days, Disp: 90 tablet, Rfl: 0 .  [START ON 04/15/2020] oxyCODONE (OXY IR/ROXICODONE) 5 MG immediate release tablet, Take 1 tablet (5 mg total) by mouth every 8 (eight) hours as needed for severe pain. Must last 30 days, Disp: 90 tablet, Rfl: 0 .  [START ON 05/15/2020] oxyCODONE (OXY IR/ROXICODONE) 5 MG immediate release tablet, Take 1 tablet (5 mg total) by mouth every 8 (eight) hours as needed for severe pain. Must last 30 days, Disp: 90 tablet, Rfl: 0 .  [START ON 04/20/2020] pregabalin (LYRICA) 150 MG capsule, Take 1 capsule (150 mg total) by mouth 3 (three) times daily., Disp: 90 capsule, Rfl: 5 .  rosuvastatin (CRESTOR) 10 MG tablet,  Take 10 mg by mouth. , Disp: , Rfl:   Past Medical Problems: Past Medical History:  Diagnosis Date  . Acute postoperative pain 05/03/2018  . Anxiety   . Chronic chest pain   . Depression   . DJD (degenerative joint disease)   . Fibromyalgia   . GERD (gastroesophageal reflux disease)   . Hypercholesterolemia     Past Surgical History: Past Surgical History:  Procedure Laterality Date  . BACK SURGERY  2008  . CESAREAN SECTION    . CHOLECYSTECTOMY    . JOINT REPLACEMENT Right 04/16/2019   knee    Social History: Social History   Socioeconomic History  . Marital status: Married    Spouse name: Not on file  . Number of children: Not on file  . Years of education: Not on file  . 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 Smoking status: Former Smoker    Packs/day: 0.50    Years: 3.00    Pack years: 1.50    Types: Pipe, Cigarettes    Quit date: 11/08/2015    Years since quitting: 4.4  . Smokeless tobacco: Current User  Substance and Sexual Activity  . Alcohol use: No  . Drug use: No  . Sexual activity: Not on file  Other Topics Concern  . Not on file  Social History Narrative  . Not on file   Social Determinants of Health   Financial Resource Strain:   . Difficulty of Paying Living Expenses:   Food Insecurity:   . Worried About Programme researcher, broadcasting/film/video in the Last Year:   . Barista in the Last Year:   Transportation Needs:   . Freight forwarder (Medical):   [name] Lack of Transportation (Non-Medical):   Physical Activity:   . Days of Exercise per Week:   . Minutes of Exercise per Session:   Stress:   . Feeling of Stress :   Social Connections:   . Frequency of Communication with Friends and Family:   . Frequency of Social Gatherings with Friends and Family:   . Attends Religious Services:   . Active Member of Clubs or Organizations:   . Attends Occupational hygienist Meetings:   [name] Marital Status:   Intimate Partner Violence:   . Fear of Current or Ex-Partner:   . Emotionally Abused:   [name] Physically Abused:   . Sexually Abused:     Family History: Family History  Problem Relation Age of Onset  . Hypertension Mother   . Arthritis Father     Review of Systems:  Review of Systems  Constitutional: Negative.   Respiratory: Negative.   Cardiovascular: Negative.   Musculoskeletal: Positive for back pain and neck pain.    Physical Exam: Vital Signs BP 119/73 (BP Location: Left Arm, Patient Position: Sitting, Cuff Size: Normal)   Pulse 86   Temp (!) 97.3 F (36.3 C) (Temporal)   Ht 5\' 3"  (1.6 m)   Wt 202 lb (91.6 kg)   SpO2 95%   BMI 35.78 kg/m  Physical Exam Exam conducted with a chaperone present.  Constitutional:      General: She is not in acute distress.    Appearance: Normal appearance. She is not ill-appearing.  HENT:     Head: Normocephalic and atraumatic.  Eyes:     Pupils: Pupils are equal, round Neck:     Musculoskeletal: Normal range of motion.  Cardiovascular:     Rate and Rhythm: Normal rate and regular rhythm.     Pulses: Normal pulses.     Heart sounds: Normal heart sounds. No murmur.  Pulmonary:     Effort: Pulmonary effort is normal. No respiratory distress.     Breath sounds: Normal breath sounds. No wheezing.  Abdominal:     General: Abdomen is flat. There is no distension.     Palpations: Abdomen is soft.     Tenderness: There is no abdominal tenderness.  Musculoskeletal: Normal range of motion.  Tenderness to palpation of bilateral upper trapezius muscles. Skin:    General: Skin is warm and dry.     Findings: No erythema or rash.  Neurological:     General: No focal deficit present.     Mental Status: She is alert and oriented to person, place, and time. Mental status is at baseline.     Motor: No weakness.  Psychiatric:        Mood and Affect: Mood normal.        Behavior: Behavior  normal.     Assessment/Plan: Macromastia  Back pain of thoracolumbar region  Postural kyphosis, thoracic region  Other tobacco product nicotine dependence with nicotine-induced disorder   Mrs. Couzens is scheduled for bilateral mammary reduction with free nipple graft with Dr. Claudia Desanctis.  Risks, benefits, and alternatives of procedure discussed, questions answered and consent obtained.    Smoking Status: Patient quit smoking cigarettes approximately 3 years ago, she notes that she is currently vaping with 1% nicotine liquid.  Counseling provided, due to smoking will need to cancel surgery for at least 6 weeks.  Will order nicotine test to be completed 2 weeks before surgery.  Patient is aware that if she continues to smoke and nicotine test is positive we will need to cancel surgery.  Last Mammogram: Few months ago; Results: negative  Caprini Score: 7, high; Risk Factors include: Family history of DVT, age, BMI greater than 25, and length of planned surgery. Recommendation for mechanical and pharmacological prophylaxis during surgery. Encourage early ambulation.   Pictures obtained: 03/25/20  Post-op Rx sent to pharmacy: No prescription sent to pharmacy as surgery will be postponed for at least 6 weeks.  Patient was provided with the breast reduction and General Surgical Risk consent document and Pain Medication Agreement prior to their appointment.  They had adequate time to read through the risk consent documents and Pain Medication Agreement. We also discussed them in person together during this preop appointment. All of their questions were answered to their satisfaction.  Recommended calling if they have any further questions.  Risk consent form and Pain Medication  Agreement to be scanned into patient's chart.  The risk that can be encountered with breast reduction were discussed and include the following but not limited to these:  Breast asymmetry, fluid accumulation, firmness of the  breast, inability to breast feed, loss of nipple or areola, skin loss, decrease or no nipple sensation, fat necrosis of the breast tissue, bleeding, infection, healing delay.  There are risks of anesthesia, changes to skin sensation and injury to nerves or blood vessels.  The muscle can be temporarily or permanently injured.  You may have an allergic reaction to tape, suture, glue, blood products which can result in skin discoloration, swelling, pain, skin lesions, poor healing.  Any of these can lead to the need for revisonal surgery or stage procedures.  A reduction has potential to interfere with diagnostic procedures.  Nipple or breast piercing can increase risks of infection.  This procedure is best done when the breast is fully developed.  Changes in the breast will continue to occur over time.  Pregnancy can alter the outcomes of previous breast reduction surgery, weight gain and weigh loss can also effect the long term appearance.    Electronically signed by: Kermit Balo Laya Letendre, PA-C 04/07/2020 3:52 PM

## 2020-04-07 NOTE — Addendum Note (Signed)
Addended byKeenan Bachelor on: 04/07/2020 03:53 PM   Modules accepted: Orders

## 2020-04-10 ENCOUNTER — Other Ambulatory Visit: Payer: Self-pay | Admitting: Pain Medicine

## 2020-04-10 DIAGNOSIS — E559 Vitamin D deficiency, unspecified: Secondary | ICD-10-CM

## 2020-04-15 ENCOUNTER — Encounter: Payer: Self-pay | Admitting: Dietician

## 2020-04-15 ENCOUNTER — Ambulatory Visit: Payer: PPO | Admitting: Dietician

## 2020-04-15 NOTE — Progress Notes (Signed)
Called pt and left message to reschedule missed appointment today.

## 2020-04-16 ENCOUNTER — Telehealth: Payer: Self-pay

## 2020-04-16 ENCOUNTER — Encounter: Payer: PPO | Attending: Psychology | Admitting: Psychology

## 2020-04-16 ENCOUNTER — Encounter: Payer: PPO | Admitting: Plastic Surgery

## 2020-04-16 NOTE — Telephone Encounter (Signed)
Called patient - Left voice mail.  Ok to get her cervical spine injections.

## 2020-04-16 NOTE — Telephone Encounter (Signed)
Received voicemail from patient asking if she is able to have cervical spine injections with her pain management provider before her next visit with [place] on 05/13/2020.

## 2020-04-23 ENCOUNTER — Encounter: Payer: PPO | Admitting: Plastic Surgery

## 2020-04-29 ENCOUNTER — Other Ambulatory Visit: Payer: Self-pay | Admitting: Pain Medicine

## 2020-04-29 DIAGNOSIS — G894 Chronic pain syndrome: Secondary | ICD-10-CM

## 2020-05-06 NOTE — Progress Notes (Signed)
PROVIDER NOTE: Information contained herein reflects review and annotations entered in association with encounter. Interpretation of such information and data should be left to medically-trained personnel. Information provided to patient can be located elsewhere in the medical record under "Patient Instructions". Document created using STT-dictation technology, any transcriptional errors that may result from process are unintentional.    Patient: Jennifer Harmon  Service Category: Procedure  Provider: Oswaldo Done, MD  DOB: [birth year]  DOS: 05/07/2020  Location: [facility]  MRN: [id number]  Setting: Ambulatory - outpatient  Referring Provider: Ignatius Specking, MD  Type: Established Patient  Specialty: Interventional Pain Management  PCP: Ignatius Specking, MD   Primary Reason for Visit: Interventional Pain Management Treatment. CC: Neck Pain  Procedure:          Anesthesia, Analgesia, Anxiolysis:  Type: Diagnostic, Inter-Laminar, Cervical Epidural Steroid Injection  #1  Region: Posterior Cervico-thoracic Region Level: C7-T1 Laterality: Right-Sided Paramedial  Type: Moderate (Conscious) Sedation combined with Local Anesthesia Indication(s): Analgesia and Anxiety Route: Intravenous (IV) IV Access: Secured Sedation: Meaningful verbal contact was maintained at all times during the procedure  Local Anesthetic: Lidocaine 1-2%  Position: Prone with head of the table was raised to facilitate breathing.   Indications: 1. DDD (degenerative disc disease), cervical   2. Cervical spondylosis with radiculopathy (Right)   3. Chronic neck pain (Tertiary Area of Pain) (Bilateral) (R>L)   4. Chronic upper back pain (Primary Area of Pain) (Bilateral) (R>L)   5. Abnormal MRI, cervical spine    Pain Score: Pre-procedure: 8 /10 Post-procedure: 0-No pain/10   Pre-op Assessment:  Jennifer Harmon is a 59 y.o. (year old), female patient, seen today for interventional treatment. She  has a past  surgical history that includes Cholecystectomy; Cesarean section; Back surgery (2008); and Joint replacement (Right, 04/16/2019). Jennifer Harmon has a current medication list which includes the following prescription(s): baclofen, calcium carbonate, vitamin d3, citalopram, clonazepam, cyclobenzaprine, esomeprazole, latanoprost, magnesium oxide, multi-vitamin, mupirocin ointment, oxycodone, oxycodone, [START ON 05/15/2020] oxycodone, pregabalin, and rosuvastatin, and the following Facility-Administered Medications: fentanyl and midazolam. Her primarily concern today is the Neck Pain  Initial Vital Signs:  Pulse/HCG Rate: 85ECG Heart Rate: 80 Temp: (!) 97.4 F (36.3 C) Resp: 16 BP: 119/75 SpO2: 100 %  BMI: Estimated body mass index is 36.14 kg/m as calculated from the following:   Height as of this encounter: 5\' 3"  (1.6 m).   Weight as of this encounter: 204 lb (92.5 kg).  Risk Assessment: Allergies: Reviewed. She is allergic to aspirin, bupropion, ibuprofen, and tape.  Allergy Precautions: None required Coagulopathies: Reviewed. None identified.  Blood-thinner therapy: None at this time Active Infection(s): Reviewed. None identified. Jennifer Harmon is afebrile  Site Confirmation: Jennifer Harmon was asked to confirm the procedure and laterality before marking the site Procedure checklist: Completed Consent: Before the procedure and under the influence of no sedative(s), amnesic(s), or anxiolytics, the patient was informed of the treatment options, risks and possible complications. To fulfill our ethical and legal obligations, as recommended by the American Medical Association's Code of Ethics, I have informed the patient of my clinical impression; the nature and purpose of the treatment or procedure; the risks, benefits, and possible complications of the intervention; the alternatives, including doing nothing; the risk(s) and benefit(s) of the alternative treatment(s) or procedure(s); and the risk(s) and  benefit(s) of doing nothing. The patient was provided information about the general risks and possible complications associated with the procedure. These may include, but are not limited  to: failure to achieve desired goals, infection, bleeding, organ or nerve damage, allergic reactions, paralysis, and death. In addition, the patient was informed of those risks and complications associated to Spine-related procedures, such as failure to decrease pain; infection (i.e.: Meningitis, epidural or intraspinal abscess); bleeding (i.e.: epidural hematoma, subarachnoid hemorrhage, or any other type of intraspinal or peri-dural bleeding); organ or nerve damage (i.e.: Any type of peripheral nerve, nerve root, or spinal cord injury) with subsequent damage to sensory, motor, and/or autonomic systems, resulting in permanent pain, numbness, and/or weakness of one or several areas of the body; allergic reactions; (i.e.: anaphylactic reaction); and/or death. Furthermore, the patient was informed of those risks and complications associated with the medications. These include, but are not limited to: allergic reactions (i.e.: anaphylactic or anaphylactoid reaction(s)); adrenal axis suppression; blood sugar elevation that in diabetics may result in ketoacidosis or comma; water retention that in patients with history of congestive heart failure may result in shortness of breath, pulmonary edema, and decompensation with resultant heart failure; weight gain; swelling or edema; medication-induced neural toxicity; particulate matter embolism and blood vessel occlusion with resultant organ, and/or nervous system infarction; and/or aseptic necrosis of one or more joints. Finally, the patient was informed that Medicine is not an exact science; therefore, there is also the possibility of unforeseen or unpredictable risks and/or possible complications that may result in a catastrophic outcome. The patient indicated having understood very  clearly. We have given the patient no guarantees and we have made no promises. Enough time was given to the patient to ask questions, all of which were answered to the patient's satisfaction. Jennifer Harmon has indicated that she wanted to continue with the procedure. Attestation: I, the ordering provider, attest that I have discussed with the patient the benefits, risks, side-effects, alternatives, likelihood of achieving goals, and potential problems during recovery for the procedure that I have provided informed consent. Date   Time: 05/07/2020  8:04 AM  Pre-Procedure Preparation:  Monitoring: As per clinic protocol. Respiration, ETCO2, SpO2, BP, heart rate and rhythm monitor placed and checked for adequate function Safety Precautions: Patient was assessed for positional comfort and pressure points before starting the procedure. Time-out: I initiated and conducted the "Time-out" before starting the procedure, as per protocol. The patient was asked to participate by confirming the accuracy of the "Time Out" information. Verification of the correct person, site, and procedure were performed and confirmed by me, the nursing staff, and the patient. "Time-out" conducted as per Joint Commission's Universal Protocol (UP.01.01.01). Time: 0845  Description of Procedure:          Target Area: For Epidural Steroid injections the target is the interlaminar space, initially targeting the lower border of the superior vertebral body lamina. Approach: Paramedial approach. Area Prepped: Entire PosteriorCervical Region DuraPrep (Iodine Povacrylex [0.7% available iodine] and Isopropyl Alcohol, 74% w/w) Safety Precautions: Aspiration looking for blood return was conducted prior to all injections. At no point did we inject any substances, as a needle was being advanced. No attempts were made at seeking any paresthesias. Safe injection practices and needle disposal techniques used. Medications properly checked for expiration  dates. SDV (single dose vial) medications used. Description of the Procedure: Protocol guidelines were followed. The procedure needle was introduced through the skin, ipsilateral to the reported pain, and advanced to the target area. Bone was contacted and the needle walked caudad, until the lamina was cleared. The epidural space was identified using loss-of-resistance technique with 2-3 ml of PF-NaCl (0.9% NSS), in  a 5cc LOR glass syringe. Vitals:   05/07/20 0852 05/07/20 0902 05/07/20 0912 05/07/20 0922  BP: 124/89 129/76 118/82 120/84  Pulse:      Resp: Temp:  (!) 97.2 F (36.2 C)  (!) 97.4 F (36.3 C)  TempSrc:      SpO2: 94% 97% 99% 99%  Weight:      Height:        Start Time: 0845 hrs. End Time: 0850 hrs. Materials:  Needle(s) Type: Epidural needle Gauge: 17G Length: 3.5-in Medication(s): Please see orders for medications and dosing details.  Imaging Guidance (Spinal):          Type of Imaging Technique: Fluoroscopy Guidance (Spinal) Indication(s): Assistance in needle guidance and placement for procedures requiring needle placement in or near specific anatomical locations not easily accessible without such assistance. Exposure Time: Please see nurses notes. Contrast: Before injecting any contrast, we confirmed that the patient did not have an allergy to iodine, shellfish, or radiological contrast. Once satisfactory needle placement was completed at the desired level, radiological contrast was injected. Contrast injected under live fluoroscopy. No contrast complications. See chart for type and volume of contrast used. Fluoroscopic Guidance: I was personally present during the use of fluoroscopy. "Tunnel Vision Technique" used to obtain the best possible view of the target area. Parallax error corrected before commencing the procedure. "Direction-depth-direction" technique used to introduce the needle under continuous pulsed fluoroscopy. Once target was reached,  antero-posterior, oblique, and lateral fluoroscopic projection used confirm needle placement in all planes. Images permanently stored in EMR. Interpretation: I personally interpreted the imaging intraoperatively. Adequate needle placement confirmed in multiple planes. Appropriate spread of contrast into desired area was observed. No evidence of afferent or efferent intravascular uptake. No intrathecal or subarachnoid spread observed. Permanent images saved into the patient's record.  Antibiotic Prophylaxis:   Anti-infectives (From admission, onward)   None     Indication(s): None identified  Post-operative Assessment:  Post-procedure Vital Signs:  Pulse/HCG Rate: 8585 Temp: (!) 97.4 F (36.3 C) Resp: 18 BP: 120/84 SpO2: 99 %  EBL: None  Complications: No immediate post-treatment complications observed by team, or reported by patient.  Note: The patient tolerated the entire procedure well. A repeat set of vitals were taken after the procedure and the patient was kept under observation following institutional policy, for this type of procedure. Post-procedural neurological assessment was performed, showing return to baseline, prior to discharge. The patient was provided with post-procedure discharge instructions, including a section on how to identify potential problems. Should any problems arise concerning this procedure, the patient was given instructions to immediately contact us, at any time, without hesitation. In any case, we plan to contact the patient by telephone for a follow-up status report regarding this interventional procedure.  Comments:  No additional relevant information.  Plan of Care  Orders:  Orders Placed This Encounter  Procedures   Cervical Epidural Injection    Procedure: Cervical Epidural Steroid Injection/Block Purpose: Diagnostic Indication(s): Radiculitis and cervicalgia associater with cervical degenerative disc disease.    Scheduling Instructions:      Level(s): C7-T1     Laterality: Right-sided     Sedation: Patient's choice.     Timeframe: Today    Order Specific Question:   Where will this procedure be performed?    Answer:   [facility]    Comments:   by Dr. Conception [facility]ms [facility] C-[facility] 1-60 MIN NO REPORT    Intraoperative interpretation  by procedural physician at [facility].    Standing Status:   Standing    Number of Occurrences:   1    Order Specific Question:   Reason for exam:    Answer:   Assistance in needle guidance and placement for procedures requiring needle placement in or near specific anatomical locations not easily accessible without such assistance.   Informed Consent Details: Physician/Practitioner Attestation; Transcribe to consent form and obtain patient signature    Nursing Order: Transcribe to consent form and obtain patient signature. Note: Always confirm laterality of pain with Ms. Debose, before procedure.  Procedure: Cervical Epidural Steroid Injection (CESI) under fluoroscopic guidance  Indication/Reason: Cervicalgia (Neck Pain) with or without Cervical Radiculopathy/Radiculitis (Arm/Shoulder Pain, Numbness, and/or weakness), secondary to Cervical and/or Cervicothoracic Degenerative Disc Disease (DDD), with or without Intervertebral Disc Displacement (IVDD).  Provider Attestation: I, Leslieanne Cobarrubias A. Laban Emperor, MD, (Pain Management Specialist), the physician/practitioner, attest that I have discussed with the patient the benefits, risks, side effects, alternatives, likelihood of achieving goals and potential problems during recovery for the procedure that I have provided informed consent.   Provide equipment / supplies at bedside    Equipment required: Single use, disposable, "Epidural Tray" Epidural Catheter: NOT required    Standing Status:   Standing    Number of Occurrences:   1    Order Specific Question:   Specify    Answer:   Epidural Tray   Chronic Opioid Analgesic:  Oxycodone  IR 5 mg, 1 tab PO q 8 hrs (15 mg/day of oxycodone) MME/day: 22.5 mg/day.   Medications ordered for procedure: Meds ordered this encounter  Medications   iohexol (OMNIPAQUE) 180 MG/ML injection 10 mL    Must be Myelogram-compatible. If not available, you may substitute with a water-soluble, non-ionic, hypoallergenic, myelogram-compatible radiological contrast medium.   lidocaine (XYLOCAINE) 2 % (with pres) injection 400 mg   lactated ringers infusion 1,000 mL   midazolam (VERSED) 5 MG/5ML injection 1-2 mg    Make sure Flumazenil is available in the pyxis when using this medication. If oversedation occurs, administer 0.2 mg IV over 15 sec. If after 45 sec no response, administer 0.2 mg again over 1 min; may repeat at 1 min intervals; not to exceed 4 doses (1 mg)   fentaNYL (SUBLIMAZE) injection 25-50 mcg    Make sure Narcan is available in the pyxis when using this medication. In the event of respiratory depression (RR< 8/min): Titrate NARCAN (naloxone) in increments of 0.1 to 0.2 mg IV at 2-3 minute intervals, until desired degree of reversal.   sodium chloride flush (NS) 0.9 % injection 1 mL   ropivacaine (PF) 2 mg/mL (0.2%) (NAROPIN) injection 1 mL   dexamethasone (DECADRON) injection 10 mg   Medications administered: We administered iohexol, lidocaine, lactated ringers, midazolam, fentaNYL, sodium chloride flush, ropivacaine (PF) 2 mg/mL (0.2%), and dexamethasone.  See the medical record for exact dosing, route, and time of administration.  Follow-up plan:   Return in about 2 weeks (around 05/21/2020) for (VV), (PP).       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  Unable to enter space despite multiple attempts on 03/19/2020.   Considering:   Diagnostic Trigger point injections Diagnostic midline caudal ESI  Possible Racz procedure (epidural Neurolysis) Diagnostic  thoracic facet block Possible thoracic facet RFA Diagnostic right-sided CESI #1  Diagnosticbilateral cervical facet block Possiblebilateral cervical facet RFA Diagnostic right IA shoulder  injection Diagnostic right suprascapular NB Possibleright suprascapular RFA Diagnostic left genicular NB Possible bilateral genicular RFA   Palliative PRN treatment(s):   Palliative right T6-7 TESI #2  Palliative bilateral lumbar facet block #4  Palliative right SI block #3  Palliative right IA hip joint injections  Palliative right lumbar facet RFA #2 (last done 07/25/2019)  Palliative right SI joint RFA #2 (last done 05/03/2018)  Palliative left  lumbar facet RFA #2 (last done 09/10/2019)  Palliative right genicular NB #2      Recent Visits Date Type Provider Dept  04/01/20 Telemedicine Delano Metz, MD [facility]  03/19/20 Procedure visit Delano Metz, MD [facility]  03/02/20 Telemedicine Delano Metz, MD [facility]  Showing recent visits within past 90 days and meeting all other requirements Today's Visits Date Type Provider Dept  05/07/20 Procedure visit Delano Metz, MD [facility]  Showing today's visits and meeting all other requirements Future Appointments Date Type Provider Dept  05/18/20 Appointment Delano Metz, MD [facility]  05/25/20 Appointment Delano Metz, MD [facility]  Showing future appointments within next 90 days and meeting all other requirements  Disposition: Discharge home  Discharge (Date   Time): 05/07/2020; 0925 hrs.   Primary Care Physician: Ignatius Specking, MD Location: [facility] Note by: Oswaldo Done, MD Date: 05/07/2020; Time: 10:18 AM  Disclaimer:  Medicine is not an Visual merchandiser. The only guarantee in medicine is that nothing is guaranteed. It is important to note that the decision to proceed with this  intervention was based on the information collected from the patient. The Data and conclusions were drawn from the patient's questionnaire, the interview, and the physical examination. Because the information was provided in large part by the patient, it cannot be guaranteed that it has not been purposely or unconsciously manipulated. Every effort has been made to obtain as much relevant data as possible for this evaluation. It is important to note that the conclusions that lead to this procedure are derived in large part from the available data. Always take into account that the treatment will also be dependent on availability of resources and existing treatment guidelines, considered by other Pain Management Practitioners as being common knowledge and practice, at the time of the intervention. For Medico-Legal purposes, it is also important to point out that variation in procedural techniques and pharmacological choices are the acceptable norm. The indications, contraindications, technique, and results of the above procedure should only be interpreted and judged by a Board-Certified Interventional Pain Specialist with extensive familiarity and expertise in the same exact procedure and technique.

## 2020-05-07 ENCOUNTER — Other Ambulatory Visit: Payer: Self-pay

## 2020-05-07 ENCOUNTER — Ambulatory Visit (HOSPITAL_BASED_OUTPATIENT_CLINIC_OR_DEPARTMENT_OTHER): Payer: PPO | Admitting: Pain Medicine

## 2020-05-07 ENCOUNTER — Encounter: Payer: Self-pay | Admitting: Pain Medicine

## 2020-05-07 ENCOUNTER — Ambulatory Visit
Admission: RE | Admit: 2020-05-07 | Discharge: 2020-05-07 | Disposition: A | Discharge status: Home / Self Care | Payer: PPO | Source: Ambulatory Visit | Attending: Pain Medicine | Admitting: Pain Medicine

## 2020-05-07 VITALS — BP 120/84 | HR 85 | Temp 97.4°F | Resp 18 | Ht 63.0 in | Wt 204.0 lb

## 2020-05-07 DIAGNOSIS — M5030 Other cervical disc degeneration, unspecified cervical region: Secondary | ICD-10-CM | POA: Insufficient documentation

## 2020-05-07 DIAGNOSIS — R937 Abnormal findings on diagnostic imaging of other parts of musculoskeletal system: Secondary | ICD-10-CM | POA: Diagnosis not present

## 2020-05-07 DIAGNOSIS — M542 Cervicalgia: Secondary | ICD-10-CM | POA: Diagnosis not present

## 2020-05-07 DIAGNOSIS — M4722 Other spondylosis with radiculopathy, cervical region: Secondary | ICD-10-CM | POA: Insufficient documentation

## 2020-05-07 DIAGNOSIS — G8929 Other chronic pain: Secondary | ICD-10-CM | POA: Insufficient documentation

## 2020-05-07 DIAGNOSIS — M549 Dorsalgia, unspecified: Secondary | ICD-10-CM | POA: Insufficient documentation

## 2020-05-07 MED ORDER — IOHEXOL 180 MG/ML  SOLN
10.0000 mL | Freq: Once | INTRAMUSCULAR | Status: AC
  Administered 2020-05-07: 10 mL via EPIDURAL
  Filled 2020-05-07: qty 20

## 2020-05-07 MED ORDER — LACTATED RINGERS IV SOLN
1000.0000 mL | Freq: Once | INTRAVENOUS | Status: AC
  Administered 2020-05-07: 1000 mL via INTRAVENOUS

## 2020-05-07 MED ORDER — LIDOCAINE HCL 2 % IJ SOLN
20.0000 mL | Freq: Once | INTRAMUSCULAR | Status: AC
  Administered 2020-05-07: 400 mg
  Filled 2020-05-07: qty 20

## 2020-05-07 MED ORDER — ROPIVACAINE HCL 2 MG/ML IJ SOLN
1.0000 mL | Freq: Once | INTRAMUSCULAR | Status: AC
  Administered 2020-05-07: 1 mL via EPIDURAL
  Filled 2020-05-07: qty 10

## 2020-05-07 MED ORDER — SODIUM CHLORIDE (PF) 0.9 % IJ SOLN
INTRAMUSCULAR | Status: AC
  Filled 2020-05-07: qty 10

## 2020-05-07 MED ORDER — DEXAMETHASONE SODIUM PHOSPHATE 10 MG/ML IJ SOLN
10.0000 mg | Freq: Once | INTRAMUSCULAR | Status: AC
  Administered 2020-05-07: 10 mg
  Filled 2020-05-07: qty 1

## 2020-05-07 MED ORDER — FENTANYL CITRATE (PF) 100 MCG/2ML IJ SOLN
25.0000 ug | INTRAMUSCULAR | Status: DC | PRN
  Administered 2020-05-07: 50 ug via INTRAVENOUS
  Filled 2020-05-07: qty 2

## 2020-05-07 MED ORDER — SODIUM CHLORIDE 0.9% FLUSH
1.0000 mL | Freq: Once | INTRAVENOUS | Status: AC
  Administered 2020-05-07: 1 mL

## 2020-05-07 MED ORDER — MIDAZOLAM HCL 5 MG/5ML IJ SOLN
1.0000 mg | INTRAMUSCULAR | Status: DC | PRN
  Administered 2020-05-07: 2 mg via INTRAVENOUS
  Filled 2020-05-07: qty 5

## 2020-05-07 NOTE — Patient Instructions (Signed)

## 2020-05-07 NOTE — Progress Notes (Signed)
Safety precautions to be maintained throughout the outpatient stay will include: orient to surroundings, keep bed in low position, maintain call bell within reach at all times, provide assistance with transfer out of bed and ambulation.  

## 2020-05-08 ENCOUNTER — Telehealth: Payer: Self-pay

## 2020-05-08 DIAGNOSIS — F419 Anxiety disorder, unspecified: Secondary | ICD-10-CM | POA: Diagnosis not present

## 2020-05-08 DIAGNOSIS — M5136 Other intervertebral disc degeneration, lumbar region: Secondary | ICD-10-CM | POA: Diagnosis not present

## 2020-05-08 DIAGNOSIS — F322 Major depressive disorder, single episode, severe without psychotic features: Secondary | ICD-10-CM | POA: Diagnosis not present

## 2020-05-08 DIAGNOSIS — Z299 Encounter for prophylactic measures, unspecified: Secondary | ICD-10-CM | POA: Diagnosis not present

## 2020-05-08 NOTE — Telephone Encounter (Signed)
Post procedure phone call.  LM 

## 2020-05-12 DIAGNOSIS — I10 Essential (primary) hypertension: Secondary | ICD-10-CM | POA: Diagnosis not present

## 2020-05-12 DIAGNOSIS — K219 Gastro-esophageal reflux disease without esophagitis: Secondary | ICD-10-CM | POA: Diagnosis not present

## 2020-05-12 DIAGNOSIS — Z6835 Body mass index (BMI) 35.0-35.9, adult: Secondary | ICD-10-CM | POA: Diagnosis not present

## 2020-05-12 DIAGNOSIS — Z299 Encounter for prophylactic measures, unspecified: Secondary | ICD-10-CM | POA: Diagnosis not present

## 2020-05-12 DIAGNOSIS — F322 Major depressive disorder, single episode, severe without psychotic features: Secondary | ICD-10-CM | POA: Diagnosis not present

## 2020-05-13 ENCOUNTER — Ambulatory Visit (INDEPENDENT_AMBULATORY_CARE_PROVIDER_SITE_OTHER): Payer: PPO | Admitting: Surgical

## 2020-05-13 ENCOUNTER — Encounter: Payer: Self-pay | Admitting: Surgical

## 2020-05-13 ENCOUNTER — Other Ambulatory Visit: Payer: Self-pay

## 2020-05-13 VITALS — BP 122/76 | HR 93 | Temp 98.0°F

## 2020-05-13 DIAGNOSIS — M545 Low back pain, unspecified: Secondary | ICD-10-CM

## 2020-05-13 DIAGNOSIS — F17299 Nicotine dependence, other tobacco product, with unspecified nicotine-induced disorders: Secondary | ICD-10-CM

## 2020-05-13 DIAGNOSIS — M546 Pain in thoracic spine: Secondary | ICD-10-CM

## 2020-05-13 DIAGNOSIS — N62 Hypertrophy of breast: Secondary | ICD-10-CM

## 2020-05-13 DIAGNOSIS — M4004 Postural kyphosis, thoracic region: Secondary | ICD-10-CM

## 2020-05-13 MED ORDER — HYDROCODONE-ACETAMINOPHEN 5-325 MG PO TABS: 1.0000 | ORAL_TABLET | Freq: Four times a day (QID) | ORAL | 0 refills | Status: AC | PRN

## 2020-05-13 MED ORDER — ONDANSETRON HCL 4 MG PO TABS: 4.0000 mg | ORAL_TABLET | Freq: Three times a day (TID) | ORAL | 0 refills | Status: DC | PRN

## 2020-05-13 NOTE — H&P (View-Only) (Signed)
Subjective:     Patient ID: Jennifer Harmon, female    DOB: [birth year], 60 y.o.   MRN: [id number]  Chief Complaint  Patient presents with  . Follow-up    HPI: The patient is a 59 y.o. female here for follow-up to discuss her upcoming surgery, bilateral breast reduction on 05/25/2020 with Dr. Steffanie Dunn pace.  Patient's initial surgery was canceled due to patient smoking vape with nicotine.  Patient reports that she has not been smoking since her last appointment with me on 04/07/2020.  She reports that she is very glad that she has stopped smoking.  She reports that she also saw her pain management provider Dr. Laban Emperor recently and provided me today with a postop pain management form from his office.   Patient reports she would like to be a D cup.  Patient's initial preop appointment was on 04/07/2020.  She reports no changes since that appointment.  Plan is to currently remove 5100 g of breast tissue from each side.  No history of DVT/PE.  Reports her father had a DVT, no history of PE.[name]  No family or personal history of bleeding or clotting disorders.  Patient is not currently taking any blood thinners.  No history of CVA/MI.    Review of Systems  Constitutional: Negative.   Respiratory: Negative.   Cardiovascular: Negative.   Skin: Negative.      Objective:   Vital Signs BP 122/76 (BP Location: Left Arm, Patient Position: Sitting, Cuff Size: Normal)   Pulse 93   Temp 98 F (36.7 C) (Temporal)   SpO2 96%  Vital Signs and Nursing Note Reviewed Chaperone present Physical Exam Exam conducted with a chaperone present.  Constitutional:      General: She is not in acute distress.    Appearance: Normal appearance. She is not ill-appearing.  HENT:     Head: Normocephalic and atraumatic.  Eyes:     Pupils: Pupils are equal, round Neck:     Musculoskeletal: Normal range of motion.  Cardiovascular:     Rate and Rhythm: Normal rate and regular rhythm.     Pulses: Normal pulses.      Heart sounds: Normal heart sounds. No murmur.  Pulmonary:     Effort: Pulmonary effort is normal. No respiratory distress.     Breath sounds: Normal breath sounds. No wheezing.  Abdominal:     General: Abdomen is flat. There is no distension.     Palpations: Abdomen is soft.     Tenderness: There is no abdominal tenderness.   Skin:    General: Skin is warm and dry.  Neurological:     General: No focal deficit present.     Mental Status: She is alert and oriented to person, place, and time. Mental status is at baseline.  Psychiatric:        Mood and Affect: Mood normal.        Behavior: Behavior normal.      Assessment/Plan:     ICD-10-CM   1. Macromastia  N62   2. Back pain of thoracolumbar region  M54.5    M54.6   3. Postural kyphosis, thoracic region  M40.04   4. Other tobacco product nicotine dependence with nicotine-induced disorder  F17.299     Patient is scheduled for bilateral breast reduction with free nipple graft with Dr. Arita Miss on 05/25/2020.  Consent previously obtained and has been scanned to the chart.  All of patient and her husband's questions were answered today.  Patient  has quit smoking and has now been nicotine free for approximately 1 month.  She is going to have a nicotine test today at [facility].  Patient had a mammogram a few months ago which was negative.  Postop prescription sent to pharmacy: Norco, Zofran.  Was provided with a postop pain management form provided by patient's pain management provider that noted it would be acceptable for us to provide her with postop pain medications.  Patient chronically takes oxycodone, we discussed taking Norco as needed as needed for severe pain associated with the surgical procedure.  Recommend calling with questions or concerns.   Jennifer Harmon J Eesa Justiss, PA-C 05/13/2020, 11:47 AM     

## 2020-05-13 NOTE — Progress Notes (Signed)
Subjective:     Patient ID: Jennifer Harmon, female    DOB: [birth year], 60 y.o.   MRN: [id number]  Chief Complaint  Patient presents with  . Follow-up    HPI: The patient is a 59 y.o. female here for follow-up to discuss her upcoming surgery, bilateral breast reduction on 05/25/2020 with Dr. Steffanie Dunn pace.  Patient's initial surgery was canceled due to patient smoking vape with nicotine.  Patient reports that she has not been smoking since her last appointment with me on 04/07/2020.  She reports that she is very glad that she has stopped smoking.  She reports that she also saw her pain management provider Dr. Laban Emperor recently and provided me today with a postop pain management form from his office.   Patient reports she would like to be a D cup.  Patient's initial preop appointment was on 04/07/2020.  She reports no changes since that appointment.  Plan is to currently remove 5100 g of breast tissue from each side.  No history of DVT/PE.  Reports her father had a DVT, no history of PE.Jennifer Harmon  No family or personal history of bleeding or clotting disorders.  Patient is not currently taking any blood thinners.  No history of CVA/MI.    Review of Systems  Constitutional: Negative.   Respiratory: Negative.   Cardiovascular: Negative.   Skin: Negative.      Objective:   Vital Signs BP 122/76 (BP Location: Left Arm, Patient Position: Sitting, Cuff Size: Normal)   Pulse 93   Temp 98 F (36.7 C) (Temporal)   SpO2 96%  Vital Signs and Nursing Note Reviewed Chaperone present Physical Exam Exam conducted with a chaperone present.  Constitutional:      General: She is not in acute distress.    Appearance: Normal appearance. She is not ill-appearing.  HENT:     Head: Normocephalic and atraumatic.  Eyes:     Pupils: Pupils are equal, round Neck:     Musculoskeletal: Normal range of motion.  Cardiovascular:     Rate and Rhythm: Normal rate and regular rhythm.     Pulses: Normal pulses.      Heart sounds: Normal heart sounds. No murmur.  Pulmonary:     Effort: Pulmonary effort is normal. No respiratory distress.     Breath sounds: Normal breath sounds. No wheezing.  Abdominal:     General: Abdomen is flat. There is no distension.     Palpations: Abdomen is soft.     Tenderness: There is no abdominal tenderness.   Skin:    General: Skin is warm and dry.  Neurological:     General: No focal deficit present.     Mental Status: She is alert and oriented to person, place, and time. Mental status is at baseline.  Psychiatric:        Mood and Affect: Mood normal.        Behavior: Behavior normal.      Assessment/Plan:     ICD-10-CM   1. Macromastia  N62   2. Back pain of thoracolumbar region  M54.5    M54.6   3. Postural kyphosis, thoracic region  M40.04   4. Other tobacco product nicotine dependence with nicotine-induced disorder  F17.299     Patient is scheduled for bilateral breast reduction with free nipple graft with Dr. Arita Miss on 05/25/2020.  Consent previously obtained and has been scanned to the chart.  All of patient and her husband's questions were answered today.  Patient  has quit smoking and has now been nicotine free for approximately 1 month.  She is going to have a nicotine test today at [facility].  Patient had a mammogram a few months ago which was negative.  Postop prescription sent to pharmacy: Norco, Zofran.  Was provided with a postop pain management form provided by patient's pain management provider that noted it would be acceptable for [place] to provide her with postop pain medications.  Patient chronically takes oxycodone, we discussed taking Norco as needed as needed for severe pain associated with the surgical procedure.  Recommend calling with questions or concerns.   Kermit Balo Javell Blackburn, PA-C 05/13/2020, 11:47 AM

## 2020-05-14 DIAGNOSIS — F17299 Nicotine dependence, other tobacco product, with unspecified nicotine-induced disorders: Secondary | ICD-10-CM | POA: Diagnosis not present

## 2020-05-17 LAB — NICOTINE/COTININE METABOLITES
Cotinine: <1 ng/mL
Nicotine: <1 ng/mL

## 2020-05-17 NOTE — Progress Notes (Deleted)
No show

## 2020-05-18 ENCOUNTER — Ambulatory Visit: Payer: PPO | Admitting: Pain Medicine

## 2020-05-20 NOTE — Progress Notes (Signed)
[facility] - [place], [place] - [address] [place] [address] Phone: [phone] Fax: [phone]      Your procedure is scheduled on Monday, July 19th.  Report to [facility] Main Entrance "A" at 5:30 A.M., and check in at the Admitting office.  Call this number if you have problems the morning of surgery:  [phone]  Call [phone] if you have any questions prior to your surgery date Monday-Friday 8am-4pm    Remember:  Do not eat after midnight the night before your surgery  You may drink clear liquids until 4:30 AM the morning of your surgery.   Clear liquids allowed are: Water, Non-Citrus Juices (without pulp), Carbonated Beverages, Clear Tea, Black Coffee Only, and Gatorade    Take these medicines the morning of surgery with A SIP OF WATER   Tylenol - if needed  Citalopram (Celexa)  Clonazepam (Klonopin)  Cyclobenzaprine (Flexeril)  Zofran - if needed  Oxycodone - if needed  Pregabalin (Lyrica)  Rosuvastatin (Crestor)  As of today, STOP taking any Aspirin (unless otherwise instructed by your surgeon) Aleve, Naproxen, Ibuprofen, Motrin, Advil, Goody's, BC's, all herbal medications, fish oil, and all vitamins.                      Do not wear jewelry, make up, or nail polish            Do not wear lotions, powders, perfumes, or deodorant.            Do not shave 48 hours prior to surgery.              Do not bring valuables to the hospital.            [facility] is not responsible for any belongings or valuables.  Do NOT Smoke (Tobacco/Vaping) or drink Alcohol 24 hours prior to your procedure If you use a CPAP at night, you may bring all equipment for your overnight stay.   Contacts, glasses, dentures or bridgework may not be worn into surgery.      For patients admitted to the hospital, discharge time will be determined by your treatment team.   Patients discharged the day of surgery will not be allowed to drive home, and  someone needs to stay with them for 24 hours.    Special instructions:   [place]- Preparing For Surgery  Before surgery, you can play an important role. Because skin is not sterile, your skin needs to be as free of germs as possible. You can reduce the number of germs on your skin by washing with CHG (chlorahexidine gluconate) Soap before surgery.  CHG is an antiseptic cleaner which kills germs and bonds with the skin to continue killing germs even after washing.    Oral Hygiene is also important to reduce your risk of infection.  Remember - BRUSH YOUR TEETH THE MORNING OF SURGERY WITH YOUR REGULAR TOOTHPASTE  Please do not use if you have an allergy to CHG or antibacterial soaps. If your skin becomes reddened/irritated stop using the CHG.  Do not shave (including legs and underarms) for at least 48 hours prior to first CHG shower. It is OK to shave your face.  Please follow these instructions carefully.   1. Shower the NIGHT BEFORE SURGERY and the MORNING OF SURGERY with CHG Soap.   2. If you chose to wash your hair, wash your hair first as usual with your  normal shampoo.  3. After you shampoo, rinse your hair and body thoroughly to remove the shampoo.  4. Use CHG as you would any other liquid soap. You can apply CHG directly to the skin and wash gently with a scrungie or a clean washcloth.   5. Apply the CHG Soap to your body ONLY FROM THE NECK DOWN.  Do not use on open wounds or open sores. Avoid contact with your eyes, ears, mouth and genitals (private parts). Wash Face and genitals (private parts)  with your normal soap.   6. Wash thoroughly, paying special attention to the area where your surgery will be performed.  7. Thoroughly rinse your body with warm water from the neck down.  8. DO NOT shower/wash with your normal soap after using and rinsing off the CHG Soap.  9. Pat yourself dry with a CLEAN TOWEL.  10. Wear CLEAN PAJAMAS to bed the night before  surgery  11. Place CLEAN SHEETS on your bed the night of your first shower and DO NOT SLEEP WITH PETS.   Day of Surgery: Wear Clean/Comfortable clothing the morning of surgery Do not apply any deodorants/lotions.   Remember to brush your teeth WITH YOUR REGULAR TOOTHPASTE.   Please read over the following fact sheets that you were given.

## 2020-05-21 ENCOUNTER — Other Ambulatory Visit (HOSPITAL_COMMUNITY)
Admission: RE | Admit: 2020-05-21 | Discharge: 2020-05-21 | Disposition: A | Discharge status: Home / Self Care | Payer: PPO | Source: Ambulatory Visit | Attending: Plastic Surgery | Admitting: Plastic Surgery

## 2020-05-21 ENCOUNTER — Encounter (HOSPITAL_COMMUNITY)
Admission: RE | Admit: 2020-05-21 | Discharge: 2020-05-21 | Disposition: A | Discharge status: Home / Self Care | Payer: PPO | Source: Ambulatory Visit | Attending: Plastic Surgery | Admitting: Plastic Surgery

## 2020-05-21 ENCOUNTER — Other Ambulatory Visit: Payer: Self-pay

## 2020-05-21 ENCOUNTER — Encounter (HOSPITAL_COMMUNITY): Payer: Self-pay

## 2020-05-21 DIAGNOSIS — Z01812 Encounter for preprocedural laboratory examination: Secondary | ICD-10-CM | POA: Insufficient documentation

## 2020-05-21 DIAGNOSIS — Z20822 Contact with and (suspected) exposure to covid-19: Secondary | ICD-10-CM | POA: Diagnosis not present

## 2020-05-21 LAB — SARS CORONAVIRUS 2 (TAT 6-24 HRS): SARS Coronavirus 2: NEGATIVE

## 2020-05-21 LAB — CBC
HCT: 41.3 % (ref 36.0–46.0)
Hemoglobin: 12.6 g/dL (ref 12.0–15.0)
MCH: 27.5 pg (ref 26.0–34.0)
MCHC: 30.5 g/dL (ref 30.0–36.0)
MCV: 90.2 fL (ref 80.0–100.0)
Platelets: 260 10*3/uL (ref 150–400)
RBC: 4.58 MIL/uL (ref 3.87–5.11)
RDW: 13.9 % (ref 11.5–15.5)
WBC: 9.6 10*3/uL (ref 4.0–10.5)
nRBC: 0 % (ref 0.0–0.2)

## 2020-05-21 NOTE — Progress Notes (Signed)
PCP - Dr. Sherril Croon   Chest x-ray - n/a  EKG - n/a ECHO - 2012   SA - yes, wears CPAP   COVID TEST- 05-21-20   Anesthesia review: n/a  Patient denies shortness of breath, fever, cough and chest pain at PAT appointment   All instructions explained to the patient, with a verbal understanding of the material. Patient agrees to go over the instructions while at home for a better understanding. Patient also instructed to self quarantine after being tested for COVID-19. The opportunity to ask questions was provided.

## 2020-05-24 NOTE — Anesthesia Preprocedure Evaluation (Addendum)
Anesthesia Evaluation  Patient identified by MRN, date of birth, ID band Patient awake    Reviewed: Allergy & Precautions, NPO status , Patient's Chart, lab work & pertinent test results  History of Anesthesia Complications Negative for: history of anesthetic complications  Airway Mallampati: II  TM Distance: >3 FB Neck ROM: Full    Dental  (+) Teeth Intact, Dental Advisory Given, Chipped,    Pulmonary sleep apnea and Continuous Positive Airway Pressure Ventilation , former smoker,    Pulmonary exam normal breath sounds clear to auscultation       Cardiovascular negative cardio ROS Normal cardiovascular exam Rhythm:Regular Rate:Normal     Neuro/Psych PSYCHIATRIC DISORDERS Anxiety Depression negative neurological ROS     GI/Hepatic Neg liver ROS, GERD  Medicated and Controlled,  Endo/Other  Obesity   Renal/GU negative Renal ROS     Musculoskeletal  (+) Arthritis , Fibromyalgia -, narcotic dependent  Abdominal   Peds  Hematology negative hematology ROS (+)   Anesthesia Other Findings Macromastia   Reproductive/Obstetrics                            Anesthesia Physical Anesthesia Plan  ASA: II  Anesthesia Plan: General   Post-op Pain Management:    Induction: Intravenous  PONV Risk Score and Plan: 4 or greater and Midazolam, Scopolamine patch - Pre-op, Dexamethasone, Ondansetron and Propofol infusion  Airway Management Planned: Oral ETT  Additional Equipment:   Intra-op Plan:   Post-operative Plan: Extubation in OR  Informed Consent: I have reviewed the patients History and Physical, chart, labs and discussed the procedure including the risks, benefits and alternatives for the proposed anesthesia with the patient or authorized representative who has indicated his/her understanding and acceptance.     Dental advisory given  Plan Discussed with: CRNA  Anesthesia Plan  Comments:        Anesthesia Quick Evaluation

## 2020-05-25 ENCOUNTER — Ambulatory Visit (HOSPITAL_COMMUNITY): Payer: PPO | Admitting: Anesthesiology

## 2020-05-25 ENCOUNTER — Other Ambulatory Visit: Payer: Self-pay

## 2020-05-25 ENCOUNTER — Ambulatory Visit: Payer: PPO | Admitting: Pain Medicine

## 2020-05-25 ENCOUNTER — Telehealth: Payer: PPO | Admitting: Pain Medicine

## 2020-05-25 ENCOUNTER — Encounter (HOSPITAL_COMMUNITY): Payer: Self-pay | Admitting: Plastic Surgery

## 2020-05-25 ENCOUNTER — Ambulatory Visit (HOSPITAL_COMMUNITY)
Admission: RE | Admit: 2020-05-25 | Discharge: 2020-05-25 | Disposition: A | Discharge status: Home / Self Care | Payer: PPO | Source: Ambulatory Visit | Attending: Plastic Surgery | Admitting: Plastic Surgery

## 2020-05-25 ENCOUNTER — Encounter (HOSPITAL_COMMUNITY)
Admission: RE | Disposition: A | Discharge status: Home / Self Care | Payer: Self-pay | Source: Ambulatory Visit | Attending: Plastic Surgery

## 2020-05-25 DIAGNOSIS — Z6836 Body mass index (BMI) 36.0-36.9, adult: Secondary | ICD-10-CM | POA: Diagnosis not present

## 2020-05-25 DIAGNOSIS — M546 Pain in thoracic spine: Secondary | ICD-10-CM | POA: Diagnosis not present

## 2020-05-25 DIAGNOSIS — K219 Gastro-esophageal reflux disease without esophagitis: Secondary | ICD-10-CM | POA: Insufficient documentation

## 2020-05-25 DIAGNOSIS — M545 Low back pain: Secondary | ICD-10-CM | POA: Diagnosis not present

## 2020-05-25 DIAGNOSIS — E669 Obesity, unspecified: Secondary | ICD-10-CM | POA: Diagnosis not present

## 2020-05-25 DIAGNOSIS — M4004 Postural kyphosis, thoracic region: Secondary | ICD-10-CM | POA: Diagnosis not present

## 2020-05-25 DIAGNOSIS — G4730 Sleep apnea, unspecified: Secondary | ICD-10-CM | POA: Diagnosis not present

## 2020-05-25 DIAGNOSIS — N62 Hypertrophy of breast: Secondary | ICD-10-CM | POA: Diagnosis not present

## 2020-05-25 DIAGNOSIS — Z87891 Personal history of nicotine dependence: Secondary | ICD-10-CM | POA: Insufficient documentation

## 2020-05-25 DIAGNOSIS — E785 Hyperlipidemia, unspecified: Secondary | ICD-10-CM | POA: Diagnosis not present

## 2020-05-25 DIAGNOSIS — F411 Generalized anxiety disorder: Secondary | ICD-10-CM | POA: Diagnosis not present

## 2020-05-25 HISTORY — PX: BREAST REDUCTION SURGERY: SHX8

## 2020-05-25 SURGERY — MAMMOPLASTY, REDUCTION
Anesthesia: General | Site: Breast | Laterality: Bilateral

## 2020-05-25 MED ORDER — MIDAZOLAM HCL 5 MG/5ML IJ SOLN
INTRAMUSCULAR | Status: DC | PRN
  Administered 2020-05-25: 2 mg via INTRAVENOUS

## 2020-05-25 MED ORDER — CEFAZOLIN SODIUM-DEXTROSE 2-4 GM/100ML-% IV SOLN
2.0000 g | INTRAVENOUS | Status: AC
  Administered 2020-05-25: 2 g via INTRAVENOUS
  Filled 2020-05-25: qty 100

## 2020-05-25 MED ORDER — NALOXONE HCL 0.4 MG/ML IJ SOLN
INTRAMUSCULAR | Status: DC | PRN
Start: 2020-05-25 — End: 2020-05-25
  Administered 2020-05-25: 80 ug via INTRAVENOUS
  Administered 2020-05-25: 160 ug via INTRAVENOUS

## 2020-05-25 MED ORDER — LACTATED RINGERS IV SOLN: INTRAVENOUS | Status: DC | PRN

## 2020-05-25 MED ORDER — PROPOFOL 500 MG/50ML IV EMUL
INTRAVENOUS | Status: DC | PRN
Start: 2020-05-25 — End: 2020-05-25
  Administered 2020-05-25: 25 ug/kg/min via INTRAVENOUS

## 2020-05-25 MED ORDER — DEXAMETHASONE SODIUM PHOSPHATE 10 MG/ML IJ SOLN
INTRAMUSCULAR | Status: AC
  Filled 2020-05-25: qty 1

## 2020-05-25 MED ORDER — LACTATED RINGERS IV SOLN: INTRAVENOUS | Status: DC

## 2020-05-25 MED ORDER — PHENYLEPHRINE HCL-NACL 10-0.9 MG/250ML-% IV SOLN
INTRAVENOUS | Status: DC | PRN
Start: 2020-05-25 — End: 2020-05-25
  Administered 2020-05-25: 25 ug/min via INTRAVENOUS

## 2020-05-25 MED ORDER — PHENYLEPHRINE 40 MCG/ML (10ML) SYRINGE FOR IV PUSH (FOR BLOOD PRESSURE SUPPORT)
PREFILLED_SYRINGE | INTRAVENOUS | Status: DC | PRN
  Administered 2020-05-25: 120 ug via INTRAVENOUS
  Administered 2020-05-25: 80 ug via INTRAVENOUS

## 2020-05-25 MED ORDER — FENTANYL CITRATE (PF) 100 MCG/2ML IJ SOLN
INTRAMUSCULAR | Status: DC | PRN
  Administered 2020-05-25: 150 ug via INTRAVENOUS

## 2020-05-25 MED ORDER — MIDAZOLAM HCL 2 MG/2ML IJ SOLN
INTRAMUSCULAR | Status: AC
  Filled 2020-05-25: qty 2

## 2020-05-25 MED ORDER — FENTANYL CITRATE (PF) 100 MCG/2ML IJ SOLN: 25.0000 ug | INTRAMUSCULAR | Status: DC | PRN

## 2020-05-25 MED ORDER — ROCURONIUM BROMIDE 10 MG/ML (PF) SYRINGE
PREFILLED_SYRINGE | INTRAVENOUS | Status: AC
  Filled 2020-05-25: qty 10

## 2020-05-25 MED ORDER — EPINEPHRINE PF 1 MG/ML IJ SOLN
INTRAMUSCULAR | Status: AC
  Filled 2020-05-25: qty 1

## 2020-05-25 MED ORDER — KETAMINE HCL 50 MG/5ML IJ SOSY
PREFILLED_SYRINGE | INTRAMUSCULAR | Status: AC
  Filled 2020-05-25: qty 10

## 2020-05-25 MED ORDER — PROPOFOL 10 MG/ML IV BOLUS
INTRAVENOUS | Status: DC | PRN
  Administered 2020-05-25: 150 mg via INTRAVENOUS

## 2020-05-25 MED ORDER — SUGAMMADEX SODIUM 200 MG/2ML IV SOLN
INTRAVENOUS | Status: DC | PRN
  Administered 2020-05-25: 200 mg via INTRAVENOUS

## 2020-05-25 MED ORDER — FENTANYL CITRATE (PF) 250 MCG/5ML IJ SOLN
INTRAMUSCULAR | Status: AC
  Filled 2020-05-25: qty 5

## 2020-05-25 MED ORDER — LIDOCAINE 2% (20 MG/ML) 5 ML SYRINGE
INTRAMUSCULAR | Status: AC
  Filled 2020-05-25: qty 5

## 2020-05-25 MED ORDER — KETAMINE HCL 10 MG/ML IJ SOLN
INTRAMUSCULAR | Status: DC | PRN
Start: 2020-05-25 — End: 2020-05-25
  Administered 2020-05-25: 10 mg via INTRAVENOUS
  Administered 2020-05-25: 40 mg via INTRAVENOUS

## 2020-05-25 MED ORDER — PROPOFOL 1000 MG/100ML IV EMUL
INTRAVENOUS | Status: AC
  Filled 2020-05-25: qty 100

## 2020-05-25 MED ORDER — SODIUM BICARBONATE 4 % IV SOLN
Freq: Once | INTRAVENOUS | Status: DC
  Filled 2020-05-25 (×2): qty 50

## 2020-05-25 MED ORDER — PROPOFOL 10 MG/ML IV BOLUS
INTRAVENOUS | Status: AC
  Filled 2020-05-25: qty 40

## 2020-05-25 MED ORDER — ACETAMINOPHEN 500 MG PO TABS
1000.0000 mg | ORAL_TABLET | Freq: Once | ORAL | Status: DC
  Filled 2020-05-25: qty 2

## 2020-05-25 MED ORDER — SCOPOLAMINE 1 MG/3DAYS TD PT72
1.0000 | MEDICATED_PATCH | Freq: Once | TRANSDERMAL | Status: DC
  Administered 2020-05-25: 1.5 mg via TRANSDERMAL
  Filled 2020-05-25: qty 1

## 2020-05-25 MED ORDER — ONDANSETRON HCL 4 MG/2ML IJ SOLN
INTRAMUSCULAR | Status: AC
  Filled 2020-05-25: qty 2

## 2020-05-25 MED ORDER — CHLORHEXIDINE GLUCONATE CLOTH 2 % EX PADS: 6.0000 | MEDICATED_PAD | Freq: Once | CUTANEOUS | Status: DC

## 2020-05-25 MED ORDER — DEXAMETHASONE SODIUM PHOSPHATE 10 MG/ML IJ SOLN
INTRAMUSCULAR | Status: DC | PRN
  Administered 2020-05-25: 10 mg via INTRAVENOUS

## 2020-05-25 MED ORDER — PHENYLEPHRINE 40 MCG/ML (10ML) SYRINGE FOR IV PUSH (FOR BLOOD PRESSURE SUPPORT)
PREFILLED_SYRINGE | INTRAVENOUS | Status: AC
  Filled 2020-05-25: qty 10

## 2020-05-25 MED ORDER — 0.9 % SODIUM CHLORIDE (POUR BTL) OPTIME
TOPICAL | Status: DC | PRN
  Administered 2020-05-25: 1000 mL

## 2020-05-25 MED ORDER — ORAL CARE MOUTH RINSE: 15.0000 mL | Freq: Once | OROMUCOSAL | Status: AC

## 2020-05-25 MED ORDER — BUPIVACAINE HCL (PF) 0.25 % IJ SOLN
INTRAMUSCULAR | Status: AC
  Filled 2020-05-25: qty 30

## 2020-05-25 MED ORDER — ALBUTEROL SULFATE HFA 108 (90 BASE) MCG/ACT IN AERS
INHALATION_SPRAY | RESPIRATORY_TRACT | Status: DC | PRN
  Administered 2020-05-25: 8 via RESPIRATORY_TRACT

## 2020-05-25 MED ORDER — KETOROLAC TROMETHAMINE 30 MG/ML IJ SOLN
INTRAMUSCULAR | Status: DC | PRN
  Administered 2020-05-25: 30 mg via INTRAVENOUS

## 2020-05-25 MED ORDER — PROMETHAZINE HCL 25 MG/ML IJ SOLN: 6.2500 mg | INTRAMUSCULAR | Status: DC | PRN

## 2020-05-25 MED ORDER — SODIUM BICARBONATE 4 % IV SOLN
INTRAVENOUS | Status: DC | PRN
  Administered 2020-05-25 (×2): 1000 mL via INTRAMUSCULAR

## 2020-05-25 MED ORDER — LIDOCAINE 2% (20 MG/ML) 5 ML SYRINGE
INTRAMUSCULAR | Status: DC | PRN
  Administered 2020-05-25: 100 mg via INTRAVENOUS

## 2020-05-25 MED ORDER — CHLORHEXIDINE GLUCONATE 0.12 % MT SOLN
15.0000 mL | Freq: Once | OROMUCOSAL | Status: AC
  Administered 2020-05-25: 15 mL via OROMUCOSAL
  Filled 2020-05-25: qty 15

## 2020-05-25 MED ORDER — ONDANSETRON HCL 4 MG/2ML IJ SOLN
INTRAMUSCULAR | Status: DC | PRN
  Administered 2020-05-25: 4 mg via INTRAVENOUS

## 2020-05-25 MED ORDER — ROCURONIUM BROMIDE 10 MG/ML (PF) SYRINGE
PREFILLED_SYRINGE | INTRAVENOUS | Status: DC | PRN
  Administered 2020-05-25: 70 mg via INTRAVENOUS

## 2020-05-25 SURGICAL SUPPLY — 63 items
BAG DECANTER FOR FLEXI CONT (MISCELLANEOUS) ×3 IMPLANT
BENZOIN TINCTURE PRP APPL 2/3 (GAUZE/BANDAGES/DRESSINGS) ×9 IMPLANT
BLADE SURG 10 STRL SS (BLADE) ×6 IMPLANT
BLADE SURG 15 STRL LF DISP TIS (BLADE) ×2 IMPLANT
BLADE SURG 15 STRL SS (BLADE) ×6
BNDG ELASTIC 6X15 VLCR STRL LF (GAUZE/BANDAGES/DRESSINGS) ×3 IMPLANT
BNDG GAUZE ELAST 4 BULKY (GAUZE/BANDAGES/DRESSINGS) IMPLANT
BRUSH SCRUB EZ PLAIN DRY (MISCELLANEOUS) ×3 IMPLANT
CHLORAPREP W/TINT 26 (MISCELLANEOUS) ×6 IMPLANT
CLIP VESOCCLUDE MED 6/CT (CLIP) ×3 IMPLANT
CLOSURE STERI-STRIP 1/2X4 (GAUZE/BANDAGES/DRESSINGS) ×4
CLOSURE WOUND 1/2 X4 (GAUZE/BANDAGES/DRESSINGS) ×3
CLSR STERI-STRIP ANTIMIC 1/2X4 (GAUZE/BANDAGES/DRESSINGS) ×8 IMPLANT
COVER WAND RF STERILE (DRAPES) IMPLANT
DECANTER SPIKE VIAL GLASS SM (MISCELLANEOUS) IMPLANT
DRAIN CHANNEL 15F RND FF W/TCR (WOUND CARE) IMPLANT
DRAPE LAPAROSCOPIC ABDOMINAL (DRAPES) ×3 IMPLANT
DRAPE UTILITY XL STRL (DRAPES) ×6 IMPLANT
DRSG PAD ABDOMINAL 8X10 ST (GAUZE/BANDAGES/DRESSINGS) IMPLANT
ELECT REM PT RETURN 9FT ADLT (ELECTROSURGICAL) ×3
ELECTRODE REM PT RTRN 9FT ADLT (ELECTROSURGICAL) ×1 IMPLANT
EVACUATOR SILICONE 100CC (DRAIN) IMPLANT
GAUZE SPONGE 4X4 12PLY STRL (GAUZE/BANDAGES/DRESSINGS) ×6 IMPLANT
GAUZE XEROFORM 5X9 LF (GAUZE/BANDAGES/DRESSINGS) ×6 IMPLANT
GLOVE BIO SURGEON STRL SZ 6.5 (GLOVE) IMPLANT
GLOVE BIO SURGEON STRL SZ7.5 (GLOVE) IMPLANT
GLOVE BIO SURGEONS STRL SZ 6.5 (GLOVE)
GLOVE BIOGEL M STRL SZ7.5 (GLOVE) ×6 IMPLANT
GLOVE BIOGEL PI IND STRL 8 (GLOVE) ×1 IMPLANT
GLOVE BIOGEL PI INDICATOR 8 (GLOVE) ×2
GLOVE ECLIPSE 6.5 STRL STRAW (GLOVE) IMPLANT
GOWN STRL REUS W/ TWL LRG LVL3 (GOWN DISPOSABLE) ×2 IMPLANT
GOWN STRL REUS W/TWL LRG LVL3 (GOWN DISPOSABLE) ×6
KIT BASIN OR (CUSTOM PROCEDURE TRAY) ×6 IMPLANT
MARKER SKIN DUAL TIP RULER LAB (MISCELLANEOUS) IMPLANT
NDL SAFETY ECLIPSE 18X1.5 (NEEDLE) ×1 IMPLANT
NEEDLE HYPO 18GX1.5 SHARP (NEEDLE) ×3
NEEDLE SPNL 18GX3.5 QUINCKE PK (NEEDLE) ×3 IMPLANT
NS IRRIG 1000ML POUR BTL (IV SOLUTION) ×3 IMPLANT
PACK GENERAL/GYN (CUSTOM PROCEDURE TRAY) ×3 IMPLANT
PAD ABD 8X10 STRL (GAUZE/BANDAGES/DRESSINGS) ×12 IMPLANT
PENCIL SMOKE EVACUATOR (MISCELLANEOUS) ×3 IMPLANT
PIN SAFETY STERILE (MISCELLANEOUS) IMPLANT
SHEET MEDIUM DRAPE 40X70 STRL (DRAPES) IMPLANT
SLEEVE SCD COMPRESS KNEE MED (MISCELLANEOUS) ×3 IMPLANT
SPONGE LAP 18X18 RF (DISPOSABLE) ×3 IMPLANT
STAPLER INSORB 30 2030 C-SECTI (MISCELLANEOUS) ×6 IMPLANT
STAPLER VISISTAT 35W (STAPLE) ×3 IMPLANT
STRIP CLOSURE SKIN 1/2X4 (GAUZE/BANDAGES/DRESSINGS) ×6 IMPLANT
SUT CHROMIC 4 0 PS 2 18 (SUTURE) ×12 IMPLANT
SUT ETHILON 2 0 FS 18 (SUTURE) IMPLANT
SUT ETHILON 3 0 PS 1 (SUTURE) IMPLANT
SUT MNCRL AB 4-0 PS2 18 (SUTURE) IMPLANT
SUT PDS AB 2-0 CT2 27 (SUTURE) IMPLANT
SUT PDS AB 3-0 SH 27 (SUTURE) ×6 IMPLANT
SUT VIC AB 3-0 PS1 18 (SUTURE)
SUT VIC AB 3-0 PS1 18XBRD (SUTURE) IMPLANT
SUT VLOC 90 P-14 23 (SUTURE) ×6 IMPLANT
SYR 50ML LL SCALE MARK (SYRINGE) ×6 IMPLANT
TAPE MEASURE VINYL STERILE (MISCELLANEOUS) ×3 IMPLANT
TOWEL GREEN STERILE FF (TOWEL DISPOSABLE) ×6 IMPLANT
TRAY FOLEY W/BAG SLVR 14FR LF (SET/KITS/TRAYS/PACK) IMPLANT
UNDERPAD 30X36 HEAVY ABSORB (UNDERPADS AND DIAPERS) ×6 IMPLANT

## 2020-05-25 NOTE — Brief Op Note (Signed)
05/25/2020  10:10 AM  PATIENT:  Fritzi Mandes Freer  59 y.o. female  PRE-OPERATIVE DIAGNOSIS:  macromastia  POST-OPERATIVE DIAGNOSIS:  macromastia  PROCEDURE:  Procedure(s) with comments: MAMMARY REDUCTION  (BREAST) WITH LIPO (Bilateral) - 3 hours please  SURGEON:  Surgeon(s) and Role:    * Skarlett Sedlacek, Wendy Poet, MD - Primary  PHYSICIAN ASSISTANT: None  ASSISTANTS: Bonita Cox, RNFA   ANESTHESIA:   general  EBL:  100 mL   BLOOD ADMINISTERED:none  DRAINS: none   LOCAL MEDICATIONS USED:  MARCAINE     SPECIMEN:  Source of Specimen:  r and l breast tissue  DISPOSITION OF SPECIMEN:  PATHOLOGY  COUNTS:  YES  TOURNIQUET:  * No tourniquets in log *  DICTATION: .Dragon Dictation  PLAN OF CARE: Discharge to home after PACU  PATIENT DISPOSITION:  PACU - hemodynamically stable.   Delay start of Pharmacological VTE agent (>24hrs) due to surgical blood loss or risk of bleeding: not applicable

## 2020-05-25 NOTE — Anesthesia Postprocedure Evaluation (Signed)
Anesthesia Post Note  Patient: Jennifer Harmon  Procedure(s) Performed: MAMMARY REDUCTION  (BREAST) WITH LIPO (Bilateral Breast)     Patient location during evaluation: PACU Anesthesia Type: General Level of consciousness: awake and alert, oriented and awake Pain management: pain level controlled Vital Signs Assessment: post-procedure vital signs reviewed and stable Respiratory status: spontaneous breathing, nonlabored ventilation and respiratory function stable Cardiovascular status: blood pressure returned to baseline and stable Postop Assessment: no apparent nausea or vomiting Anesthetic complications: no   No complications documented.  Last Vitals:  Vitals:   05/25/20 1200 05/25/20 1215  BP: 115/69 118/70  Pulse: 96 91  Resp: (!) 30 (!) 22  Temp:  36.8 C  SpO2: 96% 94%    Last Pain:  Vitals:   05/25/20 1215  TempSrc:   PainSc: 0-No pain                 Cecile Hearing

## 2020-05-25 NOTE — Interval H&P Note (Signed)
History and Physical Interval Note:  05/25/2020 7:42 AM  Jennifer Harmon  has presented today for surgery, with the diagnosis of macromastia.  The various methods of treatment have been discussed with the patient and family. After consideration of risks, benefits and other options for treatment, the patient has consented to  Procedure(s) with comments: MAMMARY REDUCTION  (BREAST) WITH LIPO (Bilateral) - 3 hours please as a surgical intervention.  The patient's history has been reviewed, patient examined, no change in status, stable for surgery.  I have reviewed the patient's chart and labs.  Questions were answered to the patient's satisfaction.     Allena Napoleon

## 2020-05-25 NOTE — Anesthesia Procedure Notes (Signed)
Procedure Name: Intubation Date/Time: 05/25/2020 7:50 AM Performed by: Candis Shine, CRNA Pre-anesthesia Checklist: Patient identified, Emergency Drugs available, Suction available and Patient being monitored Patient Re-evaluated:Patient Re-evaluated prior to induction Oxygen Delivery Method: Circle System Utilized Preoxygenation: Pre-oxygenation with 100% oxygen Induction Type: IV induction Ventilation: Mask ventilation without difficulty and Oral airway inserted - appropriate to patient size Laryngoscope Size: Mac and 3 Grade View: Grade II Tube type: Oral Tube size: 7.0 mm Number of attempts: 1 Airway Equipment and Method: Stylet and Oral airway Placement Confirmation: ETT inserted through vocal cords under direct vision,  positive ETCO2 and breath sounds checked- equal and bilateral Secured at: 22 cm Tube secured with: Tape Dental Injury: Teeth and Oropharynx as per pre-operative assessment

## 2020-05-25 NOTE — Discharge Instructions (Signed)
Activity As tolerated: NO showers for 3 days No heavy activities  Diet: Regular  Wound Care: Keep dressing clean & dry for 3 days.  Keep wrap applied with compression as much as possible.    Do not change dressings for 3 days unless soiled.  Can change if needed but make sure to reapply wrap. After three days can remove wrap and shower.  Then reapply dressings if needed and continue compression with wrap or soft sports bra. Call doctor if any unusual problems occur such as pain, excessive bleeding, unrelieved nausea/vomiting, fever &/or chills  Follow-up appointment: Scheduled for next week.  

## 2020-05-25 NOTE — Op Note (Signed)
Operative Note   DATE OF OPERATION: 05/25/2020  LOCATION: [facility]   SURGICAL DEPARTMENT: Plastic Surgery  PREOPERATIVE DIAGNOSES: Bilateral symptomatic macromastia.  POSTOPERATIVE DIAGNOSES:  same  PROCEDURE: Bilateral breast reduction with free nipple graft.  SURGEON: Ancil Linsey, MD  ASSISTANT: Enedina Finner, RNFA The advanced practice practitioner (APP) assisted throughout the case.  The APP was essential in retraction and counter traction when needed to make the case progress smoothly.  This retraction and assistance made it possible to see the tissue plans for the procedure.  The assistance was needed for blood control, tissue re-approximation and assisted with closure of the incision site.  ANESTHESIA: General.  COMPLICATIONS: None.   INDICATIONS FOR PROCEDURE:  The patient, Jennifer Harmon is a 59 y.o. female born on 03/11/1961, is here for treatment of bilateral symptomatic macromastia. MRN: [id number]  CONSENT:  Informed consent was obtained directly from the patient. Risks, benefits and alternatives were fully discussed. Specific risks including but not limited to bleeding, infection, hematoma, seroma, scarring, pain, infection, contracture, asymmetry, wound healing problems, nipple necrosis, nipple numbness and need for further surgery were all discussed. The patient did have an ample opportunity to have questions answered to satisfaction.   DESCRIPTION OF PROCEDURE:  The patient was marked preoperatively for a Wise pattern skin excision.  The patient was taken to the operating room. SCDs were placed and antibiotics were given. General anesthesia was administered.The patient's operative site was prepped and draped in a sterile fashion. A time out was performed and all information was confirmed to be correct.  Right Breast: The breast was infiltrated with tumescent solution to help with hemostasis.  The nipple was marked with a cookie cutter and  excised in preparation for grafting.  The excision was then performed with cautery.  Hemostasis was obtained and the wound was stapled closed.  Left breast:  The breast was infiltrated with tumescent solution to help with hemostasis.  The nipple was marked with a cookie cutter and excised in preparation for grafting.  The excision was then performed with cautery.  Hemostasis was obtained and the wound was stapled closed.  Patient was then checked for size and symmetry.  Minor modifications were made.  This resulted in a total of 3390 g removed from the right side and 3725 g removed from the left side.  The inframammary incision was closed with a combination of buried in-sorb staples and a running 3-0 Quill suture.  The vertical and limbs were closed with interrupted buried 4-0 Monocryl and a running 4-0 Quill suture.  Nipple location was marked with a 87mm cookie cutter and deepithelialized.  Nipple grafts were then thinned out and inset with 4-0 Chromic.  Xeroform and a scrub brush foam were used to fashion and bolster that was secured with 4-0 Nylon.  Steri-Strips were then applied along with a soft dressing and Ace wrap.  The patient tolerated the procedure well.  There were no complications. The patient was allowed to wake from anesthesia, extubated and taken to the recovery room in satisfactory condition.  I was present for the entire procedure.

## 2020-05-25 NOTE — Transfer of Care (Signed)
Immediate Anesthesia Transfer of Care Note  Patient: Jennifer Harmon  Procedure(s) Performed: MAMMARY REDUCTION  (BREAST) WITH LIPO (Bilateral Breast)  Patient Location: PACU  Anesthesia Type:General  Level of Consciousness: drowsy  Airway & Oxygen Therapy: Patient Spontanous Breathing and Patient connected to face mask oxygen  Post-op Assessment: Report given to RN and Post -op Vital signs reviewed and stable  Post vital signs: Reviewed and stable  Last Vitals:  Vitals Value Taken Time  BP 116/60 05/25/20 1042  Temp    Pulse 104 05/25/20 1043  Resp 23 05/25/20 1044  SpO2 97 % 05/25/20 1043  Vitals shown include unvalidated device data.  Last Pain:  Vitals:   05/25/20 0629  TempSrc:   PainSc: 6       Patients Stated Pain Goal: 2 (05/25/20 1155)  Complications: No complications documented.

## 2020-05-26 ENCOUNTER — Encounter (HOSPITAL_COMMUNITY): Payer: Self-pay | Admitting: Plastic Surgery

## 2020-05-26 LAB — SURGICAL PATHOLOGY

## 2020-05-27 ENCOUNTER — Telehealth: Payer: Self-pay

## 2020-05-27 NOTE — Telephone Encounter (Signed)
Patient is doing well, husband had some questions about the small opening noted at the lateral aspect of her incision under her armpit.  He reports that he has noticed some drainage from this.  He reports otherwise she is feeling fine and everything has been going well.  I explained to Dorinda Hill, her husband that this was normal and we leave this incision to allow fluid to drain and not to collect within the breast.  He stated he was relieved and will call us with any further questions or concerns

## 2020-05-27 NOTE — Telephone Encounter (Signed)
Patient's husband called to state that patient's bandage was leaking blood last night and is still presently draining at the top of the incision under the right arm. He is also concerned at the dressing changes as he does not know how to do these. Husband would like a call back at [phone].

## 2020-06-03 ENCOUNTER — Ambulatory Visit (INDEPENDENT_AMBULATORY_CARE_PROVIDER_SITE_OTHER): Payer: PPO | Admitting: Plastic Surgery

## 2020-06-03 ENCOUNTER — Encounter: Payer: Self-pay | Admitting: Plastic Surgery

## 2020-06-03 ENCOUNTER — Other Ambulatory Visit: Payer: Self-pay

## 2020-06-03 VITALS — BP 138/88 | HR 93 | Temp 98.4°F

## 2020-06-03 DIAGNOSIS — N62 Hypertrophy of breast: Secondary | ICD-10-CM

## 2020-06-03 NOTE — Progress Notes (Signed)
Patient is here 1 week postop from bilateral breast reduction.  Overall she thinks she is doing fine.  She did have a little bit of drainage coming from the right side but that has since stopped.  Her pain is under control.  On exam all of her incisions are intact and everything looks fine.  There is minimal bruising or subcutaneous swelling.  The bolsters were removed from the nipples and the grafts seem to be adhering appropriately.  They were redressed with ointment and a Telfa gauze.  I encouraged her to continue to compression which she is planning on doing and avoid strenuous activities.  We will plan to see her back in the next week or 2 to check her progress.

## 2020-06-05 DIAGNOSIS — K219 Gastro-esophageal reflux disease without esophagitis: Secondary | ICD-10-CM | POA: Diagnosis not present

## 2020-06-05 DIAGNOSIS — M1990 Unspecified osteoarthritis, unspecified site: Secondary | ICD-10-CM | POA: Diagnosis not present

## 2020-06-05 DIAGNOSIS — E785 Hyperlipidemia, unspecified: Secondary | ICD-10-CM | POA: Diagnosis not present

## 2020-06-09 ENCOUNTER — Ambulatory Visit: Payer: PPO | Admitting: Pain Medicine

## 2020-06-10 ENCOUNTER — Other Ambulatory Visit: Payer: Self-pay

## 2020-06-10 ENCOUNTER — Encounter: Payer: Self-pay | Admitting: Surgical

## 2020-06-10 ENCOUNTER — Ambulatory Visit (INDEPENDENT_AMBULATORY_CARE_PROVIDER_SITE_OTHER): Payer: PPO | Admitting: Surgical

## 2020-06-10 VITALS — BP 126/80 | HR 97 | Temp 97.9°F

## 2020-06-10 DIAGNOSIS — M4004 Postural kyphosis, thoracic region: Secondary | ICD-10-CM

## 2020-06-10 DIAGNOSIS — Z9889 Other specified postprocedural states: Secondary | ICD-10-CM

## 2020-06-10 DIAGNOSIS — M545 Pain in thoracic spine: Secondary | ICD-10-CM

## 2020-06-10 DIAGNOSIS — M546 Pain in thoracic spine: Secondary | ICD-10-CM

## 2020-06-10 DIAGNOSIS — N62 Hypertrophy of breast: Secondary | ICD-10-CM

## 2020-06-10 NOTE — Progress Notes (Signed)
Patient is a 59 year old female here for follow-up after free nipple graft bilateral breast reduction on 05/25/2020 with Dr. Arita Miss.  Patient is here with her husband today.  They report overall she is doing well, they have been applying Vaseline and triple antibiotic ointment to NAC bilaterally.  They report that they noticed the top layer of her NAC has sloughed off and some portions.  I discussed with them that this is normal and pigment will return over time.  She is not having any fevers, chills, nausea, vomiting.  She has been wearing a sports bra 24/7, unfortunately the bra she is currently wearing is fairly loose and was not providing much compression.  On exam bilateral NAC's with sloughing epithelium noted, dermis with good color and no sign of necrosis.  No large dehiscence noted.  She does have a few small wounds noted at the junction of the inframammary fold and vertical limb of bilateral breasts.  Bilateral breasts are soft, some fluid collection palpated in the right breast on exam.  No erythema or cellulitis noted.   Overall Mrs. Noblett is doing well, no sign of infection or hematoma. I was able to aspirate 230 cc of serous fluid from right breast using a sterile technique.  Patient tolerated this well without any complications. No fluid was aspirated from the left breast.  Recommend continuing to apply Vaseline daily to bilateral NAC's as well as Vaseline daily to wounds noted on exam today.  Discussed with patient and husband instructions and they both agreed and understood.  Recommend finding a sports bra that provides more compression.  I did wrap her breasts with an Ace wrap today to assist with compression and decreased risk of increased seroma.  I would like to see them back in 1 week for reevaluation of swelling.  I discussed with patient and her husband if she is feeling well next week she can cancel that appointment and reschedule for 2 weeks.

## 2020-06-11 ENCOUNTER — Ambulatory Visit: Payer: PPO | Attending: Pain Medicine | Admitting: Pain Medicine

## 2020-06-11 ENCOUNTER — Ambulatory Visit: Payer: PPO | Admitting: Psychology

## 2020-06-11 ENCOUNTER — Encounter: Payer: Self-pay | Admitting: Pain Medicine

## 2020-06-11 VITALS — BP 116/54 | HR 101 | Temp 97.7°F | Resp 18 | Ht 63.0 in | Wt 202.0 lb

## 2020-06-11 DIAGNOSIS — M542 Cervicalgia: Secondary | ICD-10-CM | POA: Insufficient documentation

## 2020-06-11 DIAGNOSIS — M549 Dorsalgia, unspecified: Secondary | ICD-10-CM | POA: Diagnosis not present

## 2020-06-11 DIAGNOSIS — G8929 Other chronic pain: Secondary | ICD-10-CM | POA: Diagnosis not present

## 2020-06-11 DIAGNOSIS — M25561 Pain in right knee: Secondary | ICD-10-CM | POA: Insufficient documentation

## 2020-06-11 DIAGNOSIS — M5442 Lumbago with sciatica, left side: Secondary | ICD-10-CM | POA: Insufficient documentation

## 2020-06-11 DIAGNOSIS — M961 Postlaminectomy syndrome, not elsewhere classified: Secondary | ICD-10-CM | POA: Diagnosis not present

## 2020-06-11 DIAGNOSIS — M4722 Other spondylosis with radiculopathy, cervical region: Secondary | ICD-10-CM | POA: Insufficient documentation

## 2020-06-11 DIAGNOSIS — G894 Chronic pain syndrome: Secondary | ICD-10-CM | POA: Insufficient documentation

## 2020-06-11 DIAGNOSIS — F1120 Opioid dependence, uncomplicated: Secondary | ICD-10-CM | POA: Insufficient documentation

## 2020-06-11 DIAGNOSIS — Z79899 Other long term (current) drug therapy: Secondary | ICD-10-CM | POA: Diagnosis not present

## 2020-06-11 DIAGNOSIS — M5441 Lumbago with sciatica, right side: Secondary | ICD-10-CM | POA: Insufficient documentation

## 2020-06-11 DIAGNOSIS — M25562 Pain in left knee: Secondary | ICD-10-CM | POA: Diagnosis not present

## 2020-06-11 MED ORDER — OXYCODONE HCL 5 MG PO TABS: 5.0000 mg | ORAL_TABLET | Freq: Three times a day (TID) | ORAL | 0 refills | Status: DC | PRN

## 2020-06-11 NOTE — Progress Notes (Signed)
Nursing Pain Medication Assessment:  Safety precautions to be maintained throughout the outpatient stay will include: orient to surroundings, keep bed in low position, maintain call bell within reach at all times, provide assistance with transfer out of bed and ambulation.  Medication Inspection Compliance: Pill count conducted under aseptic conditions, in front of the patient. Neither the pills nor the bottle was removed from the patient's sight at any time. Once count was completed pills were immediately returned to the patient in their original bottle.  Medication: Oxycodone IR Pill/Patch Count: 67 of 90 pills remain Pill/Patch Appearance: Markings consistent with prescribed medication Bottle Appearance: Standard pharmacy container. Clearly labeled. Filled Date:07/ 27 / 2021 Last Medication intake:  Today

## 2020-06-11 NOTE — Progress Notes (Addendum)
PROVIDER NOTE: Information contained herein reflects review and annotations entered in association with encounter. Interpretation of such information and data should be left to medically-trained personnel. Information provided to patient can be located elsewhere in the medical record under "Patient Instructions". Document created using STT-dictation technology, any transcriptional errors that may result from process are unintentional.    Patient: Jennifer Harmon  Service Category: E/M  Provider:  A , MD  DOB: 10/09/1961  DOS: 06/11/2020  Specialty: Interventional Pain Management  MRN: [id number]  Setting: Ambulatory outpatient  PCP: Vyas, Dhruv B, MD  Type: Established Patient    Referring Provider: Vyas, Dhruv B, MD  Location: Office  Delivery: Face-to-face     HPI  Reason for encounter: Jennifer Harmon, a 59 y.o. year old female, is here today for evaluation and management of her Chronic pain syndrome [G89.4]. Jennifer Harmon's primary complain today is Neck Pain, Leg Pain (left thigh), and Knee Pain Last encounter: Practice (05/18/2020). My last encounter with her was on 05/18/2020. Pertinent problems: Jennifer Harmon has Fibromyalgia; DDD (degenerative disc disease), lumbar; S/P partial knee replacement (Left); S/P total knee replacement (Right); Osteoarthritis of feet (Bilateral); Chronic inflammatory arthritis; History of knee arthroplasty (Bilateral); Osteoarthritis of knee (Right); Symptomatic mammary hypertrophy; Chronic upper back pain (1ry area of Pain) (Bilateral) (R>L); Chronic neck pain (3ry area of Pain) (Bilateral) (R>L); Chronic shoulder pain (Bilateral) (R>L); Chronic upper extremity pain (Bilateral) (R>L); Chronic pain syndrome; Chronic sacroiliac joint pain (Bilateral) (R>L); Chronic low back pain (2ry area of Pain) (Bilateral) (R>L); Chronic knee pain (4th area of Pain) (Bilateral) (R>L); Neurogenic pain; Chronic musculoskeletal pain; Chronic recurrent lower extremity muscle  spasms (Bilateral); Thoracic radiculopathy due to osteoarthritis of spine; Thoracic radiculitis (T6/T7) (Right); Chronic lower extremity pain (Bilateral) (R>L); Chronic lower extremity radicular pain (L5) (Right); DISH (diffuse idiopathic skeletal hyperostosis); 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; DDD (degenerative disc disease), thoracic; Failed back surgical syndrome; Fusion of lumbar spine; Lumbar facet syndrome (Bilateral) (R>L); Lumbar paraspinal muscle spasm; Spondylosis without myelopathy or radiculopathy, lumbosacral region; Chronic hip pain (Bilateral) (R>L); Other specified dorsopathies, sacral and sacrococcygeal region; Osteoarthritis of hip (Bilateral) (R>L); Thoracic spondylosis with radiculopathy; Pain due to total right knee replacement (HCC); Chronic knee pain after total replacement (TKR) (Right); DDD (degenerative disc disease), cervical; Cervical spondylosis with radiculopathy (Right); Night cramps; S/P Revision of total knee replacement (Right); Abnormal MRI, cervical spine; Trigger point with back pain; Acute exacerbation of chronic low back pain; Abnormal MRI, lumbar spine (02/18/2020); Lumbar central spinal stenosis, w/ neurogenic claudication (L3-4); Lumbar foraminal stenosis (Right) (L3-4); and Cervicalgia on their pertinent problem list. Pain Assessment: Severity of Chronic pain is reported as a 6 /10. Location: Neck Right, Left (right is worse)/radiates into right shoulder. Onset: More than a month ago. Quality: Shooting. Timing: Constant. Modifying factor(s): denies. Vitals:  height is 5' 3" (1.6 m) and weight is 202 lb (91.6 kg). Her temperature is 97.7 F (36.5 C). Her blood pressure is 116/54 (abnormal) and her pulse is 101 (abnormal). Her respiration is 18 and oxygen saturation is 95%.   The patient returns to the clinic today for medication management. The patient indicates doing well with the current medication regimen. No adverse reactions or side effects reported to  the medications.  She finally had her breast reduction surgery around July 19th.  She still recovering from that surgery and she has accumulated some fluid in the chest area where she had her breasts removed.  She can tell a difference, but   unfortunately she is not able to sleep in bed because she will turn on her side and she has opted to sleep in a chair.  Unfortunately this has caused for her to fall asleep and end up with her neck in odd positions.  Of course, this has in turn worsen her neck pain and she comes in today to see if there is anything that we can do.  Because of how recent her surgery is, I informed the patient that I rather not do anything until she fully recovers from it.  However, I suggested that she go to the pharmacy and buy a soft collar and use it every time that she goes to sleep and also to incline her chair a little bit further back so that her neck is not having to carry the weight of her head falling forward.  For the purpose of today's visit I reviewed the patient's last cervical MRI seen below.  Today we have informed the patient that we will be transferring her nonopioid analgesics to her primary care physician to see if they can help us with that so that we can concentrate on her opioids.  Her none opioid medications are: Cyclobenzaprine (Flexeril) 10 mg, 1 tablet p.o. 3 times daily (#90); baclofen (Lioresal) 10 mg, 1 to 2 tablets p.o. at bedtime (#60); magnesium oxide 500 mg, 1 cap p.o. at bedtime (#30); omeprazole (Nexium) 40 mg, 1 cap p.o. at bedtime (#30); pregabalin (Lyrica) 150 mg, 1 cap p.o. 3 times daily (#90); calcium carbonate (calcium 600) 600 mg, 1 tab p.o. twice daily (#60).  The patient should have enough to last until 09/04/2020.  MRI of the cervical spine done on 04/12/2019 reads: Disc levels: C3-4: Mild degenerative disc disease and right-sided facet disease but no disc protrusions, spinal or foraminal stenosis. C4-5: Bulging annulus and mild osteophytic  ridging with slight flattening of the ventral thecal sac and slight narrowing the ventral CSF space. There is also right-sided uncinate spurring change contributing to mild right foraminal narrowing. C5-6: Mild annular bulge but no spinal stenosis. Uncinate spurring changes bilaterally contributing to mild bilateral foraminal narrowing. C6-7: Degenerative disc disease with a slight bulging annulus but no spinal stenosis. Right-sided disc osteophyte complex contributing to mild right foraminal stenosis.  IMPRESSION: 1. Mild-to-moderate degenerative cervical spondylosis with disc disease and facet disease. 2. Mild right foraminal stenosis at C4-5 and C6-7. 3. Mild bilateral foraminal stenosis at C5-6.  Pharmacotherapy Assessment   Analgesic: Oxycodone IR 5 mg, 1 tab PO q 8 hrs (15 mg/day of oxycodone) MME/day: 22.5 mg/day.   Monitoring: [place] PMP: PDMP reviewed during this encounter.       Pharmacotherapy: No side-effects or adverse reactions reported. Compliance: No problems identified. Effectiveness: Clinically acceptable.  Tice, Kori M, RN  06/11/2020  2:11 PM  Signed Nursing Pain Medication Assessment:  Safety precautions to be maintained throughout the outpatient stay will include: orient to surroundings, keep bed in low position, maintain call bell within reach at all times, provide assistance with transfer out of bed and ambulation.  Medication Inspection Compliance: Pill count conducted under aseptic conditions, in front of the patient. Neither the pills nor the bottle was removed from the patient's sight at any time. Once count was completed pills were immediately returned to the patient in their original bottle.  Medication: Oxycodone IR Pill/Patch Count: 67 of 90 pills remain Pill/Patch Appearance: Markings consistent with prescribed medication Bottle Appearance: Standard pharmacy container. Clearly labeled. Filled Date:07/ 27 / 2021 Last Medication intake:    Today    UDS:   Summary  Date Value Ref Range Status  03/05/2020 Note  Final    Comment:    ==================================================================== [employer] (MW) ==================================================================== Test                             Result       Flag       Units Drug Present   7-aminoclonazepam              817                     ng/mg creat    7-aminoclonazepam is an expected metabolite of clonazepam. Source of    clonazepam is a scheduled prescription medication.   Oxycodone                      1431                    ng/mg creat   Oxymorphone                    806                     ng/mg creat   Noroxycodone                   2398                    ng/mg creat   Noroxymorphone                 150                     ng/mg creat    Sources of oxycodone are scheduled prescription medications.    Oxymorphone, noroxycodone, and noroxymorphone are expected    metabolites of oxycodone. Oxymorphone is also available as a    scheduled prescription medication. ==================================================================== Test                      Result    Flag   Units      Ref Range   Creatinine              52               mg/dL      >=20 ==================================================================== Declared Medications:  Medication list was not provided. ==================================================================== For clinical consultation, please call [phone]. ====================================================================      ROS  Constitutional: Denies any fever or chills Gastrointestinal: No reported hemesis, hematochezia, vomiting, or acute GI distress Musculoskeletal: Denies any acute onset joint swelling, redness, loss of ROM, or weakness Neurological: No reported episodes of acute onset apraxia, aphasia, dysarthria, agnosia, amnesia, paralysis, loss of coordination, or loss of  consciousness  Medication Review  Magnesium Oxide, Multi-Vitamin, acetaminophen, baclofen, calcium carbonate, citalopram, clonazePAM, cyclobenzaprine, diclofenac Sodium, esomeprazole, latanoprost, ondansetron, oxyCODONE, pregabalin, and rosuvastatin  History Review  Allergy: Jennifer Harmon is allergic to aspirin, bupropion, ibuprofen, and tape. Drug: Jennifer Harmon  reports no history of drug use. Alcohol:  reports no history of alcohol use. Tobacco:  reports that she quit smoking about 4 years ago. Her smoking use included pipe and cigarettes. She has a 1.50 pack-year smoking history. She has never used smokeless tobacco. Social: Jennifer Harmon  reports that she quit smoking about 4  years ago. Her smoking use included pipe and cigarettes. She has a 1.50 pack-year smoking history. She has never used smokeless tobacco. She reports that she does not drink alcohol and does not use drugs. Medical:  has a past medical history of Acute postoperative pain (05/03/2018), Anxiety, Chronic chest pain, Depression, DJD (degenerative joint disease), Fibromyalgia, GERD (gastroesophageal reflux disease), and Hypercholesterolemia. Surgical: Jennifer Harmon  has a past surgical history that includes Cholecystectomy; Cesarean section; Back surgery (2008); Joint replacement (Right, 04/16/2019); and Breast reduction surgery (Bilateral, 05/25/2020). Family: family history includes Arthritis in her father; Hypertension in her mother.  Laboratory Chemistry Profile   Renal Lab Results  Component Value Date   BUN 12 11/05/2019   CREATININE 0.53 11/05/2019   GFRAA >60 11/05/2019   GFRNONAA >60 11/05/2019     Hepatic Lab Results  Component Value Date   AST 17 11/05/2019   ALT 20 11/05/2019   ALBUMIN 4.0 11/05/2019   ALKPHOS 109 11/05/2019     Electrolytes Lab Results  Component Value Date   NA 141 11/05/2019   K 3.7 11/05/2019   CL 102 11/05/2019   CALCIUM 9.3 11/05/2019   MG 2.2 11/05/2019     Bone Lab Results   Component Value Date   VD25OH 21.15 (L) 11/05/2019     Inflammation (CRP: Acute Phase) (ESR: Chronic Phase) Lab Results  Component Value Date   CRP 0.6 11/05/2019   ESRSEDRATE 21 11/05/2019       Note: Above Lab results reviewed.  Recent Imaging Review  DG PAIN CLINIC C-ARM 1-60 MIN NO REPORT Fluoro was used, but no Radiologist interpretation will be provided.  Please refer to "NOTES" tab for provider progress note. Note: Reviewed        Physical Exam  General appearance: Well nourished, well developed, and well hydrated. In no apparent acute distress Mental status: Alert, oriented x 3 (person, place, & time)       Respiratory: No evidence of acute respiratory distress Eyes: PERLA Vitals: BP (!) 116/54   Pulse (!) 101   Temp 97.7 F (36.5 C)   Resp 18   Ht 5' 3" (1.6 m)   Wt 202 lb (91.6 kg)   SpO2 95%   BMI 35.78 kg/m  BMI: Estimated body mass index is 35.78 kg/m as calculated from the following:   Height as of this encounter: 5' 3" (1.6 m).   Weight as of this encounter: 202 lb (91.6 kg). Ideal: Ideal body weight: 52.4 kg (115 lb 8.3 oz) Adjusted ideal body weight: 68.1 kg (150 lb 1.8 oz)  Assessment   Status Diagnosis  Controlled Controlled Controlled 1. Chronic pain syndrome   2. Cervicalgia   3. Cervical spondylosis with radiculopathy (Right)   4. Chronic upper back pain (1ry area of Pain) (Bilateral) (R>L)   5. Chronic low back pain (2ry area of Pain) (Bilateral) (R>L)   6. Chronic knee pain (4th area of Pain) (Bilateral) (R>L)   7. Failed back surgical syndrome   8. Pharmacologic therapy   9. Uncomplicated opioid dependence (HCC)      Updated Problems: Problem  Cervicalgia  Chronic low back pain (2ry area of Pain) (Bilateral) (R>L)  Chronic knee pain (4th area of Pain) (Bilateral) (R>L)  Chronic upper back pain (1ry area of Pain) (Bilateral) (R>L)  Chronic neck pain (3ry area of Pain) (Bilateral) (R>L)  Uncomplicated Opioid Dependence (Hcc)     Plan of Care  Problem-specific:  No problem-specific Assessment & Plan notes found for this   encounter.  Jennifer Harmon has a current medication list which includes the following long-term medication(s): baclofen, calcium carbonate, clonazepam, cyclobenzaprine, esomeprazole, magnesium oxide, pregabalin, rosuvastatin, [START ON 07/02/2020] oxycodone, [START ON 08/01/2020] oxycodone, and [START ON 08/31/2020] oxycodone.  Pharmacotherapy (Medications Ordered): Meds ordered this encounter  Medications  . oxyCODONE (OXY IR/ROXICODONE) 5 MG immediate release tablet    Sig: Take 1 tablet (5 mg total) by mouth every 8 (eight) hours as needed for severe pain. Must last 30 days    Dispense:  90 tablet    Refill:  0    Chronic Pain: STOP Act (Not applicable) Fill 1 day early if closed on refill date. Do not fill until: 07/02/2020. To last until: 08/01/2020. Avoid benzodiazepines within 8 hours of opioids  . oxyCODONE (OXY IR/ROXICODONE) 5 MG immediate release tablet    Sig: Take 1 tablet (5 mg total) by mouth every 8 (eight) hours as needed for severe pain. Must last 30 days    Dispense:  90 tablet    Refill:  0    Chronic Pain: STOP Act (Not applicable) Fill 1 day early if closed on refill date. Do not fill until: 08/01/2020. To last until: 08/31/2020. Avoid benzodiazepines within 8 hours of opioids  . oxyCODONE (OXY IR/ROXICODONE) 5 MG immediate release tablet    Sig: Take 1 tablet (5 mg total) by mouth every 8 (eight) hours as needed for severe pain. Must last 30 days    Dispense:  90 tablet    Refill:  0    Chronic Pain: STOP Act (Not applicable) Fill 1 day early if closed on refill date. Do not fill until: 08/31/2020. To last until: 09/30/2020. Avoid benzodiazepines within 8 hours of opioids   Orders:  No orders of the defined types were placed in this encounter.  Follow-up plan:   Return in about 4 months (around 09/30/2020) for F2F encounter, MM (on eval day).      Interventional  treatment options: Planned, scheduled, and/or pending:   NOTE: The L3-4 interlaminar space is fused with calcification of the ligament.  Unable to enter space despite multiple attempts on 03/19/2020.   Considering:   Diagnostic Trigger point injections Diagnostic midline caudal ESI  Possible Racz procedure (epidural Neurolysis) Diagnostic thoracic facet block Possible thoracic facet RFA Diagnostic right-sided CESI #1  Diagnosticbilateral cervical facet block Possiblebilateral cervical facet RFA Diagnostic right IA shoulder injection Diagnostic right suprascapular NB Possibleright suprascapular RFA Diagnostic left genicular NB Possible bilateral genicular RFA   Palliative PRN treatment(s):   Palliative right T6-7 TESI #2  Palliative bilateral lumbar facet block #4  Palliative right SI block #3  Palliative right IA hip joint injections  Palliative right lumbar facet RFA #2 (last done 07/25/2019)  Palliative right SI joint RFA #2 (last done 05/03/2018)  Palliative left  lumbar facet RFA #2 (last done 09/10/2019)  Palliative right genicular NB #2       Recent Visits Date Type Provider Dept  05/07/20 Procedure visit , , MD [facility]  04/01/20 Telemedicine , , MD [facility]  03/19/20 Procedure visit , , MD [facility]  Showing recent visits within past 90 days and meeting all other requirements Today's Visits Date Type Provider Dept  06/11/20 Office Visit , , MD [facility]  Showing today's visits and meeting all other requirements Future Appointments No visits were found meeting these conditions. Showing future appointments within next 90 days and meeting all other requirements  I discussed the   assessment and treatment plan with the patient. The patient was provided an opportunity to ask questions and all were answered. The patient agreed with the plan and  demonstrated an understanding of the instructions.  Patient advised to call back or seek an in-person evaluation if the symptoms or condition worsens.  Duration of encounter: >60 minutes.  Note by: Gaspar Cola, MD Date: 06/11/2020; Time: 3:20 PM

## 2020-06-11 NOTE — Patient Instructions (Signed)
____________________________________________________________________________________________  Drug Holidays (Slow)  What is a "Drug Holiday"? Drug Holiday: is the name given to the period of time during which a patient stops taking a medication(s) for the purpose of eliminating tolerance to the drug.  Benefits . Improved effectiveness of opioids. . Decreased opioid dose needed to achieve benefits. . Improved pain with lesser dose.  What is tolerance? Tolerance: is the progressive decreased in effectiveness of a drug due to its repetitive use. With repetitive use, the body gets use to the medication and as a consequence, it loses its effectiveness. This is a common problem seen with opioid pain medications. As a result, a larger dose of the drug is needed to achieve the same effect that used to be obtained with a smaller dose.  How long should a "Drug Holiday" last? You should stay off of the pain medicine for at least 14 consecutive days. (2 weeks)  Should I stop the medicine "cold turkey"? No. You should always coordinate with your Pain Specialist so that he/she can provide you with the correct medication dose to make the transition as smoothly as possible.  How do I stop the medicine? Slowly. You will be instructed to decrease the daily amount of pills that you take by one (1) pill every seven (7) days. This is called a "slow downward taper" of your dose. For example: if you normally take four (4) pills per day, you will be asked to drop this dose to three (3) pills per day for seven (7) days, then to two (2) pills per day for seven (7) days, then to one (1) per day for seven (7) days, and at the end of those last seven (7) days, this is when the "Drug Holiday" would start.   Will I have withdrawals? By doing a "slow downward taper" like this one, it is unlikely that you will experience any significant withdrawal symptoms. Typically, what triggers withdrawals is the sudden stop of a high  dose opioid therapy. Withdrawals can usually be avoided by slowly decreasing the dose over a prolonged period of time. If you do not follow these instructions and decide to stop your medication abruptly, withdrawals may be possible.  What are withdrawals? Withdrawals: refers to the wide range of symptoms that occur after stopping or dramatically reducing opiate drugs after heavy and prolonged use. Withdrawal symptoms do not occur to patients that use low dose opioids, or those who take the medication sporadically. Contrary to benzodiazepine (example: Valium, Xanax, etc.) or alcohol withdrawals ("Delirium Tremens"), opioid withdrawals are not lethal. Withdrawals are the physical manifestation of the body getting rid of the excess receptors.  Expected Symptoms Early symptoms of withdrawal may include: . Agitation . Anxiety . Muscle aches . Increased tearing . Insomnia . Runny nose . Sweating . Yawning  Late symptoms of withdrawal may include: . Abdominal cramping . Diarrhea . Dilated pupils . Goose bumps . Nausea . Vomiting  Will I experience withdrawals? Due to the slow nature of the taper, it is very unlikely that you will experience any.  What is a slow taper? Taper: refers to the gradual decrease in dose.  (Last update: 05/27/2020) ____________________________________________________________________________________________    ____________________________________________________________________________________________  Medication Rules  Purpose: To inform patients, and their family members, of our rules and regulations.  Applies to: All patients receiving prescriptions (written or electronic).  Pharmacy of record: Pharmacy where electronic prescriptions will be sent. If written prescriptions are taken to a different pharmacy, please inform the nursing staff. The pharmacy   listed in the electronic medical record should be the one where you would like electronic prescriptions  to be sent.  Electronic prescriptions: In compliance with the [place] Strengthen Opioid Misuse Prevention (STOP) Act of 2017 (Session Law 2017-74/H243), effective November 07, 2018, all controlled substances must be electronically prescribed. Calling prescriptions to the pharmacy will cease to exist.  Prescription refills: Only during scheduled appointments. Applies to all prescriptions.  NOTE: The following applies primarily to controlled substances (Opioid* Pain Medications).   Type of encounter (visit): For patients receiving controlled substances, face-to-face visits are required. (Not an option or up to the patient.)  Patient's responsibilities: 1. Pain Pills: Bring all pain pills to every appointment (except for procedure appointments). 2. Pill Bottles: Bring pills in original pharmacy bottle. Always bring the newest bottle. Bring bottle, even if empty. 3. Medication refills: You are responsible for knowing and keeping track of what medications you take and those you need refilled. The day before your appointment: write a list of all prescriptions that need to be refilled. The day of the appointment: give the list to the admitting nurse. Prescriptions will be written only during appointments. No prescriptions will be written on procedure days. If you forget a medication: it will not be "Called in", "Faxed", or "electronically sent". You will need to get another appointment to get these prescribed. No early refills. Do not call asking to have your prescription filled early. 4. Prescription Accuracy: You are responsible for carefully inspecting your prescriptions before leaving our office. Have the discharge nurse carefully go over each prescription with you, before taking them home. Make sure that your name is accurately spelled, that your address is correct. Check the name and dose of your medication to make sure it is accurate. Check the number of pills, and the written instructions to  make sure they are clear and accurate. Make sure that you are given enough medication to last until your next medication refill appointment. 5. Taking Medication: Take medication as prescribed. When it comes to controlled substances, taking less pills or less frequently than prescribed is permitted and encouraged. Never take more pills than instructed. Never take medication more frequently than prescribed.  6. Inform other Doctors: Always inform, all of your healthcare providers, of all the medications you take. 7. Pain Medication from other Providers: You are not allowed to accept any additional pain medication from any other Doctor or Healthcare provider. There are two exceptions to this rule. (see below) In the event that you require additional pain medication, you are responsible for notifying us, as stated below. 8. Medication Agreement: You are responsible for carefully reading and following our Medication Agreement. This must be signed before receiving any prescriptions from our practice. Safely store a copy of your signed Agreement. Violations to the Agreement will result in no further prescriptions. (Additional copies of our Medication Agreement are available upon request.) 9. Laws, Rules, & Regulations: All patients are expected to follow all Federal and State Laws, Statutes, Rules, & Regulations. Ignorance of the Laws does not constitute a valid excuse.  10. Illegal drugs and Controlled Substances: The use of illegal substances (including, but not limited to marijuana and its derivatives) and/or the illegal use of any controlled substances is strictly prohibited. Violation of this rule may result in the immediate and permanent discontinuation of any and all prescriptions being written by our practice. The use of any illegal substances is prohibited. 11. Adopted CDC guidelines & recommendations: Target dosing levels will be at or   below 60 MME/day. Use of benzodiazepines** is not  recommended.  Exceptions: There are only two exceptions to the rule of not receiving pain medications from other Healthcare Providers. 1. Exception #1 (Emergencies): In the event of an emergency (i.e.: accident requiring emergency care), you are allowed to receive additional pain medication. However, you are responsible for: As soon as you are able, call our office [phone], at any time of the day or night, and leave a message stating your name, the date and nature of the emergency, and the name and dose of the medication prescribed. In the event that your call is answered by a member of our staff, make sure to document and save the date, time, and the name of the person that took your information.  2. Exception #2 (Planned Surgery): In the event that you are scheduled by another doctor or dentist to have any type of surgery or procedure, you are allowed (for a period no longer than 30 days), to receive additional pain medication, for the acute post-op pain. However, in this case, you are responsible for picking up a copy of our "Post-op Pain Management for Surgeons" handout, and giving it to your surgeon or dentist. This document is available at our office, and does not require an appointment to obtain it. Simply go to our office during business hours (Monday-Thursday from 8:00 AM to 4:00 PM) (Friday 8:00 AM to 12:00 Noon) or if you have a scheduled appointment with us, prior to your surgery, and ask for it by name. In addition, you will need to provide us with your name, name of your surgeon, type of surgery, and date of procedure or surgery.  *Opioid medications include: morphine, codeine, oxycodone, oxymorphone, hydrocodone, hydromorphone, meperidine, tramadol, tapentadol, buprenorphine, fentanyl, methadone. **Benzodiazepine medications include: diazepam (Valium), alprazolam (Xanax), clonazepam (Klonopine), lorazepam (Ativan), clorazepate (Tranxene), chlordiazepoxide (Librium), estazolam (Prosom),  oxazepam (Serax), temazepam (Restoril), triazolam (Halcion) (Last updated: 01/04/2018) ____________________________________________________________________________________________   ____________________________________________________________________________________________  Medication Recommendations and Reminders  Applies to: All patients receiving prescriptions (written and/or electronic).  Medication Rules & Regulations: These rules and regulations exist for your safety and that of others. They are not flexible and neither are we. Dismissing or ignoring them will be considered "non-compliance" with medication therapy, resulting in complete and irreversible termination of such therapy. (See document titled "Medication Rules" for more details.) In all conscience, because of safety reasons, we cannot continue providing a therapy where the patient does not follow instructions.  Pharmacy of record:   Definition: This is the pharmacy where your electronic prescriptions will be sent.   We do not endorse any particular pharmacy, however, we have experienced problems with [facility] not securing enough medication supply for the community.  We do not restrict you in your choice of pharmacy. However, once we write for your prescriptions, we will NOT be re-sending more prescriptions to fix restricted supply problems created by your pharmacy, or your insurance.   The pharmacy listed in the electronic medical record should be the one where you want electronic prescriptions to be sent.  If you choose to change pharmacy, simply notify our nursing staff.  Recommendations:  Keep all of your pain medications in a safe place, under lock and key, even if you live alone. We will NOT replace lost, stolen, or damaged medication.  After you fill your prescription, take 1 week's worth of pills and put them away in a safe place. You should keep a separate, properly labeled bottle for this purpose. The remainder    should be kept in the original bottle. Use this as your primary supply, until it runs out. Once it's gone, then you know that you have 1 week's worth of medicine, and it is time to come in for a prescription refill. If you do this correctly, it is unlikely that you will ever run out of medicine.  To make sure that the above recommendation works, it is very important that you make sure your medication refill appointments are scheduled at least 1 week before you run out of medicine. To do this in an effective manner, make sure that you do not leave the office without scheduling your next medication management appointment. Always ask the nursing staff to show you in your prescription , when your medication will be running out. Then arrange for the receptionist to get you a return appointment, at least 7 days before you run out of medicine. Do not wait until you have 1 or 2 pills left, to come in. This is very poor planning and does not take into consideration that we may need to cancel appointments due to bad weather, sickness, or emergencies affecting our staff.  DO NOT ACCEPT A "Partial Fill": If for any reason your pharmacy does not have enough pills/tablets to completely fill or refill your prescription, do not allow for a "partial fill". The law allows the pharmacy to complete that prescription within 72 hours, without requiring a new prescription. If they do not fill the rest of your prescription within those 72 hours, you will need a separate prescription to fill the remaining amount, which we will NOT provide. If the reason for the partial fill is your insurance, you will need to talk to the pharmacist about payment alternatives for the remaining tablets, but again, DO NOT ACCEPT A PARTIAL FILL, unless you can trust your pharmacist to obtain the remainder of the pills within 72 hours.  Prescription refills and/or changes in medication(s):   Prescription refills, and/or changes in dose or medication,  will be conducted only during scheduled medication management appointments. (Applies to both, written and electronic prescriptions.)  No refills on procedure days. No medication will be changed or started on procedure days. No changes, adjustments, and/or refills will be conducted on a procedure day. Doing so will interfere with the diagnostic portion of the procedure.  No phone refills. No medications will be "called into the pharmacy".  No Fax refills.  No weekend refills.  No Holliday refills.  No after hours refills.  Remember:  Business hours are:  Monday to Thursday 8:00 AM to 4:00 PM Provider's Schedule: Jeffrie Stander, MD - Appointments are:  Medication management: Monday and Wednesday 8:00 AM to 4:00 PM Procedure day: Tuesday and Thursday 7:30 AM to 4:00 PM Bilal Lateef, MD - Appointments are:  Medication management: Tuesday and Thursday 8:00 AM to 4:00 PM Procedure day: Monday and Wednesday 7:30 AM to 4:00 PM (Last update: 05/27/2020) ____________________________________________________________________________________________   ____________________________________________________________________________________________  CANNABIDIOL (AKA: CBD Oil or Pills)  Applies to: All patients receiving prescriptions of controlled substances (written and/or electronic).  General Information: Cannabidiol (CBD), a derivative of Marijuana, was discovered in 1940. It is one of some 113 identified cannabinoids in cannabis (Marijuana) plants, accounting for up to 40% of the plant's extract. As of 2018, preliminary clinical research on cannabidiol included studies of anxiety, cognition, movement disorders, and pain.  Cannabidiol is consummed in multiple ways, including inhalation of cannabis smoke or vapor, as an aerosol spray into the cheek, and by mouth. It   may be supplied as CBD oil containing CBD as the active ingredient (no added tetrahydrocannabinol (THC) or terpenes), a full-plant  CBD-dominant hemp extract oil, capsules, dried cannabis, or as a liquid solution. CBD is thought not have the same psychoactivity as THC, and may affect the actions of THC. Studies suggest that CBD may interact with different biological targets, including cannabinoid receptors and other neurotransmitter receptors. As of 2018 the mechanism of action for its biological effects has not been determined.  In the [place], cannabidiol has a limited approval by the Food and Drug Administration (FDA) for treatment of only two types of epilepsy disorders. The side effects of long-term use of the drug include somnolence, decreased appetite, diarrhea, fatigue, malaise, weakness, sleeping problems, and others.  CBD remains a Schedule I drug prohibited for any use.  Legality: Some manufacturers ship CBD products nationally, an illegal action which the FDA has not enforced in 2018, with CBD remaining the subject of an FDA investigational new drug evaluation, and is not considered legal as a dietary supplement or food ingredient as of December 2018. Federal illegality has made it difficult historically to conduct research on CBD. CBD is openly sold in head shops and health food stores in some states where such sales have not been explicitly legalized.  Warning: Because it is not FDA approved for general use or treatment of pain, it is not required to undergo the same manufacturing controls as prescription drugs.  This means that the available cannabidiol (CBD) may be contaminated with THC.  If this is the case, it will trigger a positive urine drug screen (UDS) test for cannabinoids (Marijuana).  Because a positive UDS for illicit substances is a violation of our medication agreement, your opioid analgesics (pain medicine) may be permanently discontinued. (Last update: 05/27/2020) ____________________________________________________________________________________________    

## 2020-06-18 ENCOUNTER — Other Ambulatory Visit: Payer: Self-pay

## 2020-06-18 ENCOUNTER — Encounter: Payer: Self-pay | Admitting: Surgical

## 2020-06-18 ENCOUNTER — Ambulatory Visit (INDEPENDENT_AMBULATORY_CARE_PROVIDER_SITE_OTHER): Payer: PPO | Admitting: Surgical

## 2020-06-18 VITALS — BP 104/54 | HR 99 | Temp 98.4°F

## 2020-06-18 DIAGNOSIS — Z9889 Other specified postprocedural states: Secondary | ICD-10-CM

## 2020-06-18 MED ORDER — SULFAMETHOXAZOLE-TRIMETHOPRIM 800-160 MG PO TABS
1.0000 | ORAL_TABLET | Freq: Two times a day (BID) | ORAL | 0 refills | Status: DC
Start: 2020-06-18 — End: 2020-06-22

## 2020-06-18 NOTE — Progress Notes (Signed)
Patient is a 59 year old female here for follow-up after free nipple graft bilateral breast reduction on 05/25/2020 with Dr. Arita Miss.  She reports that she has having a lot of pain in her right breast.  She also reports that they have been applying Vaseline and antibiotic ointment to bilateral NAC's.  She reports that she is not having any fevers or chills or nausea or vomiting.  She is continuing to wear the sports bra as well as wrap the breasts with Ace bandages as the current sports bra she is wearing does not provide much compression.  Chaperone present on exam On exam right breast is swollen and very tender to palpation along the right lateral incision.  She has some cellulitic changes just inferior to the right lateral incision.  No fluctuance noted.  No purulent drainage noted.  35 cc of serous fluid was aspirated from right breast.  The fluid did not appear purulent or cloudy.  Bilateral NAC's are viable, small wound noted at approximately 10:00 on the right NAC.  No other erythema or cellulitis noted.  On exam patient is well-appearing, well-nourished, not diaphoretic.  Nontoxic.  Temperature in office today 98.48F.  Prescription for Bactrim x7 days sent to pharmacy, recommend starting today.  I did outline the right breast cellulitis with a marking pen and advised patient if the erythema spread or she had any infectious symptoms to please call our office and update.  Recommend continuing with Vaseline daily to bilateral NAC's.  Recommend drinking plenty of fluids.  Continue to wrap bilateral breasts with Ace wrap over sports bra to help decrease any swelling.  Would like patient to follow-up in 1 week, discussed with patient and husband if she develops any worsening symptoms or has any concerns that I would like to see them sooner.  Patient and husband agree with this plan.  Pictures were obtained of the patient and placed in the chart with the patient's or guardian's permission.

## 2020-06-19 ENCOUNTER — Encounter: Payer: Self-pay | Admitting: Dietician

## 2020-06-21 NOTE — Progress Notes (Signed)
This patient's chart is under "My Open Charts". These are cancelled appointments that keep popping into my "In Basket" as a deficiency. See what you can do to remove them.   Thank you. 

## 2020-06-22 ENCOUNTER — Other Ambulatory Visit: Payer: Self-pay

## 2020-06-22 ENCOUNTER — Ambulatory Visit (INDEPENDENT_AMBULATORY_CARE_PROVIDER_SITE_OTHER): Payer: PPO | Admitting: Plastic Surgery

## 2020-06-22 ENCOUNTER — Encounter: Payer: Self-pay | Admitting: Plastic Surgery

## 2020-06-22 VITALS — BP 115/55 | HR 103 | Temp 99.3°F

## 2020-06-22 DIAGNOSIS — N62 Hypertrophy of breast: Secondary | ICD-10-CM

## 2020-06-22 MED ORDER — SULFAMETHOXAZOLE-TRIMETHOPRIM 800-160 MG PO TABS: 1.0000 | ORAL_TABLET | Freq: Two times a day (BID) | ORAL | 0 refills | Status: AC

## 2020-06-22 NOTE — Addendum Note (Signed)
Addended by: Allena Napoleon on: 06/22/2020 05:29 PM   Modules accepted: Orders

## 2020-06-22 NOTE — Progress Notes (Signed)
Pt presents now about 4 weeks out from bilateral breast reduction.  She has complaints of r side pain.  We've drained a seroma from that side twice getting a much smaller amount the second time.  There is also erythema spreading lateral to medial.  She has not had any fevers.  She has been on Bactrim for the past for 5 days or so.  On examination she has firm erythema and tenderness centered along the lateral aspect of the inframammary incision that has extended to about the level of the vertical limb.  The nipple grafts look fine.  The rest of the incisions are healing fine with no issues.  I prepped the lateral area with an alcohol pad and attempted a aspiration with an 18-gauge needle.  Quite a bit of purulence came out from around the incision site.  I extended the incision opening approximately 2 cm with scissors.  I was able to milk quite a bit of purulent fluid from the wound.  A culture swab was taken.  After expressing what appeared to be all the fluid I packed that with 1 inch gauze packing.  We are planning to have the husband do packing changes once or twice a day.  Ongoing extend her antibiotics by week.  I like her to continue to wear a gentle compressive dressing to promote drainage.  I explained that she has a postoperative infection and that we need to allow the fluid to drain so that healing can take place.  She has an appointment with [place] 2 days from now that we will plan to keep for now.  If she is doing much better we can always stretch this out and see of the following week.  All her questions were answered.

## 2020-06-23 ENCOUNTER — Telehealth: Payer: Self-pay

## 2020-06-23 ENCOUNTER — Other Ambulatory Visit (HOSPITAL_COMMUNITY)
Admission: RE | Admit: 2020-06-23 | Discharge: 2020-06-23 | Disposition: A | Discharge status: Home / Self Care | Payer: PPO | Source: Intra-hospital | Attending: Plastic Surgery | Admitting: Plastic Surgery

## 2020-06-23 NOTE — Addendum Note (Signed)
Addended byKeenan Bachelor on: 06/23/2020 10:58 AM   Modules accepted: Orders

## 2020-06-23 NOTE — Telephone Encounter (Signed)
Called [facility] and advise the culture for the patient that they received had [facility] on the original order. It need to be processed there at [facility] lab. Refaxed to correct order to [phone].  Tessie Eke with pick up, he called [facility]. Kathlene November had already delivered the culture to the lab dept. He will call them an make sure the culture needs to be processed there.

## 2020-06-24 ENCOUNTER — Encounter: Payer: Self-pay | Admitting: Surgical

## 2020-06-24 ENCOUNTER — Other Ambulatory Visit: Payer: Self-pay

## 2020-06-24 ENCOUNTER — Ambulatory Visit (INDEPENDENT_AMBULATORY_CARE_PROVIDER_SITE_OTHER): Payer: PPO | Admitting: Surgical

## 2020-06-24 ENCOUNTER — Telehealth: Payer: Self-pay

## 2020-06-24 VITALS — BP 137/88 | HR 92 | Temp 98.3°F

## 2020-06-24 DIAGNOSIS — N62 Hypertrophy of breast: Secondary | ICD-10-CM

## 2020-06-24 DIAGNOSIS — Z9889 Other specified postprocedural states: Secondary | ICD-10-CM

## 2020-06-24 NOTE — Progress Notes (Signed)
Patient is a 59 year old female here for follow-up after free nipple graft bilateral breast reduction with Dr. Arita Miss on 05/25/2020.  Patient was last seen on 06/22/2020 and the right breast area was aspirated using an 18-gauge needle and subsequently opened approximately 2 cm with scissors.  Patient's husband has been packing the wound with packing gauze twice daily.  Overall things seem to be improving, patient reports that she is having some depression and depressive thoughts due to the pain and current situation.  Her husband has been very helpful and they have been doing a great job with wound care.  She is otherwise doing well, has not had any fevers, nausea, vomiting.  She reports 6 out of 10 pain in the right side.  Right breast pain is greater than left breast pain.  Continues to have some drainage from the right breast wound that is fed.  Normal bowel and bladder function.  Chaperone present on exam On exam right breast redness and induration has significantly improved.  There is an area of induration surrounding the 2 cm wound that is circumferential.  The wound size overall is 2 x 2 x 4 cm. It is approximately 1 cm in total diameter around the wound.  There is no foul odor noted on exam.  She is tender to palpation in this area but the rest of the right breast is soft and there is no cellulitic changes.  There is no purulence expressed with palpation today.  Bilateral nipple grafts are healing fine without any necrosis or incisional dehiscence.  She has a small area of dehiscence on the left breast near the junction of the inframammary fold and vertical limb that is approximately 2 x 3 x 1 cm.  There is some exudate noted within the wound bed.  No peri-wound erythema or foul odor noted.  Recommend continuing with twice daily packing, discussed with husband and patient that at this time we would likely allow this to heal via secondary intention, which was explained to them thoroughly.  Continue with  Vaseline on bilateral NAC's with Vaseline.  Recommend covering both wounds with 4 x 4 gauze and ABD pads.  We will order supplies for patient through [facility].  Recommend continuing to wear sports bra and Ace wrap for compression.  Recommend continuing with antibiotic, recommend probiotics.  Discussed with husband that we are currently awaiting for cultures to result and I will call him with an update once cultures have resulted and we will change antibiotic regimen as needed.  Follow-up in 2 weeks for reevaluation.  I discussed to call us with any changes in the interim and she can definitely be seen sooner.  Patient and husband are in agreement with this plan and will call us with any changes.

## 2020-06-24 NOTE — Telephone Encounter (Signed)
Faxed [facility] orders for Jennifer Harmon: ABD pads and 4x4 guaze

## 2020-06-25 DIAGNOSIS — Z9889 Other specified postprocedural states: Secondary | ICD-10-CM | POA: Diagnosis not present

## 2020-06-26 LAB — BODY FLUID CULTURE

## 2020-07-02 ENCOUNTER — Other Ambulatory Visit: Payer: Self-pay

## 2020-07-02 ENCOUNTER — Ambulatory Visit (INDEPENDENT_AMBULATORY_CARE_PROVIDER_SITE_OTHER): Payer: PPO | Admitting: Surgical

## 2020-07-02 ENCOUNTER — Encounter: Payer: Self-pay | Admitting: Surgical

## 2020-07-02 ENCOUNTER — Ambulatory Visit: Payer: PPO | Admitting: Surgical

## 2020-07-02 VITALS — BP 129/85 | HR 95 | Temp 98.3°F

## 2020-07-02 DIAGNOSIS — Z9889 Other specified postprocedural states: Secondary | ICD-10-CM

## 2020-07-02 DIAGNOSIS — N62 Hypertrophy of breast: Secondary | ICD-10-CM

## 2020-07-02 NOTE — Progress Notes (Signed)
Patient is a 59 year old female here for follow-up after bilateral breast reduction with free nipple graft on 05/25/2020 with Dr. Arita Miss.  Last saw the patient on 06/24/2020. Patient had a culture of drainage from right breast at her last visit, this grew few staph aureus which was sensitive to Bactrim.  Patient has about 4 days left of Bactrim.  She reports that she is doing really well, she is in high spirits today and is very thankful for how things are going so far.  She is here with her husband.  They have continue to pack the right breast wound daily with packing gauze and have been applying Vaseline daily to bilateral nipple grafts.  There is no sign of any infection, seroma, hematoma today.  Drainage from right breast is not foul or malodorous.  There is no purulence expressed with palpation.  The bilateral breasts are nontender to palpation.  They are both soft.  There is no erythema or cellulitic changes noted.  Bilateral nipple grafts with returning pigmentation.  She has a small scab noted over the right NAC centrally. Incisions are all intact with the exception of the small wound at the junction of the vertical limb and inframammary fold on left breast.   Recommend continuing to take Bactrim, complete full course. Continue packing with gauze daily Apply Vaseline daily to bilateral NAC's and left breast inframammary fold wound.  Cover these areas with gauze.  Follow-up in 3 weeks for reevaluation.  I discussed with patient that if she notices any worsening symptoms after stopping the antibiotic to please call us and we can either prescribe an additional dose or evaluate her sooner.  Patient and husband in agreement with plan.  Discussed that she has restrictions of weight lifting greater than 15 pounds and itchiness activity at this time but otherwise has no restrictions.  Recommend calling with questions or concerns.

## 2020-07-07 DIAGNOSIS — I10 Essential (primary) hypertension: Secondary | ICD-10-CM | POA: Diagnosis not present

## 2020-07-07 DIAGNOSIS — K219 Gastro-esophageal reflux disease without esophagitis: Secondary | ICD-10-CM | POA: Diagnosis not present

## 2020-07-07 DIAGNOSIS — E7849 Other hyperlipidemia: Secondary | ICD-10-CM | POA: Diagnosis not present

## 2020-07-08 ENCOUNTER — Ambulatory Visit: Payer: PPO | Admitting: Surgical

## 2020-07-23 ENCOUNTER — Other Ambulatory Visit: Payer: Self-pay

## 2020-07-23 ENCOUNTER — Ambulatory Visit (INDEPENDENT_AMBULATORY_CARE_PROVIDER_SITE_OTHER): Payer: PPO | Admitting: Surgical

## 2020-07-23 ENCOUNTER — Encounter: Payer: Self-pay | Admitting: Surgical

## 2020-07-23 VITALS — BP 123/79 | HR 93 | Temp 99.1°F

## 2020-07-23 DIAGNOSIS — Z9889 Other specified postprocedural states: Secondary | ICD-10-CM

## 2020-07-23 NOTE — Progress Notes (Signed)
     Patient is a 59 year old female here for follow-up after bilateral breast reduction with free nipple graft on 05/25/2020 with Dr. Arita Miss.  She is doing really well today, she is here with her husband.  They had been packing a wound on her right breast which has completely healed at this time.  They have yet to find a well fitting sports bra to apply compression, they have continue to use Ace wraps for compression which has been helpful.  She is concerned about some excess tissue that she has on her left and right axillary region, reports this is causing some issues with her wardrobe and would like to know if there was any options for treating this area.   Chaperone present on exam On exam bilateral NAC skin grafts are overall healing well, she does have a small wound on the right NAC at approximately 11:00 that is 3 x 5 mm.  Bilateral NAC skin graft with normal returning pigmentation with the exception of some pink epithelium at the edges.  Bilateral breast incisions are intact, she does have a small wound that is less than 1 mm at the junction of the vertical limb and inframammary fold incision on the left breast.  She has excess tissue within the axilla and posterior to the axilla on exam.  Slight fluid wave noted on exam bilaterally, bilateral breasts are soft, no firmness noted. No sign of infection, no cellulitic changes, no fluctuance, purulence noted.  Right breast was aspirated using a sterile technique, no fluid was aspirated.  Patient tolerated this without any issue. There is no sign of any infection. Recommend continuing to wear compressive devices, provided patient with options for sports bras that clip in the front that may assist with compression. Recommend following up in 2 months for reevaluation. Discussed with patient that I would consult with Dr. Arita Miss in regards to options for treatment of excess tissue within her axilla.  Pictures were obtained of the patient and placed in  the chart with the patient's or guardian's permission.

## 2020-07-29 ENCOUNTER — Other Ambulatory Visit: Payer: Self-pay | Admitting: Pain Medicine

## 2020-07-29 DIAGNOSIS — G8929 Other chronic pain: Secondary | ICD-10-CM

## 2020-07-29 DIAGNOSIS — R252 Cramp and spasm: Secondary | ICD-10-CM

## 2020-07-31 ENCOUNTER — Other Ambulatory Visit: Payer: Self-pay | Admitting: Pain Medicine

## 2020-07-31 DIAGNOSIS — M62838 Other muscle spasm: Secondary | ICD-10-CM

## 2020-07-31 DIAGNOSIS — M7918 Myalgia, other site: Secondary | ICD-10-CM

## 2020-08-03 DIAGNOSIS — G4730 Sleep apnea, unspecified: Secondary | ICD-10-CM | POA: Diagnosis not present

## 2020-08-03 DIAGNOSIS — I10 Essential (primary) hypertension: Secondary | ICD-10-CM | POA: Diagnosis not present

## 2020-08-03 DIAGNOSIS — Z299 Encounter for prophylactic measures, unspecified: Secondary | ICD-10-CM | POA: Diagnosis not present

## 2020-08-03 DIAGNOSIS — F419 Anxiety disorder, unspecified: Secondary | ICD-10-CM | POA: Diagnosis not present

## 2020-08-03 DIAGNOSIS — Z6835 Body mass index (BMI) 35.0-35.9, adult: Secondary | ICD-10-CM | POA: Diagnosis not present

## 2020-08-03 DIAGNOSIS — F322 Major depressive disorder, single episode, severe without psychotic features: Secondary | ICD-10-CM | POA: Diagnosis not present

## 2020-08-04 ENCOUNTER — Telehealth: Payer: Self-pay | Admitting: Pain Medicine

## 2020-08-04 NOTE — Telephone Encounter (Signed)
Pharmacy called and they do have 2 scripts for the oxycodone but none for the Flexeril. Patient should have enough to last her until her Nov. appointment, but they let her start using the Flexeril script in March, when it was written, instead of May, as was on the script. I instructed her to call the pharmacy and see if they still have any refills on the previous script that she has not used. Call for any more issues as needed.

## 2020-08-04 NOTE — Telephone Encounter (Signed)
Patient called stating she has no pain meds or muscle relaxers. I looked in chart and it looks like she should have refills to last until her Nov. Appt. Please verify with pharmacy, they are the ones that said she has no refills. And let patient know status/

## 2020-08-14 DIAGNOSIS — Z1211 Encounter for screening for malignant neoplasm of colon: Secondary | ICD-10-CM | POA: Diagnosis not present

## 2020-08-14 DIAGNOSIS — Z1339 Encounter for screening examination for other mental health and behavioral disorders: Secondary | ICD-10-CM | POA: Diagnosis not present

## 2020-08-14 DIAGNOSIS — Z713 Dietary counseling and surveillance: Secondary | ICD-10-CM | POA: Diagnosis not present

## 2020-08-14 DIAGNOSIS — R5383 Other fatigue: Secondary | ICD-10-CM | POA: Diagnosis not present

## 2020-08-14 DIAGNOSIS — I10 Essential (primary) hypertension: Secondary | ICD-10-CM | POA: Diagnosis not present

## 2020-08-14 DIAGNOSIS — Z299 Encounter for prophylactic measures, unspecified: Secondary | ICD-10-CM | POA: Diagnosis not present

## 2020-08-14 DIAGNOSIS — Z0000 Encounter for general adult medical examination without abnormal findings: Secondary | ICD-10-CM | POA: Diagnosis not present

## 2020-08-14 DIAGNOSIS — E7800 Pure hypercholesterolemia, unspecified: Secondary | ICD-10-CM | POA: Diagnosis not present

## 2020-08-14 DIAGNOSIS — Z6834 Body mass index (BMI) 34.0-34.9, adult: Secondary | ICD-10-CM | POA: Diagnosis not present

## 2020-08-14 DIAGNOSIS — Z79899 Other long term (current) drug therapy: Secondary | ICD-10-CM | POA: Diagnosis not present

## 2020-08-14 DIAGNOSIS — Z1331 Encounter for screening for depression: Secondary | ICD-10-CM | POA: Diagnosis not present

## 2020-08-14 DIAGNOSIS — Z7189 Other specified counseling: Secondary | ICD-10-CM | POA: Diagnosis not present

## 2020-08-17 DIAGNOSIS — E7800 Pure hypercholesterolemia, unspecified: Secondary | ICD-10-CM | POA: Diagnosis not present

## 2020-08-17 DIAGNOSIS — R5383 Other fatigue: Secondary | ICD-10-CM | POA: Diagnosis not present

## 2020-08-17 DIAGNOSIS — Z79899 Other long term (current) drug therapy: Secondary | ICD-10-CM | POA: Diagnosis not present

## 2020-08-20 ENCOUNTER — Ambulatory Visit: Payer: PPO | Admitting: Pain Medicine

## 2020-08-25 NOTE — Progress Notes (Signed)
PROVIDER NOTE: Information contained herein reflects review and annotations entered in association with encounter. Interpretation of such information and data should be left to medically-trained personnel. Information provided to patient can be located elsewhere in the medical record under "Patient Instructions". Document created using STT-dictation technology, any transcriptional errors that may result from process are unintentional.    Patient: Jennifer Harmon  Service Category: E/M  Provider: Gaspar Cola, MD  DOB: 03-20-1961  DOS: 08/26/2020  Specialty: Interventional Pain Management  MRN: [id number]  Setting: Ambulatory outpatient  PCP: Glenda Chroman, MD  Type: Established Patient    Referring Provider: Glenda Chroman, MD  Location: Office  Delivery: Face-to-face     HPI  [name], a 59 y.o. year old female, is here today because of her Chronic pain syndrome [G89.4]. [name] primary complain today is Neck Pain and Leg Pain (bilateral) Last encounter: My last encounter with her was on 08/04/2020. Pertinent problems: [name] has Fibromyalgia; DDD (degenerative disc disease), lumbar; S/P partial knee replacement (Left); S/P total knee replacement (Right); Osteoarthritis of feet (Bilateral); Chronic inflammatory arthritis; History of knee arthroplasty (Bilateral); Osteoarthritis of knee (Right); Symptomatic mammary hypertrophy; Chronic upper back pain (1ry area of Pain) (Bilateral) (R>L); Chronic neck pain (3ry area of Pain) (Bilateral) (R>L); Chronic shoulder pain (Bilateral) (R>L); Chronic upper extremity pain (Bilateral) (R>L); Chronic pain syndrome; Chronic sacroiliac joint pain (Bilateral) (R>L); Chronic low back pain (2ry area of Pain) (Bilateral) (R>L); Chronic knee pain (4th area of Pain) (Bilateral) (R>L); Neurogenic pain; Chronic musculoskeletal pain; Chronic recurrent lower extremity muscle spasms (Bilateral); Thoracic radiculopathy due to osteoarthritis of spine;  Thoracic radiculitis (T6/T7) (Right); Chronic lower extremity pain (Bilateral) (R>L); Chronic lower extremity radicular pain (L5) (Right); DISH (diffuse idiopathic skeletal hyperostosis); 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; DDD (degenerative disc disease), thoracic; Failed back surgical syndrome; Fusion of lumbar spine; Lumbar facet syndrome (Bilateral) (R>L); Lumbar paraspinal muscle spasm; Spondylosis without myelopathy or radiculopathy, lumbosacral region; Chronic hip pain (Bilateral) (R>L); Other specified dorsopathies, sacral and sacrococcygeal region; Osteoarthritis of hip (Bilateral) (R>L); Thoracic spondylosis with radiculopathy; Pain due to total right knee replacement (Jennifer Harmon); Chronic knee pain after total replacement (TKR) (Right); DDD (degenerative disc disease), cervical; Cervical spondylosis with radiculopathy (Right); Night cramps; S/P Revision of total knee replacement (Right); Abnormal MRI, cervical spine; Trigger point with back pain; Acute exacerbation of chronic low back pain; Abnormal MRI, lumbar spine (02/18/2020); Lumbar central spinal stenosis, w/ neurogenic claudication (L3-4); Lumbar foraminal stenosis (Right) (L3-4); Cervicalgia; and Cervical facet syndrome (Right) on their pertinent problem list. Pain Assessment: Severity of Chronic pain is reported as a 8 /10. Location: Neck  /radiates up to right ear. Onset: More than a month ago. Quality: Aching, Shooting. Timing: Constant. Modifying factor(s): volatern gel. Vitals:  height is _0  (1.6 m) and weight is 202 lb (91.6 kg). Her temperature is 97.2 F (36.2 C) (abnormal). Her blood pressure is 135/72 and her pulse is 82. Her respiration is 18 and oxygen saturation is 100%.   Reason for encounter: medication management.  In addition the patient indicates having worsening of the neck pain.   The patient indicates doing well with the current medication regimen. No adverse reactions or side effects reported to the medications.   The patient describes the neck pain as being on the right side of the neck and referring pain to the right upper shoulder area and right upper back as well as her ear.  She describes this pain as  being in the posterior aspect of the neck.  She has decreased range of motion of the cervical spine and anytime she tries to move the neck she refers having shooting pains to the above-mentioned areas.  In addition, she refers that she still having problems with spasms of the lower back, lower extremities and thighs.  She is taking the Flexeril, the baclofen, and the magnesium.  Today I have provided her with with some additional information on things that she can do for spasms.  RTCB: 12/29/2020 Nonopioids transfer 08/26/2020: Calcium, Lyrica, Nexium, magnesium, baclofen, and Flexeril.  Pharmacotherapy Assessment   Analgesic: Oxycodone IR 5 mg, 1 tab PO q 8 hrs (15 mg/day of oxycodone) MME/day: 22.5 mg/day.   Monitoring: [place] PMP: PDMP reviewed during this encounter.       Pharmacotherapy: No side-effects or adverse reactions reported. Compliance: No problems identified. Effectiveness: Clinically acceptable.  Dewayne Shorter, RN  08/26/2020  2:56 PM  Signed Nursing Pain Medication Assessment:  Safety precautions to be maintained throughout the outpatient stay will include: orient to surroundings, keep bed in low position, maintain call bell within reach at all times, provide assistance with transfer out of bed and ambulation.  Medication Inspection Compliance: Pill count conducted under aseptic conditions, in front of the patient. Neither the pills nor the bottle was removed from the patient's sight at any time. Once count was completed pills were immediately returned to the patient in their original bottle.  Medication: Oxycodone IR Pill/Patch Count: 23 of 90 pills remain Pill/Patch Appearance: Markings consistent with prescribed medication Bottle Appearance: Standard pharmacy container. Clearly  labeled. Filled Date: 09 / 20 / 2021 Last Medication intake:  Today  Safety precautions to be maintained throughout the outpatient stay will include: orient to surroundings, keep bed in low position, maintain call bell within reach at all times, provide assistance with transfer out of bed and ambulation.    UDS:  Summary  Date Value Ref Range Status  03/05/2020 Note  Final    Comment:    ==================================================================== [employer] [facility] 13 (MW) ==================================================================== Test                             Result       Flag       Units Drug Present   7-aminoclonazepam              817                     ng/mg creat    7-aminoclonazepam is an expected metabolite of clonazepam. Source of    clonazepam is a scheduled prescription medication.   Oxycodone                      1431                    ng/mg creat   Oxymorphone                    806                     ng/mg creat   Noroxycodone                   2398                    ng/mg creat   Noroxymorphone  150                     ng/mg creat    Sources of oxycodone are scheduled prescription medications.    Oxymorphone, noroxycodone, and noroxymorphone are expected    metabolites of oxycodone. Oxymorphone is also available as a    scheduled prescription medication. ==================================================================== Test                      Result    Flag   Units      Ref Range   Creatinine              52               mg/dL      >=20 ==================================================================== Declared Medications:  Medication list was not provided. ==================================================================== For clinical consultation, please call [phone]. ====================================================================      ROS  Constitutional: Denies any fever or  chills Gastrointestinal: No reported hemesis, hematochezia, vomiting, or acute GI distress Musculoskeletal: Denies any acute onset joint swelling, redness, loss of ROM, or weakness Neurological: No reported episodes of acute onset apraxia, aphasia, dysarthria, agnosia, amnesia, paralysis, loss of coordination, or loss of consciousness  Medication Review  Magnesium Oxide, Multi-Vitamin, acetaminophen, baclofen, calcium carbonate, citalopram, clonazePAM, cyclobenzaprine, diclofenac Sodium, esomeprazole, famotidine, latanoprost, oxyCODONE, pregabalin, and rosuvastatin  History Review  Allergy: Ms. Newland is allergic to aspirin, bupropion, ibuprofen, and tape. Drug: Ms. Oetken  reports no history of drug use. Alcohol:  reports no history of alcohol use. Tobacco:  reports that she quit smoking about 4 years ago. Her smoking use included pipe and cigarettes. She has a 1.50 pack-year smoking history. She has never used smokeless tobacco. Social: Ms. Dufault  reports that she quit smoking about 4 years ago. Her smoking use included pipe and cigarettes. She has a 1.50 pack-year smoking history. She has never used smokeless tobacco. She reports that she does not drink alcohol and does not use drugs. Medical:  has a past medical history of Acute postoperative pain (05/03/2018), Anxiety, Chronic chest pain, Depression, DJD (degenerative joint disease), Fibromyalgia, GERD (gastroesophageal reflux disease), and Hypercholesterolemia. Surgical: Ms. Aguila  has a past surgical history that includes Cholecystectomy; Cesarean section; Back surgery (2008); Joint replacement (Right, 04/16/2019); and Breast reduction surgery (Bilateral, 05/25/2020). Family: family history includes Arthritis in her father; Hypertension in her mother.  Laboratory Chemistry Profile   Renal Lab Results  Component Value Date   BUN 12 11/05/2019   CREATININE 0.53 11/05/2019   GFRAA >60 11/05/2019   GFRNONAA >60 11/05/2019      Hepatic Lab Results  Component Value Date   AST 17 11/05/2019   ALT 20 11/05/2019   ALBUMIN 4.0 11/05/2019   ALKPHOS 109 11/05/2019     Electrolytes Lab Results  Component Value Date   NA 141 11/05/2019   K 3.7 11/05/2019   CL 102 11/05/2019   CALCIUM 9.3 11/05/2019   MG 2.2 11/05/2019     Bone Lab Results  Component Value Date   VD25OH 21.15 (L) 11/05/2019     Inflammation (CRP: Acute Phase) (ESR: Chronic Phase) Lab Results  Component Value Date   CRP 0.6 11/05/2019   ESRSEDRATE 21 11/05/2019       Note: Above Lab results reviewed.  Recent Imaging Review  DG PAIN [facility] C-ARM 1-60 MIN NO REPORT Fluoro was used, but no Radiologist interpretation will be provided.  Please refer to "NOTES" tab for provider progress note.  Note: Reviewed        Physical Exam  General appearance: Well nourished, well developed, and well hydrated. In no apparent acute distress Mental status: Alert, oriented x 3 (person, place, & time)       Respiratory: No evidence of acute respiratory distress Eyes: PERLA Vitals: BP 135/72   Pulse 82   Temp (!) 97.2 F (36.2 C)   Resp 18   Ht _0  (1.6 m)   Wt 202 lb (91.6 kg)   SpO2 100%   BMI 35.78 kg/m  BMI: Estimated body mass index is 35.78 kg/m as calculated from the following:   Height as of this encounter: _1  (1.6 m).   Weight as of this encounter: 202 lb (91.6 kg). Ideal: Ideal body weight: 52.4 kg (115 lb 8.3 oz) Adjusted ideal body weight: 68.1 kg (150 lb 1.8 oz)  Assessment   Status Diagnosis  Controlled Controlled Controlled 1. Chronic pain syndrome   2. Cervical facet syndrome (Right)   3. Cervicalgia   4. Chronic upper back pain (1ry area of Pain) (Bilateral) (R>L)   5. Chronic low back pain (2ry area of Pain) (Bilateral) (R>L)   6. Gastroesophageal reflux disease without esophagitis   7. Abnormal MRI, cervical spine   8. Neurogenic pain   9. Chronic musculoskeletal pain   10. Fibromyalgia   11. Night cramps    12. Muscle spasms of both lower extremities   13. Pharmacologic therapy   14. NSAID induced gastritis   15. Vitamin D insufficiency      Updated Problems: Problem  Cervical facet syndrome (Right)    Plan of Care  Problem-specific:  No problem-specific Assessment & Plan notes found for this encounter.  [name] has a current medication list which includes the following long-term medication(s): clonazepam, rosuvastatin, [START ON 09/04/2020] baclofen, [START ON 09/04/2020] calcium carbonate, [START ON 09/04/2020] cyclobenzaprine, [START ON 09/04/2020] esomeprazole, [START ON 09/04/2020] magnesium oxide, [START ON 09/30/2020] oxycodone, [START ON 10/30/2020] oxycodone, [START ON 11/29/2020] oxycodone, and [START ON 09/04/2020] pregabalin.  Pharmacotherapy (Medications Ordered): Meds ordered this encounter  Medications  . cyclobenzaprine (FLEXERIL) 10 MG tablet    Sig: Take 1 tablet (10 mg total) by mouth 3 (three) times daily.    Dispense:  90 tablet    Refill:  2    Fill one day early if pharmacy is closed on scheduled refill date. May substitute for generic if available.  . baclofen (LIORESAL) 10 MG tablet    Sig: Take 1-2 tablets (10-20 mg total) by mouth at bedtime.    Dispense:  60 tablet    Refill:  2    Fill one day early if pharmacy is closed on scheduled refill date. May substitute for generic if available.  . Magnesium Oxide 500 MG CAPS    Sig: Take 1 capsule (500 mg total) by mouth at bedtime.    Dispense:  30 capsule    Refill:  2    Fill one day early if pharmacy is closed on scheduled refill date. May substitute for generic if available.  . esomeprazole (NEXIUM) 40 MG capsule    Sig: Take 1 capsule (40 mg total) by mouth at bedtime. Take while on ibuprofen.    Dispense:  30 capsule    Refill:  2    Fill one day early if pharmacy is closed on scheduled refill date. May substitute for generic if available.  . pregabalin (LYRICA) 150 MG capsule    Sig:  Take  1 capsule (150 mg total) by mouth 3 (three) times daily.    Dispense:  90 capsule    Refill:  2    Fill one day early if pharmacy is closed on scheduled refill date. May substitute for generic if available.  . calcium carbonate (CALCIUM 600) 600 MG TABS tablet    Sig: Take 1 tablet (600 mg total) by mouth 2 (two) times daily with a meal.    Dispense:  60 tablet    Refill:  2    Fill one day early if pharmacy is closed on scheduled refill date. May substitute for generic if available.  [name] oxyCODONE (OXY IR/ROXICODONE) 5 MG immediate release tablet    Sig: Take 1 tablet (5 mg total) by mouth every 8 (eight) hours as needed for severe pain. Must last 30 days    Dispense:  90 tablet    Refill:  0    Chronic Pain: STOP Act (Not applicable) Fill 1 day early if closed on refill date. Avoid benzodiazepines within 8 hours of opioids  . oxyCODONE (OXY IR/ROXICODONE) 5 MG immediate release tablet    Sig: Take 1 tablet (5 mg total) by mouth every 8 (eight) hours as needed for severe pain. Must last 30 days    Dispense:  90 tablet    Refill:  0    Chronic Pain: STOP Act (Not applicable) Fill 1 day early if closed on refill date. Avoid benzodiazepines within 8 hours of opioids  . oxyCODONE (OXY IR/ROXICODONE) 5 MG immediate release tablet    Sig: Take 1 tablet (5 mg total) by mouth every 8 (eight) hours as needed for severe pain. Must last 30 days    Dispense:  90 tablet    Refill:  0    Chronic Pain: STOP Act (Not applicable) Fill 1 day early if closed on refill date. Avoid benzodiazepines within 8 hours of opioids   Orders:  Orders Placed This Encounter  Procedures  . CERVICAL FACET (MEDIAL BRANCH NERVE BLOCK)     Standing Status:   Future    Standing Expiration Date:   09/26/2020    Scheduling Instructions:     Side: Right-sided     Level: C3-4, C4-5, C5-6 Facet joints (C3, C4, C5, C6, & C7 Medial Branch Nerves)     Sedation: Patient's choice.     Timeframe: As soon as schedule allows     Order Specific Question:   Where will this procedure be performed?    Answer:   [facility]   Follow-up plan:   Return for Procedure (w/ sedation): (R) C-FCt Blk.      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  Unable to enter space despite multiple attempts on 03/19/2020.   Considering:   Diagnostic Trigger point injections Diagnostic midline caudal ESI  Possible Racz procedure (epidural Neurolysis) Diagnostic thoracic facet block Possible thoracic facet RFA Diagnostic right-sided CESI #1  Diagnosticbilateral cervical facet block Possiblebilateral cervical facet RFA Diagnostic right IA shoulder injection Diagnostic right suprascapular NB Possibleright suprascapular RFA Diagnostic left genicular NB Possible bilateral genicular RFA   Palliative PRN treatment(s):   Palliative right T6-7 TESI #2  Palliative bilateral lumbar facet block #4  Palliative right SI block #3  Palliative right IA hip joint injections  Palliative right lumbar facet RFA #2 (last done 07/25/2019)  Palliative right SI joint RFA #2 (last done 05/03/2018)  Palliative left  lumbar facet RFA #2 (last done 09/10/2019)  Palliative right genicular NB #2        Recent Visits Date Type Provider Dept  06/11/20 Office Visit Milinda Pointer, MD [facility]  Showing recent visits within past 90 days and meeting all other requirements Today's Visits Date Type Provider Dept  08/26/20 Office Visit Milinda Pointer, MD [facility]  Showing today's visits and meeting all other requirements Future Appointments Date Type Provider Dept  09/16/20 Appointment Milinda Pointer, MD [facility]  Showing future appointments within next 90 days and meeting all other requirements  I discussed the assessment and treatment plan with the patient. The patient was provided  an opportunity to ask questions and all were answered. The patient agreed with the plan and demonstrated an understanding of the instructions.  Patient advised to call back or seek an in-person evaluation if the symptoms or condition worsens.  Duration of encounter: 30 minutes.  Note by: Gaspar Cola, MD Date: 08/26/2020; Time: 3:11 PM

## 2020-08-26 ENCOUNTER — Other Ambulatory Visit: Payer: Self-pay

## 2020-08-26 ENCOUNTER — Ambulatory Visit: Payer: PPO | Attending: Pain Medicine | Admitting: Pain Medicine

## 2020-08-26 ENCOUNTER — Encounter: Payer: Self-pay | Admitting: Pain Medicine

## 2020-08-26 VITALS — BP 135/72 | HR 82 | Temp 97.2°F | Resp 18 | Ht 63.0 in | Wt 202.0 lb

## 2020-08-26 DIAGNOSIS — G894 Chronic pain syndrome: Secondary | ICD-10-CM | POA: Diagnosis not present

## 2020-08-26 DIAGNOSIS — E559 Vitamin D deficiency, unspecified: Secondary | ICD-10-CM | POA: Diagnosis not present

## 2020-08-26 DIAGNOSIS — G8929 Other chronic pain: Secondary | ICD-10-CM | POA: Diagnosis not present

## 2020-08-26 DIAGNOSIS — K219 Gastro-esophageal reflux disease without esophagitis: Secondary | ICD-10-CM | POA: Diagnosis not present

## 2020-08-26 DIAGNOSIS — T39395A Adverse effect of other nonsteroidal anti-inflammatory drugs [NSAID], initial encounter: Secondary | ICD-10-CM | POA: Insufficient documentation

## 2020-08-26 DIAGNOSIS — M62838 Other muscle spasm: Secondary | ICD-10-CM | POA: Diagnosis not present

## 2020-08-26 DIAGNOSIS — M549 Dorsalgia, unspecified: Secondary | ICD-10-CM | POA: Diagnosis not present

## 2020-08-26 DIAGNOSIS — R252 Cramp and spasm: Secondary | ICD-10-CM | POA: Diagnosis not present

## 2020-08-26 DIAGNOSIS — M792 Neuralgia and neuritis, unspecified: Secondary | ICD-10-CM | POA: Diagnosis not present

## 2020-08-26 DIAGNOSIS — M797 Fibromyalgia: Secondary | ICD-10-CM | POA: Diagnosis not present

## 2020-08-26 DIAGNOSIS — M5442 Lumbago with sciatica, left side: Secondary | ICD-10-CM | POA: Insufficient documentation

## 2020-08-26 DIAGNOSIS — Z79899 Other long term (current) drug therapy: Secondary | ICD-10-CM | POA: Insufficient documentation

## 2020-08-26 DIAGNOSIS — M7918 Myalgia, other site: Secondary | ICD-10-CM | POA: Insufficient documentation

## 2020-08-26 DIAGNOSIS — M47812 Spondylosis without myelopathy or radiculopathy, cervical region: Secondary | ICD-10-CM | POA: Diagnosis not present

## 2020-08-26 DIAGNOSIS — M542 Cervicalgia: Secondary | ICD-10-CM | POA: Diagnosis not present

## 2020-08-26 DIAGNOSIS — K2960 Other gastritis without bleeding: Secondary | ICD-10-CM | POA: Diagnosis not present

## 2020-08-26 DIAGNOSIS — M5441 Lumbago with sciatica, right side: Secondary | ICD-10-CM | POA: Insufficient documentation

## 2020-08-26 DIAGNOSIS — R937 Abnormal findings on diagnostic imaging of other parts of musculoskeletal system: Secondary | ICD-10-CM | POA: Diagnosis not present

## 2020-08-26 MED ORDER — CYCLOBENZAPRINE HCL 10 MG PO TABS: 10.0000 mg | ORAL_TABLET | Freq: Three times a day (TID) | ORAL | 2 refills | Status: AC

## 2020-08-26 MED ORDER — BACLOFEN 10 MG PO TABS: 10.0000 mg | ORAL_TABLET | Freq: Every day | ORAL | 2 refills | Status: DC

## 2020-08-26 MED ORDER — OXYCODONE HCL 5 MG PO TABS: 5.0000 mg | ORAL_TABLET | Freq: Three times a day (TID) | ORAL | 0 refills | Status: DC | PRN

## 2020-08-26 MED ORDER — MAGNESIUM OXIDE -MG SUPPLEMENT 500 MG PO CAPS: 1.0000 | ORAL_CAPSULE | Freq: Every day | ORAL | 2 refills | Status: AC

## 2020-08-26 MED ORDER — PREGABALIN 150 MG PO CAPS: 150.0000 mg | ORAL_CAPSULE | Freq: Three times a day (TID) | ORAL | 2 refills | Status: AC

## 2020-08-26 MED ORDER — ESOMEPRAZOLE MAGNESIUM 40 MG PO CPDR: 40.0000 mg | DELAYED_RELEASE_CAPSULE | Freq: Every day | ORAL | 2 refills | Status: AC

## 2020-08-26 MED ORDER — CALCIUM CARBONATE 600 MG PO TABS: 600.0000 mg | ORAL_TABLET | Freq: Two times a day (BID) | ORAL | 2 refills | Status: AC

## 2020-08-26 NOTE — Patient Instructions (Addendum)
____________________________________________________________________________________________  Muscle Spasms & Cramps  ____________________________________________________________________________________________  Preparing for Procedure with Sedation  Procedure appointments are limited to planned procedures: . No Prescription Refills. . No disability issues will be discussed. . No medication changes will be discussed.  Instructions: . Oral Intake: Do not eat or drink anything for at least 8 hours prior to your procedure. (Exception: Blood Pressure Medication. See below.) . Transportation: Unless otherwise stated by your physician, you may drive yourself after the procedure. . Blood Pressure Medicine: Do not forget to take your blood pressure medicine with a sip of water the morning of the procedure. If your Diastolic (lower reading)is above 100 mmHg, elective cases will be cancelled/rescheduled. . Blood thinners: These will need to be stopped for procedures. Notify our staff if you are taking any blood thinners. Depending on which one you take, there will be specific instructions on how and when to stop it. . Diabetics on insulin: Notify the staff so that you can be scheduled 1st case in the morning. If your diabetes requires high dose insulin, take only  of your normal insulin dose the morning of the procedure and notify the staff that you have done so. . Preventing infections: Shower with an antibacterial soap the morning of your procedure. . Build-up your immune system: Take 1000 mg of Vitamin C with every meal (3 times a day) the day prior to your procedure. [name] Antibiotics: Inform the staff if you have a condition or reason that requires you to take antibiotics before dental procedures. . Pregnancy: If you are pregnant, call and cancel the procedure. . Sickness: If you have a cold, fever, or any active infections, call and cancel the procedure. . Arrival: You must be in the facility at  least 30 minutes prior to your scheduled procedure. . Children: Do not bring children with you. . Dress appropriately: Bring dark clothing that you would not mind if they get stained. . Valuables: Do not bring any jewelry or valuables.  Reasons to call and reschedule or cancel your procedure: (Following these recommendations will minimize the risk of a serious complication.) . Surgeries: Avoid having procedures within 2 weeks of any surgery. (Avoid for 2 weeks before or after any surgery). . Flu Shots: Avoid having procedures within 2 weeks of a flu shots or . (Avoid for 2 weeks before or after immunizations). . Barium: Avoid having a procedure within 7-10 days after having had a radiological study involving the use of radiological contrast. (Myelograms, Barium swallow or enema study). . Heart attacks: Avoid any elective procedures or surgeries for the initial 6 months after a "Myocardial Infarction" (Heart Attack). . Blood thinners: It is imperative that you stop these medications before procedures. Let us know if you if you take any blood thinner.  . Infection: Avoid procedures during or within two weeks of an infection (including chest colds or gastrointestinal problems). Symptoms associated with infections include: Localized redness, fever, chills, night sweats or profuse sweating, burning sensation when voiding, cough, congestion, stuffiness, runny nose, sore throat, diarrhea, nausea, vomiting, cold or Flu symptoms, recent or current infections. It is specially important if the infection is over the area that we intend to treat. [name] Heart and lung problems: Symptoms that may suggest an active cardiopulmonary problem include: cough, chest pain, breathing difficulties or shortness of breath, dizziness, ankle swelling, uncontrolled high or unusually low blood pressure, and/or palpitations. If you are experiencing any of these symptoms, cancel your procedure and contact your primary care physician for an  evaluation.  Remember:  Regular Business hours are:  Monday to Thursday 8:00 AM to 4:00 PM  Provider's Schedule: Delano Metz, MD:  Procedure days: Tuesday and Thursday 7:30 AM to 4:00 PM  Edward Jolly, MD:  Procedure days: Monday and Wednesday 7:30 AM to 4:00 PM ____________________________________________________________________________________________  Cause:  The most common cause of muscle spasms and cramps is vitamin and/or electrolyte (calcium, potassium, sodium, etc.) deficiencies.  Possible triggers: Sweating - causes loss of electrolytes thru the skin. Steroids - causes loss of electrolytes thru the urine.  Treatment: 1. Gatorade (or any other electrolyte-replenishing drink) - Take 1, 8 oz glass with each meal (3 times a day). 2. OTC (over-the-counter) Magnesium 400 to 500 mg - Take 1 tablet twice a day (one with breakfast and one before bedtime). If you have kidney problems, talk to your primary care physician before taking any Magnesium. 3. Tonic Water with quinine - Take 1, 8 oz glass before bedtime.   ____________________________________________________________________________________________   ____________________________________________________________________________________________  Preparing for Procedure with Sedation  Procedure appointments are limited to planned procedures: . No Prescription Refills. . No disability issues will be discussed. . No medication changes will be discussed.  Instructions: . Oral Intake: Do not eat or drink anything for at least 8 hours prior to your procedure. (Exception: Blood Pressure Medication. See below.) . Transportation: Unless otherwise stated by your physician, you may drive yourself after the procedure. . Blood Pressure Medicine: Do not forget to take your blood pressure medicine with a sip of water the morning of the procedure. If your Diastolic (lower reading)is above 100 mmHg, elective cases will be  cancelled/rescheduled. . Blood thinners: These will need to be stopped for procedures. Notify our staff if you are taking any blood thinners. Depending on which one you take, there will be specific instructions on how and when to stop it. . Diabetics on insulin: Notify the staff so that you can be scheduled 1st case in the morning. If your diabetes requires high dose insulin, take only  of your normal insulin dose the morning of the procedure and notify the staff that you have done so. . Preventing infections: Shower with an antibacterial soap the morning of your procedure. . Build-up your immune system: Take 1000 mg of Vitamin C with every meal (3 times a day) the day prior to your procedure. [name] Antibiotics: Inform the staff if you have a condition or reason that requires you to take antibiotics before dental procedures. . Pregnancy: If you are pregnant, call and cancel the procedure. . Sickness: If you have a cold, fever, or any active infections, call and cancel the procedure. . Arrival: You must be in the facility at least 30 minutes prior to your scheduled procedure. . Children: Do not bring children with you. . Dress appropriately: Bring dark clothing that you would not mind if they get stained. . Valuables: Do not bring any jewelry or valuables.  Reasons to call and reschedule or cancel your procedure: (Following these recommendations will minimize the risk of a serious complication.) . Surgeries: Avoid having procedures within 2 weeks of any surgery. (Avoid for 2 weeks before or after any surgery). . Flu Shots: Avoid having procedures within 2 weeks of a flu shots or . (Avoid for 2 weeks before or after immunizations). . Barium: Avoid having a procedure within 7-10 days after having had a radiological study involving the use of radiological contrast. (Myelograms, Barium swallow or enema study). . Heart attacks: Avoid any elective procedures or  surgeries for the initial 6 months after a  "Myocardial Infarction" (Heart Attack). . Blood thinners: It is imperative that you stop these medications before procedures. Let us know if you if you take any blood thinner.  . Infection: Avoid procedures during or within two weeks of an infection (including chest colds or gastrointestinal problems). Symptoms associated with infections include: Localized redness, fever, chills, night sweats or profuse sweating, burning sensation when voiding, cough, congestion, stuffiness, runny nose, sore throat, diarrhea, nausea, vomiting, cold or Flu symptoms, recent or current infections. It is specially important if the infection is over the area that we intend to treat. [name] Heart and lung problems: Symptoms that may suggest an active cardiopulmonary problem include: cough, chest pain, breathing difficulties or shortness of breath, dizziness, ankle swelling, uncontrolled high or unusually low blood pressure, and/or palpitations. If you are experiencing any of these symptoms, cancel your procedure and contact your primary care physician for an evaluation.  Remember:  Regular Business hours are:  Monday to Thursday 8:00 AM to 4:00 PM  Provider's Schedule: Delano Metz, MD:  Procedure days: Tuesday and Thursday 7:30 AM to 4:00 PM  Edward Jolly, MD:  Procedure days: Monday and Wednesday 7:30 AM to 4:00 PM ____________________________________________________________________________________________

## 2020-08-26 NOTE — Progress Notes (Signed)
Nursing Pain Medication Assessment:  Safety precautions to be maintained throughout the outpatient stay will include: orient to surroundings, keep bed in low position, maintain call bell within reach at all times, provide assistance with transfer out of bed and ambulation.  Medication Inspection Compliance: Pill count conducted under aseptic conditions, in front of the patient. Neither the pills nor the bottle was removed from the patient's sight at any time. Once count was completed pills were immediately returned to the patient in their original bottle.  Medication: Oxycodone IR Pill/Patch Count: 23 of 90 pills remain Pill/Patch Appearance: Markings consistent with prescribed medication Bottle Appearance: Standard pharmacy container. Clearly labeled. Filled Date: 09 / 20 / 2021 Last Medication intake:  Today  Safety precautions to be maintained throughout the outpatient stay will include: orient to surroundings, keep bed in low position, maintain call bell within reach at all times, provide assistance with transfer out of bed and ambulation.

## 2020-09-04 ENCOUNTER — Other Ambulatory Visit: Payer: Self-pay | Admitting: Pain Medicine

## 2020-09-04 DIAGNOSIS — G8929 Other chronic pain: Secondary | ICD-10-CM

## 2020-09-04 DIAGNOSIS — K219 Gastro-esophageal reflux disease without esophagitis: Secondary | ICD-10-CM | POA: Diagnosis not present

## 2020-09-04 DIAGNOSIS — E7849 Other hyperlipidemia: Secondary | ICD-10-CM | POA: Diagnosis not present

## 2020-09-04 DIAGNOSIS — M7918 Myalgia, other site: Secondary | ICD-10-CM

## 2020-09-04 DIAGNOSIS — I10 Essential (primary) hypertension: Secondary | ICD-10-CM | POA: Diagnosis not present

## 2020-09-04 DIAGNOSIS — M62838 Other muscle spasm: Secondary | ICD-10-CM

## 2020-09-08 ENCOUNTER — Ambulatory Visit (HOSPITAL_BASED_OUTPATIENT_CLINIC_OR_DEPARTMENT_OTHER): Payer: PPO | Admitting: Pain Medicine

## 2020-09-08 ENCOUNTER — Other Ambulatory Visit: Payer: Self-pay

## 2020-09-08 ENCOUNTER — Ambulatory Visit
Admission: RE | Admit: 2020-09-08 | Discharge: 2020-09-08 | Disposition: A | Discharge status: Home / Self Care | Payer: PPO | Source: Ambulatory Visit | Attending: Pain Medicine | Admitting: Pain Medicine

## 2020-09-08 ENCOUNTER — Encounter: Payer: Self-pay | Admitting: Pain Medicine

## 2020-09-08 VITALS — BP 118/62 | HR 84 | Temp 97.3°F | Resp 13 | Ht 63.0 in | Wt 193.0 lb

## 2020-09-08 DIAGNOSIS — M542 Cervicalgia: Secondary | ICD-10-CM | POA: Insufficient documentation

## 2020-09-08 DIAGNOSIS — M47812 Spondylosis without myelopathy or radiculopathy, cervical region: Secondary | ICD-10-CM | POA: Insufficient documentation

## 2020-09-08 DIAGNOSIS — M5030 Other cervical disc degeneration, unspecified cervical region: Secondary | ICD-10-CM

## 2020-09-08 MED ORDER — ROPIVACAINE HCL 2 MG/ML IJ SOLN
INTRAMUSCULAR | Status: AC
  Filled 2020-09-08: qty 10

## 2020-09-08 MED ORDER — LIDOCAINE HCL 2 % IJ SOLN
INTRAMUSCULAR | Status: AC
  Filled 2020-09-08: qty 20

## 2020-09-08 MED ORDER — ROPIVACAINE HCL 2 MG/ML IJ SOLN
9.0000 mL | Freq: Once | INTRAMUSCULAR | Status: AC
  Administered 2020-09-08: 9 mL via PERINEURAL

## 2020-09-08 MED ORDER — DEXAMETHASONE SODIUM PHOSPHATE 10 MG/ML IJ SOLN
10.0000 mg | Freq: Once | INTRAMUSCULAR | Status: AC
  Administered 2020-09-08: 10 mg

## 2020-09-08 MED ORDER — LIDOCAINE HCL 2 % IJ SOLN
20.0000 mL | Freq: Once | INTRAMUSCULAR | Status: AC
  Administered 2020-09-08: 400 mg

## 2020-09-08 MED ORDER — FENTANYL CITRATE (PF) 100 MCG/2ML IJ SOLN
INTRAMUSCULAR | Status: AC
  Filled 2020-09-08: qty 2

## 2020-09-08 MED ORDER — DEXAMETHASONE SODIUM PHOSPHATE 10 MG/ML IJ SOLN
INTRAMUSCULAR | Status: AC
  Filled 2020-09-08: qty 1

## 2020-09-08 MED ORDER — FENTANYL CITRATE (PF) 100 MCG/2ML IJ SOLN
25.0000 ug | INTRAMUSCULAR | Status: DC | PRN
  Administered 2020-09-08: 50 ug via INTRAVENOUS

## 2020-09-08 MED ORDER — MIDAZOLAM HCL 5 MG/5ML IJ SOLN
INTRAMUSCULAR | Status: AC
  Filled 2020-09-08: qty 5

## 2020-09-08 MED ORDER — MIDAZOLAM HCL 5 MG/5ML IJ SOLN
1.0000 mg | INTRAMUSCULAR | Status: DC | PRN
  Administered 2020-09-08 (×3): 1 mg via INTRAVENOUS

## 2020-09-08 MED ORDER — LACTATED RINGERS IV SOLN
1000.0000 mL | Freq: Once | INTRAVENOUS | Status: AC
  Administered 2020-09-08: 1000 mL via INTRAVENOUS

## 2020-09-08 NOTE — Progress Notes (Signed)
Safety precautions to be maintained throughout the outpatient stay will include: orient to surroundings, keep bed in low position, maintain call bell within reach at all times, provide assistance with transfer out of bed and ambulation.  

## 2020-09-08 NOTE — Patient Instructions (Signed)

## 2020-09-08 NOTE — Progress Notes (Signed)
PROVIDER NOTE: Information contained herein reflects review and annotations entered in association with encounter. Interpretation of such information and data should be left to medically-trained personnel. Information provided to patient can be located elsewhere in the medical record under "Patient Instructions". Document created using STT-dictation technology, any transcriptional errors that may result from process are unintentional.    Patient: Jennifer Harmon  Service Category: Procedure  Provider: Oswaldo Done, MD  DOB: 1961/02/15  DOS: 09/08/2020  Location: [facility]  MRN: [id number]  Setting: Ambulatory - outpatient  Referring Provider: Ignatius Specking, MD  Type: Established Patient  Specialty: Interventional Pain Management  PCP: Ignatius Specking, MD   Primary Reason for Visit: Interventional Pain Management Treatment. CC: No chief complaint on file.  Procedure:          Anesthesia, Analgesia, Anxiolysis:  Type: Cervical Facet Medial Branch Block(s)           Primary Purpose: Diagnostic Region: Posterolateral cervical spine Level: C3, C4, C5, C6, & C7 Medial Branch Level(s). Injecting these levels blocks the C3-4, C4-5, C5-6, and C6-7 cervical facet joints. Laterality: Right  Type: Moderate (Conscious) Sedation combined with Local Anesthesia Indication(s): Analgesia and Anxiety Route: Intravenous (IV) IV Access: Secured Sedation: Meaningful verbal contact was maintained at all times during the procedure  Local Anesthetic: Lidocaine 1-2%  Position: Prone with head of the table raised to facilitate breathing.   Indications: 1. Cervical facet syndrome (Right)   2. Cervicalgia   3. DDD (degenerative disc disease), cervical   4. Spondylosis without myelopathy or radiculopathy, cervical region    Pain Score: Pre-procedure: 8 /10 Post-procedure: 0-No pain/10   Pre-op Assessment:  Ms. Winters is a 59 y.o. (year old), female patient, seen today for interventional  treatment. She  has a past surgical history that includes Cholecystectomy; Cesarean section; Back surgery (2008); Joint replacement (Right, 04/16/2019); and Breast reduction surgery (Bilateral, 05/25/2020). Ms. Wandel has a current medication list which includes the following prescription(s): acetaminophen, baclofen, calcium carbonate, citalopram, clonazepam, cyclobenzaprine, diclofenac sodium, esomeprazole, famotidine, latanoprost, magnesium oxide, multi-vitamin, [START ON 09/30/2020] oxycodone, [START ON 10/30/2020] oxycodone, [START ON 11/29/2020] oxycodone, pregabalin, and rosuvastatin, and the following Facility-Administered Medications: fentanyl and midazolam. Her primarily concern today is the No chief complaint on file.  Initial Vital Signs:  Pulse/HCG Rate: 79ECG Heart Rate: 81 Temp: (!) 97 F (36.1 C) Resp: 18 BP: 107/60 SpO2: 98 %  BMI: Estimated body mass index is 34.19 kg/m as calculated from the following:   Height as of this encounter: 5\' 3"  (1.6 m).   Weight as of this encounter: 193 lb (87.5 kg).  Risk Assessment: Allergies: Reviewed. She is allergic to aspirin, bupropion, ibuprofen, and tape.  Allergy Precautions: None required Coagulopathies: Reviewed. None identified.  Blood-thinner therapy: None at this time Active Infection(s): Reviewed. None identified. Ms. Grupe is afebrile  Site Confirmation: Ms. Sultan was asked to confirm the procedure and laterality before marking the site Procedure checklist: Completed Consent: Before the procedure and under the influence of no sedative(s), amnesic(s), or anxiolytics, the patient was informed of the treatment options, risks and possible complications. To fulfill our ethical and legal obligations, as recommended by the American Medical Association's Code of Ethics, I have informed the patient of my clinical impression; the nature and purpose of the treatment or procedure; the risks, benefits, and possible complications of the  intervention; the alternatives, including doing nothing; the risk(s) and benefit(s) of the alternative treatment(s) or procedure(s); and the risk(s) and benefit(s)  of doing nothing. The patient was provided information about the general risks and possible complications associated with the procedure. These may include, but are not limited to: failure to achieve desired goals, infection, bleeding, organ or nerve damage, allergic reactions, paralysis, and death. In addition, the patient was informed of those risks and complications associated to Spine-related procedures, such as failure to decrease pain; infection (i.e.: Meningitis, epidural or intraspinal abscess); bleeding (i.e.: epidural hematoma, subarachnoid hemorrhage, or any other type of intraspinal or peri-dural bleeding); organ or nerve damage (i.e.: Any type of peripheral nerve, nerve root, or spinal cord injury) with subsequent damage to sensory, motor, and/or autonomic systems, resulting in permanent pain, numbness, and/or weakness of one or several areas of the body; allergic reactions; (i.e.: anaphylactic reaction); and/or death. Furthermore, the patient was informed of those risks and complications associated with the medications. These include, but are not limited to: allergic reactions (i.e.: anaphylactic or anaphylactoid reaction(s)); adrenal axis suppression; blood sugar elevation that in diabetics may result in ketoacidosis or comma; water retention that in patients with history of congestive heart failure may result in shortness of breath, pulmonary edema, and decompensation with resultant heart failure; weight gain; swelling or edema; medication-induced neural toxicity; particulate matter embolism and blood vessel occlusion with resultant organ, and/or nervous system infarction; and/or aseptic necrosis of one or more joints. Finally, the patient was informed that Medicine is not an exact science; therefore, there is also the possibility of  unforeseen or unpredictable risks and/or possible complications that may result in a catastrophic outcome. The patient indicated having understood very clearly. We have given the patient no guarantees and we have made no promises. Enough time was given to the patient to ask questions, all of which were answered to the patient's satisfaction. Ms. Caicedo has indicated that she wanted to continue with the procedure. Attestation: I, the ordering provider, attest that I have discussed with the patient the benefits, risks, side-effects, alternatives, likelihood of achieving goals, and potential problems during recovery for the procedure that I have provided informed consent. Date  Time: 09/08/2020  8:00 AM  Pre-Procedure Preparation:  Monitoring: As per clinic protocol. Respiration, ETCO2, SpO2, BP, heart rate and rhythm monitor placed and checked for adequate function Safety Precautions: Patient was assessed for positional comfort and pressure points before starting the procedure. Time-out: I initiated and conducted the "Time-out" before starting the procedure, as per protocol. The patient was asked to participate by confirming the accuracy of the "Time Out" information. Verification of the correct person, site, and procedure were performed and confirmed by me, the nursing staff, and the patient. "Time-out" conducted as per Joint Commission's Universal Protocol (UP.01.01.01). Time: 0844  Description of Procedure:          Laterality: Right Level: C3, C4, C5, C6, & C7 Medial Branch Level(s). Area Prepped: Posterior Cervico-thoracic Region DuraPrep (Iodine Povacrylex [0.7% available iodine] and Isopropyl Alcohol, 74% w/w) Safety Precautions: Aspiration looking for blood return was conducted prior to all injections. At no point did we inject any substances, as a needle was being advanced. Before injecting, the patient was told to immediately notify me if she was experiencing any new onset of "ringing in the  ears, or metallic taste in the mouth". No attempts were made at seeking any paresthesias. Safe injection practices and needle disposal techniques used. Medications properly checked for expiration dates. SDV (single dose vial) medications used. After the completion of the procedure, all disposable equipment used was discarded in the proper designated medical  waste containers. Local Anesthesia: Protocol guidelines were followed. The patient was positioned over the fluoroscopy table. The area was prepped in the usual manner. The time-out was completed. The target area was identified using fluoroscopy. A 12-in long, straight, sterile hemostat was used with fluoroscopic guidance to locate the targets for each level blocked. Once located, the skin was marked with an approved surgical skin marker. Once all sites were marked, the skin (epidermis, dermis, and hypodermis), as well as deeper tissues (fat, connective tissue and muscle) were infiltrated with a small amount of a short-acting local anesthetic, loaded on a 10cc syringe with a 25G, 1.5-in  Needle. An appropriate amount of time was allowed for local anesthetics to take effect before proceeding to the next step. Local Anesthetic: Lidocaine 2.0% The unused portion of the local anesthetic was discarded in the proper designated containers. Technical explanation of process:  C3 Medial Branch Nerve Block (MBB): The target area for the C3 dorsal medial articular branch is the lateral concave waist of the articular pillar of C3. Under fluoroscopic guidance, a Quincke needle was inserted until contact was made with os over the postero-lateral aspect of the articular pillar of C3 (target area). After negative aspiration for blood, 0.5 mL of the nerve block solution was injected without difficulty or complication. The needle was removed intact. C4 Medial Branch Nerve Block (MBB): The target area for the C4 dorsal medial articular branch is the lateral concave waist of the  articular pillar of C4. Under fluoroscopic guidance, a Quincke needle was inserted until contact was made with os over the postero-lateral aspect of the articular pillar of C4 (target area). After negative aspiration for blood, 0.5 mL of the nerve block solution was injected without difficulty or complication. The needle was removed intact. C5 Medial Branch Nerve Block (MBB): The target area for the C5 dorsal medial articular branch is the lateral concave waist of the articular pillar of C5. Under fluoroscopic guidance, a Quincke needle was inserted until contact was made with os over the postero-lateral aspect of the articular pillar of C5 (target area). After negative aspiration for blood, 0.5 mL of the nerve block solution was injected without difficulty or complication. The needle was removed intact. C6 Medial Branch Nerve Block (MBB): The target area for the C6 dorsal medial articular branch is the lateral concave waist of the articular pillar of C6. Under fluoroscopic guidance, a Quincke needle was inserted until contact was made with os over the postero-lateral aspect of the articular pillar of C6 (target area). After negative aspiration for blood, 0.5 mL of the nerve block solution was injected without difficulty or complication. The needle was removed intact. C7 Medial Branch Nerve Block (MBB): The target for the C7 dorsal medial articular branch lies on the superior-medial tip of the C7 transverse process. Under fluoroscopic guidance, a Quincke needle was inserted until contact was made with os over the postero-lateral aspect of the articular pillar of C7 (target area). After negative aspiration for blood, 0.5 mL of the nerve block solution was injected without difficulty or complication. The needle was removed intact. Procedural Needles: 22-gauge, 3.5-inch, Quincke needles used for all levels. Nerve block solution: 0.2% PF-Ropivacaine + Triamcinolone (40 mg/mL) diluted to a final concentration of 4  mg of Triamcinolone/mL of Ropivacaine The unused portion of the solution was discarded in the proper designated containers.  Once the entire procedure was completed, the treated area was cleaned, making sure to leave some of the prepping solution back to take advantage  of its long term bactericidal properties.  Anatomy Reference Guide:       Vitals:   09/08/20 0855 09/08/20 0905 09/08/20 0915 09/08/20 0925  BP: 138/78 116/70 118/64 118/62  Pulse: 84     Resp: Temp:  (!) 97.4 F (36.3 C)  (!) 97.3 F (36.3 C)  TempSrc:      SpO2: 98% 98% 95% 96%  Weight:      Height:        Start Time: 0844 hrs. End Time: 0854 hrs.  Imaging Guidance (Spinal):          Type of Imaging Technique: Fluoroscopy Guidance (Spinal) Indication(s): Assistance in needle guidance and placement for procedures requiring needle placement in or near specific anatomical locations not easily accessible without such assistance. Exposure Time: Please see nurses notes. Contrast: None used. Fluoroscopic Guidance: I was personally present during the use of fluoroscopy. "Tunnel Vision Technique" used to obtain the best possible view of the target area. Parallax error corrected before commencing the procedure. "Direction-depth-direction" technique used to introduce the needle under continuous pulsed fluoroscopy. Once target was reached, antero-posterior, oblique, and lateral fluoroscopic projection used confirm needle placement in all planes. Images permanently stored in EMR. Interpretation: No contrast injected. I personally interpreted the imaging intraoperatively. Adequate needle placement confirmed in multiple planes. Permanent images saved into the patient's record.  Antibiotic Prophylaxis:   Anti-infectives (From admission, onward)   None     Indication(s): None identified  Post-operative Assessment:  Post-procedure Vital Signs:  Pulse/HCG Rate: 8484 Temp: (!) 97.3 F (36.3 C) Resp: 13 BP:  118/62 SpO2: 96 %  EBL: None  Complications: No immediate post-treatment complications observed by team, or reported by patient.  Note: The patient tolerated the entire procedure well. A repeat set of vitals were taken after the procedure and the patient was kept under observation following institutional policy, for this type of procedure. Post-procedural neurological assessment was performed, showing return to baseline, prior to discharge. The patient was provided with post-procedure discharge instructions, including a section on how to identify potential problems. Should any problems arise concerning this procedure, the patient was given instructions to immediately contact us, at any time, without hesitation. In any case, we plan to contact the patient by telephone for a follow-up status report regarding this interventional procedure.  Comments:  No additional relevant information.  Plan of Care  Orders:  Orders Placed This Encounter  Procedures  . CERVICAL FACET (MEDIAL BRANCH NERVE BLOCK)     Scheduling Instructions:     Side: Right-sided     Level: C3-4, C4-5, C5-6 Facet joints (C3, C4, C5, C6, & C7 Medial Branch Nerves)     Sedation: Patient's choice.     Timeframe: Today    Order Specific Question:   Where will this procedure be performed?    Answer:   [facility]  . DG PA[facility] C-ARM 1-60 MIN NO REPORT    Intraoperative interpretation by procedural physician at [facility].    Standing Status:   Standing    Number of Occurrences:   1    Order Specific Question:   Reason for exam:    Answer:   Assistance in needle guidance and placement for procedures requiring needle placement in or near specific anatomical locations not easily accessible without such assistance.  . Informed Consent Details: Physician/Practitioner Attestation; Transcribe to consent form and obtain patient signature    Note: Always confirm laterality of pain with Ms.  Hurston, before  procedure. Transcribe to consent form and obtain patient signature.    Order Specific Question:   Physician/Practitioner attestation of informed consent for procedure/surgical case    Answer:   I, the physician/practitioner, attest that I have discussed with the patient the benefits, risks, side effects, alternatives, likelihood of achieving goals and potential problems during recovery for the procedure that I have provided informed consent.    Order Specific Question:   Procedure    Answer:   Right Cervical facet block under fluoroscopic guidance.    Order Specific Question:   Physician/Practitioner performing the procedure    Answer:   Kendric Sindelar A. Laban Emperor, MD    Order Specific Question:   Indication/Reason    Answer:   Chronic neck pain secondary to cervical facet syndrome  . Provide equipment / supplies at bedside    "Block Tray" (Disposable  single use) Needle type: SpinalSpinal Amount/quantity: 5 Size: Regular (3-3.5-inch) Gauge: 22G    Standing Status:   Standing    Number of Occurrences:   1    Order Specific Question:   Specify    Answer:   Block Tray   Chronic Opioid Analgesic:  Oxycodone IR 5 mg, 1 tab PO q 8 hrs (15 mg/day of oxycodone) MME/day: 22.5 mg/day.   Medications ordered for procedure: Meds ordered this encounter  Medications  . lidocaine (XYLOCAINE) 2 % (with pres) injection 400 mg  . lactated ringers infusion 1,000 mL  . midazolam (VERSED) 5 MG/5ML injection 1-2 mg    Make sure Flumazenil is available in the pyxis when using this medication. If oversedation occurs, administer 0.2 mg IV over 15 sec. If after 45 sec no response, administer 0.2 mg again over 1 min; may repeat at 1 min intervals; not to exceed 4 doses (1 mg)  . fentaNYL (SUBLIMAZE) injection 25-50 mcg    Make sure Narcan is available in the pyxis when using this medication. In the event of respiratory depression (RR< 8/min): Titrate NARCAN (naloxone) in increments of 0.1 to 0.2 mg IV at 2-3 minute  intervals, until desired degree of reversal.  . ropivacaine (PF) 2 mg/mL (0.2%) (NAROPIN) injection 9 mL  . dexamethasone (DECADRON) injection 10 mg   Medications administered: We administered lidocaine, lactated ringers, midazolam, fentaNYL, ropivacaine (PF) 2 mg/mL (0.2%), and dexamethasone.  See the medical record for exact dosing, route, and time of administration.  Follow-up plan:   Return in about 2 weeks (around 09/22/2020) for (VV), (PP) Follow-up.       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  Unable to enter space despite multiple attempts on 03/19/2020.   Considering:   Diagnostic Trigger point injections Diagnostic midline caudal ESI  Possible Racz procedure (epidural Neurolysis) Diagnostic thoracic facet block Possible thoracic facet RFA Diagnostic right-sided CESI #1  Diagnosticbilateral cervical facet block Possiblebilateral cervical facet RFA Diagnostic right IA shoulder injection Diagnostic right suprascapular NB Possibleright suprascapular RFA Diagnostic left genicular NB Possible bilateral genicular RFA   Palliative PRN treatment(s):   Palliative right T6-7 TESI #2  Palliative bilateral lumbar facet block #4  Palliative right SI block #3  Palliative right IA hip joint injections  Palliative right lumbar facet RFA #2 (last done 07/25/2019)  Palliative right SI joint RFA #2 (last done 05/03/2018)  Palliative left  lumbar facet RFA #2 (last done 09/10/2019)  Palliative right genicular NB #2  Recent Visits Date Type Provider Dept  08/26/20 Office Visit Delano Metz, MD [facility]  06/11/20 Office Visit Delano Metz, MD [facility]  Showing recent visits within past 90 days and meeting all other requirements Today's Visits Date Type Provider Dept  09/08/20 Procedure visit Delano Metz, MD  [facility]  Showing today's visits and meeting all other requirements Future Appointments Date Type Provider Dept  09/23/20 Appointment Delano Metz, MD [facility]  Showing future appointments within next 90 days and meeting all other requirements  Disposition: Discharge home  Discharge (Date  Time): 09/08/2020; 0925 hrs.   Primary Care Physician: Ignatius Specking, MD Location: [facility] Note by: Oswaldo Done, MD Date: 09/08/2020; Time: 9:41 AM  Disclaimer:  Medicine is not an Visual merchandiser. The only guarantee in medicine is that nothing is guaranteed. It is important to note that the decision to proceed with this intervention was based on the information collected from the patient. The Data and conclusions were drawn from the patient's questionnaire, the interview, and the physical examination. Because the information was provided in large part by the patient, it cannot be guaranteed that it has not been purposely or unconsciously manipulated. Every effort has been made to obtain as much relevant data as possible for this evaluation. It is important to note that the conclusions that lead to this procedure are derived in large part from the available data. Always take into account that the treatment will also be dependent on availability of resources and existing treatment guidelines, considered by other Pain Management Practitioners as being common knowledge and practice, at the time of the intervention. For Medico-Legal purposes, it is also important to point out that variation in procedural techniques and pharmacological choices are the acceptable norm. The indications, contraindications, technique, and results of the above procedure should only be interpreted and judged by a Board-Certified Interventional Pain Specialist with extensive familiarity and expertise in the same exact procedure and technique.

## 2020-09-09 ENCOUNTER — Telehealth: Payer: Self-pay

## 2020-09-09 NOTE — Telephone Encounter (Signed)
Post procedure follow up.  LM 

## 2020-09-16 ENCOUNTER — Encounter: Payer: PPO | Admitting: Pain Medicine

## 2020-09-17 ENCOUNTER — Inpatient Hospital Stay (HOSPITAL_COMMUNITY)
Admission: EM | Admit: 2020-09-17 | Discharge: 2020-09-19 | DRG: 918 | Disposition: A | Discharge status: Home / Self Care | Payer: PPO | Attending: Internal Medicine | Admitting: Internal Medicine

## 2020-09-17 ENCOUNTER — Other Ambulatory Visit: Payer: Self-pay

## 2020-09-17 ENCOUNTER — Encounter (HOSPITAL_COMMUNITY): Payer: Self-pay

## 2020-09-17 ENCOUNTER — Emergency Department (HOSPITAL_COMMUNITY): Payer: PPO

## 2020-09-17 DIAGNOSIS — Z96653 Presence of artificial knee joint, bilateral: Secondary | ICD-10-CM | POA: Diagnosis not present

## 2020-09-17 DIAGNOSIS — T50901A Poisoning by unspecified drugs, medicaments and biological substances, accidental (unintentional), initial encounter: Secondary | ICD-10-CM | POA: Diagnosis present

## 2020-09-17 DIAGNOSIS — K219 Gastro-esophageal reflux disease without esophagitis: Secondary | ICD-10-CM | POA: Diagnosis present

## 2020-09-17 DIAGNOSIS — Z8249 Family history of ischemic heart disease and other diseases of the circulatory system: Secondary | ICD-10-CM

## 2020-09-17 DIAGNOSIS — Z981 Arthrodesis status: Secondary | ICD-10-CM | POA: Diagnosis not present

## 2020-09-17 DIAGNOSIS — Z888 Allergy status to other drugs, medicaments and biological substances status: Secondary | ICD-10-CM | POA: Diagnosis not present

## 2020-09-17 DIAGNOSIS — S0990XA Unspecified injury of head, initial encounter: Secondary | ICD-10-CM | POA: Diagnosis not present

## 2020-09-17 DIAGNOSIS — E7800 Pure hypercholesterolemia, unspecified: Secondary | ICD-10-CM | POA: Diagnosis not present

## 2020-09-17 DIAGNOSIS — G894 Chronic pain syndrome: Secondary | ICD-10-CM | POA: Diagnosis not present

## 2020-09-17 DIAGNOSIS — M1990 Unspecified osteoarthritis, unspecified site: Secondary | ICD-10-CM | POA: Diagnosis not present

## 2020-09-17 DIAGNOSIS — Z23 Encounter for immunization: Secondary | ICD-10-CM | POA: Diagnosis not present

## 2020-09-17 DIAGNOSIS — K5900 Constipation, unspecified: Secondary | ICD-10-CM | POA: Diagnosis not present

## 2020-09-17 DIAGNOSIS — Z8673 Personal history of transient ischemic attack (TIA), and cerebral infarction without residual deficits: Secondary | ICD-10-CM | POA: Diagnosis not present

## 2020-09-17 DIAGNOSIS — Z87891 Personal history of nicotine dependence: Secondary | ICD-10-CM | POA: Diagnosis not present

## 2020-09-17 DIAGNOSIS — T50904A Poisoning by unspecified drugs, medicaments and biological substances, undetermined, initial encounter: Secondary | ICD-10-CM

## 2020-09-17 DIAGNOSIS — W19XXXA Unspecified fall, initial encounter: Secondary | ICD-10-CM | POA: Diagnosis not present

## 2020-09-17 DIAGNOSIS — E785 Hyperlipidemia, unspecified: Secondary | ICD-10-CM | POA: Diagnosis not present

## 2020-09-17 DIAGNOSIS — M160 Bilateral primary osteoarthritis of hip: Secondary | ICD-10-CM | POA: Diagnosis not present

## 2020-09-17 DIAGNOSIS — E559 Vitamin D deficiency, unspecified: Secondary | ICD-10-CM | POA: Diagnosis not present

## 2020-09-17 DIAGNOSIS — T50911A Poisoning by multiple unspecified drugs, medicaments and biological substances, accidental (unintentional), initial encounter: Secondary | ICD-10-CM | POA: Diagnosis not present

## 2020-09-17 DIAGNOSIS — M797 Fibromyalgia: Secondary | ICD-10-CM | POA: Diagnosis present

## 2020-09-17 DIAGNOSIS — M4814 Ankylosing hyperostosis [Forestier], thoracic region: Secondary | ICD-10-CM | POA: Diagnosis present

## 2020-09-17 DIAGNOSIS — R55 Syncope and collapse: Secondary | ICD-10-CM

## 2020-09-17 DIAGNOSIS — R404 Transient alteration of awareness: Secondary | ICD-10-CM | POA: Diagnosis not present

## 2020-09-17 DIAGNOSIS — Z8261 Family history of arthritis: Secondary | ICD-10-CM | POA: Diagnosis not present

## 2020-09-17 DIAGNOSIS — F4321 Adjustment disorder with depressed mood: Secondary | ICD-10-CM | POA: Diagnosis not present

## 2020-09-17 DIAGNOSIS — Z20822 Contact with and (suspected) exposure to covid-19: Secondary | ICD-10-CM | POA: Diagnosis not present

## 2020-09-17 DIAGNOSIS — T424X4A Poisoning by benzodiazepines, undetermined, initial encounter: Secondary | ICD-10-CM | POA: Diagnosis not present

## 2020-09-17 DIAGNOSIS — Z79899 Other long term (current) drug therapy: Secondary | ICD-10-CM | POA: Diagnosis not present

## 2020-09-17 DIAGNOSIS — R0902 Hypoxemia: Secondary | ICD-10-CM | POA: Diagnosis not present

## 2020-09-17 DIAGNOSIS — F419 Anxiety disorder, unspecified: Secondary | ICD-10-CM | POA: Diagnosis present

## 2020-09-17 DIAGNOSIS — Z886 Allergy status to analgesic agent status: Secondary | ICD-10-CM

## 2020-09-17 DIAGNOSIS — Z8659 Personal history of other mental and behavioral disorders: Secondary | ICD-10-CM

## 2020-09-17 LAB — BASIC METABOLIC PANEL
Anion gap: 11 (ref 5–15)
BUN: 16 mg/dL (ref 6–20)
CO2: 27 mmol/L (ref 22–32)
Calcium: 9.6 mg/dL (ref 8.9–10.3)
Chloride: 103 mmol/L (ref 98–111)
Creatinine, Ser: 0.64 mg/dL (ref 0.44–1.00)
GFR, Estimated: >60 mL/min (ref >60)
Glucose, Bld: 136 mg/dL — ABNORMAL HIGH (ref 70–99)
Potassium: 4.1 mmol/L (ref 3.5–5.1)
Sodium: 141 mmol/L (ref 135–145)

## 2020-09-17 LAB — CBC
HCT: 42.1 % (ref 36.0–46.0)
Hemoglobin: 13.3 g/dL (ref 12.0–15.0)
MCH: 28.1 pg (ref 26.0–34.0)
MCHC: 31.6 g/dL (ref 30.0–36.0)
MCV: 89 fL (ref 80.0–100.0)
Platelets: 278 10*3/uL (ref 150–400)
RBC: 4.73 MIL/uL (ref 3.87–5.11)
RDW: 13.4 % (ref 11.5–15.5)
WBC: 11 10*3/uL — ABNORMAL HIGH (ref 4.0–10.5)
nRBC: 0 % (ref 0.0–0.2)

## 2020-09-17 LAB — CBG MONITORING, ED: Glucose-Capillary: 132 mg/dL — ABNORMAL HIGH (ref 70–99)

## 2020-09-17 NOTE — ED Triage Notes (Signed)
Pt BIB EMS from daughters house. Pt had syncopal episode and hit her head on the stove. Denies blood thinners. Pt also endorses chronic back pain. A&O x4.   CBG 181 111/68 20G Bicep

## 2020-09-18 ENCOUNTER — Encounter (HOSPITAL_COMMUNITY): Payer: Self-pay

## 2020-09-18 ENCOUNTER — Emergency Department (HOSPITAL_COMMUNITY): Payer: PPO

## 2020-09-18 ENCOUNTER — Inpatient Hospital Stay (HOSPITAL_COMMUNITY): Payer: PPO

## 2020-09-18 DIAGNOSIS — M4814 Ankylosing hyperostosis [Forestier], thoracic region: Secondary | ICD-10-CM | POA: Diagnosis present

## 2020-09-18 DIAGNOSIS — E559 Vitamin D deficiency, unspecified: Secondary | ICD-10-CM | POA: Diagnosis present

## 2020-09-18 DIAGNOSIS — Z23 Encounter for immunization: Secondary | ICD-10-CM | POA: Diagnosis present

## 2020-09-18 DIAGNOSIS — G894 Chronic pain syndrome: Secondary | ICD-10-CM | POA: Diagnosis present

## 2020-09-18 DIAGNOSIS — E7800 Pure hypercholesterolemia, unspecified: Secondary | ICD-10-CM | POA: Diagnosis present

## 2020-09-18 DIAGNOSIS — Z79899 Other long term (current) drug therapy: Secondary | ICD-10-CM | POA: Diagnosis not present

## 2020-09-18 DIAGNOSIS — T50901A Poisoning by unspecified drugs, medicaments and biological substances, accidental (unintentional), initial encounter: Secondary | ICD-10-CM | POA: Diagnosis not present

## 2020-09-18 DIAGNOSIS — Z20822 Contact with and (suspected) exposure to covid-19: Secondary | ICD-10-CM | POA: Diagnosis present

## 2020-09-18 DIAGNOSIS — M797 Fibromyalgia: Secondary | ICD-10-CM | POA: Diagnosis present

## 2020-09-18 DIAGNOSIS — F4321 Adjustment disorder with depressed mood: Secondary | ICD-10-CM | POA: Diagnosis not present

## 2020-09-18 DIAGNOSIS — K219 Gastro-esophageal reflux disease without esophagitis: Secondary | ICD-10-CM | POA: Diagnosis present

## 2020-09-18 DIAGNOSIS — R55 Syncope and collapse: Secondary | ICD-10-CM | POA: Diagnosis present

## 2020-09-18 DIAGNOSIS — E785 Hyperlipidemia, unspecified: Secondary | ICD-10-CM | POA: Diagnosis present

## 2020-09-18 DIAGNOSIS — Z8673 Personal history of transient ischemic attack (TIA), and cerebral infarction without residual deficits: Secondary | ICD-10-CM | POA: Diagnosis not present

## 2020-09-18 DIAGNOSIS — M1990 Unspecified osteoarthritis, unspecified site: Secondary | ICD-10-CM | POA: Diagnosis present

## 2020-09-18 DIAGNOSIS — Z981 Arthrodesis status: Secondary | ICD-10-CM | POA: Diagnosis not present

## 2020-09-18 DIAGNOSIS — Z888 Allergy status to other drugs, medicaments and biological substances status: Secondary | ICD-10-CM | POA: Diagnosis not present

## 2020-09-18 DIAGNOSIS — T50911A Poisoning by multiple unspecified drugs, medicaments and biological substances, accidental (unintentional), initial encounter: Secondary | ICD-10-CM | POA: Diagnosis present

## 2020-09-18 DIAGNOSIS — Z8261 Family history of arthritis: Secondary | ICD-10-CM | POA: Diagnosis not present

## 2020-09-18 DIAGNOSIS — Z96653 Presence of artificial knee joint, bilateral: Secondary | ICD-10-CM | POA: Diagnosis present

## 2020-09-18 DIAGNOSIS — F419 Anxiety disorder, unspecified: Secondary | ICD-10-CM | POA: Diagnosis present

## 2020-09-18 DIAGNOSIS — Z886 Allergy status to analgesic agent status: Secondary | ICD-10-CM | POA: Diagnosis not present

## 2020-09-18 DIAGNOSIS — Z8249 Family history of ischemic heart disease and other diseases of the circulatory system: Secondary | ICD-10-CM | POA: Diagnosis not present

## 2020-09-18 DIAGNOSIS — Z87891 Personal history of nicotine dependence: Secondary | ICD-10-CM | POA: Diagnosis not present

## 2020-09-18 LAB — URINALYSIS, ROUTINE W REFLEX MICROSCOPIC
Bacteria, UA: NONE SEEN
Bilirubin Urine: NEGATIVE
Glucose, UA: NEGATIVE mg/dL
Hgb urine dipstick: NEGATIVE
Ketones, ur: NEGATIVE mg/dL
Leukocytes,Ua: NEGATIVE
Nitrite: NEGATIVE
Protein, ur: 30 mg/dL — AB
Specific Gravity, Urine: 1.026 (ref 1.005–1.030)
pH: 5 (ref 5.0–8.0)

## 2020-09-18 LAB — TROPONIN I (HIGH SENSITIVITY)
Troponin I (High Sensitivity): <2 ng/L (ref <18)
Troponin I (High Sensitivity): <2 ng/L (ref <18)

## 2020-09-18 LAB — RAPID URINE DRUG SCREEN, HOSP PERFORMED
Amphetamines: NOT DETECTED
Barbiturates: NOT DETECTED
Benzodiazepines: POSITIVE — AB
Cocaine: NOT DETECTED
Opiates: POSITIVE — AB
Tetrahydrocannabinol: NOT DETECTED

## 2020-09-18 LAB — CBC
HCT: 38.9 % (ref 36.0–46.0)
Hemoglobin: 12.3 g/dL (ref 12.0–15.0)
MCH: 27.8 pg (ref 26.0–34.0)
MCHC: 31.6 g/dL (ref 30.0–36.0)
MCV: 88 fL (ref 80.0–100.0)
Platelets: 267 10*3/uL (ref 150–400)
RBC: 4.42 MIL/uL (ref 3.87–5.11)
RDW: 13.6 % (ref 11.5–15.5)
WBC: 8.4 10*3/uL (ref 4.0–10.5)
nRBC: 0 % (ref 0.0–0.2)

## 2020-09-18 LAB — BLOOD GAS, ARTERIAL
Acid-Base Excess: 3.4 mmol/L — ABNORMAL HIGH (ref 0.0–2.0)
Bicarbonate: 28.5 mmol/L — ABNORMAL HIGH (ref 20.0–28.0)
FIO2: 21
O2 Saturation: 96.1 %
Patient temperature: 97.8
pCO2 arterial: 46.8 mmHg (ref 32.0–48.0)
pH, Arterial: 7.4 (ref 7.350–7.450)
pO2, Arterial: 80.5 mmHg — ABNORMAL LOW (ref 83.0–108.0)

## 2020-09-18 LAB — HIV ANTIBODY (ROUTINE TESTING W REFLEX): HIV Screen 4th Generation wRfx: NONREACTIVE

## 2020-09-18 LAB — PHOSPHORUS: Phosphorus: 3.3 mg/dL (ref 2.5–4.6)

## 2020-09-18 LAB — BASIC METABOLIC PANEL
Anion gap: 9 (ref 5–15)
BUN: 14 mg/dL (ref 6–20)
CO2: 25 mmol/L (ref 22–32)
Calcium: 9.3 mg/dL (ref 8.9–10.3)
Chloride: 109 mmol/L (ref 98–111)
Creatinine, Ser: 0.52 mg/dL (ref 0.44–1.00)
GFR, Estimated: >60 mL/min (ref >60)
Glucose, Bld: 153 mg/dL — ABNORMAL HIGH (ref 70–99)
Potassium: 4 mmol/L (ref 3.5–5.1)
Sodium: 143 mmol/L (ref 135–145)

## 2020-09-18 LAB — MRSA PCR SCREENING: MRSA by PCR: POSITIVE — AB

## 2020-09-18 LAB — ETHANOL: Alcohol, Ethyl (B): <10 mg/dL (ref <10)

## 2020-09-18 LAB — SALICYLATE LEVEL: Salicylate Lvl: <7 mg/dL — ABNORMAL LOW (ref 7.0–30.0)

## 2020-09-18 LAB — RESPIRATORY PANEL BY RT PCR (FLU A&B, COVID)
Influenza A by PCR: NEGATIVE
Influenza B by PCR: NEGATIVE
SARS Coronavirus 2 by RT PCR: NEGATIVE

## 2020-09-18 LAB — ACETAMINOPHEN LEVEL: Acetaminophen (Tylenol), Serum: <10 ug/mL — ABNORMAL LOW (ref 10–30)

## 2020-09-18 LAB — MAGNESIUM: Magnesium: 2.1 mg/dL (ref 1.7–2.4)

## 2020-09-18 MED ORDER — ENOXAPARIN SODIUM 40 MG/0.4ML ~~LOC~~ SOLN
40.0000 mg | SUBCUTANEOUS | Status: DC
  Administered 2020-09-18 – 2020-09-19 (×2): 40 mg via SUBCUTANEOUS
  Filled 2020-09-18 (×2): qty 0.4

## 2020-09-18 MED ORDER — CHLORHEXIDINE GLUCONATE CLOTH 2 % EX PADS
6.0000 | MEDICATED_PAD | Freq: Every day | CUTANEOUS | Status: DC
  Administered 2020-09-18: 6 via TOPICAL

## 2020-09-18 MED ORDER — MUPIROCIN 2 % EX OINT
1.0000 "application " | TOPICAL_OINTMENT | Freq: Two times a day (BID) | CUTANEOUS | Status: DC
  Administered 2020-09-18 – 2020-09-19 (×2): 1 via NASAL
  Filled 2020-09-18 (×2): qty 22

## 2020-09-18 MED ORDER — NALOXONE HCL 4 MG/10ML IJ SOLN
1.0000 mg/h | INTRAVENOUS | Status: DC
  Administered 2020-09-18 (×3): 1 mg/h via INTRAVENOUS
  Administered 2020-09-18: 0.32 mg/h via INTRAVENOUS
  Administered 2020-09-18 – 2020-09-19 (×3): 1 mg/h via INTRAVENOUS
  Filled 2020-09-18 (×12): qty 10

## 2020-09-18 MED ORDER — DICLOFENAC SODIUM 1 % EX GEL
2.0000 g | Freq: Three times a day (TID) | CUTANEOUS | Status: DC | PRN
  Administered 2020-09-18 – 2020-09-19 (×2): 2 g via TOPICAL
  Filled 2020-09-18: qty 100

## 2020-09-18 MED ORDER — NALOXONE HCL 2 MG/2ML IJ SOSY
PREFILLED_SYRINGE | INTRAMUSCULAR | Status: AC
  Administered 2020-09-18: 1 mg via INTRAVENOUS
  Filled 2020-09-18: qty 2

## 2020-09-18 MED ORDER — CHLORHEXIDINE GLUCONATE CLOTH 2 % EX PADS
6.0000 | MEDICATED_PAD | Freq: Every day | CUTANEOUS | Status: DC
  Administered 2020-09-19: 6 via TOPICAL

## 2020-09-18 MED ORDER — ONDANSETRON HCL 4 MG/2ML IJ SOLN: 4.0000 mg | Freq: Four times a day (QID) | INTRAMUSCULAR | Status: DC | PRN

## 2020-09-18 MED ORDER — NALOXONE HCL 2 MG/2ML IJ SOSY: 1.0000 mg | PREFILLED_SYRINGE | Freq: Once | INTRAMUSCULAR | Status: AC

## 2020-09-18 MED ORDER — PANTOPRAZOLE SODIUM 40 MG IV SOLR
40.0000 mg | Freq: Every day | INTRAVENOUS | Status: DC
  Administered 2020-09-18: 40 mg via INTRAVENOUS
  Filled 2020-09-18: qty 40

## 2020-09-18 MED ORDER — INFLUENZA VAC SPLIT QUAD 0.5 ML IM SUSY
0.5000 mL | PREFILLED_SYRINGE | INTRAMUSCULAR | Status: AC
  Administered 2020-09-19: 0.5 mL via INTRAMUSCULAR
  Filled 2020-09-18: qty 0.5

## 2020-09-18 MED ORDER — DOCUSATE SODIUM 100 MG PO CAPS: 100.0000 mg | ORAL_CAPSULE | Freq: Two times a day (BID) | ORAL | Status: DC | PRN

## 2020-09-18 MED ORDER — POLYETHYLENE GLYCOL 3350 17 G PO PACK: 17.0000 g | PACK | Freq: Every day | ORAL | Status: DC | PRN

## 2020-09-18 NOTE — ED Provider Notes (Signed)
[facility]-EMERGENCY DEPT Provider Note   CSN: [id number] Arrival date & time: 09/17/20  2108     History Chief Complaint  Patient presents with   Loss of Consciousness    Jennifer Harmon is a 59 y.o. female.  The history is provided by a relative. The history is limited by the condition of the patient.  Loss of Consciousness Episode history:  Single Most recent episode:  Today Timing:  Constant Progression:  Resolved Chronicity:  New Relieved by:  Nothing Worsened by:  Nothing Ineffective treatments:  None tried Associated symptoms: no chest pain, no fever and no shortness of breath   Risk factors: no congenital heart disease   Patient with fibromyalgia and chronic pain on multiple narcotics and benzos passed out standing on a chair at home and has been somnolent since.      Past Medical History:  Diagnosis Date   Acute postoperative pain 05/03/2018   Anxiety    Chronic chest pain    Depression    DJD (degenerative joint disease)    Fibromyalgia    GERD (gastroesophageal reflux disease)    Hypercholesterolemia     Patient Active Problem List   Diagnosis Date Noted   Spondylosis without myelopathy or radiculopathy, cervical region 09/08/2020   Cervical facet syndrome (Right) 08/26/2020   Uncomplicated opioid dependence (HCC) 06/11/2020   Cervicalgia 06/11/2020   Abnormal MRI, lumbar spine (02/18/2020) 03/02/2020   Lumbar central spinal stenosis, w/ neurogenic claudication (L3-4) 03/02/2020   Lumbar foraminal stenosis (Right) (L3-4) 03/02/2020   Acute exacerbation of chronic low back pain 02/04/2020   Severe depression (HCC) 02/04/2020   Obesity with body mass index (BMI) of 30.0 to 39.9 02/04/2020   History of claustrophobia 02/04/2020   Generalized anxiety disorder 02/04/2020   Situational depression 12/09/2019   Vitamin D insufficiency 11/18/2019   Trigger point with back pain 11/05/2019   Abnormal MRI,  cervical spine 05/20/2019   S/P Revision of total knee replacement (Right) 04/30/2019   Anxiety 04/18/2019   History of stroke 04/18/2019   Hyperlipidemia 04/18/2019   GERD (gastroesophageal reflux disease) 02/20/2019   NSAID induced gastritis 02/20/2019   DDD (degenerative disc disease), cervical 02/11/2019   Cervical spondylosis with radiculopathy (Right) 02/11/2019   Night cramps 02/11/2019   Chronic knee pain after total replacement (TKR) (Right) 01/03/2019   Pain due to total right knee replacement (HCC) 12/10/2018   Macromastia 01/31/2018   Thoracic spondylosis with radiculopathy 01/31/2018   Osteoarthritis of hip (Bilateral) (R>L) 01/18/2018   Spondylosis without myelopathy or radiculopathy, lumbosacral region 01/03/2018   Chronic hip pain (Bilateral) (R>L) 01/03/2018   Other specified dorsopathies, sacral and sacrococcygeal region 01/03/2018   Lumbar facet syndrome (Bilateral) (R>L) 12/04/2017   Lumbar paraspinal muscle spasm 12/04/2017   Chronic lower extremity pain (Bilateral) (R>L) 11/21/2017   Chronic lower extremity radicular pain (L5) (Right) 11/21/2017   DISH (diffuse idiopathic skeletal hyperostosis) 11/21/2017   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 11/21/2017   DDD (degenerative disc disease), thoracic 11/21/2017   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/2017   Fusion of lumbar spine 11/21/2017   Long term prescription opiate use 11/13/2017   NSAID long-term use 11/13/2017   Opiate use 11/13/2017   Pharmacologic therapy 11/13/2017   Problems influencing health status 11/13/2017   Neurogenic pain 11/13/2017   Chronic musculoskeletal pain 11/13/2017   Chronic recurrent lower extremity muscle spasms (Bilateral) 11/13/2017   Long term prescription benzodiazepine use 11/13/2017   Thoracic radiculopathy due to osteoarthritis  of spine 11/13/2017   Thoracic radiculitis (T6/T7) (Right) 11/13/2017   Chronic low back pain (2ry  area of Pain) (Bilateral) (R>L) 11/12/2017   Chronic knee pain (4th area of Pain) (Bilateral) (R>L) 11/12/2017   Chronic upper back pain (1ry area of Pain) (Bilateral) (R>L) 09/06/2017   Chronic neck pain (3ry area of Pain) (Bilateral) (R>L) 09/06/2017   Chronic shoulder pain (Bilateral) (R>L) 09/06/2017   Chronic upper extremity pain (Bilateral) (R>L) 09/06/2017   Chronic pain syndrome 09/06/2017   Long term current use of opiate analgesic 09/06/2017   Disorder of skeletal system 09/06/2017   Chronic sacroiliac joint pain (Bilateral) (R>L) 09/06/2017   Chronic inflammatory arthritis 07/24/2017   Dyslipidemia 06/30/2017   History of gastroesophageal reflux (GERD) 06/30/2017   History of sleep apnea 06/30/2017   History of depression 06/30/2017   History of anxiety 06/30/2017   Fibromyalgia 06/30/2017   DDD (degenerative disc disease), lumbar 06/30/2017   S/P partial knee replacement (Left) 06/30/2017   S/P total knee replacement (Right) 06/30/2017   Osteoarthritis of feet (Bilateral) 06/30/2017   History of diverticulosis 06/30/2017   Former smoker 06/30/2017   Chronic diarrhea 06/30/2017   Symptomatic mammary hypertrophy 09/19/2016   Osteoarthritis of knee (Right) 03/14/2012   History of knee arthroplasty (Bilateral) 12/27/2011    Past Surgical History:  Procedure Laterality Date   BACK SURGERY  2008   BREAST REDUCTION SURGERY Bilateral 05/25/2020   Procedure: MAMMARY REDUCTION  (BREAST) WITH LIPO;  Surgeon: Allena Napoleon, MD;  Location: [facility] OR;  Service: Plastics;  Laterality: Bilateral;  3 hours please   CESAREAN SECTION     CHOLECYSTECTOMY     JOINT REPLACEMENT Right 04/16/2019   knee     OB History   No obstetric history on file.     Family History  Problem Relation Age of Onset   Hypertension Mother    Arthritis Father     Social History   Tobacco Use   Smoking status: Former Smoker    Packs/day: 0.50    Years: 3.00     Pack years: 1.50    Types: Pipe, Cigarettes    Quit date: 11/08/2015    Years since quitting: 4.8   Smokeless tobacco: Never Used  Vaping Use   Vaping Use: Never used  Substance Use Topics   Alcohol use: No   Drug use: No    Home Medications Prior to Admission medications   Medication Sig Start Date End Date Taking? Authorizing Provider  acetaminophen (TYLENOL) 500 MG tablet Take 500 mg by mouth every 8 (eight) hours as needed for moderate pain.    [provider]  baclofen (LIORESAL) 10 MG tablet Take 1-2 tablets (10-20 mg total) by mouth at bedtime. 09/04/20 12/03/20  Delano Metz, MD  calcium carbonate (CALCIUM 600) 600 MG TABS tablet Take 1 tablet (600 mg total) by mouth 2 (two) times daily with a meal. 09/04/20 12/03/20  Delano Metz, MD  citalopram (CELEXA) 20 MG tablet Take 20 mg by mouth daily. 01/13/20   [provider]  clonazePAM (KLONOPIN) 1 MG tablet Take 1 mg by mouth 3 (three) times daily as needed for anxiety.     [provider]  cyclobenzaprine (FLEXERIL) 10 MG tablet Take 1 tablet (10 mg total) by mouth 3 (three) times daily. 09/04/20 12/03/20  Delano Metz, MD  diclofenac Sodium (VOLTAREN) 1 % GEL Apply 1 application topically in the morning and at bedtime.    [provider]  esomeprazole (NEXIUM) 40 MG  capsule Take 1 capsule (40 mg total) by mouth at bedtime. Take while on ibuprofen. 09/04/20 12/03/20  Delano Metz, MD  famotidine (PEPCID) 40 MG tablet Take 40 mg by mouth daily. 08/03/20   [provider]  latanoprost (XALATAN) 0.005 % ophthalmic solution Place 1 drop into both eyes at bedtime.  07/19/17   [provider]  Magnesium Oxide 500 MG CAPS Take 1 capsule (500 mg total) by mouth at bedtime. 09/04/20 12/03/20  Delano Metz, MD  Multiple Vitamin (MULTI-VITAMIN) tablet Take 1 tablet by mouth daily.     [provider]  oxyCODONE (OXY IR/ROXICODONE) 5 MG immediate release  tablet Take 1 tablet (5 mg total) by mouth every 8 (eight) hours as needed for severe pain. Must last 30 days 09/30/20 10/30/20  Delano Metz, MD  oxyCODONE (OXY IR/ROXICODONE) 5 MG immediate release tablet Take 1 tablet (5 mg total) by mouth every 8 (eight) hours as needed for severe pain. Must last 30 days 10/30/20 11/29/20  Delano Metz, MD  oxyCODONE (OXY IR/ROXICODONE) 5 MG immediate release tablet Take 1 tablet (5 mg total) by mouth every 8 (eight) hours as needed for severe pain. Must last 30 days 11/29/20 12/29/20  Delano Metz, MD  pregabalin (LYRICA) 150 MG capsule Take 1 capsule (150 mg total) by mouth 3 (three) times daily. 09/04/20 12/03/20  Delano Metz, MD  rosuvastatin (CRESTOR) 10 MG tablet Take 10 mg by mouth daily.  08/03/18   [provider]    Allergies    Aspirin, Bupropion, Ibuprofen, and Tape  Review of Systems   Review of Systems  Unable to perform ROS: Mental status change  Constitutional: Negative for fever.  HENT: Negative for congestion.   Eyes: Negative for visual disturbance.  Respiratory: Negative for shortness of breath.   Cardiovascular: Positive for syncope. Negative for chest pain.    Physical Exam Updated Vital Signs BP 103/67    Pulse 83    Temp 97.8 F (36.6 C) (Axillary)    Resp 12    Ht 5\' 3"  (1.6 m)    Wt 87.5 kg    SpO2 98%    BMI 34.17 kg/m   Physical Exam Vitals and nursing note reviewed.  Constitutional:      Appearance: She is not diaphoretic.  HENT:     Head: Normocephalic and atraumatic.     Nose: Nose normal.  Eyes:     Comments: Pinpoint pupils   Cardiovascular:     Rate and Rhythm: Normal rate and regular rhythm.     Pulses: Normal pulses.     Heart sounds: Normal heart sounds.  Pulmonary:     Effort: Pulmonary effort is normal.     Breath sounds: Normal breath sounds.  Abdominal:     General: Abdomen is flat. Bowel sounds are normal.     Palpations: Abdomen is soft.     Tenderness: There is  no abdominal tenderness. There is no guarding.  Musculoskeletal:     Right lower leg: No edema.     Left lower leg: No edema.  Lymphadenopathy:     Cervical: No cervical adenopathy.  Skin:    General: Skin is warm and dry.     Capillary Refill: Capillary refill takes less than 2 seconds.  Neurological:     Deep Tendon Reflexes: Reflexes normal.      ED Results / Procedures / Treatments   Labs (all labs ordered are listed, but only abnormal results are displayed) Labs Reviewed  BASIC METABOLIC  PANEL - Abnormal; Notable for the following components:      Result Value   Glucose, Bld 136 (*)    All other components within normal limits  CBC - Abnormal; Notable for the following components:   WBC 11.0 (*)    All other components within normal limits  RAPID URINE DRUG SCREEN, HOSP PERFORMED - Abnormal; Notable for the following components:   Opiates POSITIVE (*)    Benzodiazepines POSITIVE (*)    All other components within normal limits  URINALYSIS, ROUTINE W REFLEX MICROSCOPIC - Abnormal; Notable for the following components:   Protein, ur 30 (*)    All other components within normal limits  ACETAMINOPHEN LEVEL - Abnormal; Notable for the following components:   Acetaminophen (Tylenol), Serum <10 (*)    All other components within normal limits  SALICYLATE LEVEL - Abnormal; Notable for the following components:   Salicylate Lvl <7.0 (*)    All other components within normal limits  BLOOD GAS, ARTERIAL - Abnormal; Notable for the following components:   pO2, Arterial 80.5 (*)    Bicarbonate 28.5 (*)    Acid-Base Excess 3.4 (*)    All other components within normal limits  CBG MONITORING, ED - Abnormal; Notable for the following components:   Glucose-Capillary 132 (*)    All other components within normal limits  RESPIRATORY PANEL BY RT PCR (FLU A&B, COVID)  URINALYSIS, ROUTINE W REFLEX MICROSCOPIC  ETHANOL  TROPONIN I (HIGH SENSITIVITY)  TROPONIN I (HIGH SENSITIVITY)     EKG EKG Interpretation  Date/Time:  Thursday September 17 2020 22:09:16 EST Ventricular Rate:  92 PR Interval:    QRS Duration: 73 QT Interval:  357 QTC Calculation: 442 R Axis:   38 Text Interpretation: Sinus rhythm Baseline wander in lead(s) V4 Confirmed by Nicanor Alcon, Ozella Comins ([id number]) on 09/17/2020 11:56:20 PM   Radiology CT Head Wo Contrast  Result Date: 09/18/2020 CLINICAL DATA:  Fall with head trauma EXAM: CT HEAD WITHOUT CONTRAST TECHNIQUE: Contiguous axial images were obtained from the base of the skull through the vertex without intravenous contrast. COMPARISON:  None. FINDINGS: Brain: There is no mass, hemorrhage or extra-axial collection. The size and configuration of the ventricles and extra-axial CSF spaces are normal. The brain parenchyma is normal, without acute or chronic infarction. Vascular: No abnormal hyperdensity of the major intracranial arteries or dural venous sinuses. No intracranial atherosclerosis. Skull: The visualized skull base, calvarium and extracranial soft tissues are normal. Sinuses/Orbits: No fluid levels or advanced mucosal thickening of the visualized paranasal sinuses. No mastoid or middle ear effusion. The orbits are normal. IMPRESSION: Normal head CT. Electronically Signed   By: Deatra Robinson M.D.   On: 09/18/2020 00:23    Procedures Procedures (including critical care time)  Medications Ordered in ED Medications  naloxone HCl (NARCAN) 4 mg in dextrose 5 % 250 mL infusion (1 mg/hr Intravenous New Bag/Given 09/18/20 0205)  naloxone (NARCAN) injection 1 mg (1 mg Intravenous Given 09/18/20 0112)    ED Course  I have reviewed the triage vital signs and the nursing notes.  Pertinent labs & imaging results that were available during my care of the patient were reviewed by me and considered in my medical decision making (see chart for details).   Patient has multiple narcotics and benzos and husband says patient doses her own medication.  Based on  pill containers husband provided patient is also taking baclofen and flexeril and 75 mg of benadryl at a time. This is a polysubstance overdose with  undeternined intent.  Patient is arousable on narcan but still somnolent and I believe this is the result of muscle relaxants and benadryl  MDM Number of Diagnoses or Management Options Drug overdose, undetermined intent, initial encounter: new, needed workup Polypharmacy: new, needed workup Syncope and collapse: new, needed workup   Amount and/or Complexity of Data Reviewed Clinical lab tests: ordered and reviewed Tests in the radiology section of CPT: ordered and reviewed Tests in the medicine section of CPT: reviewed Obtain history from someone other than the patient: yes Review and summarize past medical records: yes Independent visualization of images, tracings, or specimens: yes  Risk of Complications, Morbidity, and/or Mortality Presenting problems: high Diagnostic procedures: high Management options: high  Critical Care Total time providing critical care: 75-105 minutes (narcan drip ) MDM Reviewed: previous chart, nursing note and vitals Interpretation: labs, ECG and CT scan (negative head ct by me, normal troponin benzos and opioids on uds ) Total time providing critical care: 75-105 minutes (narcan drip ). This excludes time spent performing separately reportable procedures and services. Consults: critical care   Final Clinical Impression(s) / ED Diagnoses Final diagnoses:  Polypharmacy  Drug overdose, undetermined intent, initial encounter  Syncope and collapse    Syncope secondary to polypharmacy and overdose    Lexus Shampine, MD 09/18/20 3086

## 2020-09-18 NOTE — H&P (Signed)
NAME:  Jennifer Harmon, MRN:  [id number], DOB:  26-Dec-1960, LOS: 0 ADMISSION DATE:  09/17/2020,   CHIEF COMPLAINT: Polypharmacy overdose  Brief History   This is a 59 year old female that presented with polypharmacy overdose.  History of present illness   This is a 59 year old black female that presented to the emergency room from home.  Patient had a syncopal episode at home and struck her head on the stove.  Family relayed to the emergency room staff that the patient has chronic pain but uses her medications as she wants and not necessarily as prescribed.  It is unclear which of her many medications she may have overdosed on.  She was started on Narcan infusion with minimal improvement of her neurologic status.   Past Medical History  Chronic pain DJD Anxiety Depression Fibromyalgia Hyperlipidemia GERD  Micro Data:  Influenza negative Covid negative  Antimicrobials:  None   Objective   Blood pressure (!) 132/91, pulse 89, temperature 97.8 F (36.6 C), temperature source Axillary, resp. rate 17, height 5\' 3"  (1.6 m), weight 87.5 kg, SpO2 97 %.       No intake or output data in the 24 hours ending 09/18/20 0550 Filed Weights   09/18/20 0035  Weight: 87.5 kg    Examination: General: Lethargic but does intermittently verbalize pain. HENT: Atraumatic/normocephalic mucous membranes are moist Lungs: Clear to auscultation bilaterally no wheezing rales or rhonchi noted. Cardiovascular: Regular rate no murmur rub or gallop appreciated Abdomen: Soft, nondistended, positive bowel sounds, no rebound/rigidity/guarding though limited by neurologic status Extremities: Distal pulse intact x4.  No edema no cyanosis or clubbing noted. Neuro: Patient is lethargic but verbalizes pain intermittently does not consistently follow commands.  Moves all extremities. GU: Foley catheter intact.   Assessment & Plan:  Polypharmacy overdose intentional versus unintentional Chronic pain  syndrome Depression Anxiety  Plan: Patient be admitted to the intensive care unit for further work-up. Serial neurochecks every 1 hours Patient was started on Narcan infusion which may be having some benefit therefore will be continued at this time. Most likely psychiatry will need to see the patient once she is more awake and appropriate. Abdominal x-ray to rule out bezoar N.p.o. until neurologic status improves. Monitor I's/O's.   Best practice:  Diet: N.p.o. Pain/Anxiety/Delirium protocol (if indicated): None  VAP protocol (if indicated): N/A DVT prophylaxis: Lovenox GI prophylaxis: Protonix Glucose control: Monitor blood sugar Mobility: Bedrest until neurologic status improves Code Status: Full Family Communication: Disposition: Admit to intensive care unit  Labs   CBC: Recent Labs  Lab 09/17/20 2156 09/18/20 0520  WBC 11.0* 8.4  HGB 13.3 12.3  HCT 42.1 38.9  MCV 89.0 88.0  PLT 278 267    Basic Metabolic Panel: Recent Labs  Lab 09/17/20 2156 09/18/20 0520  NA 141 143  K 4.1 4.0  CL 103 109  CO2 27 25  GLUCOSE 136* 153*  BUN 16 14  CREATININE 0.64 0.52  CALCIUM 9.6 9.3  MG  --  2.1  PHOS  --  3.3   GFR: Estimated Creatinine Clearance: 78.4 mL/min (by C-G formula based on SCr of 0.52 mg/dL). Recent Labs  Lab 09/17/20 2156 09/18/20 0520  WBC 11.0* 8.4    Liver Function Tests: No results for input(s): AST, ALT, ALKPHOS, BILITOT, PROT, ALBUMIN in the last 168 hours. No results for input(s): LIPASE, AMYLASE in the last 168 hours. No results for input(s): AMMONIA in the last 168 hours.  ABG    Component Value Date/Time  PHART 7.400 09/18/2020 0110   PCO2ART 46.8 09/18/2020 0110   PO2ART 80.5 (L) 09/18/2020 0110   HCO3 28.5 (H) 09/18/2020 0110   O2SAT 96.1 09/18/2020 0110     Coagulation Profile: No results for input(s): INR, PROTIME in the last 168 hours.  Cardiac Enzymes: No results for input(s): CKTOTAL, CKMB, CKMBINDEX, TROPONINI in  the last 168 hours.  HbA1C: No results found for: HGBA1C  CBG: Recent Labs  Lab 09/17/20 2154  GLUCAP 132*    Review of Systems:   Unable to obtain secondary neurologic status  Past Medical History  She,  has a past medical history of Acute postoperative pain (05/03/2018), Anxiety, Chronic chest pain, Depression, DJD (degenerative joint disease), Fibromyalgia, GERD (gastroesophageal reflux disease), and Hypercholesterolemia.   Surgical History    Past Surgical History:  Procedure Laterality Date  . BACK SURGERY  2008  . BREAST REDUCTION SURGERY Bilateral 05/25/2020   Procedure: MAMMARY REDUCTION  (BREAST) WITH LIPO;  Surgeon: Allena Napoleon, MD;  Location: [facility] OR;  Service: Plastics;  Laterality: Bilateral;  3 hours please  . CESAREAN SECTION    . CHOLECYSTECTOMY    . JOINT REPLACEMENT Right 04/16/2019   knee     Social History   reports that she quit smoking about 4 years ago. Her smoking use included pipe and cigarettes. She has a 1.50 pack-year smoking history. She has never used smokeless tobacco. She reports that she does not drink alcohol and does not use drugs.   Family History   Her family history includes Arthritis in her father; Hypertension in her mother.   Allergies Allergies  Allergen Reactions  . Aspirin Other (See Comments)    Ulcers  . Bupropion     sleepiness  . Ibuprofen Other (See Comments)    ulcers  . Tape     Silk tape tears skin     Home Medications  Prior to Admission medications   Medication Sig Start Date End Date Taking? Authorizing Provider  acetaminophen (TYLENOL) 500 MG tablet Take 500 mg by mouth every 8 (eight) hours as needed for moderate pain.    [provider]  baclofen (LIORESAL) 10 MG tablet Take 1-2 tablets (10-20 mg total) by mouth at bedtime. 09/04/20 12/03/20  Delano Metz, MD  calcium carbonate (CALCIUM 600) 600 MG TABS tablet Take 1 tablet (600 mg total) by mouth 2 (two) times daily with a meal. 09/04/20  12/03/20  Delano Metz, MD  citalopram (CELEXA) 20 MG tablet Take 20 mg by mouth daily. 01/13/20   [provider]  clonazePAM (KLONOPIN) 1 MG tablet Take 1 mg by mouth 3 (three) times daily as needed for anxiety.     [provider]  cyclobenzaprine (FLEXERIL) 10 MG tablet Take 1 tablet (10 mg total) by mouth 3 (three) times daily. 09/04/20 12/03/20  Delano Metz, MD  diclofenac Sodium (VOLTAREN) 1 % GEL Apply 1 application topically in the morning and at bedtime.    [provider]  esomeprazole (NEXIUM) 40 MG capsule Take 1 capsule (40 mg total) by mouth at bedtime. Take while on ibuprofen. 09/04/20 12/03/20  Delano Metz, MD  famotidine (PEPCID) 40 MG tablet Take 40 mg by mouth daily. 08/03/20   [provider]  latanoprost (XALATAN) 0.005 % ophthalmic solution Place 1 drop into both eyes at bedtime.  07/19/17   [provider]  Magnesium Oxide 500 MG CAPS Take 1 capsule (500 mg total) by mouth at bedtime. 09/04/20 12/03/20  Delano Metz, MD  Multiple Vitamin (MULTI-VITAMIN) tablet Take 1 tablet by mouth daily.     [provider]  oxyCODONE (OXY IR/ROXICODONE) 5 MG immediate release tablet Take 1 tablet (5 mg total) by mouth every 8 (eight) hours as needed for severe pain. Must last 30 days 09/30/20 10/30/20  Delano Metz, MD  oxyCODONE (OXY IR/ROXICODONE) 5 MG immediate release tablet Take 1 tablet (5 mg total) by mouth every 8 (eight) hours as needed for severe pain. Must last 30 days 10/30/20 11/29/20  Delano Metz, MD  oxyCODONE (OXY IR/ROXICODONE) 5 MG immediate release tablet Take 1 tablet (5 mg total) by mouth every 8 (eight) hours as needed for severe pain. Must last 30 days 11/29/20 12/29/20  Delano Metz, MD  pregabalin (LYRICA) 150 MG capsule Take 1 capsule (150 mg total) by mouth 3 (three) times daily. 09/04/20 12/03/20  Delano Metz, MD  rosuvastatin (CRESTOR) 10 MG tablet Take 10 mg by mouth  daily.  08/03/18   [provider]     Critical care time: 

## 2020-09-18 NOTE — ED Notes (Signed)
Paged MD to ask about if want to continue narcan drip. Pharmacy tubing another drip

## 2020-09-18 NOTE — ED Notes (Signed)
Pt heard screaming from nurses station, RN entered room to assess pt.  When asked why she was screaming, pt reports "I have to pee, no one will help me, you keep saying I need to pee but won't help me."  This RN informed pt that she has a purewick in place that is secured with brief, she may pee when she feels the need. Pt resumed screaming, again the RN encouraged pt not to yell, she may use call bell when assistance is needed.  Call bell on pts abdomen.

## 2020-09-18 NOTE — ED Notes (Signed)
Updates provided to posion control

## 2020-09-18 NOTE — ED Notes (Addendum)
Pt assisted to bedside commode, standby assistance.  Pt more cooperative at this time, able to follow commands, asking why she was here.  Pt informed that she had overdosed on home medications, was here due to her being unresponsive at home.  Pt also asking when can she eat, RN informed her that she would ask the admitting provider and would let her know.   Pt with no other questions at this time, will continue to monitor.

## 2020-09-18 NOTE — Progress Notes (Signed)
eLink Physician-Brief Progress Note Patient Name: Jennifer Harmon DOB: 1961-07-04 MRN: [id number]   Date of Service  09/18/2020  HPI/Events of Note  Multiple issues: 1. Need order for Q 1 hour Neuro checks and 2. Patient c/o neck pain. Patient still on a Narcan IV infusion. Creatinine = 0.52.   eICU Interventions  Plan: 1. Neuro checks Q 1 hour. 2. Voltaren Cream 1% apply 2 gm to neck Q 8 hours PRN pain.      Intervention Category Major Interventions: Other:  Lenell Antu 09/18/2020, 11:16 PM

## 2020-09-18 NOTE — ED Notes (Signed)
Per poison control, recommends rechecking acetaminophen levels, giving IV fluids if systolic BP becomes lower than 100, and monitoring the patient for 7 hours after ending naloxone infusion.

## 2020-09-18 NOTE — ED Notes (Signed)
Pt provided lunch tray, husband at bedside. Pt cooperative at this time.  Will continue to monitor.

## 2020-09-18 NOTE — ED Notes (Signed)
Assumed care of patient at this time, nad noted, sr up x2, bed locked and low, call bell w/I reach.  Will continue to monitor.  Narcan drip is infusing well with no s/s of infiltration.  Patient appears to be resting well.

## 2020-09-19 DIAGNOSIS — T50901A Poisoning by unspecified drugs, medicaments and biological substances, accidental (unintentional), initial encounter: Secondary | ICD-10-CM

## 2020-09-19 DIAGNOSIS — F4321 Adjustment disorder with depressed mood: Secondary | ICD-10-CM

## 2020-09-19 LAB — COMPREHENSIVE METABOLIC PANEL
ALT: 33 U/L (ref 0–44)
AST: 24 U/L (ref 15–41)
Albumin: 4.3 g/dL (ref 3.5–5.0)
Alkaline Phosphatase: 123 U/L (ref 38–126)
Anion gap: 11 (ref 5–15)
BUN: 12 mg/dL (ref 6–20)
CO2: 27 mmol/L (ref 22–32)
Calcium: 9.8 mg/dL (ref 8.9–10.3)
Chloride: 104 mmol/L (ref 98–111)
Creatinine, Ser: 0.64 mg/dL (ref 0.44–1.00)
GFR, Estimated: >60 mL/min (ref >60)
Glucose, Bld: 154 mg/dL — ABNORMAL HIGH (ref 70–99)
Potassium: 3.6 mmol/L (ref 3.5–5.1)
Sodium: 142 mmol/L (ref 135–145)
Total Bilirubin: 0.8 mg/dL (ref 0.3–1.2)
Total Protein: 7.5 g/dL (ref 6.5–8.1)

## 2020-09-19 LAB — CBC
HCT: 41.5 % (ref 36.0–46.0)
Hemoglobin: 12.8 g/dL (ref 12.0–15.0)
MCH: 27.6 pg (ref 26.0–34.0)
MCHC: 30.8 g/dL (ref 30.0–36.0)
MCV: 89.6 fL (ref 80.0–100.0)
Platelets: 268 10*3/uL (ref 150–400)
RBC: 4.63 MIL/uL (ref 3.87–5.11)
RDW: 13.5 % (ref 11.5–15.5)
WBC: 10.3 10*3/uL (ref 4.0–10.5)
nRBC: 0 % (ref 0.0–0.2)

## 2020-09-19 MED ORDER — FAMOTIDINE 20 MG PO TABS
40.0000 mg | ORAL_TABLET | Freq: Every day | ORAL | Status: DC
  Administered 2020-09-19: 40 mg via ORAL
  Filled 2020-09-19: qty 2

## 2020-09-19 MED ORDER — CALCIUM CARBONATE 1250 (500 CA) MG PO TABS
1.0000 | ORAL_TABLET | Freq: Two times a day (BID) | ORAL | Status: DC
  Administered 2020-09-19: 500 mg via ORAL
  Filled 2020-09-19: qty 1

## 2020-09-19 MED ORDER — CLONAZEPAM 1 MG PO TABS: 1.0000 mg | ORAL_TABLET | Freq: Three times a day (TID) | ORAL | Status: DC | PRN

## 2020-09-19 MED ORDER — POLYVINYL ALCOHOL 1.4 % OP SOLN: 1.0000 [drp] | OPHTHALMIC | Status: DC | PRN

## 2020-09-19 MED ORDER — MAGNESIUM OXIDE 400 (241.3 MG) MG PO TABS: 400.0000 mg | ORAL_TABLET | Freq: Every day | ORAL | Status: DC

## 2020-09-19 MED ORDER — CITALOPRAM HYDROBROMIDE 20 MG PO TABS
20.0000 mg | ORAL_TABLET | Freq: Every day | ORAL | Status: DC
  Administered 2020-09-19: 20 mg via ORAL
  Filled 2020-09-19: qty 1

## 2020-09-19 MED ORDER — CYCLOBENZAPRINE HCL 10 MG PO TABS
10.0000 mg | ORAL_TABLET | Freq: Three times a day (TID) | ORAL | Status: DC
  Administered 2020-09-19: 10 mg via ORAL
  Filled 2020-09-19: qty 1

## 2020-09-19 MED ORDER — PREGABALIN 75 MG PO CAPS
150.0000 mg | ORAL_CAPSULE | Freq: Three times a day (TID) | ORAL | Status: DC
  Administered 2020-09-19: 150 mg via ORAL
  Filled 2020-09-19: qty 2

## 2020-09-19 MED ORDER — ACETAMINOPHEN 500 MG PO TABS: 500.0000 mg | ORAL_TABLET | Freq: Three times a day (TID) | ORAL | Status: DC | PRN

## 2020-09-19 MED ORDER — DIPHENHYDRAMINE HCL 25 MG PO CAPS: 25.0000 mg | ORAL_CAPSULE | Freq: Four times a day (QID) | ORAL | Status: DC | PRN

## 2020-09-19 MED ORDER — LATANOPROST 0.005 % OP SOLN
1.0000 [drp] | Freq: Every day | OPHTHALMIC | Status: DC
  Filled 2020-09-19: qty 2.5

## 2020-09-19 MED ORDER — ADULT MULTIVITAMIN W/MINERALS CH
1.0000 | ORAL_TABLET | Freq: Every day | ORAL | Status: DC
  Administered 2020-09-19: 1 via ORAL
  Filled 2020-09-19: qty 1

## 2020-09-19 NOTE — Discharge Summary (Signed)
Physician Discharge Summary  Jennifer Harmon HYQ:[id number] DOB: [birth year] DOA: 09/17/2020  PCP: Ignatius Specking, MD  Admit date: 09/17/2020 Discharge date: 09/19/2020  Admitted From: Home Disposition: Home  Recommendations for Outpatient Follow-up:  1. Follow up with PCP in 1-2 weeks 2. Schedule follow-up with your pain management clinic  Home Health: Not applicable Equipment/Devices: Not applicable  Discharge Condition: Stable CODE STATUS: Full code Diet recommendation: Regular diet  Discharge summary: 59 year old female with fibromyalgia and chronic pain syndrome on multiple medication regimen including Baclofen, Flexeril, Lyrica, oxycodone, Klonopin brought to the ER when she was found passed out by her daughter.  Patient was babysitting her 72-year-old grandson, did not know how long she passed out. In the ER, hemodynamically stable.  Depressed mentation and was somnolent.  Patient was admitted to the hospital due to significant symptoms.  Initially treated with Narcan infusion, however she remained stable throughout.  Subsequently, patient woke up and currently is stabilized.  Patient denied any drug overdose.  She denied any intentional or accidental drug overdose.  UDS was positive for benzos and opiates consistent with her prescription.  No seizure-like activities noted.  No incontinence noted.  Assessment and plan: Polysubstance overdose, suspect intake of multiple similar acting medication.  Patient is on both baclofen and Flexeril along with above-mentioned medications.  Currently stabilized.  Denies any suicidal homicidal ideation.  She has remained without any distress or withdrawal from narcotics.  -Discharge home.  She will resume her Flexeril, discontinue baclofen.  She will resume her Lyrica.  She is resume clonidine as needed.  She will not take any opiates. -This was discussed with patient's husband at the bedside who agrees to monitor her pill intake. -Patient will  go back to her pain management specialist. -Discharge medication list provided to avoid duplication/patient and husband educated to discard any medications that is not on the updated list.  Discharge Diagnoses:  Active Problems:   Overdose of drug    Discharge Instructions  Discharge Instructions    Diet - low sodium heart healthy   Complete by: As directed    Discharge instructions   Complete by: As directed    Do not take any opiates until follow-up with your pain management doctor. Call and schedule follow-up with your primary care doctor and pain management clinic. All-time fall precautions.  Driving precautions.   Increase activity slowly   Complete by: As directed      Allergies as of 09/19/2020      Reactions   Aspirin Other (See Comments)   Ulcers   Bupropion    sleepiness   Ibuprofen Other (See Comments)   ulcers   Tape    Silk tape tears skin      Medication List    STOP taking these medications   baclofen 10 MG tablet Commonly known as: LIORESAL   oxyCODONE 5 MG immediate release tablet Commonly known as: Oxy IR/ROXICODONE     TAKE these medications   acetaminophen 500 MG tablet Commonly known as: TYLENOL Take 500 mg by mouth every 8 (eight) hours as needed for moderate pain.   calcium carbonate 600 MG Tabs tablet Commonly known as: Calcium 600 Take 1 tablet (600 mg total) by mouth 2 (two) times daily with a meal.   citalopram 20 MG tablet Commonly known as: CELEXA Take 20 mg by mouth daily.   clonazePAM 1 MG tablet Commonly known as: KLONOPIN Take 1 mg by mouth 3 (three) times daily as needed for anxiety.  cyclobenzaprine 10 MG tablet Commonly known as: FLEXERIL Take 1 tablet (10 mg total) by mouth 3 (three) times daily.   diclofenac Sodium 1 % Gel Commonly known as: VOLTAREN Apply 1 application topically 2 (two) times daily as needed (pain).   diphenhydrAMINE 25 mg capsule Commonly known as: BENADRYL Take 25 mg by mouth every 6  (six) hours as needed for allergies.   esomeprazole 40 MG capsule Commonly known as: NEXIUM Take 1 capsule (40 mg total) by mouth at bedtime. Take while on ibuprofen.   famotidine 40 MG tablet Commonly known as: PEPCID Take 40 mg by mouth daily.   latanoprost 0.005 % ophthalmic solution Commonly known as: XALATAN Place 1 drop into both eyes at bedtime.   Magnesium Oxide 500 MG Caps Take 1 capsule (500 mg total) by mouth at bedtime.   Multi-Vitamin tablet Take 1 tablet by mouth daily.   polyvinyl alcohol 1.4 % ophthalmic solution Commonly known as: LIQUIFILM TEARS Place 1 drop into both eyes as needed for dry eyes.   pregabalin 150 MG capsule Commonly known as: Lyrica Take 1 capsule (150 mg total) by mouth 3 (three) times daily.   rosuvastatin 10 MG tablet Commonly known as: CRESTOR Take 10 mg by mouth daily.       Follow-up Information    Delano Metz, MD Follow up.   Specialty: Pain Medicine Contact information: [address] [place] [address] [phone]        Doreen Beam B, MD Follow up in 1 week(s).   Specialty: Internal Medicine Contact information: [address] [place] [address] [phone]              Allergies  Allergen Reactions  . Aspirin Other (See Comments)    Ulcers  . Bupropion     sleepiness  . Ibuprofen Other (See Comments)    ulcers  . Tape     Silk tape tears skin    Consultations:  Psychiatry   Procedures/Studies: DG Abd 1 View  Result Date: 09/18/2020 CLINICAL DATA:  Over does and syncope EXAM: ABDOMEN - 1 VIEW COMPARISON:  None. FINDINGS: Large volume stool retention without rectal loading. No small bowel dilatation. No concerning mass effect or calcification. Lumbar fusion and cholecystectomy clips. Bilateral hip osteoarthritis. IMPRESSION: Constipated appearance. Electronically Signed   By: Marnee Spring M.D.   On: 09/18/2020 04:42   CT Head Wo Contrast  Result Date:  09/18/2020 CLINICAL DATA:  Fall with head trauma EXAM: CT HEAD WITHOUT CONTRAST TECHNIQUE: Contiguous axial images were obtained from the base of the skull through the vertex without intravenous contrast. COMPARISON:  None. FINDINGS: Brain: There is no mass, hemorrhage or extra-axial collection. The size and configuration of the ventricles and extra-axial CSF spaces are normal. The brain parenchyma is normal, without acute or chronic infarction. Vascular: No abnormal hyperdensity of the major intracranial arteries or dural venous sinuses. No intracranial atherosclerosis. Skull: The visualized skull base, calvarium and extracranial soft tissues are normal. Sinuses/Orbits: No fluid levels or advanced mucosal thickening of the visualized paranasal sinuses. No mastoid or middle ear effusion. The orbits are normal. IMPRESSION: Normal head CT. Electronically Signed   By: Deatra Robinson M.D.   On: 09/18/2020 00:23   DG PAIN CLINIC C-ARM 1-60 MIN NO REPORT  Result Date: 09/08/2020 Fluoro was used, but no Radiologist interpretation will be provided. Please refer to "NOTES" tab for provider progress note.   (Echo, Carotid, EGD, Colonoscopy, ERCP)    Subjective: Patient  was seen and examined.  No overnight events.  She did not get good night sleep so really frustrated.  Eager to go home and get some sleep at her bed. Does not recall how she passed out.  She does not recall taking any extra medications. Never happened this before to her.   Discharge Exam: Vitals:   09/19/20 0509 09/19/20 1100  BP: (!) 153/63   Pulse:    Resp:    Temp:  98.2 F (36.8 C)  SpO2:     Vitals:   09/19/20 0400 09/19/20 0500 09/19/20 0509 09/19/20 1100  BP: 137/80  (!) 153/63   Pulse: 89     Resp: 17 20    Temp: 98 F (36.7 C)   98.2 F (36.8 C)  TempSrc: Oral   Oral  SpO2: 97%     Weight:  91.3 kg    Height:        General: Pt is alert, awake, not in acute distress Cardiovascular: RRR, S1/S2 +, no rubs, no  gallops Respiratory: CTA bilaterally, no wheezing, no rhonchi Abdominal: Soft, NT, ND, bowel sounds + Extremities: no edema, no cyanosis She was mobilized around the hallway with no symptoms.    The results of significant diagnostics from this hospitalization (including imaging, microbiology, ancillary and laboratory) are listed below for reference.     Microbiology: Recent Results (from the past 240 hour(s))  Respiratory Panel by RT PCR (Flu A&B, Covid) - Nasopharyngeal Swab     Status: None   Collection Time: 09/18/20  3:03 AM   Specimen: Nasopharyngeal Swab  Result Value Ref Range Status   SARS Coronavirus 2 by RT PCR NEGATIVE NEGATIVE Final    Comment: (NOTE) SARS-CoV-2 target nucleic acids are NOT DETECTED.  The SARS-CoV-2 RNA is generally detectable in upper respiratoy specimens during the acute phase of infection. The lowest concentration of SARS-CoV-2 viral copies this assay can detect is 131 copies/mL. A negative result does not preclude SARS-Cov-2 infection and should not be used as the sole basis for treatment or other patient management decisions. A negative result may occur with  improper specimen collection/handling, submission of specimen other than nasopharyngeal swab, presence of viral mutation(s) within the areas targeted by this assay, and inadequate number of viral copies (<131 copies/mL). A negative result must be combined with clinical observations, patient history, and epidemiological information. The expected result is Negative.  Fact Sheet for Patients:  https://www.moore.com/  Fact Sheet for Healthcare Providers:  https://www.young.biz/  This test is no t yet approved or cleared by the [place] FDA and  has been authorized for detection and/or diagnosis of SARS-CoV-2 by FDA under an Emergency Use Authorization (EUA). This EUA will remain  in effect (meaning this test can be used) for the duration of  the COVID-19 declaration under Section 564(b)(1) of the Act, 21 U.S.C. section 360bbb-3(b)(1), unless the authorization is terminated or revoked sooner.     Influenza A by PCR NEGATIVE NEGATIVE Final   Influenza B by PCR NEGATIVE NEGATIVE Final    Comment: (NOTE) The Xpert Xpress SARS-CoV-2/FLU/RSV assay is intended as an aid in  the diagnosis of influenza from Nasopharyngeal swab specimens and  should not be used as a sole basis for treatment. Nasal washings and  aspirates are unacceptable for Xpert Xpress SARS-CoV-2/FLU/RSV  testing.  Fact Sheet for Patients: https://www.moore.com/  Fact Sheet for Healthcare Providers: https://www.young.biz/  This test is not yet approved or cleared by the [place] and  has been authorized  for detection and/or diagnosis of SARS-CoV-2 by  FDA under an Emergency Use Authorization (EUA). This EUA will remain  in effect (meaning this test can be used) for the duration of the  Covid-19 declaration under Section 564(b)(1) of the Act, 21  U.S.C. section 360bbb-3(b)(1), unless the authorization is  terminated or revoked. Performed at [facility], [address] [place], [place] [address]   MRSA PCR Screening     Status: Abnormal   Collection Time: 09/18/20  6:14 PM   Specimen: Nasopharyngeal  Result Value Ref Range Status   MRSA by PCR POSITIVE (A) NEGATIVE Final    Comment:        The GeneXpert MRSA Assay (FDA approved for NASAL specimens only), is one component of a comprehensive MRSA colonization surveillance program. It is not intended to diagnose MRSA infection nor to guide or monitor treatment for MRSA infections. RESULT CALLED TO, READ BACK BY AND VERIFIED WITH: Roosvelt Harps RN @ 2141 09/18/2020 BY BOWMAN,K  Performed at [facility], [address] [place], [place] [address]      Labs: BNP (last 3 results) No results for input(s): BNP in the  last 8760 hours. Basic Metabolic Panel: Recent Labs  Lab 09/17/20 2156 09/18/20 0520 09/19/20 0306  NA 141 143 142  K 4.1 4.0 3.6  CL 103 109 104  CO2 27 25 27   GLUCOSE 136* 153* 154*  BUN 16 14 12   CREATININE 0.64 0.52 0.64  CALCIUM 9.6 9.3 9.8  MG  --  2.1  --   PHOS  --  3.3  --    Liver Function Tests: Recent Labs  Lab 09/19/20 0306  AST 24  ALT 33  ALKPHOS 123  BILITOT 0.8  PROT 7.5  ALBUMIN 4.3   No results for input(s): LIPASE, AMYLASE in the last 168 hours. No results for input(s): AMMONIA in the last 168 hours. CBC: Recent Labs  Lab 09/17/20 2156 09/18/20 0520 09/19/20 0306  WBC 11.0* 8.4 10.3  HGB 13.3 12.3 12.8  HCT 42.1 38.9 41.5  MCV 89.0 88.0 89.6  PLT 278 267 268   Cardiac Enzymes: No results for input(s): CKTOTAL, CKMB, CKMBINDEX, TROPONINI in the last 168 hours. BNP: Invalid input(s): POCBNP CBG: Recent Labs  Lab 09/17/20 2154  GLUCAP 132*   D-Dimer No results for input(s): DDIMER in the last 72 hours. Hgb A1c No results for input(s): HGBA1C in the last 72 hours. Lipid Profile No results for input(s): CHOL, HDL, LDLCALC, TRIG, CHOLHDL, LDLDIRECT in the last 72 hours. Thyroid function studies No results for input(s): TSH, T4TOTAL, T3FREE, THYROIDAB in the last 72 hours.  Invalid input(s): FREET3 Anemia work up No results for input(s): VITAMINB12, FOLATE, FERRITIN, TIBC, IRON, RETICCTPCT in the last 72 hours. Urinalysis    Component Value Date/Time   COLORURINE YELLOW 09/18/2020 0055   APPEARANCEUR CLEAR 09/18/2020 0055   LABSPEC 1.026 09/18/2020 0055   PHURINE 5.0 09/18/2020 0055   GLUCOSEU NEGATIVE 09/18/2020 0055   HGBUR NEGATIVE 09/18/2020 0055   BILIRUBINUR NEGATIVE 09/18/2020 0055   KETONESUR NEGATIVE 09/18/2020 0055   PROTEINUR 30 (A) 09/18/2020 0055   NITRITE NEGATIVE 09/18/2020 0055   LEUKOCYTESUR NEGATIVE 09/18/2020 0055   Sepsis Labs Invalid input(s): PROCALCITONIN,  WBC,  LACTICIDVEN Microbiology Recent  Results (from the past 240 hour(s))  Respiratory Panel by RT PCR (Flu A&B, Covid) - Nasopharyngeal Swab     Status: None   Collection Time: 09/18/20  3:03 AM   Specimen: Nasopharyngeal  Swab  Result Value Ref Range Status   SARS Coronavirus 2 by RT PCR NEGATIVE NEGATIVE Final    Comment: (NOTE) SARS-CoV-2 target nucleic acids are NOT DETECTED.  The SARS-CoV-2 RNA is generally detectable in upper respiratoy specimens during the acute phase of infection. The lowest concentration of SARS-CoV-2 viral copies this assay can detect is 131 copies/mL. A negative result does not preclude SARS-Cov-2 infection and should not be used as the sole basis for treatment or other patient management decisions. A negative result may occur with  improper specimen collection/handling, submission of specimen other than nasopharyngeal swab, presence of viral mutation(s) within the areas targeted by this assay, and inadequate number of viral copies (<131 copies/mL). A negative result must be combined with clinical observations, patient history, and epidemiological information. The expected result is Negative.  Fact Sheet for Patients:  https://www.moore.com/  Fact Sheet for Healthcare Providers:  https://www.young.biz/  This test is no t yet approved or cleared by the [place] FDA and  has been authorized for detection and/or diagnosis of SARS-CoV-2 by FDA under an Emergency Use Authorization (EUA). This EUA will remain  in effect (meaning this test can be used) for the duration of the COVID-19 declaration under Section 564(b)(1) of the Act, 21 U.S.C. section 360bbb-3(b)(1), unless the authorization is terminated or revoked sooner.     Influenza A by PCR NEGATIVE NEGATIVE Final   Influenza B by PCR NEGATIVE NEGATIVE Final    Comment: (NOTE) The Xpert Xpress SARS-CoV-2/FLU/RSV assay is intended as an aid in  the diagnosis of influenza from Nasopharyngeal swab  specimens and  should not be used as a sole basis for treatment. Nasal washings and  aspirates are unacceptable for Xpert Xpress SARS-CoV-2/FLU/RSV  testing.  Fact Sheet for Patients: https://www.moore.com/  Fact Sheet for Healthcare Providers: https://www.young.biz/  This test is not yet approved or cleared by the [place] FDA and  has been authorized for detection and/or diagnosis of SARS-CoV-2 by  FDA under an Emergency Use Authorization (EUA). This EUA will remain  in effect (meaning this test can be used) for the duration of the  Covid-19 declaration under Section 564(b)(1) of the Act, 21  U.S.C. section 360bbb-3(b)(1), unless the authorization is  terminated or revoked. Performed at [facility], [address], [place], [place] [address]   MRSA PCR Screening     Status: Abnormal   Collection Time: 09/18/20  6:14 PM   Specimen: Nasopharyngeal  Result Value Ref Range Status   MRSA by PCR POSITIVE (A) NEGATIVE Final    Comment:        The GeneXpert MRSA Assay (FDA approved for NASAL specimens only), is one component of a comprehensive MRSA colonization surveillance program. It is not intended to diagnose MRSA infection nor to guide or monitor treatment for MRSA infections. RESULT CALLED TO, READ BACK BY AND VERIFIED WITH: Roosvelt Harps RN @ 2141 09/18/2020 BY BOWMAN,K  Performed at [facility], [address] [place], [place] [address]      Time coordinating discharge:  35 minutes  SIGNED:   Dorcas Carrow, MD  [facility] 09/19/2020, 1:55 PM

## 2020-09-19 NOTE — Consult Note (Signed)
[facility] Face-to-Face Psychiatry Consult   Reason for Consult: ''Drug overdose.'' Referring Physician:  Dorcas Carrow, MD Patient Identification: Jennifer Harmon MRN:  [id number] Principal Diagnosis: Adjustment disorder with depressed mood Diagnosis:  Principal Problem:   Adjustment disorder with depressed mood Active Problems:   History of anxiety   Overdose of drug   Total Time spent with patient: 1 hour  Subjective:   Jennifer Harmon is a 59 y.o. female patient admitted with loss of consciousness.  HPI: Patient gave permission to be assessed in the presence of her husband:  59 year old  Female who reports history of Anxiety, depression and chronic pain. She was admitted to the hospital after she had a syncopal episode at home and struck her head on the stove. She reports being stable on Celexa and denies trying to overdose on her medications. Her husband states that his wife did not overdose on her medications and has never had any history of suicide attempt. Patient is awake, alert, pleasant, denies psychosis, mood swings, depression, delusions and self harming thoughts.  Past Psychiatric History: as abobe  Risk to Self:  denies Risk to Others:   denies Prior Inpatient Therapy:   Prior Outpatient Therapy:    Past Medical History:  Past Medical History:  Diagnosis Date  . Acute postoperative pain 05/03/2018  . Anxiety   . Chronic chest pain   . Depression   . DJD (degenerative joint disease)   . Fibromyalgia   . GERD (gastroesophageal reflux disease)   . Hypercholesterolemia     Past Surgical History:  Procedure Laterality Date  . BACK SURGERY  2008  . BREAST REDUCTION SURGERY Bilateral 05/25/2020   Procedure: MAMMARY REDUCTION  (BREAST) WITH LIPO;  Surgeon: Allena Napoleon, MD;  Location: [facility] OR;  Service: Plastics;  Laterality: Bilateral;  3 hours please  . CESAREAN SECTION    . CHOLECYSTECTOMY    . JOINT REPLACEMENT Right 04/16/2019   knee   Family History:  Family  History  Problem Relation Age of Onset  . Hypertension Mother   . Arthritis Father    Family Psychiatric  History:  Social History:  Social History   Substance and Sexual Activity  Alcohol Use No     Social History   Substance and Sexual Activity  Drug Use No    Social History   Socioeconomic History  . Marital status: Married    Spouse name: Not on file  . Number of children: Not on file  . Years of education: Not on file  . 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 Smoking status: Former Smoker    Packs/day: 0.50    Years: 3.00    Pack years: 1.50    Types: Pipe, Cigarettes    Quit date: 11/08/2015    Years since quitting: 4.8  . Smokeless tobacco: Never Used  Vaping Use  . Vaping Use: Never used  Substance and Sexual Activity  . Alcohol use: No  . Drug use: No  . Sexual activity: Not on file  Other Topics Concern  . Not on file  Social History Narrative  . Not on file   Social Determinants of Health   Financial Resource Strain:   . Difficulty of Paying Living Expenses: Not on file  Food Insecurity:   . Worried About Programme researcher, broadcasting/film/video in the Last Year: Not on file  .  Ran Out of Food in the Last Year: Not on file  Transportation Needs:   . Lack of Transportation (Medical): Not on file  . Lack of Transportation (Non-Medical): Not on file  Physical Activity:   . Days of Exercise per Week: Not on file  . Minutes of Exercise per Session: Not on file  Stress:   . Feeling of Stress : Not on file  Social Connections:   . Frequency of Communication with Friends and Family: Not on file  . Frequency of Social Gatherings with Friends and Family: Not on file  . Attends Religious Services: Not on file  . Active Member of Clubs or Organizations: Not on file  . Attends Banker Meetings: Not on file  . Marital Status: Not on file   Additional Social  History:    Allergies:   Allergies  Allergen Reactions  . Aspirin Other (See Comments)    Ulcers  . Bupropion     sleepiness  . Ibuprofen Other (See Comments)    ulcers  . Tape     Silk tape tears skin    Labs:  Results for orders placed or performed during the hospital encounter of 09/17/20 (from the past 48 hour(s))  CBG monitoring, ED     Status: Abnormal   Collection Time: 09/17/20  9:54 PM  Result Value Ref Range   Glucose-Capillary 132 (H) 70 - 99 mg/dL    Comment: Glucose reference range applies only to samples taken after fasting for at least 8 hours.  Basic metabolic panel     Status: Abnormal   Collection Time: 09/17/20  9:56 PM  Result Value Ref Range   Sodium 141 135 - 145 mmol/L   Potassium 4.1 3.5 - 5.1 mmol/L   Chloride 103 98 - 111 mmol/L   CO2 27 22 - 32 mmol/L   Glucose, Bld 136 (H) 70 - 99 mg/dL    Comment: Glucose reference range applies only to samples taken after fasting for at least 8 hours.   BUN 16 6 - 20 mg/dL   Creatinine, Ser 1.61 0.44 - 1.00 mg/dL   Calcium 9.6 8.9 - 09.6 mg/dL   GFR, Estimated >04 >54 mL/min    Comment: (NOTE) Calculated using the CKD-EPI Creatinine Equation (2021)    Anion gap 11 5 - 15    Comment: Performed at [facility], [address] [place], [place] [address]  CBC     Status: Abnormal   Collection Time: 09/17/20  9:56 PM  Result Value Ref Range   WBC 11.0 (H) 4.0 - 10.5 K/uL   RBC 4.73 3.87 - 5.11 MIL/uL   Hemoglobin 13.3 12.0 - 15.0 g/dL   HCT 91.4 36 - 46 %   MCV 89.0 80.0 - 100.0 fL   MCH 28.1 26.0 - 34.0 pg   MCHC 31.6 30.0 - 36.0 g/dL   RDW 78.2 95.6 - 21.3 %   Platelets 278 150 - 400 K/uL   nRBC 0.0 0.0 - 0.2 %    Comment: Performed at [facility], [address] [place], [place] [address]  Rapid urine drug screen (hospital performed)     Status: Abnormal   Collection Time: 09/18/20 12:54 AM  Result Value Ref Range   Opiates POSITIVE (A) NONE DETECTED    Cocaine NONE DETECTED NONE DETECTED   Benzodiazepines POSITIVE (A) NONE DETECTED   Amphetamines NONE DETECTED NONE DETECTED   Tetrahydrocannabinol NONE DETECTED NONE DETECTED   Barbiturates NONE  DETECTED NONE DETECTED    Comment: (NOTE) DRUG SCREEN FOR MEDICAL PURPOSES ONLY.  IF CONFIRMATION IS NEEDED FOR ANY PURPOSE, NOTIFY LAB WITHIN 5 DAYS.  LOWEST DETECTABLE LIMITS FOR URINE DRUG SCREEN Drug Class                     Cutoff (ng/mL) Amphetamine and metabolites    1000 Barbiturate and metabolites    200 Benzodiazepine                 200 Tricyclics and metabolites     300 Opiates and metabolites        300 Cocaine and metabolites        300 THC                            50 Performed at [facility], [address], [place], [place] [address]   Troponin I (High Sensitivity)     Status: None   Collection Time: 09/18/20 12:55 AM  Result Value Ref Range   Troponin I (High Sensitivity) <2 <18 ng/L    Comment: (NOTE) Elevated high sensitivity troponin I (hsTnI) values and significant  changes across serial measurements may suggest ACS but many other  chronic and acute conditions are known to elevate hsTnI results.  Refer to the "Links" section for chest pain algorithms and additional  guidance. Performed at [facility], [address] [place], [place] [address]   Urinalysis, Routine w reflex microscopic     Status: Abnormal   Collection Time: 09/18/20 12:55 AM  Result Value Ref Range   Color, Urine YELLOW YELLOW   APPearance CLEAR CLEAR   Specific Gravity, Urine 1.026 1.005 - 1.030   pH 5.0 5.0 - 8.0   Glucose, UA NEGATIVE NEGATIVE mg/dL   Hgb urine dipstick NEGATIVE NEGATIVE   Bilirubin Urine NEGATIVE NEGATIVE   Ketones, ur NEGATIVE NEGATIVE mg/dL   Protein, ur 30 (A) NEGATIVE mg/dL   Nitrite NEGATIVE NEGATIVE   Leukocytes,Ua NEGATIVE NEGATIVE   RBC / HPF 0-5 0 - 5 RBC/hpf   Bacteria, UA NONE SEEN NONE SEEN   Mucus PRESENT      Comment: Performed at [facility], [address] [place], [place] [address]  Acetaminophen level     Status: Abnormal   Collection Time: 09/18/20 12:55 AM  Result Value Ref Range   Acetaminophen (Tylenol), Serum <10 (L) 10 - 30 ug/mL    Comment: (NOTE) Therapeutic concentrations vary significantly. A range of 10-30 ug/mL  may be an effective concentration for many patients. However, some  are best treated at concentrations outside of this range. Acetaminophen concentrations >150 ug/mL at 4 hours after ingestion  and >50 ug/mL at 12 hours after ingestion are often associated with  toxic reactions.  Performed at [facility], [address], [place], [place] [address]   Salicylate level     Status: Abnormal   Collection Time: 09/18/20 12:55 AM  Result Value Ref Range   Salicylate Lvl <7.0 (L) 7.0 - 30.0 mg/dL    Comment: Performed at [facility], [address], [place], [place] [address]  Blood gas, arterial (at [facility])     Status: Abnormal   Collection Time: 09/18/20  1:10 AM  Result Value Ref Range   FIO2 21.00    pH, Arterial 7.400 7.35 - 7.45   pCO2 arterial 46.8 32 - 48  mmHg   pO2, Arterial 80.5 (L) 83 - 108 mmHg   Bicarbonate 28.5 (H) 20.0 - 28.0 mmol/L   Acid-Base Excess 3.4 (H) 0.0 - 2.0 mmol/L   O2 Saturation 96.1 %   Patient temperature 97.8    Allens test (pass/fail) PASS PASS    Comment: Performed at [facility], [address], [place], [place] [address]  Ethanol     Status: None   Collection Time: 09/18/20  1:30 AM  Result Value Ref Range   Alcohol, Ethyl (B) <10 <10 mg/dL    Comment: (NOTE) Lowest detectable limit for serum alcohol is 10 mg/dL.  For medical purposes only. Performed at [facility], [address] [place], [place] [address]   Troponin I (High Sensitivity)     Status: None   Collection Time: 09/18/20  3:03 AM  Result Value Ref Range    Troponin I (High Sensitivity) <2 <18 ng/L    Comment: (NOTE) Elevated high sensitivity troponin I (hsTnI) values and significant  changes across serial measurements may suggest ACS but many other  chronic and acute conditions are known to elevate hsTnI results.  Refer to the "Links" section for chest pain algorithms and additional  guidance. Performed at [facility], [address], [place], [place] [address]   Respiratory Panel by RT PCR (Flu A&B, Covid) - Nasopharyngeal Swab     Status: None   Collection Time: 09/18/20  3:03 AM   Specimen: Nasopharyngeal Swab  Result Value Ref Range   SARS Coronavirus 2 by RT PCR NEGATIVE NEGATIVE    Comment: (NOTE) SARS-CoV-2 target nucleic acids are NOT DETECTED.  The SARS-CoV-2 RNA is generally detectable in upper respiratoy specimens during the acute phase of infection. The lowest concentration of SARS-CoV-2 viral copies this assay can detect is 131 copies/mL. A negative result does not preclude SARS-Cov-2 infection and should not be used as the sole basis for treatment or other patient management decisions. A negative result may occur with  improper specimen collection/handling, submission of specimen other than nasopharyngeal swab, presence of viral mutation(s) within the areas targeted by this assay, and inadequate number of viral copies (<131 copies/mL). A negative result must be combined with clinical observations, patient history, and epidemiological information. The expected result is Negative.  Fact Sheet for Patients:  https://www.moore.com/  Fact Sheet for Healthcare Providers:  https://www.young.biz/  This test is no t yet approved or cleared by the [place] FDA and  has been authorized for detection and/or diagnosis of SARS-CoV-2 by FDA under an Emergency Use Authorization (EUA). This EUA will remain  in effect (meaning this test can be used) for the duration of  the COVID-19 declaration under Section 564(b)(1) of the Act, 21 U.S.C. section 360bbb-3(b)(1), unless the authorization is terminated or revoked sooner.     Influenza A by PCR NEGATIVE NEGATIVE   Influenza B by PCR NEGATIVE NEGATIVE    Comment: (NOTE) The Xpert Xpress SARS-CoV-2/FLU/RSV assay is intended as an aid in  the diagnosis of influenza from Nasopharyngeal swab specimens and  should not be used as a sole basis for treatment. Nasal washings and  aspirates are unacceptable for Xpert Xpress SARS-CoV-2/FLU/RSV  testing.  Fact Sheet for Patients: https://www.moore.com/  Fact Sheet for Healthcare Providers: https://www.young.biz/  This test is not yet approved or cleared by the [place] FDA and  has been authorized for detection and/or diagnosis of SARS-CoV-2 by  FDA under an Emergency Use Authorization (EUA). This EUA will  remain  in effect (meaning this test can be used) for the duration of the  Covid-19 declaration under Section 564(b)(1) of the Act, 21  U.S.C. section 360bbb-3(b)(1), unless the authorization is  terminated or revoked. Performed at [facility], [address] [place], [place] [address]   HIV Antibody (routine testing w rflx)     Status: None   Collection Time: 09/18/20  5:20 AM  Result Value Ref Range   HIV Screen 4th Generation wRfx Non Reactive Non Reactive    Comment: Performed at [facility], [address], [place], [place] [address]  CBC     Status: None   Collection Time: 09/18/20  5:20 AM  Result Value Ref Range   WBC 8.4 4.0 - 10.5 K/uL   RBC 4.42 3.87 - 5.11 MIL/uL   Hemoglobin 12.3 12.0 - 15.0 g/dL   HCT 16.3 36 - 46 %   MCV 88.0 80.0 - 100.0 fL   MCH 27.8 26.0 - 34.0 pg   MCHC 31.6 30.0 - 36.0 g/dL   RDW 84.6 65.9 - 93.5 %   Platelets 267 150 - 400 K/uL   nRBC 0.0 0.0 - 0.2 %    Comment: Performed at [facility], [address]  [place], [place] [address]  Basic metabolic panel     Status: Abnormal   Collection Time: 09/18/20  5:20 AM  Result Value Ref Range   Sodium 143 135 - 145 mmol/L   Potassium 4.0 3.5 - 5.1 mmol/L   Chloride 109 98 - 111 mmol/L   CO2 25 22 - 32 mmol/L   Glucose, Bld 153 (H) 70 - 99 mg/dL    Comment: Glucose reference range applies only to samples taken after fasting for at least 8 hours.   BUN 14 6 - 20 mg/dL   Creatinine, Ser 9.39 0.44 - 1.00 mg/dL   Calcium 9.3 8.9 - 03.0 mg/dL   GFR, Estimated >09 >23 mL/min    Comment: (NOTE) Calculated using the CKD-EPI Creatinine Equation (2021)    Anion gap 9 5 - 15    Comment: Performed at [facility], [address] [place], [place] [address]  Magnesium     Status: None   Collection Time: 09/18/20  5:20 AM  Result Value Ref Range   Magnesium 2.1 1.7 - 2.4 mg/dL    Comment: Performed at [facility], [address] [place], [place] [address]  Phosphorus     Status: None   Collection Time: 09/18/20  5:20 AM  Result Value Ref Range   Phosphorus 3.3 2.5 - 4.6 mg/dL    Comment: Performed at [facility], [address] [place], [place] [address]  MRSA PCR Screening     Status: Abnormal   Collection Time: 09/18/20  6:14 PM   Specimen: Nasopharyngeal  Result Value Ref Range   MRSA by PCR POSITIVE (A) NEGATIVE    Comment:        The GeneXpert MRSA Assay (FDA approved for NASAL specimens only), is one component of a comprehensive MRSA colonization surveillance program. It is not intended to diagnose MRSA infection nor to guide or monitor treatment for MRSA infections. RESULT CALLED TO, READ BACK BY AND VERIFIED WITH: Roosvelt Harps RN @ 2141 09/18/2020 BY BOWMAN,K  Performed at [facility] [address] [place], [place] [address]   CBC     Status: None   Collection Time: 09/19/20  3:06  AM  Result Value Ref Range   WBC 10.3 4.0 - 10.5 K/uL   RBC 4.63 3.87 - 5.11  MIL/uL   Hemoglobin 12.8 12.0 - 15.0 g/dL   HCT 69.6 36 - 46 %   MCV 89.6 80.0 - 100.0 fL   MCH 27.6 26.0 - 34.0 pg   MCHC 30.8 30.0 - 36.0 g/dL   RDW 29.5 28.4 - 13.2 %   Platelets 268 150 - 400 K/uL   nRBC 0.0 0.0 - 0.2 %    Comment: Performed at [facility], [address] [place], [place] [address]  Comprehensive metabolic panel     Status: Abnormal   Collection Time: 09/19/20  3:06 AM  Result Value Ref Range   Sodium 142 135 - 145 mmol/L   Potassium 3.6 3.5 - 5.1 mmol/L   Chloride 104 98 - 111 mmol/L   CO2 27 22 - 32 mmol/L   Glucose, Bld 154 (H) 70 - 99 mg/dL    Comment: Glucose reference range applies only to samples taken after fasting for at least 8 hours.   BUN 12 6 - 20 mg/dL   Creatinine, Ser 2.72 0.44 - 1.00 mg/dL   Calcium 9.8 8.9 - 53.6 mg/dL   Total Protein 7.5 6.5 - 8.1 g/dL   Albumin 4.3 3.5 - 5.0 g/dL   AST 24 15 - 41 U/L   ALT 33 0 - 44 U/L   Alkaline Phosphatase 123 38 - 126 U/L   Total Bilirubin 0.8 0.3 - 1.2 mg/dL   GFR, Estimated >64 >40 mL/min    Comment: (NOTE) Calculated using the CKD-EPI Creatinine Equation (2021)    Anion gap 11 5 - 15    Comment: Performed at [facility], [address], [place], [place] [address]    Current Facility-Administered Medications  Medication Dose Route Frequency Provider Last Rate Last Admin  . acetaminophen (TYLENOL) tablet 500 mg  500 mg Oral Q8H PRN Dorcas Carrow, MD      . calcium carbonate (OS-CAL - dosed in mg of elemental calcium) tablet 500 mg of elemental calcium  1 tablet Oral BID WC Dorcas Carrow, MD   500 mg of elemental calcium at 09/19/20 1100  . Chlorhexidine Gluconate Cloth 2 % PADS 6 each  6 each Topical Q0600 Kirtland Bouchard, MD   6 each at 09/19/20 1101  . citalopram (CELEXA) tablet 20 mg  20 mg Oral Daily Dorcas Carrow, MD   20 mg at 09/19/20 1059  . clonazePAM (KLONOPIN) tablet 1 mg  1 mg Oral TID PRN Dorcas Carrow, MD      . cyclobenzaprine  (FLEXERIL) tablet 10 mg  10 mg Oral TID Dorcas Carrow, MD   10 mg at 09/19/20 1059  . diclofenac Sodium (VOLTAREN) 1 % topical gel 2 g  2 g Topical Q8H PRN Karl Ito, MD   2 g at 09/19/20 1101  . diphenhydrAMINE (BENADRYL) capsule 25 mg  25 mg Oral Q6H PRN Dorcas Carrow, MD      . docusate sodium (COLACE) capsule 100 mg  100 mg Oral BID PRN Kirtland Bouchard, MD      . enoxaparin (LOVENOX) injection 40 mg  40 mg Subcutaneous Q24H Kirtland Bouchard, MD   40 mg at 09/19/20 1100  . famotidine (PEPCID) tablet 40 mg  40 mg Oral Daily Dorcas Carrow, MD   40 mg at 09/19/20 1059  . latanoprost (XALATAN) 0.005 % ophthalmic solution 1 drop  1  drop Both Eyes QHS Dorcas Carrow, MD      . magnesium oxide (MAG-OX) tablet 400 mg  400 mg Oral QHS Dorcas Carrow, MD      . multivitamin with minerals tablet 1 tablet  1 tablet Oral Daily Dorcas Carrow, MD   1 tablet at 09/19/20 1059  . mupirocin ointment (BACTROBAN) 2 % 1 application  1 application Nasal BID Kirtland Bouchard, MD   1 application at 09/19/20 1117  . ondansetron (ZOFRAN) injection 4 mg  4 mg Intravenous Q6H PRN Kirtland Bouchard, MD      . pantoprazole (PROTONIX) injection 40 mg  40 mg Intravenous QHS Kirtland Bouchard, MD   40 mg at 09/18/20 2212  . polyethylene glycol (MIRALAX / GLYCOLAX) packet 17 g  17 g Oral Daily PRN Kirtland Bouchard, MD      . polyvinyl alcohol (LIQUIFILM TEARS) 1.4 % ophthalmic solution 1 drop  1 drop Both Eyes PRN Dorcas Carrow, MD      . pregabalin (LYRICA) capsule 150 mg  150 mg Oral TID Dorcas Carrow, MD   150 mg at 09/19/20 1059    Musculoskeletal: Strength & Muscle Tone: not tested Gait & Station: not tested Patient leans: N/A  Psychiatric Specialty Exam: Physical Exam Psychiatric:        Attention and Perception: Attention and perception normal.        Mood and Affect: Mood and affect normal.        Speech: Speech normal.        Behavior: Behavior normal. Behavior is  cooperative.        Thought Content: Thought content normal.        Cognition and Memory: Cognition and memory normal.        Judgment: Judgment normal.     Review of Systems  Constitutional: Negative.   HENT: Negative.   Eyes: Negative.   Psychiatric/Behavioral: Negative.     Blood pressure (!) 153/63, pulse 89, temperature 98.2 F (36.8 C), temperature source Oral, resp. rate 20, height  (1.6 m), weight 91.3 kg, SpO2 97 %.Body mass index is 35.66 kg/m.  General Appearance: Casual  Eye Contact:  Good  Speech:  Clear and Coherent  Volume:  Normal  Mood:  Euthymic  Affect:  Appropriate  Thought Process:  Coherent, Goal Directed and Linear  Orientation:  Full (Time, Place, and Person)  Thought Content:  Logical  Suicidal Thoughts:  No  Homicidal Thoughts:  No  Memory:  Immediate;   Good Recent;   Good Remote;   Fair  Judgement:  Intact  Insight:  Fair  Psychomotor Activity:  Normal  Concentration:  Concentration: Good and Attention Span: Good  Recall:  Fair  Fund of Knowledge:  Good  Language:  Good  Akathisia:  No  Handed:  Right  AIMS (if indicated):     Assets:  Communication Skills Desire for Improvement Housing Social Support  ADL's:  Intact  Cognition:  WNL  Sleep:   fair to good     Treatment Plan Summary: 59 year old female who was admitted due to loss of consciousness following a syncopal attacks. Patient denies drug overdose, depression, psychosis, delusions and self harming thoughts. Patient is cleared by psychiatric service.  Disposition: No evidence of imminent risk to self or others at present.   Patient does not meet criteria for psychiatric inpatient admission. Supportive therapy provided about ongoing stressors. Psychiatric service signing out. Re-consult as needed  Thedore Mins, MD 09/19/2020 2:04  PM

## 2020-09-21 ENCOUNTER — Telehealth: Payer: Self-pay | Admitting: Pain Medicine

## 2020-09-21 NOTE — Telephone Encounter (Signed)
Patient became over medicated on Thurs. Had to be taken to the hospital and was kept until Saturday at [facility]. She had climbed up on chair to get something and fell. The hospital took her off Oxycodone and baclofen, too much tylenol in system, and some others. Told her not to take anymore until she talks to physician. Husband is very worried about her, but she seems to be doing better. Her head hurts where she hit it on the stove. Please call to discuss what happened and what to do about medications.   His number is  [phone]

## 2020-09-21 NOTE — Telephone Encounter (Signed)
Spoke with Mr Barlett, he states that Jennifer Harmon is coming along, her head still hurts and her neck.  There is no wound, thinks she just hit her head pretty hard.  The provider that was taking care of her took her off her pain medications.  Mr Vandermeer reports that she is not worse without them.  States that when she climbed up on the chair she passed out.  He states that she was overmedicated and they were unable to wake her up, narcan was given.  He reports that she would never over medicate on purpose but that she has become very forgetful about lots of things.  I told him that we would need to schedule appt with Dr Laban Emperor to discuss how to proceed with her care.  Mr Speece is agreeable to this and was transferred up to [facility] for scheduling.

## 2020-09-22 ENCOUNTER — Encounter: Payer: Self-pay | Admitting: Surgical

## 2020-09-22 ENCOUNTER — Ambulatory Visit (INDEPENDENT_AMBULATORY_CARE_PROVIDER_SITE_OTHER): Payer: PPO | Admitting: Surgical

## 2020-09-22 ENCOUNTER — Other Ambulatory Visit: Payer: Self-pay

## 2020-09-22 VITALS — BP 123/75 | HR 85 | Temp 98.5°F

## 2020-09-22 DIAGNOSIS — F322 Major depressive disorder, single episode, severe without psychotic features: Secondary | ICD-10-CM | POA: Diagnosis not present

## 2020-09-22 DIAGNOSIS — Z9889 Other specified postprocedural states: Secondary | ICD-10-CM | POA: Diagnosis not present

## 2020-09-22 DIAGNOSIS — T50914D Poisoning by multiple unspecified drugs, medicaments and biological substances, undetermined, subsequent encounter: Secondary | ICD-10-CM | POA: Diagnosis not present

## 2020-09-22 DIAGNOSIS — M546 Pain in thoracic spine: Secondary | ICD-10-CM | POA: Diagnosis not present

## 2020-09-22 DIAGNOSIS — G8929 Other chronic pain: Secondary | ICD-10-CM

## 2020-09-22 DIAGNOSIS — Z09 Encounter for follow-up examination after completed treatment for conditions other than malignant neoplasm: Secondary | ICD-10-CM | POA: Diagnosis not present

## 2020-09-22 DIAGNOSIS — Z299 Encounter for prophylactic measures, unspecified: Secondary | ICD-10-CM | POA: Diagnosis not present

## 2020-09-22 DIAGNOSIS — I10 Essential (primary) hypertension: Secondary | ICD-10-CM | POA: Diagnosis not present

## 2020-09-22 NOTE — Progress Notes (Signed)
Patient is a 59 year old female here for follow-up after bilateral breast reduction with Dr. Arita Miss on 05/25/2020.  Patient underwent breast reduction via free nipple graft.  She had 3390 g removed from the right breast and 3725 g removed from the left breast.  She reports that overall she is doing well, she reports that she had an episode a few days ago where she had taken too much of her medication and fell and hit her head.  She reports that she is doing better after this.  She is here with her husband today.  She is interested in discussing options for weight loss and dieting.  Chaperone present on exam On exam bilateral breast incisions intact, bilateral NAC's with new darkening pigmentation noted.  No wounds noted.  Bilateral breasts are soft.  Scarring is healing nicely.  There is no swelling noted.  No fluid wave noted with palpation.  No areas of fat necrosis noted with palpation.  Discussed with patient she has no restrictions at this time, she can wear normal bra at this time, avoid bra with underwire. In regards to follow-up, we discussed following up as needed.  Patient is comfortable with this.  I recommend she call us with any questions or concerns and we would be happy to see her in follow-up to evaluate.  I discussed with the patient that I would be happy to refer her to healthy weight and wellness as they may be able to provide her with weight loss assistance.

## 2020-09-22 NOTE — Progress Notes (Signed)
PROVIDER NOTE: Information contained herein reflects review and annotations entered in association with encounter. Interpretation of such information and data should be left to medically-trained personnel. Information provided to patient can be located elsewhere in the medical record under "Patient Instructions". Document created using STT-dictation technology, any transcriptional errors that may result from process are unintentional.    Patient: Jennifer Harmon  Service Category: E/M  Provider: Gaspar Cola, MD  DOB: [birth year]  DOS: 09/23/2020  Specialty: Interventional Pain Management  MRN: [id number]  Setting: Ambulatory outpatient  PCP: Glenda Chroman, MD  Type: Established Patient    Referring Provider: Glenda Chroman, MD  Location: Office  Delivery: Face-to-face     HPI  [name], a 59 y.o. year old female, is here today because of her Chronic pain syndrome [G89.4]. Jennifer Harmon primary complain today is Neck Pain Last encounter: My last encounter with her was on 09/21/2020. Pertinent problems: Jennifer Harmon has Fibromyalgia; DDD (degenerative disc disease), lumbar; S/P partial knee replacement (Left); S/P total knee replacement (Right); Osteoarthritis of feet (Bilateral); Chronic inflammatory arthritis; History of knee arthroplasty (Bilateral); Osteoarthritis of knee (Right); Symptomatic mammary hypertrophy; Chronic upper back pain (1ry area of Pain) (Bilateral) (R>L); Chronic neck pain (3ry area of Pain) (Bilateral) (R>L); Chronic shoulder pain (Bilateral) (R>L); Chronic upper extremity pain (Bilateral) (R>L); Chronic pain syndrome; Chronic sacroiliac joint pain (Bilateral) (R>L); Chronic low back pain (2ry area of Pain) (Bilateral) (R>L); Chronic knee pain (4th area of Pain) (Bilateral) (R>L); Neurogenic pain; Chronic musculoskeletal pain; Chronic recurrent lower extremity muscle spasms (Bilateral); Thoracic radiculopathy due to osteoarthritis of spine; Thoracic radiculitis (T6/T7)  (Right); Chronic lower extremity pain (Bilateral) (R>L); Chronic lower extremity radicular pain (L5) (Right); DISH (diffuse idiopathic skeletal hyperostosis); 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; DDD (degenerative disc disease), thoracic; Failed back surgical syndrome; Fusion of lumbar spine; Lumbar facet syndrome (Bilateral) (R>L); Lumbar paraspinal muscle spasm; Spondylosis without myelopathy or radiculopathy, lumbosacral region; Chronic hip pain (Bilateral) (R>L); Other specified dorsopathies, sacral and sacrococcygeal region; Osteoarthritis of hip (Bilateral) (R>L); Thoracic spondylosis with radiculopathy; Pain due to total right knee replacement ([place]); Chronic knee pain after total replacement (TKR) (Right); DDD (degenerative disc disease), cervical; Cervical spondylosis with radiculopathy (Right); Night cramps; S/P Revision of total knee replacement (Right); Abnormal MRI, cervical spine (04/12/2019); Trigger point with back pain; Acute exacerbation of chronic low back pain; Abnormal MRI, lumbar spine (02/18/2020); Lumbar central spinal stenosis, w/ neurogenic claudication (L3-4); Lumbar foraminal stenosis (Right) (L3-4); Cervicalgia; Cervical facet syndrome (Right); and Spondylosis without myelopathy or radiculopathy, cervical region on their pertinent problem list. Pain Assessment: Severity of Chronic pain is reported as a 6 /10. Location: Neck Lower, Mid/Denies. Onset: More than a month ago. Quality: Constant, Stabbing, Squeezing, Spasm. Timing: Constant. Modifying factor(s): meds, volteran gel. Vitals:  height is '5\' 3"'  (1.6 m) and weight is 191 lb (86.6 kg). Her temperature is 97.4 F (36.3 C) (abnormal). Her blood pressure is 128/61 and her pulse is 89. Her oxygen saturation is 100%.   Reason for encounter: post-procedure assessment.  Unfortunately, when I went into getting the information from the procedure, it was clear to me that the patient was not really understanding what we were  looking for.  Today I spent quite some time going over the information that she needed to record, the purpose of the procedure, the purpose of the local anesthetics and the steroids, and how she needed to record information every hour on the hour so that it would  be accurate and she would not forget secondary to the use of intravenous amnesics (versed).  Once I explained everything to her, and the importance of this information, she admitted that she had done a very poor job at keeping track of this.  Unfortunately, what that means is that everything that we did was for nothing in terms of the diagnostic part of the injection.  Because of this, today we have decided to repeat the same procedure and again find out if we can get some useful information from it.  She understood and accepted.  In addition to this, we also had a very long talk with regards to the events that transpired on 09/17/2020 when she ended up in the emergency room due to an overdose.  According to the patient and her husband, apparently she climbed onto a cabinet reaching for something and then passed out and fell hitting her head on her way down.  She was taken to the ED where she was diagnosed as having had an unintentional overdose of medication.  Today I have reviewed the lab work as well as the drug levels and the results would suggest that the patient is hepatic and kidney function are within normal limits and therefore unlikely that any of the medication accumulated secondary to a problem related to the liver or kidney.  This would suggest that either excessive consumption of the medication or a drug to drug interaction occurred.  Acetaminophen levels were not elevated.  Salicylic acid levels were also not elevated.  There was no alcohol in her system.  However urine drug screen test did test positive for benzodiazepines and opioids.  The husband indicates that it could have been that she might have not remember the last time that she  had taken some medicine and perhaps to some additional medicine and this is how this event took place.  When she reached the hospital she was given an initial single dose of Narcan which did not reverse the effects and she had to be placed on an infusion.  This event has highlighted the fact that she has not been paying attention to the information that we have been giving her any she furthermore has not been reading information that we have given her regarding these medications and how to prevent things like this.  Each one of her prescriptions has a statement indicating that she should not be taking any opioids within 8 hours of having taken a benzodiazepine and also not taking any benzodiazepines within 8 hours of having taken the opioids.  Clearly this was not followed.  In any case, the patient indicates that her primary has discontinue the use of the benzodiazepines and we will be discontinuing the use of the opioid analgesics.  The patient has not had any since that event on 09/17/2020 and the husband and the patient both admit that the pain has really not been any worse while being off of them, in fact she actually feels that it may be better.  This could be explained by the possibility of opioid-induced hyperalgesia.  In any case, we will not be restarting the opioids.  The patient was informed of this today.  RTCB: 01/05/2021  Nonopioids transfer 08/26/2020   Post-Procedure Evaluation  Procedure (09/08/2020): Diagnostic right cervical facet block #1 under fluoroscopic guidance and IV sedation Pre-procedure pain level: 8/10 Post-procedure: 0/10 (100% relief)  Sedation: Sedation provided.  Effectiveness during initial hour after procedure(Ultra-Short Term Relief): 100 %.  Local anesthetic used: Long-acting (4-6  hours) Effectiveness: Defined as any analgesic benefit obtained secondary to the administration of local anesthetics. This carries significant diagnostic value as to the etiological  location, or anatomical origin, of the pain. Duration of benefit is expected to coincide with the duration of the local anesthetic used.  Effectiveness during initial 4-6 hours after procedure(Short-Term Relief): 0 % (by the time she got home the pain was back to where it was before).  Long-term benefit: Defined as any relief past the pharmacologic duration of the local anesthetics.  Effectiveness past the initial 6 hours after procedure(Long-Term Relief): 0 % (no relief).  Interpretation: Based on the conversation that I had with the patient today, the above results collected are non-valid.  We will need to repeat the diagnostic injection.  Pharmacotherapy Assessment   Analgesic: Opioid analgesics DISCONTINUED secondary to unintentional overdose on 09/17/2020. MME/day: 0 mg/day.   Monitoring: [place] PMP: PDMP reviewed during this encounter.       Pharmacotherapy: No side-effects or adverse reactions reported. Compliance: No problems identified. Effectiveness: Clinically acceptable.  Chauncey Fischer, RN  09/23/2020 10:05 AM  Sign when Signing Visit Nursing Pain Medication Assessment:  Safety precautions to be maintained throughout the outpatient stay will include: orient to surroundings, keep bed in low position, maintain call bell within reach at all times, provide assistance with transfer out of bed and ambulation.  Medication Inspection Compliance: Ms. Juneau did not comply with our request to bring her pills to be counted. She was reminded that bringing the medication bottles, even when empty, is a requirement.  Medication: None brought in. Pill/Patch Count: None available to be counted. Bottle Appearance: No container available. Did not bring bottle(s) to appointment. Filled Date: N/A Last Medication intake:  not taking anymoreSafety precautions to be maintained throughout the outpatient stay will include: orient to surroundings, keep bed in low position, maintain call bell within reach  at all times, provide assistance with transfer out of bed and ambulation.     UDS:  Summary  Date Value Ref Range Status  03/05/2020 Note  Final    Comment:    ==================================================================== [employer] (MW) ==================================================================== Test                             Result       Flag       Units Drug Present   7-aminoclonazepam              817                     ng/mg creat    7-aminoclonazepam is an expected metabolite of clonazepam. Source of    clonazepam is a scheduled prescription medication.   Oxycodone                      1431                    ng/mg creat   Oxymorphone                    806                     ng/mg creat   Noroxycodone                   2398  ng/mg creat   Noroxymorphone                 150                     ng/mg creat    Sources of oxycodone are scheduled prescription medications.    Oxymorphone, noroxycodone, and noroxymorphone are expected    metabolites of oxycodone. Oxymorphone is also available as a    scheduled prescription medication. ==================================================================== Test                      Result    Flag   Units      Ref Range   Creatinine              52               mg/dL      >=20 ==================================================================== Declared Medications:  Medication list was not provided. ==================================================================== For clinical consultation, please call [phone]. ====================================================================      ROS  Constitutional: Denies any fever or chills Gastrointestinal: No reported hemesis, hematochezia, vomiting, or acute GI distress Musculoskeletal: Denies any acute onset joint swelling, redness, loss of ROM, or weakness Neurological: No reported episodes of acute onset apraxia, aphasia,  dysarthria, agnosia, amnesia, paralysis, loss of coordination, or loss of consciousness  Medication Review  Magnesium Oxide, Multi-Vitamin, calcium carbonate, citalopram, clonazePAM, cyclobenzaprine, diclofenac Sodium, esomeprazole, famotidine, latanoprost, polyvinyl alcohol, pregabalin, and rosuvastatin  History Review  Allergy: Ms. Echavarria is allergic to aspirin, bupropion, ibuprofen, and tape. Drug: Ms. Golomb  reports no history of drug use. Alcohol:  reports no history of alcohol use. Tobacco:  reports that she quit smoking about 4 years ago. Her smoking use included pipe and cigarettes. She has a 1.50 pack-year smoking history. She has never used smokeless tobacco. Social: Ms. Borgeson  reports that she quit smoking about 4 years ago. Her smoking use included pipe and cigarettes. She has a 1.50 pack-year smoking history. She has never used smokeless tobacco. She reports that she does not drink alcohol and does not use drugs. Medical:  has a past medical history of Acute postoperative pain (05/03/2018), Anxiety, Chronic chest pain, Depression, DJD (degenerative joint disease), Fibromyalgia, GERD (gastroesophageal reflux disease), and Hypercholesterolemia. Surgical: Ms. Adcox  has a past surgical history that includes Cholecystectomy; Cesarean section; Back surgery (2008); Joint replacement (Right, 04/16/2019); and Breast reduction surgery (Bilateral, 05/25/2020). Family: family history includes Arthritis in her father; Hypertension in her mother.  Laboratory Chemistry Profile   Renal Lab Results  Component Value Date   BUN 12 09/19/2020   CREATININE 0.64 09/19/2020   GFRAA >60 11/05/2019   GFRNONAA >60 09/19/2020     Hepatic Lab Results  Component Value Date   AST 24 09/19/2020   ALT 33 09/19/2020   ALBUMIN 4.3 09/19/2020   ALKPHOS 123 09/19/2020     Electrolytes Lab Results  Component Value Date   NA 142 09/19/2020   K 3.6 09/19/2020   CL 104 09/19/2020   CALCIUM 9.8  09/19/2020   MG 2.1 09/18/2020   PHOS 3.3 09/18/2020     Bone Lab Results  Component Value Date   VD25OH 21.15 (L) 11/05/2019     Inflammation (CRP: Acute Phase) (ESR: Chronic Phase) Lab Results  Component Value Date   CRP 0.6 11/05/2019   ESRSEDRATE 21 11/05/2019       Note: Above Lab results reviewed.  Recent Imaging Review  DG Abd 1  View CLINICAL DATA:  Over does and syncope  EXAM: ABDOMEN - 1 VIEW  COMPARISON:  None.  FINDINGS: Large volume stool retention without rectal loading. No small bowel dilatation. No concerning mass effect or calcification. Lumbar fusion and cholecystectomy clips. Bilateral hip osteoarthritis.  IMPRESSION: Constipated appearance.  Electronically Signed   By: Monte Fantasia M.D.   On: 09/18/2020 04:42 CT Head Wo Contrast CLINICAL DATA:  Fall with head trauma  EXAM: CT HEAD WITHOUT CONTRAST  TECHNIQUE: Contiguous axial images were obtained from the base of the skull through the vertex without intravenous contrast.  COMPARISON:  None.  FINDINGS: Brain: There is no mass, hemorrhage or extra-axial collection. The size and configuration of the ventricles and extra-axial CSF spaces are normal. The brain parenchyma is normal, without acute or chronic infarction.  Vascular: No abnormal hyperdensity of the major intracranial arteries or dural venous sinuses. No intracranial atherosclerosis.  Skull: The visualized skull base, calvarium and extracranial soft tissues are normal.  Sinuses/Orbits: No fluid levels or advanced mucosal thickening of the visualized paranasal sinuses. No mastoid or middle ear effusion. The orbits are normal.  IMPRESSION: Normal head CT.  Electronically Signed   By: Ulyses Jarred M.D.   On: 09/18/2020 00:23 Note: Reviewed        Physical Exam  General appearance: Well nourished, well developed, and well hydrated. In no apparent acute distress Mental status: Alert, oriented x 3 (person, place, &  time)       Respiratory: No evidence of acute respiratory distress Eyes: PERLA Vitals: BP 128/61   Pulse 89   Temp (!) 97.4 F (36.3 C)   Ht '5\' 3"'  (1.6 m)   Wt 191 lb (86.6 kg)   SpO2 100%   BMI 33.83 kg/m  BMI: Estimated body mass index is 33.83 kg/m as calculated from the following:   Height as of this encounter: '5\' 3"'  (1.6 m).   Weight as of this encounter: 191 lb (86.6 kg). Ideal: Ideal body weight: 52.4 kg (115 lb 8.3 oz) Adjusted ideal body weight: 66.1 kg (145 lb 11.4 oz)  Assessment   Status Diagnosis  Controlled Controlled Controlled 1. Chronic pain syndrome   2. Cervical facet syndrome (Right)   3. Cervicalgia   4. Chronic upper back pain (1ry area of Pain) (Bilateral) (R>L)   5. Chronic low back pain (2ry area of Pain) (Bilateral) (R>L)   6. Pharmacologic therapy   7. Uncomplicated opioid dependence ([place])   8. Abnormal MRI, cervical spine   9. Drug overdose, accidental or unintentional, sequela      Updated Problems: Problem  Abnormal MRI, cervical spine (04/12/2019)   FINDINGS: Alignment: Mild straightening of the normal cervical lordosis. Vertebrae: Mild degenerative endplate reactive changes but no bone lesions or fracture. Cord: Normal MR appearance of the cervical spinal cord. No cord lesions or syrinx. Posterior Fossa, vertebral arteries, paraspinal tissues: Moderate degenerative changes at C1-2 but no significant pannus formation.  Disc levels: C2-3: No significant findings. C3-4: Mild degenerative disc disease and right-sided facet disease but no disc protrusions, spinal or foraminal stenosis. C4-5: Bulging annulus and mild osteophytic ridging with slight flattening of the ventral thecal sac and slight narrowing the ventral CSF space. There is also right-sided uncinate spurring change contributing to mild right foraminal narrowing. C5-6: Mild annular bulge but no spinal stenosis. Uncinate spurring changes bilaterally contributing to mild bilateral  foraminal narrowing C6-7: Degenerative disc disease with a slight bulging annulus but no spinal stenosis. Right-sided disc osteophyte complex contributing  to mild right foraminal stenosis. C7-T1: No significant findings.  IMPRESSION: 1. Mild-to-moderate degenerative cervical spondylosis with disc disease and facet disease. 2. Mild right foraminal stenosis at C4-5 and C6-7. 3. Mild bilateral foraminal stenosis at C5-6.   Drug Overdose, Accidental Or Unintentional, [place]   The patient was admitted to the ED on 09/17/2020 secondary to a drug overdose.     Plan of Care  Problem-specific:  No problem-specific Assessment & Plan notes found for this encounter.  [name] has a current medication list which includes the following long-term medication(s): calcium carbonate, clonazepam, cyclobenzaprine, esomeprazole, magnesium oxide, pregabalin, and rosuvastatin.  Pharmacotherapy (Medications Ordered): No orders of the defined types were placed in this encounter.  Orders:  Orders Placed This Encounter  Procedures  . CERVICAL FACET (MEDIAL BRANCH NERVE BLOCK)     Standing Status:   Future    Standing Expiration Date:   10/23/2020    Scheduling Instructions:     Side: Right-sided     Level: C3-4, C4-5, C5-6 Facet joints (C3, C4, C5, C6, & C7 Medial Branch Nerves)     Sedation: Patient's choice.     Timeframe: As soon as schedule allows    Order Specific Question:   Where will this procedure be performed?    Answer:   [facility]   Follow-up plan:   Return for Procedure (w/ sedation): (R) C-FCT BLK #2.      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  Unable to enter space despite multiple attempts on 03/19/2020.   Considering:   Diagnostic Trigger point injections Diagnostic midline caudal ESI  Possible Racz procedure (epidural Neurolysis) Diagnostic  thoracic facet block Possible thoracic facet RFA Diagnostic right-sided CESI #1  Diagnosticbilateral cervical facet block Possiblebilateral cervical facet RFA Diagnostic right IA shoulder injection Diagnostic right suprascapular NB Possibleright suprascapular RFA Diagnostic left genicular NB Possible bilateral genicular RFA   Palliative PRN treatment(s):   Palliative right T6-7 TESI #2  Palliative bilateral lumbar facet block #4  Palliative right SI block #3  Palliative right IA hip joint injections  Palliative right lumbar facet RFA #2 (last done 07/25/2019)  Palliative right SI joint RFA #2 (last done 05/03/2018)  Palliative left  lumbar facet RFA #2 (last done 09/10/2019)  Palliative right genicular NB #2     Recent Visits Date Type Provider Dept  09/08/20 Procedure visit Milinda Pointer, MD [facility]  08/26/20 Office Visit Milinda Pointer, MD [facility]  Showing recent visits within past 90 days and meeting all other requirements Today's Visits Date Type Provider Dept  09/23/20 Office Visit Milinda Pointer, MD [facility]  Showing today's visits and meeting all other requirements Future Appointments Date Type Provider Dept  10/06/20 Appointment Milinda Pointer, MD [facility]  Showing future appointments within next 90 days and meeting all other requirements  I discussed the assessment and treatment plan with the patient. The patient was provided an opportunity to ask questions and all were answered. The patient agreed with the plan and demonstrated an understanding of the instructions.  Patient advised to call back or seek an in-person evaluation if the symptoms or condition worsens.  Duration of encounter: 70 minutes.  Note by: Gaspar Cola, MD Date: 09/23/2020; Time: 11:07 AM

## 2020-09-23 ENCOUNTER — Encounter: Payer: Self-pay | Admitting: Pain Medicine

## 2020-09-23 ENCOUNTER — Telehealth: Payer: PPO | Admitting: Pain Medicine

## 2020-09-23 ENCOUNTER — Ambulatory Visit: Payer: PPO | Attending: Pain Medicine | Admitting: Pain Medicine

## 2020-09-23 ENCOUNTER — Telehealth: Payer: Self-pay | Admitting: *Deleted

## 2020-09-23 VITALS — BP 128/61 | HR 89 | Temp 97.4°F | Ht 63.0 in | Wt 191.0 lb

## 2020-09-23 DIAGNOSIS — F1120 Opioid dependence, uncomplicated: Secondary | ICD-10-CM

## 2020-09-23 DIAGNOSIS — T50901S Poisoning by unspecified drugs, medicaments and biological substances, accidental (unintentional), sequela: Secondary | ICD-10-CM | POA: Diagnosis not present

## 2020-09-23 DIAGNOSIS — G8929 Other chronic pain: Secondary | ICD-10-CM | POA: Insufficient documentation

## 2020-09-23 DIAGNOSIS — M549 Dorsalgia, unspecified: Secondary | ICD-10-CM | POA: Insufficient documentation

## 2020-09-23 DIAGNOSIS — M5441 Lumbago with sciatica, right side: Secondary | ICD-10-CM | POA: Diagnosis not present

## 2020-09-23 DIAGNOSIS — M5442 Lumbago with sciatica, left side: Secondary | ICD-10-CM | POA: Diagnosis not present

## 2020-09-23 DIAGNOSIS — M542 Cervicalgia: Secondary | ICD-10-CM | POA: Diagnosis not present

## 2020-09-23 DIAGNOSIS — G894 Chronic pain syndrome: Secondary | ICD-10-CM | POA: Diagnosis not present

## 2020-09-23 DIAGNOSIS — R937 Abnormal findings on diagnostic imaging of other parts of musculoskeletal system: Secondary | ICD-10-CM | POA: Diagnosis not present

## 2020-09-23 DIAGNOSIS — Z79899 Other long term (current) drug therapy: Secondary | ICD-10-CM | POA: Insufficient documentation

## 2020-09-23 DIAGNOSIS — M47812 Spondylosis without myelopathy or radiculopathy, cervical region: Secondary | ICD-10-CM | POA: Diagnosis not present

## 2020-09-23 NOTE — Patient Instructions (Addendum)
____________________________________________________________________________________________  Preparing for Procedure with Sedation  Procedure appointments are limited to planned procedures: . No Prescription Refills. . No disability issues will be discussed. . No medication changes will be discussed.  Instructions: . Oral Intake: Do not eat or drink anything for at least 8 hours prior to your procedure. (Exception: Blood Pressure Medication. See below.) . Transportation: Unless otherwise stated by your physician, you may drive yourself after the procedure. . Blood Pressure Medicine: Do not forget to take your blood pressure medicine with a sip of water the morning of the procedure. If your Diastolic (lower reading)is above 100 mmHg, elective cases will be cancelled/rescheduled. . Blood thinners: These will need to be stopped for procedures. Notify our staff if you are taking any blood thinners. Depending on which one you take, there will be specific instructions on how and when to stop it. . Diabetics on insulin: Notify the staff so that you can be scheduled 1st case in the morning. If your diabetes requires high dose insulin, take only  of your normal insulin dose the morning of the procedure and notify the staff that you have done so. . Preventing infections: Shower with an antibacterial soap the morning of your procedure. . Build-up your immune system: Take 1000 mg of Vitamin C with every meal (3 times a day) the day prior to your procedure. . Antibiotics: Inform the staff if you have a condition or reason that requires you to take antibiotics before dental procedures. . Pregnancy: If you are pregnant, call and cancel the procedure. . Sickness: If you have a cold, fever, or any active infections, call and cancel the procedure. . Arrival: You must be in the facility at least 30 minutes prior to your scheduled procedure. . Children: Do not bring children with you. . Dress appropriately:  Bring dark clothing that you would not mind if they get stained. . Valuables: Do not bring any jewelry or valuables.  Reasons to call and reschedule or cancel your procedure: (Following these recommendations will minimize the risk of a serious complication.) . Surgeries: Avoid having procedures within 2 weeks of any surgery. (Avoid for 2 weeks before or after any surgery). . Flu Shots: Avoid having procedures within 2 weeks of a flu shots or . (Avoid for 2 weeks before or after immunizations). . Barium: Avoid having a procedure within 7-10 days after having had a radiological study involving the use of radiological contrast. (Myelograms, Barium swallow or enema study). . Heart attacks: Avoid any elective procedures or surgeries for the initial 6 months after a "Myocardial Infarction" (Heart Attack). . Blood thinners: It is imperative that you stop these medications before procedures. Let us know if you if you take any blood thinner.  . Infection: Avoid procedures during or within two weeks of an infection (including chest colds or gastrointestinal problems). Symptoms associated with infections include: Localized redness, fever, chills, night sweats or profuse sweating, burning sensation when voiding, cough, congestion, stuffiness, runny nose, sore throat, diarrhea, nausea, vomiting, cold or Flu symptoms, recent or current infections. It is specially important if the infection is over the area that we intend to treat. . Heart and lung problems: Symptoms that may suggest an active cardiopulmonary problem include: cough, chest pain, breathing difficulties or shortness of breath, dizziness, ankle swelling, uncontrolled high or unusually low blood pressure, and/or palpitations. If you are experiencing any of these symptoms, cancel your procedure and contact your primary care physician for an evaluation.  Remember:  Regular Business hours are:    Monday to Thursday 8:00 AM to 4:00 PM  Provider's  Schedule: Milinda Pointer, MD:  Procedure days: Tuesday and Thursday 7:30 AM to 4:00 PM  Gillis Santa, MD:  Procedure days: Monday and Wednesday 7:30 AM to 4:00 PM ____________________________________________________________________________________________   Facet Blocks Patient Information  Description: The facets are joints in the spine between the vertebrae.  Like any joints in the body, facets can become irritated and painful.  Arthritis can also effect the facets.  By injecting steroids and local anesthetic in and around these joints, we can temporarily block the nerve supply to them.  Steroids act directly on irritated nerves and tissues to reduce selling and inflammation which often leads to decreased pain.  Facet blocks may be done anywhere along the spine from the neck to the low back depending upon the location of your pain.   After numbing the skin with local anesthetic (like Novocaine), a small needle is passed onto the facet joints under x-ray guidance.  You may experience a sensation of pressure while this is being done.  The entire block usually lasts about 15-25 minutes.   Conditions which may be treated by facet blocks:   Low back/buttock pain  Neck/shoulder pain  Certain types of headaches  Preparation for the injection:  1. Do not eat any solid food or dairy products within 8 hours of your appointment. 2. You may drink clear liquid up to 3 hours before appointment.  Clear liquids include water, black coffee, juice or soda.  No milk or cream please. 3. You may take your regular medication, including pain medications, with a sip of water before your appointment.  Diabetics should hold regular insulin (if taken separately) and take 1/2 normal NPH dose the morning of the procedure.  Carry some sugar containing items with you to your appointment. 4. A driver must accompany you and be prepared to drive you home after your procedure. 5. Bring all your current medications  with you. 6. An IV may be inserted and sedation may be given at the discretion of the physician. 7. A blood pressure cuff, EKG and other monitors will often be applied during the procedure.  Some patients may need to have extra oxygen administered for a short period. 8. You will be asked to provide medical information, including your allergies and medications, prior to the procedure.  We must know immediately if you are taking blood thinners (like Coumadin/Warfarin) or if you are allergic to IV iodine contrast (dye).  We must know if you could possible be pregnant.  Possible side-effects:   Bleeding from needle site  Infection (rare, may require surgery)  Nerve injury (rare)  Numbness & tingling (temporary)  Difficulty urinating (rare, temporary)  Spinal headache (a headache worse with upright posture)  Light-headedness (temporary)  Pain at injection site (serveral days)  Decreased blood pressure (rare, temporary)  Weakness in arm/leg (temporary)  Pressure sensation in back/neck (temporary)   Call if you experience:   Fever/chills associated with headache or increased back/neck pain  Headache worsened by an upright position  New onset, weakness or numbness of an extremity below the injection site  Hives or difficulty breathing (go to the emergency room)  Inflammation or drainage at the injection site(s)  Severe back/neck pain greater than usual  New symptoms which are concerning to you  Please note:  Although the local anesthetic injected can often make your back or neck feel good for several hours after the injection, the pain will likely return. It takes  days for steroids to work.  You may not notice any pain relief for at least one week.  If effective, we will often do a series of 2-3 injections spaced 3-6 weeks apart to maximally decrease your pain.  After the initial series, you may be a candidate for a more permanent nerve block of the facets.  If you  have any questions, please call [phone] [facility] 

## 2020-09-23 NOTE — Telephone Encounter (Signed)
Called to [facility] pharmacy in [place] and cancelled all opioid Rx on file.  There were 3 and he has cancelled them all per FN orders.

## 2020-09-23 NOTE — Progress Notes (Signed)
Nursing Pain Medication Assessment:  Safety precautions to be maintained throughout the outpatient stay will include: orient to surroundings, keep bed in low position, maintain call bell within reach at all times, provide assistance with transfer out of bed and ambulation.  Medication Inspection Compliance: Ms. Schnackenberg did not comply with our request to bring her pills to be counted. She was reminded that bringing the medication bottles, even when empty, is a requirement.  Medication: None brought in. Pill/Patch Count: None available to be counted. Bottle Appearance: No container available. Did not bring bottle(s) to appointment. Filled Date: N/A Last Medication intake:  not taking anymoreSafety precautions to be maintained throughout the outpatient stay will include: orient to surroundings, keep bed in low position, maintain call bell within reach at all times, provide assistance with transfer out of bed and ambulation.

## 2020-10-06 ENCOUNTER — Encounter: Payer: Self-pay | Admitting: Pain Medicine

## 2020-10-06 ENCOUNTER — Other Ambulatory Visit: Payer: Self-pay

## 2020-10-06 ENCOUNTER — Ambulatory Visit (HOSPITAL_BASED_OUTPATIENT_CLINIC_OR_DEPARTMENT_OTHER): Payer: PPO | Admitting: Pain Medicine

## 2020-10-06 ENCOUNTER — Ambulatory Visit
Admission: RE | Admit: 2020-10-06 | Discharge: 2020-10-06 | Disposition: A | Discharge status: Home / Self Care | Payer: PPO | Source: Ambulatory Visit | Attending: Pain Medicine | Admitting: Pain Medicine

## 2020-10-06 VITALS — BP 94/67 | HR 79 | Temp 97.7°F | Resp 15 | Ht 63.0 in | Wt 191.0 lb

## 2020-10-06 DIAGNOSIS — M25512 Pain in left shoulder: Secondary | ICD-10-CM | POA: Insufficient documentation

## 2020-10-06 DIAGNOSIS — M5030 Other cervical disc degeneration, unspecified cervical region: Secondary | ICD-10-CM | POA: Diagnosis not present

## 2020-10-06 DIAGNOSIS — G8929 Other chronic pain: Secondary | ICD-10-CM | POA: Diagnosis not present

## 2020-10-06 DIAGNOSIS — M542 Cervicalgia: Secondary | ICD-10-CM

## 2020-10-06 DIAGNOSIS — M25511 Pain in right shoulder: Secondary | ICD-10-CM | POA: Diagnosis not present

## 2020-10-06 DIAGNOSIS — E7849 Other hyperlipidemia: Secondary | ICD-10-CM | POA: Diagnosis not present

## 2020-10-06 DIAGNOSIS — M47812 Spondylosis without myelopathy or radiculopathy, cervical region: Secondary | ICD-10-CM | POA: Insufficient documentation

## 2020-10-06 DIAGNOSIS — K219 Gastro-esophageal reflux disease without esophagitis: Secondary | ICD-10-CM | POA: Diagnosis not present

## 2020-10-06 MED ORDER — MIDAZOLAM HCL 5 MG/5ML IJ SOLN
1.0000 mg | INTRAMUSCULAR | Status: DC | PRN
  Administered 2020-10-06: 1 mg via INTRAVENOUS

## 2020-10-06 MED ORDER — FENTANYL CITRATE (PF) 100 MCG/2ML IJ SOLN
INTRAMUSCULAR | Status: AC
  Filled 2020-10-06: qty 2

## 2020-10-06 MED ORDER — FENTANYL CITRATE (PF) 100 MCG/2ML IJ SOLN
25.0000 ug | INTRAMUSCULAR | Status: DC | PRN
  Administered 2020-10-06: 50 ug via INTRAVENOUS

## 2020-10-06 MED ORDER — ROPIVACAINE HCL 2 MG/ML IJ SOLN
INTRAMUSCULAR | Status: AC
  Filled 2020-10-06: qty 10

## 2020-10-06 MED ORDER — LIDOCAINE HCL 2 % IJ SOLN
20.0000 mL | Freq: Once | INTRAMUSCULAR | Status: AC
  Administered 2020-10-06: 400 mg

## 2020-10-06 MED ORDER — DEXAMETHASONE SODIUM PHOSPHATE 10 MG/ML IJ SOLN
10.0000 mg | Freq: Once | INTRAMUSCULAR | Status: AC
  Administered 2020-10-06: 10 mg

## 2020-10-06 MED ORDER — LACTATED RINGERS IV SOLN
1000.0000 mL | Freq: Once | INTRAVENOUS | Status: AC
  Administered 2020-10-06: 1000 mL via INTRAVENOUS

## 2020-10-06 MED ORDER — ROPIVACAINE HCL 2 MG/ML IJ SOLN
9.0000 mL | Freq: Once | INTRAMUSCULAR | Status: AC
  Administered 2020-10-06: 9 mL via PERINEURAL

## 2020-10-06 MED ORDER — DEXAMETHASONE SODIUM PHOSPHATE 10 MG/ML IJ SOLN
INTRAMUSCULAR | Status: AC
  Filled 2020-10-06: qty 1

## 2020-10-06 MED ORDER — MIDAZOLAM HCL 5 MG/5ML IJ SOLN
INTRAMUSCULAR | Status: AC
  Filled 2020-10-06: qty 5

## 2020-10-06 NOTE — Patient Instructions (Signed)

## 2020-10-06 NOTE — Progress Notes (Signed)
Safety precautions to be maintained throughout the outpatient stay will include: orient to surroundings, keep bed in low position, maintain call bell within reach at all times, provide assistance with transfer out of bed and ambulation.  

## 2020-10-06 NOTE — Progress Notes (Signed)
Unfortunately, while putting away the equipment after the procedure, I stuck my left hand index finger with the 25-gauge needle used to infiltrate the skin.  By protocol, I did inform our clinical manager and the patient was sent to have the standard testing.

## 2020-10-06 NOTE — Progress Notes (Signed)
PROVIDER NOTE: Information contained herein reflects review and annotations entered in association with encounter. Interpretation of such information and data should be left to medically-trained personnel. Information provided to patient can be located elsewhere in the medical record under "Patient Instructions". Document created using STT-dictation technology, any transcriptional errors that may result from process are unintentional.    Patient: Jennifer Harmon  Service Category: Procedure  Provider: Oswaldo Done, MD  DOB: 12-May-1961  DOS: 10/06/2020  Location: [facility]  MRN: [id number]  Setting: Ambulatory - outpatient  Referring Provider: Ignatius Specking, MD  Type: Established Patient  Specialty: Interventional Pain Management  PCP: Ignatius Specking, MD   Primary Reason for Visit: Interventional Pain Management Treatment. CC: Neck Pain  Procedure:          Anesthesia, Analgesia, Anxiolysis:  Type: Cervical Facet Medial Branch Block(s)  #2  Primary Purpose: Diagnostic Region: Posterolateral cervical spine Level: C3, C4, C5, C6, & C7 Medial Branch Level(s). Injecting these levels blocks the C3-4, C4-5, C5-6, and C6-7 cervical facet joints. Laterality: Right  Type: Moderate (Conscious) Sedation combined with Local Anesthesia Indication(s): Analgesia and Anxiety Route: Intravenous (IV) IV Access: Secured Sedation: Meaningful verbal contact was maintained at all times during the procedure  Local Anesthetic: Lidocaine 1-2%  Position: Prone with head of the table raised to facilitate breathing.   Indications: 1. Cervical facet syndrome (Right)   2. Cervicalgia   3. Spondylosis without myelopathy or radiculopathy, cervical region   4. DDD (degenerative disc disease), cervical   5. Chronic shoulder pain (Bilateral) (R>L)    Pain Score: Pre-procedure: 7 /10 Post-procedure: 0-No pain/10   Pre-op H&P Assessment:  Jennifer Harmon is a 59 y.o. (year old), female patient,  seen today for interventional treatment. She  has a past surgical history that includes Cholecystectomy; Cesarean section; Back surgery (2008); Joint replacement (Right, 04/16/2019); and Breast reduction surgery (Bilateral, 05/25/2020). Jennifer Harmon has a current medication list which includes the following prescription(s): calcium carbonate, citalopram, clonazepam, cyclobenzaprine, diclofenac sodium, esomeprazole, famotidine, latanoprost, magnesium oxide, multi-vitamin, polyvinyl alcohol, pregabalin, and rosuvastatin, and the following Facility-Administered Medications: fentanyl and midazolam. Her primarily concern today is the Neck Pain  Initial Vital Signs:  Pulse/HCG Rate: 79ECG Heart Rate: 80 Temp: 97.9 F (36.6 C) Resp: 16 BP: 134/74 SpO2: 100 %  BMI: Estimated body mass index is 33.83 kg/m as calculated from the following:   Height as of this encounter:  (1.6 m).   Weight as of this encounter: 191 lb (86.6 kg).  Risk Assessment: Allergies: Reviewed. She is allergic to aspirin, bupropion, ibuprofen, and tape.  Allergy Precautions: None required Coagulopathies: Reviewed. None identified.  Blood-thinner therapy: None at this time Active Infection(s): Reviewed. None identified. Jennifer Harmon is afebrile  Site Confirmation: Jennifer Harmon was asked to confirm the procedure and laterality before marking the site Procedure checklist: Completed Consent: Before the procedure and under the influence of no sedative(s), amnesic(s), or anxiolytics, the patient was informed of the treatment options, risks and possible complications. To fulfill our ethical and legal obligations, as recommended by the American Medical Association's Code of Ethics, I have informed the patient of my clinical impression; the nature and purpose of the treatment or procedure; the risks, benefits, and possible complications of the intervention; the alternatives, including doing nothing; the risk(s) and benefit(s) of the  alternative treatment(s) or procedure(s); and the risk(s) and benefit(s) of doing nothing. The patient was provided information about the general risks and possible complications associated with  the procedure. These may include, but are not limited to: failure to achieve desired goals, infection, bleeding, organ or nerve damage, allergic reactions, paralysis, and death. In addition, the patient was informed of those risks and complications associated to Spine-related procedures, such as failure to decrease pain; infection (i.e.: Meningitis, epidural or intraspinal abscess); bleeding (i.e.: epidural hematoma, subarachnoid hemorrhage, or any other type of intraspinal or peri-dural bleeding); organ or nerve damage (i.e.: Any type of peripheral nerve, nerve root, or spinal cord injury) with subsequent damage to sensory, motor, and/or autonomic systems, resulting in permanent pain, numbness, and/or weakness of one or several areas of the body; allergic reactions; (i.e.: anaphylactic reaction); and/or death. Furthermore, the patient was informed of those risks and complications associated with the medications. These include, but are not limited to: allergic reactions (i.e.: anaphylactic or anaphylactoid reaction(s)); adrenal axis suppression; blood sugar elevation that in diabetics may result in ketoacidosis or comma; water retention that in patients with history of congestive heart failure may result in shortness of breath, pulmonary edema, and decompensation with resultant heart failure; weight gain; swelling or edema; medication-induced neural toxicity; particulate matter embolism and blood vessel occlusion with resultant organ, and/or nervous system infarction; and/or aseptic necrosis of one or more joints. Finally, the patient was informed that Medicine is not an exact science; therefore, there is also the possibility of unforeseen or unpredictable risks and/or possible complications that may result in a  catastrophic outcome. The patient indicated having understood very clearly. We have given the patient no guarantees and we have made no promises. Enough time was given to the patient to ask questions, all of which were answered to the patient's satisfaction. Ms. Morganti has indicated that she wanted to continue with the procedure. Attestation: I, the ordering provider, attest that I have discussed with the patient the benefits, risks, side-effects, alternatives, likelihood of achieving goals, and potential problems during recovery for the procedure that I have provided informed consent. Date  Time: 10/06/2020  7:59 AM  Pre-Procedure Preparation:  Monitoring: As per clinic protocol. Respiration, ETCO2, SpO2, BP, heart rate and rhythm monitor placed and checked for adequate function Safety Precautions: Patient was assessed for positional comfort and pressure points before starting the procedure. Time-out: I initiated and conducted the "Time-out" before starting the procedure, as per protocol. The patient was asked to participate by confirming the accuracy of the "Time Out" information. Verification of the correct person, site, and procedure were performed and confirmed by me, the nursing staff, and the patient. "Time-out" conducted as per Joint Commission's Universal Protocol (UP.01.01.01). Time: 2956  Description of Procedure:          Laterality: Right Level: C3, C4, C5, C6, & C7 Medial Branch Level(s). Area Prepped: Posterior Cervico-thoracic Region DuraPrep (Iodine Povacrylex [0.7% available iodine] and Isopropyl Alcohol, 74% w/w) Safety Precautions: Aspiration looking for blood return was conducted prior to all injections. At no point did we inject any substances, as a needle was being advanced. Before injecting, the patient was told to immediately notify me if she was experiencing any new onset of "ringing in the ears, or metallic taste in the mouth". No attempts were made at seeking any  paresthesias. Safe injection practices and needle disposal techniques used. Medications properly checked for expiration dates. SDV (single dose vial) medications used. After the completion of the procedure, all disposable equipment used was discarded in the proper designated medical waste containers. Local Anesthesia: Protocol guidelines were followed. The patient was positioned over the fluoroscopy table. The  area was prepped in the usual manner. The time-out was completed. The target area was identified using fluoroscopy. A 12-in long, straight, sterile hemostat was used with fluoroscopic guidance to locate the targets for each level blocked. Once located, the skin was marked with an approved surgical skin marker. Once all sites were marked, the skin (epidermis, dermis, and hypodermis), as well as deeper tissues (fat, connective tissue and muscle) were infiltrated with a small amount of a short-acting local anesthetic, loaded on a 10cc syringe with a 25G, 1.5-in  Needle. An appropriate amount of time was allowed for local anesthetics to take effect before proceeding to the next step. Local Anesthetic: Lidocaine 2.0% The unused portion of the local anesthetic was discarded in the proper designated containers. Technical explanation of process:  C3 Medial Branch Nerve Block (MBB): The target area for the C3 dorsal medial articular branch is the lateral concave waist of the articular pillar of C3. Under fluoroscopic guidance, a Quincke needle was inserted until contact was made with os over the postero-lateral aspect of the articular pillar of C3 (target area). After negative aspiration for blood, 0.5 mL of the nerve block solution was injected without difficulty or complication. The needle was removed intact. C4 Medial Branch Nerve Block (MBB): The target area for the C4 dorsal medial articular branch is the lateral concave waist of the articular pillar of C4. Under fluoroscopic guidance, a Quincke needle was  inserted until contact was made with os over the postero-lateral aspect of the articular pillar of C4 (target area). After negative aspiration for blood, 0.5 mL of the nerve block solution was injected without difficulty or complication. The needle was removed intact. C5 Medial Branch Nerve Block (MBB): The target area for the C5 dorsal medial articular branch is the lateral concave waist of the articular pillar of C5. Under fluoroscopic guidance, a Quincke needle was inserted until contact was made with os over the postero-lateral aspect of the articular pillar of C5 (target area). After negative aspiration for blood, 0.5 mL of the nerve block solution was injected without difficulty or complication. The needle was removed intact. C6 Medial Branch Nerve Block (MBB): The target area for the C6 dorsal medial articular branch is the lateral concave waist of the articular pillar of C6. Under fluoroscopic guidance, a Quincke needle was inserted until contact was made with os over the postero-lateral aspect of the articular pillar of C6 (target area). After negative aspiration for blood, 0.5 mL of the nerve block solution was injected without difficulty or complication. The needle was removed intact. C7 Medial Branch Nerve Block (MBB): The target for the C7 dorsal medial articular branch lies on the superior-medial tip of the C7 transverse process. Under fluoroscopic guidance, a Quincke needle was inserted until contact was made with os over the postero-lateral aspect of the articular pillar of C7 (target area). After negative aspiration for blood, 0.5 mL of the nerve block solution was injected without difficulty or complication. The needle was removed intact. Procedural Needles: 22-gauge, 3.5-inch, Quincke needles used for all levels. Nerve block solution: 0.2% PF-Ropivacaine + Triamcinolone (40 mg/mL) diluted to a final concentration of 4 mg of Triamcinolone/mL of Ropivacaine The unused portion of the solution  was discarded in the proper designated containers.  Once the entire procedure was completed, the treated area was cleaned, making sure to leave some of the prepping solution back to take advantage of its long term bactericidal properties.  Anatomy Reference Guide:       Vitals:  10/06/20 0856 10/06/20 0859 10/06/20 0909 10/06/20 0919  BP: (!) 159/83 (!) 144/84 (!) 145/80 94/67  Pulse:      Resp: 15 16 14 15   Temp:  (!) 97.3 F (36.3 C)  97.7 F (36.5 C)  SpO2: 97% 100% 100% 100%  Weight:      Height:        Start Time: 0842 hrs. End Time: 0854 hrs.  Imaging Guidance (Spinal):          Type of Imaging Technique: Fluoroscopy Guidance (Spinal) Indication(s): Assistance in needle guidance and placement for procedures requiring needle placement in or near specific anatomical locations not easily accessible without such assistance. Exposure Time: Please see nurses notes. Contrast: None used. Fluoroscopic Guidance: I was personally present during the use of fluoroscopy. "Tunnel Vision Technique" used to obtain the best possible view of the target area. Parallax error corrected before commencing the procedure. "Direction-depth-direction" technique used to introduce the needle under continuous pulsed fluoroscopy. Once target was reached, antero-posterior, oblique, and lateral fluoroscopic projection used confirm needle placement in all planes. Images permanently stored in EMR. Interpretation: No contrast injected. I personally interpreted the imaging intraoperatively. Adequate needle placement confirmed in multiple planes. Permanent images saved into the patient's record.  Antibiotic Prophylaxis:   Anti-infectives (From admission, onward)   None     Indication(s): None identified  Post-operative Assessment:  Post-procedure Vital Signs:  Pulse/HCG Rate: 7995 Temp: 97.7 F (36.5 C) Resp: 15 BP: 94/67 SpO2: 100 %  EBL: None  Complications: No immediate post-treatment  complications observed by team, or reported by patient.  Note: The patient tolerated the entire procedure well. A repeat set of vitals were taken after the procedure and the patient was kept under observation following institutional policy, for this type of procedure. Post-procedural neurological assessment was performed, showing return to baseline, prior to discharge. The patient was provided with post-procedure discharge instructions, including a section on how to identify potential problems. Should any problems arise concerning this procedure, the patient was given instructions to immediately contact us, at any time, without hesitation. In any case, we plan to contact the patient by telephone for a follow-up status report regarding this interventional procedure.  Comments:  The patient was noncompliant during the procedure.  We asked her multiple times to get comfortable and then avoid moving but she would continue to move and reposition after the C arm had been set for the target.  Unfortunately this significantly increased both, the patient and the staff's exposure to radiation.  Plan of Care  Orders:  Orders Placed This Encounter  Procedures  . CERVICAL FACET (MEDIAL BRANCH NERVE BLOCK)     Scheduling Instructions:     Side: Right-sided     Level: C3-4, C4-5, C5-6 Facet joints (C3, C4, C5, C6, & C7 Medial Branch Nerves)     Sedation: Patient's choice.     Timeframe: Today    Order Specific Question:   Where will this procedure be performed?    Answer:   [facility]  . [facility] C-ARM 1-60 MIN NO REPORT    Intraoperative interpretation by procedural physician at [facility].    Standing Status:   Standing    Number of Occurrences:   1    Order Specific Question:   Reason for exam:    Answer:   Assistance in needle guidance and placement for procedures requiring needle placement in or near specific anatomical locations not easily accessible without such assistance.   [name]  Informed Consent Details: Physician/Practitioner Attestation; Transcribe to consent form and obtain patient signature    Note: Always confirm laterality of pain with Ms. Merkle, before procedure. Transcribe to consent form and obtain patient signature.    Order Specific Question:   Physician/Practitioner attestation of informed consent for procedure/surgical case    Answer:   I, the physician/practitioner, attest that I have discussed with the patient the benefits, risks, side effects, alternatives, likelihood of achieving goals and potential problems during recovery for the procedure that I have provided informed consent.    Order Specific Question:   Procedure    Answer:   Right Cervical facet block under fluoroscopic guidance.    Order Specific Question:   Physician/Practitioner performing the procedure    Answer:   Dougles Kimmey A. Laban Emperor, MD    Order Specific Question:   Indication/Reason    Answer:   Chronic neck pain secondary to cervical facet syndrome  . Provide equipment / supplies at bedside    "Block Tray" (Disposable  single use) Needle type: SpinalSpinal Amount/quantity: 5 Size: Regular (3.5-inch) Gauge: 22G    Standing Status:   Standing    Number of Occurrences:   1    Order Specific Question:   Specify    Answer:   Block Tray   Chronic Opioid Analgesic:  Opioid analgesics DISCONTINUED secondary to unintentional overdose on 09/17/2020. MME/day: 0 mg/day.   Medications ordered for procedure: Meds ordered this encounter  Medications  . lidocaine (XYLOCAINE) 2 % (with pres) injection 400 mg  . lactated ringers infusion 1,000 mL  . midazolam (VERSED) 5 MG/5ML injection 1-2 mg    Make sure Flumazenil is available in the pyxis when using this medication. If oversedation occurs, administer 0.2 mg IV over 15 sec. If after 45 sec no response, administer 0.2 mg again over 1 min; may repeat at 1 min intervals; not to exceed 4 doses (1 mg)  . fentaNYL (SUBLIMAZE) injection  25-50 mcg    Make sure Narcan is available in the pyxis when using this medication. In the event of respiratory depression (RR< 8/min): Titrate NARCAN (naloxone) in increments of 0.1 to 0.2 mg IV at 2-3 minute intervals, until desired degree of reversal.  . ropivacaine (PF) 2 mg/mL (0.2%) (NAROPIN) injection 9 mL  . dexamethasone (DECADRON) injection 10 mg   Medications administered: We administered lidocaine, lactated ringers, midazolam, fentaNYL, ropivacaine (PF) 2 mg/mL (0.2%), and dexamethasone.  See the medical record for exact dosing, route, and time of administration.  Follow-up plan:   Return in about 2 weeks (around 10/20/2020) for (VIrtual), (PP) Follow-up.       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  Unable to enter space despite multiple attempts on 03/19/2020.   Considering:   Diagnostic Trigger point injections Diagnostic midline caudal ESI  Possible Racz procedure (epidural Neurolysis) Diagnostic thoracic facet block Possible thoracic facet RFA Diagnostic right-sided CESI #1  Diagnosticbilateral cervical facet block Possiblebilateral cervical facet RFA Diagnostic right IA shoulder injection Diagnostic right suprascapular NB Possibleright suprascapular RFA Diagnostic left genicular NB Possible bilateral genicular RFA   Palliative PRN treatment(s):   Palliative right T6-7 TESI #2  Palliative bilateral lumbar facet block #4  Palliative right SI block #3  Palliative right IA hip joint injections  Palliative right lumbar facet RFA #2 (last done 07/25/2019)  Palliative right SI joint RFA #2 (last done 05/03/2018)  Palliative left  lumbar facet RFA #  2 (last done 09/10/2019)  Palliative right genicular NB #2      Recent Visits Date Type Provider Dept  09/23/20 Office Visit Delano Metz, MD [facility]  09/08/20 Procedure visit  Delano Metz, MD [facility]  08/26/20 Office Visit Delano Metz, MD [facility]  Showing recent visits within past 90 days and meeting all other requirements Today's Visits Date Type Provider Dept  10/06/20 Procedure visit Delano Metz, MD [facility]  Showing today's visits and meeting all other requirements Future Appointments Date Type Provider Dept  10/21/20 Appointment Delano Metz, MD [facility]  12/23/20 Appointment Delano Metz, MD [facility]  Showing future appointments within next 90 days and meeting all other requirements  Disposition: Discharge home  Discharge (Date  Time): 10/06/2020; 0921 hrs.   Primary Care Physician: Ignatius Specking, MD Location: [facility] Note by: Oswaldo Done, MD Date: 10/06/2020; Time: 10:50 AM  Disclaimer:  Medicine is not an Visual merchandiser. The only guarantee in medicine is that nothing is guaranteed. It is important to note that the decision to proceed with this intervention was based on the information collected from the patient. The Data and conclusions were drawn from the patient's questionnaire, the interview, and the physical examination. Because the information was provided in large part by the patient, it cannot be guaranteed that it has not been purposely or unconsciously manipulated. Every effort has been made to obtain as much relevant data as possible for this evaluation. It is important to note that the conclusions that lead to this procedure are derived in large part from the available data. Always take into account that the treatment will also be dependent on availability of resources and existing treatment guidelines, considered by other Pain Management Practitioners as being common knowledge and practice, at the time of the intervention. For Medico-Legal purposes, it is also important to point out that variation in  procedural techniques and pharmacological choices are the acceptable norm. The indications, contraindications, technique, and results of the above procedure should only be interpreted and judged by a Board-Certified Interventional Pain Specialist with extensive familiarity and expertise in the same exact procedure and technique.

## 2020-10-07 ENCOUNTER — Telehealth: Payer: Self-pay | Admitting: *Deleted

## 2020-10-07 NOTE — Telephone Encounter (Signed)
Attempted to call for post procedure follow-up. Message left. 

## 2020-10-16 DIAGNOSIS — E7800 Pure hypercholesterolemia, unspecified: Secondary | ICD-10-CM | POA: Diagnosis not present

## 2020-10-16 DIAGNOSIS — F419 Anxiety disorder, unspecified: Secondary | ICD-10-CM | POA: Diagnosis not present

## 2020-10-16 DIAGNOSIS — Z299 Encounter for prophylactic measures, unspecified: Secondary | ICD-10-CM | POA: Diagnosis not present

## 2020-10-16 DIAGNOSIS — F322 Major depressive disorder, single episode, severe without psychotic features: Secondary | ICD-10-CM | POA: Diagnosis not present

## 2020-10-18 NOTE — Progress Notes (Signed)
Patient: Jennifer Harmon  Service Category: E/M  Provider: Gaspar Cola, MD  DOB: 1961-02-10  DOS: 10/21/2020  Location: Office  MRN: [id number]  Setting: Ambulatory outpatient  Referring Provider: Glenda Chroman, MD  Type: Established Patient  Specialty: Interventional Pain Management  PCP: Glenda Chroman, MD  Location: Remote location  Delivery: TeleHealth     Virtual Encounter - Pain Management PROVIDER NOTE: Information contained herein reflects review and annotations entered in association with encounter. Interpretation of such information and data should be left to medically-trained personnel. Information provided to patient can be located elsewhere in the medical record under "Patient Instructions". Document created using STT-dictation technology, any transcriptional errors that may result from process are unintentional.    Contact & Pharmacy Preferred: [place]: [phone] (home) Mobile: [phone] (mobile) E-mail: No e-mail address on record  [place] [place] [address] [place] [address] Phone: [phone] Fax: [phone]   Pre-screening  Jennifer Harmon offered "in-person" vs "virtual" encounter. Jennifer Harmon indicated preferring virtual for this encounter.   Reason COVID-19*  Social distancing based on CDC and AMA recommendations.   I contacted Jennifer Harmon on 10/21/2020 via telephone.      I clearly identified myself as Gaspar Cola, MD. I verified that I was speaking with the correct person using two identifiers (Name: Jennifer Harmon, and date of birth: 1961/08/29).  Consent I sought verbal advanced consent from Jennifer Harmon for virtual visit interactions. I informed Jennifer Harmon of possible security and privacy concerns, risks, and limitations associated with providing "not-in-person" medical evaluation and management services. I also informed Jennifer Harmon of the availability of "in-person" appointments.  Finally, I informed her that there would be a charge for the virtual visit and that Jennifer Harmon could be  personally, fully or partially, financially responsible for it. Ms. Harnden expressed understanding and agreed to proceed.   Historic Elements   Jennifer Harmon is a 59 y.o. year old, female patient evaluated today after our last contact on 10/06/2020. Jennifer Harmon  has a past medical history of Acute postoperative pain (05/03/2018), Anxiety, Chronic chest pain, Depression, DJD (degenerative joint disease), Fibromyalgia, GERD (gastroesophageal reflux disease), and Hypercholesterolemia. Jennifer Harmon also  has a past surgical history that includes Cholecystectomy; Cesarean section; Back surgery (2008); Joint replacement (Right, 04/16/2019); and Breast reduction surgery (Bilateral, 05/25/2020). Jennifer Harmon has a current medication list which includes the following prescription(s): calcium carbonate, citalopram, clonazepam, cyclobenzaprine, diclofenac sodium, esomeprazole, famotidine, latanoprost, magnesium oxide, multi-vitamin, polyvinyl alcohol, pregabalin, and rosuvastatin. Jennifer Harmon  reports that Jennifer Harmon quit smoking about 4 years ago. Her smoking use included pipe and cigarettes. Jennifer Harmon has a 1.50 pack-year smoking history. Jennifer Harmon has never used smokeless tobacco. Jennifer Harmon reports that Jennifer Harmon does not drink alcohol and does not use drugs. Jennifer Harmon is allergic to aspirin, bupropion, ibuprofen, and tape.   HPI  Today, Jennifer Harmon is being contacted for a post-procedure assessment.  The patient indicates having attained 100% relief of the pain for the duration of the local anesthetic which later went down to a 75% improvement, but it is still ongoing.  Jennifer Harmon is very happy with those results.  Today I took the time to interpret the results of this diagnostic injection for her and I have also talked to her about the possibility of doing radiofrequency ablation should the pain return again.  Jennifer Harmon was interested in that and I reminded her that should Jennifer Harmon  decide to  go forward, then Jennifer Harmon needs to let us know with some advance so that we can request the preapproval which will take some time.  Today I will include in this note the statement of medical necessity as I believe that this will be necessary for her when this pain starts coming back.  Post-Procedure Evaluation  Procedure (10/06/2020): Diagnostic right cervical facet block #2 under fluoroscopic guidance and IV sedation Pre-procedure pain level: 7/10 Post-procedure: 0/10 (100% relief)  Sedation: Sedation provided.  Effectiveness during initial hour after procedure(Ultra-Short Term Relief): 100 %.  Local anesthetic used: Long-acting (4-6 hours) Effectiveness: Defined as any analgesic benefit obtained secondary to the administration of local anesthetics. This carries significant diagnostic value as to the etiological location, or anatomical origin, of the pain. Duration of benefit is expected to coincide with the duration of the local anesthetic used.  Effectiveness during initial 4-6 hours after procedure(Short-Term Relief): 100 %.  Long-term benefit: Defined as any relief past the pharmacologic duration of the local anesthetics.  Effectiveness past the initial 6 hours after procedure(Long-Term Relief): 75 % (current).  Current benefits: Defined as benefit that persist at this time.   Analgesia:  >75% relief Function: Jennifer Harmon reports improvement in function ROM: Jennifer Harmon reports improvement in ROM    Statement of Medical Necessity:  Jennifer Harmon to experienced debilitating chronic nerve-associated pain from the Cervical Facet Syndrome (Spondylosis without myelopathy or radiculopathy, cervical region [M47.812]).  Duration: This pain has persisted for longer than three months.  Non-surgical care: The patient has either failed to respond, or was unable to tolerate, or simply did not get enough benefit from other more conservative therapies including, but not limited to: 1.  Over-the-counter oral analgesic medications (i.e.: ibuprofen, naproxen, etc.) 2. Anti-inflammatory medications 3. Muscle relaxants 4. Membrane stabilizers 5. Opioids 6. Physical therapy (PT), chiropractic manipulation, and/or home exercise program (HEP). 7. Modalities (Heat, ice, etc.)  Invasive therapies: Cervical facet nerve blocks have proven to be effective and provided the patient with temporary relief of this pain.  Surgical care: Not indicated.  In addition, the patient would prefer not to have any further surgeries.  Physical exam: Has been consistent with Cervical Facet Syndrome.  Diagnostic imaging: Cervical Facet Arthrosis.                Diagnostic interventional therapies: Jennifer Harmon has attained greater than 50% reduction in pain from at least two (2) diagnostic medial branch blocks conducted in separate occasions.   For the above listed reason, I believe, as the examining and treating physician, that it is medically necessary to proceed with Non-Pulsed Radiofrequency Ablation for the purpose of attempting to prolong the duration of the benefits seen with the diagnostic injections.  Pharmacotherapy Assessment  Analgesic: Opioid analgesics DISCONTINUED secondary to unintentional overdose on 09/17/2020. MME/day: 0 mg/day.   Monitoring: [place] PMP: PDMP reviewed during this encounter.       Pharmacotherapy: No side-effects or adverse reactions reported. Compliance: No problems identified. Effectiveness: Clinically acceptable. Plan: Refer to "POC".  UDS:  Summary  Date Value Ref Range Status  03/05/2020 Note  Final    Comment:    ==================================================================== [employer] Select 13 (MW) ==================================================================== Test                             Result       Flag       Units Drug Present   7-aminoclonazepam  817                     ng/mg creat    7-aminoclonazepam is an expected  metabolite of clonazepam. Source of    clonazepam is a scheduled prescription medication.   Oxycodone                      1431                    ng/mg creat   Oxymorphone                    806                     ng/mg creat   Noroxycodone                   2398                    ng/mg creat   Noroxymorphone                 150                     ng/mg creat    Sources of oxycodone are scheduled prescription medications.    Oxymorphone, noroxycodone, and noroxymorphone are expected    metabolites of oxycodone. Oxymorphone is also available as a    scheduled prescription medication. ==================================================================== Test                      Result    Flag   Units      Ref Range   Creatinine              52               mg/dL      >=20 ==================================================================== Declared Medications:  Medication list was not provided. ==================================================================== For clinical consultation, please call [phone]. ====================================================================     Laboratory Chemistry Profile   Renal Lab Results  Component Value Date   BUN 12 09/19/2020   CREATININE 0.64 09/19/2020   GFRAA >60 11/05/2019   GFRNONAA >60 09/19/2020     Hepatic Lab Results  Component Value Date   AST 24 09/19/2020   ALT 33 09/19/2020   ALBUMIN 4.3 09/19/2020   ALKPHOS 123 09/19/2020     Electrolytes Lab Results  Component Value Date   NA 142 09/19/2020   K 3.6 09/19/2020   CL 104 09/19/2020   CALCIUM 9.8 09/19/2020   MG 2.1 09/18/2020   PHOS 3.3 09/18/2020     Bone Lab Results  Component Value Date   VD25OH 21.15 (L) 11/05/2019     Inflammation (CRP: Acute Phase) (ESR: Chronic Phase) Lab Results  Component Value Date   CRP 0.6 11/05/2019   ESRSEDRATE 21 11/05/2019       Note: Above Lab results reviewed.  Imaging  DG PAIN CLINIC C-ARM 1-60 MIN  NO REPORT Fluoro was used, but no Radiologist interpretation will be provided.  Please refer to "NOTES" tab for provider progress note.  Assessment  The primary encounter diagnosis was Chronic pain syndrome. Diagnoses of Cervicalgia, Cervical facet syndrome (Right), Chronic shoulder pain (Bilateral) (R>L), Chronic upper back pain (1ry area of Pain) (Bilateral) (R>L), and Chronic low back pain (2ry area of Pain) (Bilateral) (R>L) were also pertinent to this visit.  Plan of  Care  Problem-specific:  No problem-specific Assessment & Plan notes found for this encounter.  Jennifer Harmon has a current medication list which includes the following long-term medication(s): calcium carbonate, clonazepam, cyclobenzaprine, esomeprazole, magnesium oxide, pregabalin, and rosuvastatin.  Pharmacotherapy (Medications Ordered): No orders of the defined types were placed in this encounter.  Orders:  Orders Placed This Encounter  Procedures  . Radiofrequency,Cervical    Please look at 10/21/2020 note for statement on medical necessity.    Standing Status:   Standing    Number of Occurrences:   1    Standing Expiration Date:   04/21/2021    Scheduling Instructions:     Side(s): Right-sided     Level(s): C3, C4, C5, C6, & C7 Medial Branch Nerve(s)     Sedation: With Sedation.     Scheduling Timeframe: PRN    Order Specific Question:   Where will this procedure be performed?    Answer:   [facility]   Follow-up plan:   No follow-ups on file.      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  Unable to enter space despite multiple attempts on 03/19/2020.   Considering:   Diagnostic Trigger point injections Diagnostic midline caudal ESI  Possible Racz procedure (epidural Neurolysis) Diagnostic thoracic facet block Possible thoracic facet RFA Diagnostic right-sided CESI #1   Diagnosticbilateral cervical facet block Possiblebilateral cervical facet RFA Diagnostic right IA shoulder injection Diagnostic right suprascapular NB Possibleright suprascapular RFA Diagnostic left genicular NB Possible bilateral genicular RFA   Palliative PRN treatment(s):   Palliative right T6-7 TESI #2  Palliative bilateral lumbar facet block #4  Palliative right SI block #3  Palliative right IA hip joint injections  Palliative right lumbar facet RFA #2 (last done 07/25/2019)  Palliative right SI joint RFA #2 (last done 05/03/2018)  Palliative left  lumbar facet RFA #2 (last done 09/10/2019)  Palliative right genicular NB #2     Recent Visits Date Type Provider Dept  10/06/20 Procedure visit Milinda Pointer, MD [facility]  09/23/20 Office Visit Milinda Pointer, MD [facility]  09/08/20 Procedure visit Milinda Pointer, MD [facility]  08/26/20 Office Visit Milinda Pointer, MD [facility]  Showing recent visits within past 90 days and meeting all other requirements Today's Visits Date Type Provider Dept  10/21/20 Telemedicine Milinda Pointer, MD [facility]  Showing today's visits and meeting all other requirements Future Appointments Date Type Provider Dept  12/23/20 Appointment Milinda Pointer, MD [facility]  Showing future appointments within next 90 days and meeting all other requirements  I discussed the assessment and treatment plan with the patient. The patient was provided an opportunity to ask questions and all were answered. The patient agreed with the plan and demonstrated an understanding of the instructions.  Patient advised to call back or seek an in-person evaluation if the symptoms or condition worsens.  Duration of encounter: 15 minutes.  Note by: Gaspar Cola, MD Date: 10/21/2020; Time: 2:56 PM

## 2020-10-21 ENCOUNTER — Other Ambulatory Visit: Payer: Self-pay

## 2020-10-21 ENCOUNTER — Encounter: Payer: Self-pay | Admitting: Pain Medicine

## 2020-10-21 ENCOUNTER — Ambulatory Visit: Payer: PPO | Attending: Pain Medicine | Admitting: Pain Medicine

## 2020-10-21 DIAGNOSIS — M47812 Spondylosis without myelopathy or radiculopathy, cervical region: Secondary | ICD-10-CM

## 2020-10-21 DIAGNOSIS — M549 Dorsalgia, unspecified: Secondary | ICD-10-CM

## 2020-10-21 DIAGNOSIS — G894 Chronic pain syndrome: Secondary | ICD-10-CM | POA: Diagnosis not present

## 2020-10-21 DIAGNOSIS — G8929 Other chronic pain: Secondary | ICD-10-CM | POA: Diagnosis not present

## 2020-10-21 DIAGNOSIS — M25511 Pain in right shoulder: Secondary | ICD-10-CM

## 2020-10-21 DIAGNOSIS — M542 Cervicalgia: Secondary | ICD-10-CM | POA: Diagnosis not present

## 2020-10-21 DIAGNOSIS — M5442 Lumbago with sciatica, left side: Secondary | ICD-10-CM | POA: Diagnosis not present

## 2020-10-21 DIAGNOSIS — M5441 Lumbago with sciatica, right side: Secondary | ICD-10-CM

## 2020-10-21 DIAGNOSIS — M25512 Pain in left shoulder: Secondary | ICD-10-CM | POA: Diagnosis not present

## 2020-11-06 DIAGNOSIS — K219 Gastro-esophageal reflux disease without esophagitis: Secondary | ICD-10-CM | POA: Diagnosis not present

## 2020-11-06 DIAGNOSIS — I10 Essential (primary) hypertension: Secondary | ICD-10-CM | POA: Diagnosis not present

## 2020-11-06 DIAGNOSIS — E7849 Other hyperlipidemia: Secondary | ICD-10-CM | POA: Diagnosis not present

## 2020-11-09 DIAGNOSIS — Z96651 Presence of right artificial knee joint: Secondary | ICD-10-CM | POA: Diagnosis not present

## 2020-11-09 DIAGNOSIS — T8484XD Pain due to internal orthopedic prosthetic devices, implants and grafts, subsequent encounter: Secondary | ICD-10-CM | POA: Diagnosis not present

## 2020-11-09 DIAGNOSIS — M7050 Other bursitis of knee, unspecified knee: Secondary | ICD-10-CM | POA: Diagnosis not present

## 2020-11-09 DIAGNOSIS — Z96652 Presence of left artificial knee joint: Secondary | ICD-10-CM | POA: Diagnosis not present

## 2020-11-09 DIAGNOSIS — M7631 Iliotibial band syndrome, right leg: Secondary | ICD-10-CM | POA: Diagnosis not present

## 2020-11-09 DIAGNOSIS — T84053A Periprosthetic osteolysis of internal prosthetic left knee joint, initial encounter: Secondary | ICD-10-CM | POA: Diagnosis not present

## 2020-11-09 DIAGNOSIS — R7989 Other specified abnormal findings of blood chemistry: Secondary | ICD-10-CM | POA: Diagnosis not present

## 2020-11-09 DIAGNOSIS — Z96653 Presence of artificial knee joint, bilateral: Secondary | ICD-10-CM | POA: Diagnosis not present

## 2020-11-11 DIAGNOSIS — Z299 Encounter for prophylactic measures, unspecified: Secondary | ICD-10-CM | POA: Diagnosis not present

## 2020-11-11 DIAGNOSIS — K219 Gastro-esophageal reflux disease without esophagitis: Secondary | ICD-10-CM | POA: Diagnosis not present

## 2020-11-11 DIAGNOSIS — Z713 Dietary counseling and surveillance: Secondary | ICD-10-CM | POA: Diagnosis not present

## 2020-11-11 DIAGNOSIS — Z87891 Personal history of nicotine dependence: Secondary | ICD-10-CM | POA: Diagnosis not present

## 2020-11-11 DIAGNOSIS — F419 Anxiety disorder, unspecified: Secondary | ICD-10-CM | POA: Diagnosis not present

## 2020-11-11 DIAGNOSIS — Z6833 Body mass index (BMI) 33.0-33.9, adult: Secondary | ICD-10-CM | POA: Diagnosis not present

## 2020-11-16 DIAGNOSIS — Z6833 Body mass index (BMI) 33.0-33.9, adult: Secondary | ICD-10-CM | POA: Diagnosis not present

## 2020-11-16 DIAGNOSIS — Z299 Encounter for prophylactic measures, unspecified: Secondary | ICD-10-CM | POA: Diagnosis not present

## 2020-11-16 DIAGNOSIS — R109 Unspecified abdominal pain: Secondary | ICD-10-CM | POA: Diagnosis not present

## 2020-11-16 DIAGNOSIS — I10 Essential (primary) hypertension: Secondary | ICD-10-CM | POA: Diagnosis not present

## 2020-12-11 DIAGNOSIS — I10 Essential (primary) hypertension: Secondary | ICD-10-CM | POA: Diagnosis not present

## 2020-12-11 DIAGNOSIS — Z299 Encounter for prophylactic measures, unspecified: Secondary | ICD-10-CM | POA: Diagnosis not present

## 2020-12-11 DIAGNOSIS — G4730 Sleep apnea, unspecified: Secondary | ICD-10-CM | POA: Diagnosis not present

## 2020-12-11 DIAGNOSIS — M542 Cervicalgia: Secondary | ICD-10-CM | POA: Diagnosis not present

## 2020-12-21 ENCOUNTER — Ambulatory Visit (INDEPENDENT_AMBULATORY_CARE_PROVIDER_SITE_OTHER): Payer: Self-pay | Admitting: Family Medicine

## 2020-12-22 NOTE — Progress Notes (Deleted)
No show

## 2020-12-23 ENCOUNTER — Encounter: Payer: PPO | Admitting: Pain Medicine

## 2020-12-23 DIAGNOSIS — G8929 Other chronic pain: Secondary | ICD-10-CM

## 2020-12-23 DIAGNOSIS — G894 Chronic pain syndrome: Secondary | ICD-10-CM

## 2020-12-23 DIAGNOSIS — Z79899 Other long term (current) drug therapy: Secondary | ICD-10-CM

## 2020-12-23 DIAGNOSIS — M47812 Spondylosis without myelopathy or radiculopathy, cervical region: Secondary | ICD-10-CM

## 2020-12-23 DIAGNOSIS — M542 Cervicalgia: Secondary | ICD-10-CM

## 2021-01-04 ENCOUNTER — Ambulatory Visit (INDEPENDENT_AMBULATORY_CARE_PROVIDER_SITE_OTHER): Payer: Self-pay | Admitting: Family Medicine

## 2021-01-04 ENCOUNTER — Encounter: Payer: Self-pay | Admitting: Pain Medicine

## 2021-01-04 DIAGNOSIS — K219 Gastro-esophageal reflux disease without esophagitis: Secondary | ICD-10-CM | POA: Diagnosis not present

## 2021-01-04 DIAGNOSIS — E7849 Other hyperlipidemia: Secondary | ICD-10-CM | POA: Diagnosis not present

## 2021-01-04 DIAGNOSIS — I10 Essential (primary) hypertension: Secondary | ICD-10-CM | POA: Diagnosis not present

## 2021-01-05 ENCOUNTER — Other Ambulatory Visit: Payer: Self-pay

## 2021-01-05 ENCOUNTER — Ambulatory Visit: Payer: PPO | Attending: Pain Medicine | Admitting: Pain Medicine

## 2021-01-05 DIAGNOSIS — M47812 Spondylosis without myelopathy or radiculopathy, cervical region: Secondary | ICD-10-CM

## 2021-01-05 DIAGNOSIS — M5030 Other cervical disc degeneration, unspecified cervical region: Secondary | ICD-10-CM

## 2021-01-05 DIAGNOSIS — G894 Chronic pain syndrome: Secondary | ICD-10-CM | POA: Diagnosis not present

## 2021-01-05 DIAGNOSIS — M549 Dorsalgia, unspecified: Secondary | ICD-10-CM

## 2021-01-05 DIAGNOSIS — G8929 Other chronic pain: Secondary | ICD-10-CM

## 2021-01-05 DIAGNOSIS — M542 Cervicalgia: Secondary | ICD-10-CM | POA: Diagnosis not present

## 2021-01-05 NOTE — Patient Instructions (Signed)
____________________________________________________________________________________________  Preparing for Procedure with Sedation  Procedure appointments are limited to planned procedures: . No Prescription Refills. . No disability issues will be discussed. . No medication changes will be discussed.  Instructions: . Oral Intake: Do not eat or drink anything for at least 8 hours prior to your procedure. (Exception: Blood Pressure Medication. See below.) . Transportation: Unless otherwise stated by your physician, you may drive yourself after the procedure. . Blood Pressure Medicine: Do not forget to take your blood pressure medicine with a sip of water the morning of the procedure. If your Diastolic (lower reading)is above 100 mmHg, elective cases will be cancelled/rescheduled. . Blood thinners: These will need to be stopped for procedures. Notify our staff if you are taking any blood thinners. Depending on which one you take, there will be specific instructions on how and when to stop it. . Diabetics on insulin: Notify the staff so that you can be scheduled 1st case in the morning. If your diabetes requires high dose insulin, take only  of your normal insulin dose the morning of the procedure and notify the staff that you have done so. . Preventing infections: Shower with an antibacterial soap the morning of your procedure. . Build-up your immune system: Take 1000 mg of Vitamin C with every meal (3 times a day) the day prior to your procedure. . Antibiotics: Inform the staff if you have a condition or reason that requires you to take antibiotics before dental procedures. . Pregnancy: If you are pregnant, call and cancel the procedure. . Sickness: If you have a cold, fever, or any active infections, call and cancel the procedure. . Arrival: You must be in the facility at least 30 minutes prior to your scheduled procedure. . Children: Do not bring children with you. . Dress appropriately:  Bring dark clothing that you would not mind if they get stained. . Valuables: Do not bring any jewelry or valuables.  Reasons to call and reschedule or cancel your procedure: (Following these recommendations will minimize the risk of a serious complication.) . Surgeries: Avoid having procedures within 2 weeks of any surgery. (Avoid for 2 weeks before or after any surgery). . Flu Shots: Avoid having procedures within 2 weeks of a flu shots or . (Avoid for 2 weeks before or after immunizations). . Barium: Avoid having a procedure within 7-10 days after having had a radiological study involving the use of radiological contrast. (Myelograms, Barium swallow or enema study). . Heart attacks: Avoid any elective procedures or surgeries for the initial 6 months after a "Myocardial Infarction" (Heart Attack). . Blood thinners: It is imperative that you stop these medications before procedures. Let us know if you if you take any blood thinner.  . Infection: Avoid procedures during or within two weeks of an infection (including chest colds or gastrointestinal problems). Symptoms associated with infections include: Localized redness, fever, chills, night sweats or profuse sweating, burning sensation when voiding, cough, congestion, stuffiness, runny nose, sore throat, diarrhea, nausea, vomiting, cold or Flu symptoms, recent or current infections. It is specially important if the infection is over the area that we intend to treat. . Heart and lung problems: Symptoms that may suggest an active cardiopulmonary problem include: cough, chest pain, breathing difficulties or shortness of breath, dizziness, ankle swelling, uncontrolled high or unusually low blood pressure, and/or palpitations. If you are experiencing any of these symptoms, cancel your procedure and contact your primary care physician for an evaluation.  Remember:  Regular Business hours are:    Monday to Thursday 8:00 AM to 4:00 PM  Provider's  Schedule: Tzipporah Nagorski, MD:  Procedure days: Tuesday and Thursday 7:30 AM to 4:00 PM  Bilal Lateef, MD:  Procedure days: Monday and Wednesday 7:30 AM to 4:00 PM ____________________________________________________________________________________________   ____________________________________________________________________________________________  General Risks and Possible Complications  Patient Responsibilities: It is important that you read this as it is part of your informed consent. It is our duty to inform you of the risks and possible complications associated with treatments offered to you. It is your responsibility as a patient to read this and to ask questions about anything that is not clear or that you believe was not covered in this document.  Patient's Rights: You have the right to refuse treatment. You also have the right to change your mind, even after initially having agreed to have the treatment done. However, under this last option, if you wait until the last second to change your mind, you may be charged for the materials used up to that point.  Introduction: Medicine is not an exact science. Everything in Medicine, including the lack of treatment(s), carries the potential for danger, harm, or loss (which is by definition: Risk). In Medicine, a complication is a secondary problem, condition, or disease that can aggravate an already existing one. All treatments carry the risk of possible complications. The fact that a side effects or complications occurs, does not imply that the treatment was conducted incorrectly. It must be clearly understood that these can happen even when everything is done following the highest safety standards.  No treatment: You can choose not to proceed with the proposed treatment alternative. The "PRO(s)" would include: avoiding the risk of complications associated with the therapy. The "CON(s)" would include: not getting any of the treatment  benefits. These benefits fall under one of three categories: diagnostic; therapeutic; and/or palliative. Diagnostic benefits include: getting information which can ultimately lead to improvement of the disease or symptom(s). Therapeutic benefits are those associated with the successful treatment of the disease. Finally, palliative benefits are those related to the decrease of the primary symptoms, without necessarily curing the condition (example: decreasing the pain from a flare-up of a chronic condition, such as incurable terminal cancer).  General Risks and Complications: These are associated to most interventional treatments. They can occur alone, or in combination. They fall under one of the following six (6) categories: no benefit or worsening of symptoms; bleeding; infection; nerve damage; allergic reactions; and/or death. 1. No benefits or worsening of symptoms: In Medicine there are no guarantees, only probabilities. No healthcare provider can ever guarantee that a medical treatment will work, they can only state the probability that it may. Furthermore, there is always the possibility that the condition may worsen, either directly, or indirectly, as a consequence of the treatment. 2. Bleeding: This is more common if the patient is taking a blood thinner, either prescription or over the counter (example: Goody Powders, Fish oil, Aspirin, Garlic, etc.), or if suffering a condition associated with impaired coagulation (example: Hemophilia, cirrhosis of the liver, low platelet counts, etc.). However, even if you do not have one on these, it can still happen. If you have any of these conditions, or take one of these drugs, make sure to notify your treating physician. 3. Infection: This is more common in patients with a compromised immune system, either due to disease (example: diabetes, cancer, human immunodeficiency virus [HIV], etc.), or due to medications or treatments (example: therapies used to treat  cancer and   rheumatological diseases). However, even if you do not have one on these, it can still happen. If you have any of these conditions, or take one of these drugs, make sure to notify your treating physician. 4. Nerve Damage: This is more common when the treatment is an invasive one, but it can also happen with the use of medications, such as those used in the treatment of cancer. The damage can occur to small secondary nerves, or to large primary ones, such as those in the spinal cord and brain. This damage may be temporary or permanent and it may lead to impairments that can range from temporary numbness to permanent paralysis and/or brain death. 5. Allergic Reactions: Any time a substance or material comes in contact with our body, there is the possibility of an allergic reaction. These can range from a mild skin rash (contact dermatitis) to a severe systemic reaction (anaphylactic reaction), which can result in death. 6. Death: In general, any medical intervention can result in death, most of the time due to an unforeseen complication. ____________________________________________________________________________________________   

## 2021-01-05 NOTE — Progress Notes (Signed)
Patient: Jennifer Harmon  Service Category: E/M  Provider: Gaspar Cola, MD  DOB: [birth year]  DOS: 01/05/2021  Location: Office  MRN: [id number]  Setting: Ambulatory outpatient  Referring Provider: Glenda Chroman, MD  Type: Established Patient  Specialty: Interventional Pain Management  PCP: Glenda Chroman, MD  Location: Remote location  Delivery: TeleHealth     Virtual Encounter - Pain Management PROVIDER NOTE: Information contained herein reflects review and annotations entered in association with encounter. Interpretation of such information and data should be left to medically-trained personnel. Information provided to patient can be located elsewhere in the medical record under "Patient Instructions". Document created using STT-dictation technology, any transcriptional errors that may result from process are unintentional.    Contact & Pharmacy Preferred: [place]: [phone] (home) Mobile: [phone] (mobile) E-mail: [email or web address], [place] [address] Phone: [phone] Fax: [phone]   Pre-screening  [name] offered "in-person" vs "virtual" encounter. She indicated preferring virtual for this encounter.   Reason COVID-19*  Social distancing based on CDC and AMA recommendations.   I contacted [name] on 01/05/2021 via telephone.      I clearly identified myself as Gaspar Cola, MD. I verified that I was speaking with the correct person using two identifiers (Name: Jennifer Harmon, and date of birth: 04-27-1961).  Consent I sought verbal advanced consent from [name] for virtual visit interactions. I informed [name] of possible security and privacy concerns, risks, and limitations associated with providing "not-in-person" medical evaluation and management services. I also informed [name] of the availability of "in-person" appointments. Finally, I  informed her that there would be a charge for the virtual visit and that she could be  personally, fully or partially, financially responsible for it. Ms. Few expressed understanding and agreed to proceed.   Historic Elements   [name] is a 60 y.o. year old, female patient evaluated today after our last contact on 12/23/2020. [name]  has a past medical history of Acute postoperative pain (05/03/2018), Anxiety, Chronic chest pain, Depression, DJD (degenerative joint disease), Fibromyalgia, GERD (gastroesophageal reflux disease), and Hypercholesterolemia. She also  has a past surgical history that includes Cholecystectomy; Cesarean section; Back surgery (2008); Joint replacement (Right, 04/16/2019); and Breast reduction surgery (Bilateral, 05/25/2020). [name] has a current medication list which includes the following prescription(s): diclofenac sodium, famotidine, latanoprost, multi-vitamin, polyvinyl alcohol, rosuvastatin, calcium carbonate, cyclobenzaprine, esomeprazole, magnesium oxide, mupirocin ointment, and pregabalin. She  reports that she quit smoking about 5 years ago. Her smoking use included pipe and cigarettes. She has a 1.50 pack-year smoking history. She has never used smokeless tobacco. She reports that she does not drink alcohol and does not use drugs. [name] is allergic to aspirin, bupropion, ibuprofen, and tape.   HPI  Today, she is being contacted for worsening of previously known (established) problem.  The patient indicates that her neck pain is returning.  We previously had done a right-sided cervical facet block #2 under fluoroscopic guidance and IV sedation which did provide the patient with good relief of the pain.  Based on the results of her diagnostic right-sided cervical facets, I believe her to be a good candidate for the radiofrequency ablation.  However, she is now scheduled to have a left knee total knee replacement on April 7 and wants to know what  her alternatives are.  At this point  and since I will be off of vacation next week, if we were to do her radiofrequency ablation when I come back, it is likely that she will be experiencing postprocedural discomfort from the radiofrequency since it takes approximately 6 weeks to recover from radiofrequency ablations.  This would put her right on the date that she is also having the knee replacement.  I also told the patient that a better option may be to simply repeat the right cervical facet block, as soon as possible, so as to provide her with some benefit that may take her through the period of time were she will be having her knee replacement.  However, I also took the time to explain to the patient about the steroids and the fact that they will decrease her immune system for approximately 2 weeks and this increases the risk of possible perioperative infection of the knee replacement.  I told her that I would be placing an order for the procedure, but she first needs to check with her orthopedic surgeon to make sure that they think it would be okay to undergo the steroid injections, just prior to her replacement.  She understood and agreed.  Pharmacotherapy Assessment  Analgesic: Opioid analgesics DISCONTINUED secondary to unintentional overdose on 09/17/2020. MME/day: 0 mg/day.   Monitoring: [place] PMP: PDMP reviewed during this encounter.       Pharmacotherapy: No side-effects or adverse reactions reported. Compliance: No problems identified. Effectiveness: Clinically acceptable. Plan: Refer to "POC".  UDS:  Summary  Date Value Ref Range Status  03/05/2020 Note  Final    Comment:    ==================================================================== [employer] Select 13 (MW) ==================================================================== Test                             Result       Flag       Units Drug Present   7-aminoclonazepam              817                     ng/mg creat     7-aminoclonazepam is an expected metabolite of clonazepam. Source of    clonazepam is a scheduled prescription medication.   Oxycodone                      1431                    ng/mg creat   Oxymorphone                    806                     ng/mg creat   Noroxycodone                   2398                    ng/mg creat   Noroxymorphone                 150                     ng/mg creat    Sources of oxycodone are scheduled prescription medications.    Oxymorphone, noroxycodone, and noroxymorphone are expected    metabolites of oxycodone. Oxymorphone is also available as a    scheduled  prescription medication. ==================================================================== Test                      Result    Flag   Units      Ref Range   Creatinine              52               mg/dL      >=20 ==================================================================== Declared Medications:  Medication list was not provided. ==================================================================== For clinical consultation, please call [phone]. ====================================================================     Laboratory Chemistry Profile   Renal Lab Results  Component Value Date   BUN 12 09/19/2020   CREATININE 0.64 09/19/2020   GFRAA >60 11/05/2019   GFRNONAA >60 09/19/2020     Hepatic Lab Results  Component Value Date   AST 24 09/19/2020   ALT 33 09/19/2020   ALBUMIN 4.3 09/19/2020   ALKPHOS 123 09/19/2020     Electrolytes Lab Results  Component Value Date   NA 142 09/19/2020   K 3.6 09/19/2020   CL 104 09/19/2020   CALCIUM 9.8 09/19/2020   MG 2.1 09/18/2020   PHOS 3.3 09/18/2020     Bone Lab Results  Component Value Date   VD25OH 21.15 (L) 11/05/2019     Inflammation (CRP: Acute Phase) (ESR: Chronic Phase) Lab Results  Component Value Date   CRP 0.6 11/05/2019   ESRSEDRATE 21 11/05/2019       Note: Above Lab results  reviewed.  Imaging  DG PAIN CLINIC C-ARM 1-60 MIN NO REPORT Fluoro was used, but no Radiologist interpretation will be provided.  Please refer to "NOTES" tab for provider progress note.  Assessment  The primary encounter diagnosis was Chronic pain syndrome. Diagnoses of Cervical facet syndrome (Right), Cervicalgia, Chronic upper back pain (1ry area of Pain) (Bilateral) (R>L), Spondylosis without myelopathy or radiculopathy, cervical region, and DDD (degenerative disc disease), cervical were also pertinent to this visit.  Plan of Care  Problem-specific:  No problem-specific Assessment & Plan notes found for this encounter.  Ms. [name] has a current medication list which includes the following long-term medication(s): calcium carbonate, cyclobenzaprine, esomeprazole, magnesium oxide, pregabalin, and rosuvastatin.  Pharmacotherapy (Medications Ordered): No orders of the defined types were placed in this encounter.  Orders:  Orders Placed This Encounter  Procedures  . CERVICAL FACET (MEDIAL BRANCH NERVE BLOCK)     Standing Status:   Future    Standing Expiration Date:   02/05/2021    Scheduling Instructions:     Side: Right-sided     Level: C3-4, C4-5, C5-6 Facet joints (C3, C4, C5, C6, & C7 Medial Branch Nerves)     Sedation: Patient's choice.     Timeframe: ASAP    Order Specific Question:   Where will this procedure be performed?    Answer:   [facility]   Follow-up plan:   Return for Procedure (w/ sedation): (R) C-FCT BLK #3.      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  Unable to enter space despite multiple attempts on 03/19/2020.   Considering:   Diagnostic Trigger point injections Diagnostic midline caudal ESI  Possible Racz procedure (epidural Neurolysis) Diagnostic thoracic facet block Possible thoracic facet RFA Diagnostic right-sided  CESI #1  Diagnosticbilateral cervical facet block Possiblebilateral cervical facet RFA Diagnostic right IA shoulder injection Diagnostic right suprascapular NB Possibleright suprascapular RFA  Diagnostic left genicular NB Possible bilateral genicular RFA   Palliative PRN treatment(s):   Palliative right T6-7 TESI #2  Palliative bilateral lumbar facet block #4  Palliative right SI block #3  Palliative right IA hip joint injections  Palliative right lumbar facet RFA #2 (last done 07/25/2019)  Palliative right SI joint RFA #2 (last done 05/03/2018)  Palliative left  lumbar facet RFA #2 (last done 09/10/2019)  Palliative right genicular NB #2     Recent Visits Date Type Provider Dept  10/21/20 Telemedicine Milinda Pointer, MD [facility]  Showing recent visits within past 90 days and meeting all other requirements Today's Visits Date Type Provider Dept  01/05/21 Telemedicine Milinda Pointer, MD [facility]  Showing today's visits and meeting all other requirements Future Appointments No visits were found meeting these conditions. Showing future appointments within next 90 days and meeting all other requirements  I discussed the assessment and treatment plan with the patient. The patient was provided an opportunity to ask questions and all were answered. The patient agreed with the plan and demonstrated an understanding of the instructions.  Patient advised to call back or seek an in-person evaluation if the symptoms or condition worsens.  Duration of encounter: 18 minutes.  Note by: Gaspar Cola, MD Date: 01/05/2021; Time: 2:14 PM

## 2021-01-12 DIAGNOSIS — I10 Essential (primary) hypertension: Secondary | ICD-10-CM | POA: Diagnosis not present

## 2021-01-12 DIAGNOSIS — G4730 Sleep apnea, unspecified: Secondary | ICD-10-CM | POA: Diagnosis not present

## 2021-01-12 DIAGNOSIS — M1710 Unilateral primary osteoarthritis, unspecified knee: Secondary | ICD-10-CM | POA: Diagnosis not present

## 2021-01-12 DIAGNOSIS — Z299 Encounter for prophylactic measures, unspecified: Secondary | ICD-10-CM | POA: Diagnosis not present

## 2021-01-12 DIAGNOSIS — M797 Fibromyalgia: Secondary | ICD-10-CM | POA: Diagnosis not present

## 2021-01-13 DIAGNOSIS — T8484XD Pain due to internal orthopedic prosthetic devices, implants and grafts, subsequent encounter: Secondary | ICD-10-CM | POA: Diagnosis not present

## 2021-01-13 DIAGNOSIS — R262 Difficulty in walking, not elsewhere classified: Secondary | ICD-10-CM | POA: Diagnosis not present

## 2021-01-13 DIAGNOSIS — Z96652 Presence of left artificial knee joint: Secondary | ICD-10-CM | POA: Diagnosis not present

## 2021-01-16 NOTE — Progress Notes (Signed)
PROVIDER NOTE: Information contained herein reflects review and annotations entered in association with encounter. Interpretation of such information and data should be left to medically-trained personnel. Information provided to patient can be located elsewhere in the medical record under "Patient Instructions". Document created using STT-dictation technology, any transcriptional errors that may result from process are unintentional.    Patient: Jennifer Harmon  Service Category: Procedure  Provider: Oswaldo Done, MD  DOB: 1961/03/13  DOS: 01/21/2021  Location: [facility]  MRN: [id number]  Setting: Ambulatory - outpatient  Referring Provider: Ignatius Specking, MD  Type: Established Patient  Specialty: Interventional Pain Management  PCP: Ignatius Specking, MD   Primary Reason for Visit: Interventional Pain Management Treatment. CC: Neck Pain  Procedure:          Anesthesia, Analgesia, Anxiolysis:  Type: Cervical Facet Medial Branch Block(s)  #3  Primary Purpose: Diagnostic Region: Posterolateral cervical spine Level: C3, C4, C5, C6, & C7 Medial Branch Level(s). Injecting these levels blocks the C3-4, C4-5, C5-6, and C6-7 cervical facet joints. Laterality: Right  Type: Moderate (Conscious) Sedation combined with Local Anesthesia Indication(s): Analgesia and Anxiety Route: Intravenous (IV) IV Access: Secured Sedation: Meaningful verbal contact was maintained at all times during the procedure  Local Anesthetic: Lidocaine 1-2%  Position: Prone with head of the table raised to facilitate breathing.   Indications: 1. Cervical facet syndrome (Right)   2. Spondylosis without myelopathy or radiculopathy, cervical region   3. Cervicalgia   4. DDD (degenerative disc disease), cervical    Pain Score: Pre-procedure: 7 /10 Post-procedure: 0-No pain/10   Pre-op H&P Assessment:  Jennifer Harmon is a 60 y.o. (year old), female patient, seen today for interventional treatment. She  has  a past surgical history that includes Cholecystectomy; Cesarean section; Back surgery (2008); Joint replacement (Right, 04/16/2019); and Breast reduction surgery (Bilateral, 05/25/2020). Jennifer Harmon has a current medication list which includes the following prescription(s): calcium carbonate, cyclobenzaprine, diclofenac sodium, esomeprazole, famotidine, latanoprost, magnesium oxide, multi-vitamin, mupirocin ointment, polyvinyl alcohol, pregabalin, rosuvastatin, and meloxicam, and the following Facility-Administered Medications: fentanyl and midazolam. Her primarily concern today is the Neck Pain  Initial Vital Signs:  Pulse/HCG Rate: 85ECG Heart Rate: 100 Temp: (!) 97.2 F (36.2 C) Resp: 18 BP: 127/60 SpO2: 98 %  BMI: Estimated body mass index is 33.66 kg/m as calculated from the following:   Height as of this encounter: 5\' 3"  (1.6 m).   Weight as of this encounter: 190 lb (86.2 kg).  Risk Assessment: Allergies: Reviewed. She is allergic to aspirin, bupropion, ibuprofen, and tape.  Allergy Precautions: None required Coagulopathies: Reviewed. None identified.  Blood-thinner therapy: None at this time Active Infection(s): Reviewed. None identified. Jennifer Harmon is afebrile  Site Confirmation: Jennifer Harmon was asked to confirm the procedure and laterality before marking the site Procedure checklist: Completed Consent: Before the procedure and under the influence of no sedative(s), amnesic(s), or anxiolytics, the patient was informed of the treatment options, risks and possible complications. To fulfill our ethical and legal obligations, as recommended by the American Medical Association's Code of Ethics, I have informed the patient of my clinical impression; the nature and purpose of the treatment or procedure; the risks, benefits, and possible complications of the intervention; the alternatives, including doing nothing; the risk(s) and benefit(s) of the alternative treatment(s) or procedure(s);  and the risk(s) and benefit(s) of doing nothing. The patient was provided information about the general risks and possible complications associated with the procedure. These may include, but  are not limited to: failure to achieve desired goals, infection, bleeding, organ or nerve damage, allergic reactions, paralysis, and death. In addition, the patient was informed of those risks and complications associated to Spine-related procedures, such as failure to decrease pain; infection (i.e.: Meningitis, epidural or intraspinal abscess); bleeding (i.e.: epidural hematoma, subarachnoid hemorrhage, or any other type of intraspinal or peri-dural bleeding); organ or nerve damage (i.e.: Any type of peripheral nerve, nerve root, or spinal cord injury) with subsequent damage to sensory, motor, and/or autonomic systems, resulting in permanent pain, numbness, and/or weakness of one or several areas of the body; allergic reactions; (i.e.: anaphylactic reaction); and/or death. Furthermore, the patient was informed of those risks and complications associated with the medications. These include, but are not limited to: allergic reactions (i.e.: anaphylactic or anaphylactoid reaction(s)); adrenal axis suppression; blood sugar elevation that in diabetics may result in ketoacidosis or comma; water retention that in patients with history of congestive heart failure may result in shortness of breath, pulmonary edema, and decompensation with resultant heart failure; weight gain; swelling or edema; medication-induced neural toxicity; particulate matter embolism and blood vessel occlusion with resultant organ, and/or nervous system infarction; and/or aseptic necrosis of one or more joints. Finally, the patient was informed that Medicine is not an exact science; therefore, there is also the possibility of unforeseen or unpredictable risks and/or possible complications that may result in a catastrophic outcome. The patient indicated having  understood very clearly. We have given the patient no guarantees and we have made no promises. Enough time was given to the patient to ask questions, all of which were answered to the patient's satisfaction. Ms. Lajeunesse has indicated that she wanted to continue with the procedure. Attestation: I, the ordering provider, attest that I have discussed with the patient the benefits, risks, side-effects, alternatives, likelihood of achieving goals, and potential problems during recovery for the procedure that I have provided informed consent. Date   Time: 01/21/2021  1:48 PM  Pre-Procedure Preparation:  Monitoring: As per clinic protocol. Respiration, ETCO2, SpO2, BP, heart rate and rhythm monitor placed and checked for adequate function Safety Precautions: Patient was assessed for positional comfort and pressure points before starting the procedure. Time-out: I initiated and conducted the "Time-out" before starting the procedure, as per protocol. The patient was asked to participate by confirming the accuracy of the "Time Out" information. Verification of the correct person, site, and procedure were performed and confirmed by me, the nursing staff, and the patient. "Time-out" conducted as per Joint Commission's Universal Protocol (UP.01.01.01). Time: 1428  Description of Procedure:          Laterality: Right Level: C3, C4, C5, C6, & C7 Medial Branch Level(s). Area Prepped: Posterior Cervico-thoracic Region DuraPrep (Iodine Povacrylex [0.7% available iodine] and Isopropyl Alcohol, 74% w/w) Safety Precautions: Aspiration looking for blood return was conducted prior to all injections. At no point did we inject any substances, as a needle was being advanced. Before injecting, the patient was told to immediately notify me if she was experiencing any new onset of "ringing in the ears, or metallic taste in the mouth". No attempts were made at seeking any paresthesias. Safe injection practices and needle disposal  techniques used. Medications properly checked for expiration dates. SDV (single dose vial) medications used. After the completion of the procedure, all disposable equipment used was discarded in the proper designated medical waste containers. Local Anesthesia: Protocol guidelines were followed. The patient was positioned over the fluoroscopy table. The area was prepped in the  usual manner. The time-out was completed. The target area was identified using fluoroscopy. A 12-in long, straight, sterile hemostat was used with fluoroscopic guidance to locate the targets for each level blocked. Once located, the skin was marked with an approved surgical skin marker. Once all sites were marked, the skin (epidermis, dermis, and hypodermis), as well as deeper tissues (fat, connective tissue and muscle) were infiltrated with a small amount of a short-acting local anesthetic, loaded on a 10cc syringe with a 25G, 1.5-in  Needle. An appropriate amount of time was allowed for local anesthetics to take effect before proceeding to the next step. Local Anesthetic: Lidocaine 2.0% The unused portion of the local anesthetic was discarded in the proper designated containers. Technical explanation of process:  C3 Medial Branch Nerve Block (MBB): The target area for the C3 dorsal medial articular branch is the lateral concave waist of the articular pillar of C3. Under fluoroscopic guidance, a Quincke needle was inserted until contact was made with os over the postero-lateral aspect of the articular pillar of C3 (target area). After negative aspiration for blood, 0.5 mL of the nerve block solution was injected without difficulty or complication. The needle was removed intact. C4 Medial Branch Nerve Block (MBB): The target area for the C4 dorsal medial articular branch is the lateral concave waist of the articular pillar of C4. Under fluoroscopic guidance, a Quincke needle was inserted until contact was made with os over the  postero-lateral aspect of the articular pillar of C4 (target area). After negative aspiration for blood, 0.5 mL of the nerve block solution was injected without difficulty or complication. The needle was removed intact. C5 Medial Branch Nerve Block (MBB): The target area for the C5 dorsal medial articular branch is the lateral concave waist of the articular pillar of C5. Under fluoroscopic guidance, a Quincke needle was inserted until contact was made with os over the postero-lateral aspect of the articular pillar of C5 (target area). After negative aspiration for blood, 0.5 mL of the nerve block solution was injected without difficulty or complication. The needle was removed intact. C6 Medial Branch Nerve Block (MBB): The target area for the C6 dorsal medial articular branch is the lateral concave waist of the articular pillar of C6. Under fluoroscopic guidance, a Quincke needle was inserted until contact was made with os over the postero-lateral aspect of the articular pillar of C6 (target area). After negative aspiration for blood, 0.5 mL of the nerve block solution was injected without difficulty or complication. The needle was removed intact. C7 Medial Branch Nerve Block (MBB): The target for the C7 dorsal medial articular branch lies on the superior-medial tip of the C7 transverse process. Under fluoroscopic guidance, a Quincke needle was inserted until contact was made with os over the postero-lateral aspect of the articular pillar of C7 (target area). After negative aspiration for blood, 0.5 mL of the nerve block solution was injected without difficulty or complication. The needle was removed intact. Procedural Needles: 22-gauge, 3.5-inch, Quincke needles used for all levels. Nerve block solution: 0.2% PF-Ropivacaine + Triamcinolone (40 mg/mL) diluted to a final concentration of 4 mg of Triamcinolone/mL of Ropivacaine The unused portion of the solution was discarded in the proper designated  containers.  Once the entire procedure was completed, the treated area was cleaned, making sure to leave some of the prepping solution back to take advantage of its long term bactericidal properties.  Anatomy Reference Guide:       Vitals:   01/21/21 1437 01/21/21  1444 01/21/21 1453 01/21/21 1503  BP: (!) 141/90 (!) 140/99 129/77 124/62  Pulse:      Resp: Temp:  (!) 97.5 F (36.4 C)  97.9 F (36.6 C)  TempSrc:  Temporal  Temporal  SpO2: 100% 100% 95% 96%  Weight:      Height:        Start Time: 1428 hrs. End Time: 1436 hrs.  Imaging Guidance (Spinal):          Type of Imaging Technique: Fluoroscopy Guidance (Spinal) Indication(s): Assistance in needle guidance and placement for procedures requiring needle placement in or near specific anatomical locations not easily accessible without such assistance. Exposure Time: Please see nurses notes. Contrast: None used. Fluoroscopic Guidance: I was personally present during the use of fluoroscopy. "Tunnel Vision Technique" used to obtain the best possible view of the target area. Parallax error corrected before commencing the procedure. "Direction-depth-direction" technique used to introduce the needle under continuous pulsed fluoroscopy. Once target was reached, antero-posterior, oblique, and lateral fluoroscopic projection used confirm needle placement in all planes. Images permanently stored in EMR. Interpretation: No contrast injected. I personally interpreted the imaging intraoperatively. Adequate needle placement confirmed in multiple planes. Permanent images saved into the patient's record.  Antibiotic Prophylaxis:   Anti-infectives (From admission, onward)   None     Indication(s): None identified  Post-operative Assessment:  Post-procedure Vital Signs:  Pulse/HCG Rate: 8595 Temp: 97.9 F (36.6 C) Resp: 19 BP: 124/62 SpO2: 96 %  EBL: None  Complications: No immediate post-treatment complications observed  by team, or reported by patient.  Note: The patient tolerated the entire procedure well. A repeat set of vitals were taken after the procedure and the patient was kept under observation following institutional policy, for this type of procedure. Post-procedural neurological assessment was performed, showing return to baseline, prior to discharge. The patient was provided with post-procedure discharge instructions, including a section on how to identify potential problems. Should any problems arise concerning this procedure, the patient was given instructions to immediately contact us, at any time, without hesitation. In any case, we plan to contact the patient by telephone for a follow-up status report regarding this interventional procedure.  Comments:  No additional relevant information.  Plan of Care  Orders:  Orders Placed This Encounter  Procedures   CERVICAL FACET (MEDIAL BRANCH NERVE BLOCK)     Scheduling Instructions:     Side: Right-sided     Level: C3-4, C4-5, C5-6 Facet joints (C3, C4, C5, C6, & C7 Medial Branch Nerves)     Sedation: Patient's choice.     Timeframe: Today    Order Specific Question:   Where will this procedure be performed?    Answer:   [facility]   DG [facility] C-ARM 1-60 MIN NO REPORT    Intraoperative interpretation by procedural physician at [facility].    Standing Status:   Standing    Number of Occurrences:   1    Order Specific Question:   Reason for exam:    Answer:   Assistance in needle guidance and placement for procedures requiring needle placement in or near specific anatomical locations not easily accessible without such assistance.   Informed Consent Details: Physician/Practitioner Attestation; Transcribe to consent form and obtain patient signature    Note: Always confirm laterality of pain with Ms. Ure, before procedure. Transcribe to consent form and obtain patient signature.    Order Specific Question:    Physician/Practitioner attestation  of informed consent for procedure/surgical case    Answer:   I, the physician/practitioner, attest that I have discussed with the patient the benefits, risks, side effects, alternatives, likelihood of achieving goals and potential problems during recovery for the procedure that I have provided informed consent.    Order Specific Question:   Procedure    Answer:   Right Cervical facet block under fluoroscopic guidance.    Order Specific Question:   Physician/Practitioner performing the procedure    Answer:   Nandan Willems A. Laban Emperor, MD    Order Specific Question:   Indication/Reason    Answer:   Chronic neck pain secondary to cervical facet syndrome   Provide equipment / supplies at bedside    "Block Tray" (Disposable   single use) Needle type: SpinalSpinal Amount/quantity: 5 Size: Regular (3.5-inch) Gauge: 22G    Standing Status:   Standing    Number of Occurrences:   1    Order Specific Question:   Specify    Answer:   Block Tray   Chronic Opioid Analgesic:  Opioid analgesics DISCONTINUED secondary to unintentional overdose on 09/17/2020. MME/day: 0 mg/day.   Medications ordered for procedure: Meds ordered this encounter  Medications   lidocaine (XYLOCAINE) 2 % (with pres) injection 400 mg   lactated ringers infusion 1,000 mL   midazolam (VERSED) 5 MG/5ML injection 1-2 mg    Make sure Flumazenil is available in the pyxis when using this medication. If oversedation occurs, administer 0.2 mg IV over 15 sec. If after 45 sec no response, administer 0.2 mg again over 1 min; may repeat at 1 min intervals; not to exceed 4 doses (1 mg)   fentaNYL (SUBLIMAZE) injection 25-50 mcg    Make sure Narcan is available in the pyxis when using this medication. In the event of respiratory depression (RR< 8/min): Titrate NARCAN (naloxone) in increments of 0.1 to 0.2 mg IV at 2-3 minute intervals, until desired degree of reversal.   ropivacaine (PF) 2 mg/mL (0.2%)  (NAROPIN) injection 9 mL   dexamethasone (DECADRON) injection 10 mg   Medications administered: We administered lidocaine, lactated ringers, midazolam, fentaNYL, ropivacaine (PF) 2 mg/mL (0.2%), and dexamethasone.  See the medical record for exact dosing, route, and time of administration.  Follow-up plan:   Return for on afternoon of procedure day, (F2F), (PPE).       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  Unable to enter space despite multiple attempts on 03/19/2020.   Considering:   Diagnostic Trigger point injections Diagnostic midline caudal ESI  Possible Racz procedure (epidural Neurolysis) Diagnostic thoracic facet block Possible thoracic facet RFA Diagnostic right-sided CESI #1  Diagnosticbilateral cervical facet block Possiblebilateral cervical facet RFA Diagnostic right IA shoulder injection Diagnostic right suprascapular NB Possibleright suprascapular RFA Diagnostic left genicular NB Possible bilateral genicular RFA   Palliative PRN treatment(s):   Palliative right T6-7 TESI #2  Palliative bilateral lumbar facet block #4  Palliative right SI block #3  Palliative right IA hip joint injections  Palliative right lumbar facet RFA #2 (last done 07/25/2019)  Palliative right SI joint RFA #2 (last done 05/03/2018)  Palliative left  lumbar facet RFA #2 (last done 09/10/2019)  Palliative right genicular NB #2      Recent Visits Date Type Provider Dept  01/05/21 Telemedicine Delano Metz, MD [facility]  Showing recent visits within past 90 days and meeting all other requirements Today's Visits Date  Type Provider Dept  01/21/21 Procedure visit Delano Metz, MD [facility]  Showing today's visits and meeting all other requirements Future Appointments Date Type Provider Dept  02/04/21 Appointment Delano Metz,  MD [facility]  Showing future appointments within next 90 days and meeting all other requirements  Disposition: Discharge home  Discharge (Date   Time): 01/21/2021; 1509 hrs.   Primary Care Physician: Ignatius Specking, MD Location: [facility] Note by: Oswaldo Done, MD Date: 01/21/2021; Time: 3:17 PM  Disclaimer:  Medicine is not an Visual merchandiser. The only guarantee in medicine is that nothing is guaranteed. It is important to note that the decision to proceed with this intervention was based on the information collected from the patient. The Data and conclusions were drawn from the patient's questionnaire, the interview, and the physical examination. Because the information was provided in large part by the patient, it cannot be guaranteed that it has not been purposely or unconsciously manipulated. Every effort has been made to obtain as much relevant data as possible for this evaluation. It is important to note that the conclusions that lead to this procedure are derived in large part from the available data. Always take into account that the treatment will also be dependent on availability of resources and existing treatment guidelines, considered by other Pain Management Practitioners as being common knowledge and practice, at the time of the intervention. For Medico-Legal purposes, it is also important to point out that variation in procedural techniques and pharmacological choices are the acceptable norm. The indications, contraindications, technique, and results of the above procedure should only be interpreted and judged by a Board-Certified Interventional Pain Specialist with extensive familiarity and expertise in the same exact procedure and technique.

## 2021-01-20 DIAGNOSIS — Z96652 Presence of left artificial knee joint: Secondary | ICD-10-CM | POA: Diagnosis not present

## 2021-01-20 DIAGNOSIS — M1712 Unilateral primary osteoarthritis, left knee: Secondary | ICD-10-CM | POA: Diagnosis not present

## 2021-01-20 DIAGNOSIS — M25562 Pain in left knee: Secondary | ICD-10-CM | POA: Diagnosis not present

## 2021-01-20 DIAGNOSIS — M21162 Varus deformity, not elsewhere classified, left knee: Secondary | ICD-10-CM | POA: Diagnosis not present

## 2021-01-20 DIAGNOSIS — M1612 Unilateral primary osteoarthritis, left hip: Secondary | ICD-10-CM | POA: Diagnosis not present

## 2021-01-20 DIAGNOSIS — T84038A Mechanical loosening of other internal prosthetic joint, initial encounter: Secondary | ICD-10-CM | POA: Diagnosis not present

## 2021-01-20 DIAGNOSIS — G8929 Other chronic pain: Secondary | ICD-10-CM | POA: Diagnosis not present

## 2021-01-20 DIAGNOSIS — R7303 Prediabetes: Secondary | ICD-10-CM | POA: Insufficient documentation

## 2021-01-20 DIAGNOSIS — T8484XA Pain due to internal orthopedic prosthetic devices, implants and grafts, initial encounter: Secondary | ICD-10-CM | POA: Diagnosis not present

## 2021-01-21 ENCOUNTER — Ambulatory Visit
Admission: RE | Admit: 2021-01-21 | Discharge: 2021-01-21 | Disposition: A | Discharge status: Home / Self Care | Payer: PPO | Source: Ambulatory Visit | Attending: Pain Medicine | Admitting: Pain Medicine

## 2021-01-21 ENCOUNTER — Encounter: Payer: Self-pay | Admitting: Pain Medicine

## 2021-01-21 ENCOUNTER — Ambulatory Visit (HOSPITAL_BASED_OUTPATIENT_CLINIC_OR_DEPARTMENT_OTHER): Payer: PPO | Admitting: Pain Medicine

## 2021-01-21 ENCOUNTER — Other Ambulatory Visit: Payer: Self-pay

## 2021-01-21 VITALS — BP 124/62 | HR 85 | Temp 97.9°F | Resp 19 | Ht 63.0 in | Wt 190.0 lb

## 2021-01-21 DIAGNOSIS — M47812 Spondylosis without myelopathy or radiculopathy, cervical region: Secondary | ICD-10-CM

## 2021-01-21 DIAGNOSIS — M542 Cervicalgia: Secondary | ICD-10-CM

## 2021-01-21 DIAGNOSIS — M5030 Other cervical disc degeneration, unspecified cervical region: Secondary | ICD-10-CM | POA: Insufficient documentation

## 2021-01-21 MED ORDER — FENTANYL CITRATE (PF) 100 MCG/2ML IJ SOLN
25.0000 ug | INTRAMUSCULAR | Status: DC | PRN
  Administered 2021-01-21: 100 ug via INTRAVENOUS

## 2021-01-21 MED ORDER — DEXAMETHASONE SODIUM PHOSPHATE 10 MG/ML IJ SOLN
10.0000 mg | Freq: Once | INTRAMUSCULAR | Status: AC
  Administered 2021-01-21: 10 mg

## 2021-01-21 MED ORDER — MIDAZOLAM HCL 5 MG/5ML IJ SOLN
1.0000 mg | INTRAMUSCULAR | Status: DC | PRN
  Administered 2021-01-21: 3 mg via INTRAVENOUS

## 2021-01-21 MED ORDER — LACTATED RINGERS IV SOLN
1000.0000 mL | Freq: Once | INTRAVENOUS | Status: AC
  Administered 2021-01-21: 1000 mL via INTRAVENOUS

## 2021-01-21 MED ORDER — ROPIVACAINE HCL 2 MG/ML IJ SOLN
9.0000 mL | Freq: Once | INTRAMUSCULAR | Status: AC
  Administered 2021-01-21: 9 mL via PERINEURAL

## 2021-01-21 MED ORDER — DEXAMETHASONE SODIUM PHOSPHATE 10 MG/ML IJ SOLN
INTRAMUSCULAR | Status: AC
  Filled 2021-01-21: qty 1

## 2021-01-21 MED ORDER — LIDOCAINE HCL 2 % IJ SOLN
INTRAMUSCULAR | Status: AC
  Filled 2021-01-21: qty 20

## 2021-01-21 MED ORDER — ROPIVACAINE HCL 2 MG/ML IJ SOLN
INTRAMUSCULAR | Status: AC
  Filled 2021-01-21: qty 10

## 2021-01-21 MED ORDER — LIDOCAINE HCL 2 % IJ SOLN
20.0000 mL | Freq: Once | INTRAMUSCULAR | Status: AC
  Administered 2021-01-21: 400 mg

## 2021-01-21 MED ORDER — FENTANYL CITRATE (PF) 100 MCG/2ML IJ SOLN
INTRAMUSCULAR | Status: AC
  Filled 2021-01-21: qty 2

## 2021-01-21 MED ORDER — MIDAZOLAM HCL 5 MG/5ML IJ SOLN
INTRAMUSCULAR | Status: AC
  Filled 2021-01-21: qty 5

## 2021-01-21 NOTE — Patient Instructions (Signed)

## 2021-01-21 NOTE — Progress Notes (Signed)
Safety precautions to be maintained throughout the outpatient stay will include: orient to surroundings, keep bed in low position, maintain call bell within reach at all times, provide assistance with transfer out of bed and ambulation.  

## 2021-01-22 ENCOUNTER — Telehealth: Payer: Self-pay

## 2021-01-22 NOTE — Telephone Encounter (Signed)
Post procedure phone call.  Patient states she is doing good, pain free.

## 2021-02-01 DIAGNOSIS — Z299 Encounter for prophylactic measures, unspecified: Secondary | ICD-10-CM | POA: Diagnosis not present

## 2021-02-01 DIAGNOSIS — F339 Major depressive disorder, recurrent, unspecified: Secondary | ICD-10-CM | POA: Diagnosis not present

## 2021-02-01 DIAGNOSIS — Z1331 Encounter for screening for depression: Secondary | ICD-10-CM | POA: Diagnosis not present

## 2021-02-01 DIAGNOSIS — I10 Essential (primary) hypertension: Secondary | ICD-10-CM | POA: Diagnosis not present

## 2021-02-01 DIAGNOSIS — F419 Anxiety disorder, unspecified: Secondary | ICD-10-CM | POA: Diagnosis not present

## 2021-02-03 DIAGNOSIS — K219 Gastro-esophageal reflux disease without esophagitis: Secondary | ICD-10-CM | POA: Diagnosis not present

## 2021-02-03 DIAGNOSIS — R32 Unspecified urinary incontinence: Secondary | ICD-10-CM | POA: Diagnosis not present

## 2021-02-03 DIAGNOSIS — E7849 Other hyperlipidemia: Secondary | ICD-10-CM | POA: Diagnosis not present

## 2021-02-03 NOTE — Progress Notes (Signed)
Patient: Jennifer Harmon  Service Category: E/M  Provider: Gaspar Cola, MD  DOB: Jun 01, 1961  DOS: 02/04/2021  Location: Office  MRN: [id number]  Setting: Ambulatory outpatient  Referring Provider: Glenda Chroman, MD  Type: Established Patient  Specialty: Interventional Pain Management  PCP: Glenda Chroman, MD  Location: Remote location  Delivery: TeleHealth     Virtual Encounter - Pain Management PROVIDER NOTE: Information contained herein reflects review and annotations entered in association with encounter. Interpretation of such information and data should be left to medically-trained personnel. Information provided to patient can be located elsewhere in the medical record under "Patient Instructions". Document created using STT-dictation technology, any transcriptional errors that may result from process are unintentional.    Contact & Pharmacy Preferred: [place]: [phone] (home) Mobile: [phone] (mobile) E-mail: [email or web address], [place] [address] Phone: [phone] Fax: [phone]   Pre-screening  Jennifer Harmon offered "in-person" vs "virtual" encounter. She indicated preferring virtual for this encounter.   Reason COVID-19*  Social distancing based on CDC and AMA recommendations.   I contacted Jennifer Harmon on 02/04/2021 via telephone.      I clearly identified myself as Gaspar Cola, MD. I verified that I was speaking with the correct person using two identifiers (Name: Jennifer Harmon, and date of birth: July 04, 1961).  Consent I sought verbal advanced consent from Jennifer Harmon for virtual visit interactions. I informed Jennifer Harmon of possible security and privacy concerns, risks, and limitations associated with providing "not-in-person" medical evaluation and management services. I also informed Jennifer Harmon of the availability of "in-person" appointments. Finally, I  informed her that there would be a charge for the virtual visit and that she could be  personally, fully or partially, financially responsible for it. Jennifer Harmon expressed understanding and agreed to proceed.   Historic Elements   Jennifer Harmon is a 60 y.o. year old, female patient evaluated today after our last contact on 01/21/2021. Jennifer Harmon  has a past medical history of Acute postoperative pain (05/03/2018), Anxiety, Chronic chest pain, Depression, DJD (degenerative joint disease), Fibromyalgia, GERD (gastroesophageal reflux disease), and Hypercholesterolemia. She also  has a past surgical history that includes Cholecystectomy; Cesarean section; Back surgery (2008); Joint replacement (Right, 04/16/2019); and Breast reduction surgery (Bilateral, 05/25/2020). Jennifer Harmon has a current medication list which includes the following prescription(s): calcium carbonate, cyclobenzaprine, diclofenac sodium, esomeprazole, famotidine, latanoprost, magnesium oxide, meloxicam, multi-vitamin, mupirocin ointment, polyvinyl alcohol, pregabalin, and rosuvastatin. She  reports that she quit smoking about 5 years ago. Her smoking use included pipe and cigarettes. She has a 1.50 pack-year smoking history. She has never used smokeless tobacco. She reports that she does not drink alcohol and does not use drugs. Jennifer Harmon is allergic to aspirin, bupropion, ibuprofen, and tape.   HPI  Today, she is being contacted for a post-procedure assessment.  The patient was originally scheduled to return for a face-to-face post procedure evaluation, but she is having knee surgery on the seventh, and she states that she has been told to stay at home to minimize her chances of being exposed to COVID-19.  Because of this, the appointment was changed from a face-to-face encounter to a virtual visit.    The patient refers having attained 100% relief of the pain for the duration of the local anesthetic which actually lasted for  approximately 3 to 4 days after which then  the pain started coming back.  At this point, she refers still enjoying an ongoing 50% improvement over what she had prior to the cervical facet blocks.  Today we talked about the future treatments and alternatives and I have explained to the patient that she would probably benefit from radiofrequency ablation of the cervical facets.  Today she confirmed that she has also been having some cervicogenic headaches which actually stayed away while the numbing medicine was in place.  Today she asked me if there was anything that I could do for her until she can get her knee surgery on the seventh.  This is just a week away therefore it is not likely that I will be able to do anything in particular for her but, I have informed her that in view of the benefits that she obtained and the relief that she attained for the duration of the local anesthetic, I believe her to be a good candidate for that radiofrequency ablation.  I reminded her that this is the same type of treatments that we have offered for her in the lumbar region and so she is familiar with them.  Today we have agreed to talk again after her knee surgery to see if we can set that radiofrequency ablation once she is cleared by the orthopedic surgeons.  Post-Procedure Evaluation  Procedure (01/21/2021): Therapeutic/diagnostic right cervical facet block #3 under fluoroscopic guidance and IV sedation Pre-procedure pain level: 7/10 Post-procedure: 0/10 (100% relief)  Sedation: Sedation provided.  Effectiveness during initial hour after procedure(Ultra-Harmon Term Relief):  100%.  Local anesthetic used: Long-acting (4-6 hours) Effectiveness: Defined as any analgesic benefit obtained secondary to the administration of local anesthetics. This carries significant diagnostic value as to the etiological location, or anatomical origin, of the pain. Duration of benefit is expected to coincide with the duration of the  local anesthetic used.  Effectiveness during initial 4-6 hours after procedure(Harmon-Term Relief):  100%.  Long-term benefit: Defined as any relief past the pharmacologic duration of the local anesthetics.  Effectiveness past the initial 6 hours after procedure(Long-Term Relief):  100% x 3-4 days.  Current benefits: Defined as benefit that persist at this time.   Analgesia:  Ongoing 50% relief of the pain. Function: Jennifer Harmon reports improvement in function ROM: Jennifer Harmon reports improvement in ROM  Pharmacotherapy Assessment  Analgesic: Opioid analgesics DISCONTINUED secondary to unintentional overdose on 09/17/2020. MME/day: 0 mg/day.   Monitoring: [place] PMP: PDMP reviewed during this encounter.       Pharmacotherapy: No side-effects or adverse reactions reported. Compliance: No problems identified. Effectiveness: Clinically acceptable. Plan: Refer to "POC".  UDS:  Summary  Date Value Ref Range Status  03/05/2020 Note  Final    Comment:    ==================================================================== [facility] Select 13 (MW) ==================================================================== Test                             Result       Flag       Units Drug Present   7-aminoclonazepam              817                     ng/mg creat    7-aminoclonazepam is an expected metabolite of clonazepam. Source of    clonazepam is a scheduled prescription medication.   Oxycodone  1431                    ng/mg creat   Oxymorphone                    806                     ng/mg creat   Noroxycodone                   2398                    ng/mg creat   Noroxymorphone                 150                     ng/mg creat    Sources of oxycodone are scheduled prescription medications.    Oxymorphone, noroxycodone, and noroxymorphone are expected    metabolites of oxycodone. Oxymorphone is also available as a    scheduled prescription  medication. ==================================================================== Test                      Result    Flag   Units      Ref Range   Creatinine              52               mg/dL      >=20 ==================================================================== Declared Medications:  Medication list was not provided. ==================================================================== For clinical consultation, please call [phone]. ====================================================================     Laboratory Chemistry Profile   Renal Lab Results  Component Value Date   BUN 12 09/19/2020   CREATININE 0.64 09/19/2020   GFRAA >60 11/05/2019   GFRNONAA >60 09/19/2020     Hepatic Lab Results  Component Value Date   AST 24 09/19/2020   ALT 33 09/19/2020   ALBUMIN 4.3 09/19/2020   ALKPHOS 123 09/19/2020     Electrolytes Lab Results  Component Value Date   NA 142 09/19/2020   K 3.6 09/19/2020   CL 104 09/19/2020   CALCIUM 9.8 09/19/2020   MG 2.1 09/18/2020   PHOS 3.3 09/18/2020     Bone Lab Results  Component Value Date   VD25OH 21.15 (L) 11/05/2019     Inflammation (CRP: Acute Phase) (ESR: Chronic Phase) Lab Results  Component Value Date   CRP 0.6 11/05/2019   ESRSEDRATE 21 11/05/2019       Note: Above Lab results reviewed.  Imaging  DG PAIN CLINIC C-ARM 1-60 MIN NO REPORT Fluoro was used, but no Radiologist interpretation will be provided.  Please refer to "NOTES" tab for provider progress note.  Assessment  The primary encounter diagnosis was Cervical facet syndrome (Right). Diagnoses of Cervicalgia, DDD (degenerative disc disease), cervical, Spondylosis without myelopathy or radiculopathy, cervical region, and Chronic pain syndrome were also pertinent to this visit.  Plan of Care  Problem-specific:  No problem-specific Assessment & Plan notes found for this encounter.  Jennifer Harmon has a current medication list which  includes the following long-term medication(s): calcium carbonate, cyclobenzaprine, esomeprazole, magnesium oxide, pregabalin, and rosuvastatin.  Pharmacotherapy (Medications Ordered): No orders of the defined types were placed in this encounter.  Orders:  Orders Placed This Encounter  Procedures  . Radiofrequency,Cervical    Standing Status:   Standing    Number of Occurrences:  1    Standing Expiration Date:   02/04/2022    Scheduling Instructions:     Side(s): Right-sided     Level(s): C3, C4, C5, C6, & C7 Medial Branch Nerve(s)     Sedation: With Sedation.     Scheduling Timeframe: As soon as pre-approved    Order Specific Question:   Where will this procedure be performed?    Answer:   [facility]   Follow-up plan:   Return if symptoms worsen or fail to improve, for PRN RFA (13mn): (R) C-FCT RFA #1.      Interventional Therapies  Risk  Complexity Considerations:   Estimated body mass index is 33.66 kg/m as calculated from the following:   Height as of 01/21/21: '5\' 3"'  (1.6 m).   Weight as of 01/21/21: 190 lb (86.2 kg). NOTE: The L3-4 interlaminar space is fused with calcification of the ligament.  Unable to enter space despite multiple attempts on 03/19/2020.   Planned  Pending:   Therapeutic right cervical facet RFA #1    Under consideration:   Diagnostic midline caudal ESI  Diagnostic thoracic facet block Diagnostic right IA shoulder injection Diagnostic right suprascapular NB Diagnostic left genicular NB   Completed:   Therapeutic bilateral quadratus lumborum and erector spinae muscle TPI x1 (11/05/2019)  Therapeutic right cervical ESI x1 (05/07/2020)  Therapeutic right cervical facet block x3 (01/21/2021) (100/100/100/50)  Therapeutic right L3 TFESI x1 (03/19/2020)  Failed attempt at midline L3-4 LESI (03/19/2020) Therapeutic right T6-7 TESI x1 (11/21/2017)  Therapeutic right IA hip joint injections x1 (01/18/2018)  Therapy right lumbar facet MBB x3  (05/30/2019)  Therapy left lumbar facet MBB x3 (05/30/2019)  Therapeutic right lumbar facet RFA x2 (07/25/2019)  Therapeutic left  lumbar facet RFA x2 (09/10/2019)  Therapeutic right SI block x2 (01/18/2018)  Therapeutic right SI joint RFA x1 (05/03/2018)  Therapeutic right genicular NB x1 (01/10/2019)    Therapeutic  Palliative (PRN) options:   Palliative right T6-7 TESI #2  Palliative bilateral lumbar facet MBB #4  Palliative right SI block #3  Palliative right IA hip joint injections  Palliative right lumbar facet RFA #3  Palliative right SI joint RFA #2  Palliative left  lumbar facet RFA #3  Palliative right genicular NB #2     Recent Visits Date Type Provider Dept  01/21/21 Procedure visit NMilinda Pointer MD [facility]  01/05/21 Telemedicine NMilinda Pointer MD [facility]  Showing recent visits within past 90 days and meeting all other requirements Today's Visits Date Type Provider Dept  02/04/21 Telemedicine NMilinda Pointer MD [facility]  Showing today's visits and meeting all other requirements Future Appointments No visits were found meeting these conditions. Showing future appointments within next 90 days and meeting all other requirements  I discussed the assessment and treatment plan with the patient. The patient was provided an opportunity to ask questions and all were answered. The patient agreed with the plan and demonstrated an understanding of the instructions.  Patient advised to call back or seek an in-person evaluation if the symptoms or condition worsens.  Duration of encounter: 20 minutes.  Note by: FGaspar Cola MD Date: 02/04/2021; Time: 3:44 PM

## 2021-02-04 ENCOUNTER — Ambulatory Visit: Payer: PPO | Attending: Pain Medicine | Admitting: Pain Medicine

## 2021-02-04 ENCOUNTER — Telehealth: Payer: Self-pay

## 2021-02-04 ENCOUNTER — Other Ambulatory Visit: Payer: Self-pay

## 2021-02-04 DIAGNOSIS — M542 Cervicalgia: Secondary | ICD-10-CM

## 2021-02-04 DIAGNOSIS — G894 Chronic pain syndrome: Secondary | ICD-10-CM | POA: Diagnosis not present

## 2021-02-04 DIAGNOSIS — M5030 Other cervical disc degeneration, unspecified cervical region: Secondary | ICD-10-CM

## 2021-02-04 DIAGNOSIS — M47812 Spondylosis without myelopathy or radiculopathy, cervical region: Secondary | ICD-10-CM

## 2021-02-04 NOTE — Patient Instructions (Signed)
____________________________________________________________________________________________  Preparing for Procedure with Sedation  Procedure appointments are limited to planned procedures: . No Prescription Refills. . No disability issues will be discussed. . No medication changes will be discussed.  Instructions: . Oral Intake: Do not eat or drink anything for at least 8 hours prior to your procedure. (Exception: Blood Pressure Medication. See below.) . Transportation: Unless otherwise stated by your physician, you may drive yourself after the procedure. . Blood Pressure Medicine: Do not forget to take your blood pressure medicine with a sip of water the morning of the procedure. If your Diastolic (lower reading)is above 100 mmHg, elective cases will be cancelled/rescheduled. . Blood thinners: These will need to be stopped for procedures. Notify our staff if you are taking any blood thinners. Depending on which one you take, there will be specific instructions on how and when to stop it. . Diabetics on insulin: Notify the staff so that you can be scheduled 1st case in the morning. If your diabetes requires high dose insulin, take only  of your normal insulin dose the morning of the procedure and notify the staff that you have done so. . Preventing infections: Shower with an antibacterial soap the morning of your procedure. . Build-up your immune system: Take 1000 mg of Vitamin C with every meal (3 times a day) the day prior to your procedure. . Antibiotics: Inform the staff if you have a condition or reason that requires you to take antibiotics before dental procedures. . Pregnancy: If you are pregnant, call and cancel the procedure. . Sickness: If you have a cold, fever, or any active infections, call and cancel the procedure. . Arrival: You must be in the facility at least 30 minutes prior to your scheduled procedure. . Children: Do not bring children with you. . Dress appropriately:  Bring dark clothing that you would not mind if they get stained. . Valuables: Do not bring any jewelry or valuables.  Reasons to call and reschedule or cancel your procedure: (Following these recommendations will minimize the risk of a serious complication.) . Surgeries: Avoid having procedures within 2 weeks of any surgery. (Avoid for 2 weeks before or after any surgery). . Flu Shots: Avoid having procedures within 2 weeks of a flu shots or . (Avoid for 2 weeks before or after immunizations). . Barium: Avoid having a procedure within 7-10 days after having had a radiological study involving the use of radiological contrast. (Myelograms, Barium swallow or enema study). . Heart attacks: Avoid any elective procedures or surgeries for the initial 6 months after a "Myocardial Infarction" (Heart Attack). . Blood thinners: It is imperative that you stop these medications before procedures. Let us know if you if you take any blood thinner.  . Infection: Avoid procedures during or within two weeks of an infection (including chest colds or gastrointestinal problems). Symptoms associated with infections include: Localized redness, fever, chills, night sweats or profuse sweating, burning sensation when voiding, cough, congestion, stuffiness, runny nose, sore throat, diarrhea, nausea, vomiting, cold or Flu symptoms, recent or current infections. It is specially important if the infection is over the area that we intend to treat. . Heart and lung problems: Symptoms that may suggest an active cardiopulmonary problem include: cough, chest pain, breathing difficulties or shortness of breath, dizziness, ankle swelling, uncontrolled high or unusually low blood pressure, and/or palpitations. If you are experiencing any of these symptoms, cancel your procedure and contact your primary care physician for an evaluation.  Remember:  Regular Business hours are:    Monday to Thursday 8:00 AM to 4:00 PM  Provider's  Schedule: Daneesha Quinteros, MD:  Procedure days: Tuesday and Thursday 7:30 AM to 4:00 PM  Bilal Lateef, MD:  Procedure days: Monday and Wednesday 7:30 AM to 4:00 PM ____________________________________________________________________________________________    

## 2021-02-04 NOTE — Telephone Encounter (Signed)
LM for patient to call office for pre virtual appointment questions.  

## 2021-02-11 DIAGNOSIS — Z9049 Acquired absence of other specified parts of digestive tract: Secondary | ICD-10-CM | POA: Diagnosis not present

## 2021-02-11 DIAGNOSIS — M069 Rheumatoid arthritis, unspecified: Secondary | ICD-10-CM | POA: Diagnosis not present

## 2021-02-11 DIAGNOSIS — R7303 Prediabetes: Secondary | ICD-10-CM | POA: Diagnosis not present

## 2021-02-11 DIAGNOSIS — F32A Depression, unspecified: Secondary | ICD-10-CM | POA: Diagnosis not present

## 2021-02-11 DIAGNOSIS — Z8673 Personal history of transient ischemic attack (TIA), and cerebral infarction without residual deficits: Secondary | ICD-10-CM | POA: Diagnosis not present

## 2021-02-11 DIAGNOSIS — Z471 Aftercare following joint replacement surgery: Secondary | ICD-10-CM | POA: Diagnosis not present

## 2021-02-11 DIAGNOSIS — Z981 Arthrodesis status: Secondary | ICD-10-CM | POA: Diagnosis not present

## 2021-02-11 DIAGNOSIS — K219 Gastro-esophageal reflux disease without esophagitis: Secondary | ICD-10-CM | POA: Diagnosis not present

## 2021-02-11 DIAGNOSIS — G4733 Obstructive sleep apnea (adult) (pediatric): Secondary | ICD-10-CM | POA: Diagnosis not present

## 2021-02-11 DIAGNOSIS — T84093A Other mechanical complication of internal left knee prosthesis, initial encounter: Secondary | ICD-10-CM | POA: Diagnosis not present

## 2021-02-11 DIAGNOSIS — Z96651 Presence of right artificial knee joint: Secondary | ICD-10-CM | POA: Diagnosis not present

## 2021-02-11 DIAGNOSIS — Z96652 Presence of left artificial knee joint: Secondary | ICD-10-CM | POA: Diagnosis not present

## 2021-02-11 DIAGNOSIS — Z833 Family history of diabetes mellitus: Secondary | ICD-10-CM | POA: Diagnosis not present

## 2021-02-11 DIAGNOSIS — T8484XA Pain due to internal orthopedic prosthetic devices, implants and grafts, initial encounter: Secondary | ICD-10-CM | POA: Diagnosis not present

## 2021-02-11 DIAGNOSIS — M1712 Unilateral primary osteoarthritis, left knee: Secondary | ICD-10-CM | POA: Diagnosis not present

## 2021-02-11 DIAGNOSIS — G8918 Other acute postprocedural pain: Secondary | ICD-10-CM | POA: Diagnosis not present

## 2021-02-11 DIAGNOSIS — Z8261 Family history of arthritis: Secondary | ICD-10-CM | POA: Diagnosis not present

## 2021-02-11 DIAGNOSIS — E785 Hyperlipidemia, unspecified: Secondary | ICD-10-CM | POA: Diagnosis not present

## 2021-02-11 DIAGNOSIS — Z8262 Family history of osteoporosis: Secondary | ICD-10-CM | POA: Diagnosis not present

## 2021-02-11 DIAGNOSIS — Z6834 Body mass index (BMI) 34.0-34.9, adult: Secondary | ICD-10-CM | POA: Diagnosis not present

## 2021-02-11 DIAGNOSIS — M1711 Unilateral primary osteoarthritis, right knee: Secondary | ICD-10-CM | POA: Diagnosis not present

## 2021-02-11 DIAGNOSIS — E669 Obesity, unspecified: Secondary | ICD-10-CM | POA: Diagnosis not present

## 2021-02-11 DIAGNOSIS — Z87891 Personal history of nicotine dependence: Secondary | ICD-10-CM | POA: Diagnosis not present

## 2021-02-11 DIAGNOSIS — M797 Fibromyalgia: Secondary | ICD-10-CM | POA: Diagnosis not present

## 2021-02-11 DIAGNOSIS — M25562 Pain in left knee: Secondary | ICD-10-CM | POA: Diagnosis not present

## 2021-02-11 DIAGNOSIS — F419 Anxiety disorder, unspecified: Secondary | ICD-10-CM | POA: Diagnosis not present

## 2021-02-17 DIAGNOSIS — R262 Difficulty in walking, not elsewhere classified: Secondary | ICD-10-CM | POA: Diagnosis not present

## 2021-02-17 DIAGNOSIS — Z96652 Presence of left artificial knee joint: Secondary | ICD-10-CM | POA: Diagnosis not present

## 2021-02-17 DIAGNOSIS — R29898 Other symptoms and signs involving the musculoskeletal system: Secondary | ICD-10-CM | POA: Diagnosis not present

## 2021-02-17 DIAGNOSIS — T8484XA Pain due to internal orthopedic prosthetic devices, implants and grafts, initial encounter: Secondary | ICD-10-CM | POA: Diagnosis not present

## 2021-02-22 DIAGNOSIS — M25569 Pain in unspecified knee: Secondary | ICD-10-CM | POA: Diagnosis not present

## 2021-02-22 DIAGNOSIS — Z299 Encounter for prophylactic measures, unspecified: Secondary | ICD-10-CM | POA: Diagnosis not present

## 2021-02-22 DIAGNOSIS — I10 Essential (primary) hypertension: Secondary | ICD-10-CM | POA: Diagnosis not present

## 2021-02-22 DIAGNOSIS — F339 Major depressive disorder, recurrent, unspecified: Secondary | ICD-10-CM | POA: Diagnosis not present

## 2021-02-25 DIAGNOSIS — Z96652 Presence of left artificial knee joint: Secondary | ICD-10-CM | POA: Diagnosis not present

## 2021-02-25 DIAGNOSIS — Z791 Long term (current) use of non-steroidal anti-inflammatories (NSAID): Secondary | ICD-10-CM | POA: Diagnosis not present

## 2021-02-25 DIAGNOSIS — Z4789 Encounter for other orthopedic aftercare: Secondary | ICD-10-CM | POA: Diagnosis not present

## 2021-03-06 DIAGNOSIS — E7800 Pure hypercholesterolemia, unspecified: Secondary | ICD-10-CM | POA: Diagnosis not present

## 2021-03-06 DIAGNOSIS — I10 Essential (primary) hypertension: Secondary | ICD-10-CM | POA: Diagnosis not present

## 2021-03-11 DIAGNOSIS — Z471 Aftercare following joint replacement surgery: Secondary | ICD-10-CM | POA: Diagnosis not present

## 2021-03-11 DIAGNOSIS — Z96652 Presence of left artificial knee joint: Secondary | ICD-10-CM | POA: Diagnosis not present

## 2021-03-11 DIAGNOSIS — M25462 Effusion, left knee: Secondary | ICD-10-CM | POA: Diagnosis not present

## 2021-03-15 DIAGNOSIS — M25662 Stiffness of left knee, not elsewhere classified: Secondary | ICD-10-CM | POA: Diagnosis not present

## 2021-03-15 DIAGNOSIS — T8484XA Pain due to internal orthopedic prosthetic devices, implants and grafts, initial encounter: Secondary | ICD-10-CM | POA: Diagnosis not present

## 2021-03-15 DIAGNOSIS — M25562 Pain in left knee: Secondary | ICD-10-CM | POA: Diagnosis not present

## 2021-03-15 DIAGNOSIS — R262 Difficulty in walking, not elsewhere classified: Secondary | ICD-10-CM | POA: Diagnosis not present

## 2021-03-24 DIAGNOSIS — H40053 Ocular hypertension, bilateral: Secondary | ICD-10-CM | POA: Diagnosis not present

## 2021-03-24 DIAGNOSIS — H11153 Pinguecula, bilateral: Secondary | ICD-10-CM | POA: Diagnosis not present

## 2021-03-24 DIAGNOSIS — H04123 Dry eye syndrome of bilateral lacrimal glands: Secondary | ICD-10-CM | POA: Diagnosis not present

## 2021-03-24 DIAGNOSIS — H16223 Keratoconjunctivitis sicca, not specified as Sjogren's, bilateral: Secondary | ICD-10-CM | POA: Diagnosis not present

## 2021-03-24 DIAGNOSIS — H2513 Age-related nuclear cataract, bilateral: Secondary | ICD-10-CM | POA: Diagnosis not present

## 2021-03-24 DIAGNOSIS — H11133 Conjunctival pigmentations, bilateral: Secondary | ICD-10-CM | POA: Diagnosis not present

## 2021-03-29 DIAGNOSIS — Z96652 Presence of left artificial knee joint: Secondary | ICD-10-CM | POA: Diagnosis not present

## 2021-03-29 DIAGNOSIS — Z471 Aftercare following joint replacement surgery: Secondary | ICD-10-CM | POA: Diagnosis not present

## 2021-04-05 DIAGNOSIS — E7800 Pure hypercholesterolemia, unspecified: Secondary | ICD-10-CM | POA: Diagnosis not present

## 2021-04-05 DIAGNOSIS — I10 Essential (primary) hypertension: Secondary | ICD-10-CM | POA: Diagnosis not present

## 2021-04-12 DIAGNOSIS — Z299 Encounter for prophylactic measures, unspecified: Secondary | ICD-10-CM | POA: Diagnosis not present

## 2021-04-12 DIAGNOSIS — I10 Essential (primary) hypertension: Secondary | ICD-10-CM | POA: Diagnosis not present

## 2021-04-12 DIAGNOSIS — R5383 Other fatigue: Secondary | ICD-10-CM | POA: Diagnosis not present

## 2021-04-12 DIAGNOSIS — F339 Major depressive disorder, recurrent, unspecified: Secondary | ICD-10-CM | POA: Diagnosis not present

## 2021-04-12 DIAGNOSIS — M5136 Other intervertebral disc degeneration, lumbar region: Secondary | ICD-10-CM | POA: Diagnosis not present

## 2021-05-06 DIAGNOSIS — E7800 Pure hypercholesterolemia, unspecified: Secondary | ICD-10-CM | POA: Diagnosis not present

## 2021-05-06 DIAGNOSIS — I10 Essential (primary) hypertension: Secondary | ICD-10-CM | POA: Diagnosis not present

## 2021-05-26 DIAGNOSIS — Z299 Encounter for prophylactic measures, unspecified: Secondary | ICD-10-CM | POA: Diagnosis not present

## 2021-05-26 DIAGNOSIS — I10 Essential (primary) hypertension: Secondary | ICD-10-CM | POA: Diagnosis not present

## 2021-05-26 DIAGNOSIS — N951 Menopausal and female climacteric states: Secondary | ICD-10-CM | POA: Diagnosis not present

## 2021-05-31 DIAGNOSIS — Z886 Allergy status to analgesic agent status: Secondary | ICD-10-CM | POA: Diagnosis not present

## 2021-05-31 DIAGNOSIS — Z471 Aftercare following joint replacement surgery: Secondary | ICD-10-CM | POA: Diagnosis not present

## 2021-05-31 DIAGNOSIS — G8929 Other chronic pain: Secondary | ICD-10-CM | POA: Diagnosis not present

## 2021-05-31 DIAGNOSIS — Z96652 Presence of left artificial knee joint: Secondary | ICD-10-CM | POA: Diagnosis not present

## 2021-05-31 DIAGNOSIS — M25562 Pain in left knee: Secondary | ICD-10-CM | POA: Diagnosis not present

## 2021-06-06 DIAGNOSIS — E7800 Pure hypercholesterolemia, unspecified: Secondary | ICD-10-CM | POA: Diagnosis not present

## 2021-06-06 DIAGNOSIS — I10 Essential (primary) hypertension: Secondary | ICD-10-CM | POA: Diagnosis not present

## 2021-06-22 DIAGNOSIS — M2560 Stiffness of unspecified joint, not elsewhere classified: Secondary | ICD-10-CM | POA: Diagnosis not present

## 2021-06-22 DIAGNOSIS — R2689 Other abnormalities of gait and mobility: Secondary | ICD-10-CM | POA: Diagnosis not present

## 2021-06-22 DIAGNOSIS — M25562 Pain in left knee: Secondary | ICD-10-CM | POA: Diagnosis not present

## 2021-06-22 DIAGNOSIS — G8929 Other chronic pain: Secondary | ICD-10-CM | POA: Diagnosis not present

## 2021-06-22 DIAGNOSIS — R29898 Other symptoms and signs involving the musculoskeletal system: Secondary | ICD-10-CM | POA: Diagnosis not present

## 2021-06-22 DIAGNOSIS — Z96652 Presence of left artificial knee joint: Secondary | ICD-10-CM | POA: Diagnosis not present

## 2021-06-22 DIAGNOSIS — R262 Difficulty in walking, not elsewhere classified: Secondary | ICD-10-CM | POA: Diagnosis not present

## 2021-06-22 DIAGNOSIS — Z789 Other specified health status: Secondary | ICD-10-CM | POA: Diagnosis not present

## 2021-07-13 DIAGNOSIS — Z96652 Presence of left artificial knee joint: Secondary | ICD-10-CM | POA: Diagnosis not present

## 2021-07-13 DIAGNOSIS — R262 Difficulty in walking, not elsewhere classified: Secondary | ICD-10-CM | POA: Diagnosis not present

## 2021-07-13 DIAGNOSIS — R5383 Other fatigue: Secondary | ICD-10-CM | POA: Diagnosis not present

## 2021-07-13 DIAGNOSIS — R531 Weakness: Secondary | ICD-10-CM | POA: Diagnosis not present

## 2021-07-13 DIAGNOSIS — Z7409 Other reduced mobility: Secondary | ICD-10-CM | POA: Diagnosis not present

## 2021-07-13 DIAGNOSIS — G8929 Other chronic pain: Secondary | ICD-10-CM | POA: Diagnosis not present

## 2021-07-13 DIAGNOSIS — R29898 Other symptoms and signs involving the musculoskeletal system: Secondary | ICD-10-CM | POA: Diagnosis not present

## 2021-07-13 DIAGNOSIS — M5440 Lumbago with sciatica, unspecified side: Secondary | ICD-10-CM | POA: Diagnosis not present

## 2021-07-13 DIAGNOSIS — M2560 Stiffness of unspecified joint, not elsewhere classified: Secondary | ICD-10-CM | POA: Diagnosis not present

## 2021-07-13 DIAGNOSIS — M25562 Pain in left knee: Secondary | ICD-10-CM | POA: Diagnosis not present

## 2021-07-16 DIAGNOSIS — F339 Major depressive disorder, recurrent, unspecified: Secondary | ICD-10-CM | POA: Diagnosis not present

## 2021-07-16 DIAGNOSIS — Z299 Encounter for prophylactic measures, unspecified: Secondary | ICD-10-CM | POA: Diagnosis not present

## 2021-07-16 DIAGNOSIS — M5136 Other intervertebral disc degeneration, lumbar region: Secondary | ICD-10-CM | POA: Diagnosis not present

## 2021-07-16 DIAGNOSIS — I10 Essential (primary) hypertension: Secondary | ICD-10-CM | POA: Diagnosis not present

## 2021-08-02 DIAGNOSIS — Z96653 Presence of artificial knee joint, bilateral: Secondary | ICD-10-CM | POA: Diagnosis not present

## 2021-08-02 DIAGNOSIS — Z471 Aftercare following joint replacement surgery: Secondary | ICD-10-CM | POA: Diagnosis not present

## 2021-08-02 DIAGNOSIS — Z96652 Presence of left artificial knee joint: Secondary | ICD-10-CM | POA: Diagnosis not present

## 2021-08-02 DIAGNOSIS — Z981 Arthrodesis status: Secondary | ICD-10-CM | POA: Diagnosis not present

## 2021-08-02 DIAGNOSIS — M25561 Pain in right knee: Secondary | ICD-10-CM | POA: Diagnosis not present

## 2021-08-02 DIAGNOSIS — Z96651 Presence of right artificial knee joint: Secondary | ICD-10-CM | POA: Diagnosis not present

## 2021-08-02 DIAGNOSIS — M25462 Effusion, left knee: Secondary | ICD-10-CM | POA: Diagnosis not present

## 2021-08-02 DIAGNOSIS — M160 Bilateral primary osteoarthritis of hip: Secondary | ICD-10-CM | POA: Diagnosis not present

## 2021-08-02 DIAGNOSIS — M461 Sacroiliitis, not elsewhere classified: Secondary | ICD-10-CM | POA: Diagnosis not present

## 2021-08-02 DIAGNOSIS — Z96641 Presence of right artificial hip joint: Secondary | ICD-10-CM | POA: Diagnosis not present

## 2021-08-02 DIAGNOSIS — M1611 Unilateral primary osteoarthritis, right hip: Secondary | ICD-10-CM | POA: Diagnosis not present

## 2021-08-02 DIAGNOSIS — M25661 Stiffness of right knee, not elsewhere classified: Secondary | ICD-10-CM | POA: Diagnosis not present

## 2021-08-02 DIAGNOSIS — M25562 Pain in left knee: Secondary | ICD-10-CM | POA: Diagnosis not present

## 2021-08-02 DIAGNOSIS — M25662 Stiffness of left knee, not elsewhere classified: Secondary | ICD-10-CM | POA: Diagnosis not present

## 2021-08-02 DIAGNOSIS — M25461 Effusion, right knee: Secondary | ICD-10-CM | POA: Diagnosis not present

## 2021-08-11 DIAGNOSIS — M1611 Unilateral primary osteoarthritis, right hip: Secondary | ICD-10-CM | POA: Diagnosis not present

## 2021-08-11 DIAGNOSIS — Z96651 Presence of right artificial knee joint: Secondary | ICD-10-CM | POA: Diagnosis not present

## 2021-08-13 DIAGNOSIS — G8929 Other chronic pain: Secondary | ICD-10-CM | POA: Diagnosis not present

## 2021-08-13 DIAGNOSIS — T8484XA Pain due to internal orthopedic prosthetic devices, implants and grafts, initial encounter: Secondary | ICD-10-CM | POA: Diagnosis not present

## 2021-08-13 DIAGNOSIS — R262 Difficulty in walking, not elsewhere classified: Secondary | ICD-10-CM | POA: Diagnosis not present

## 2021-08-13 DIAGNOSIS — M5440 Lumbago with sciatica, unspecified side: Secondary | ICD-10-CM | POA: Diagnosis not present

## 2021-08-13 DIAGNOSIS — M25562 Pain in left knee: Secondary | ICD-10-CM | POA: Diagnosis not present

## 2021-08-13 DIAGNOSIS — Z96652 Presence of left artificial knee joint: Secondary | ICD-10-CM | POA: Diagnosis not present

## 2021-08-18 DIAGNOSIS — Z299 Encounter for prophylactic measures, unspecified: Secondary | ICD-10-CM | POA: Diagnosis not present

## 2021-08-18 DIAGNOSIS — Z7189 Other specified counseling: Secondary | ICD-10-CM | POA: Diagnosis not present

## 2021-08-18 DIAGNOSIS — F339 Major depressive disorder, recurrent, unspecified: Secondary | ICD-10-CM | POA: Diagnosis not present

## 2021-08-18 DIAGNOSIS — Z1339 Encounter for screening examination for other mental health and behavioral disorders: Secondary | ICD-10-CM | POA: Diagnosis not present

## 2021-08-18 DIAGNOSIS — Z0000 Encounter for general adult medical examination without abnormal findings: Secondary | ICD-10-CM | POA: Diagnosis not present

## 2021-08-18 DIAGNOSIS — R5383 Other fatigue: Secondary | ICD-10-CM | POA: Diagnosis not present

## 2021-08-18 DIAGNOSIS — Z87891 Personal history of nicotine dependence: Secondary | ICD-10-CM | POA: Diagnosis not present

## 2021-08-18 DIAGNOSIS — Z23 Encounter for immunization: Secondary | ICD-10-CM | POA: Diagnosis not present

## 2021-08-18 DIAGNOSIS — Z1331 Encounter for screening for depression: Secondary | ICD-10-CM | POA: Diagnosis not present

## 2021-08-18 DIAGNOSIS — E7800 Pure hypercholesterolemia, unspecified: Secondary | ICD-10-CM | POA: Diagnosis not present

## 2021-08-18 DIAGNOSIS — I10 Essential (primary) hypertension: Secondary | ICD-10-CM | POA: Diagnosis not present

## 2021-08-18 DIAGNOSIS — Z6832 Body mass index (BMI) 32.0-32.9, adult: Secondary | ICD-10-CM | POA: Diagnosis not present

## 2021-08-26 DIAGNOSIS — H40023 Open angle with borderline findings, high risk, bilateral: Secondary | ICD-10-CM | POA: Diagnosis not present

## 2021-09-10 ENCOUNTER — Institutional Professional Consult (permissible substitution): Payer: PPO | Admitting: Pulmonary Disease

## 2021-10-06 DIAGNOSIS — I10 Essential (primary) hypertension: Secondary | ICD-10-CM | POA: Diagnosis not present

## 2021-10-06 DIAGNOSIS — E7800 Pure hypercholesterolemia, unspecified: Secondary | ICD-10-CM | POA: Diagnosis not present

## 2021-10-14 DIAGNOSIS — Z6832 Body mass index (BMI) 32.0-32.9, adult: Secondary | ICD-10-CM | POA: Diagnosis not present

## 2021-10-14 DIAGNOSIS — U071 COVID-19: Secondary | ICD-10-CM | POA: Diagnosis not present

## 2021-10-26 ENCOUNTER — Institutional Professional Consult (permissible substitution): Payer: PPO | Admitting: Pulmonary Disease

## 2021-11-05 DIAGNOSIS — E7800 Pure hypercholesterolemia, unspecified: Secondary | ICD-10-CM | POA: Diagnosis not present

## 2021-11-24 DIAGNOSIS — Z6834 Body mass index (BMI) 34.0-34.9, adult: Secondary | ICD-10-CM | POA: Diagnosis not present

## 2021-11-24 DIAGNOSIS — K219 Gastro-esophageal reflux disease without esophagitis: Secondary | ICD-10-CM | POA: Diagnosis not present

## 2021-11-24 DIAGNOSIS — E6609 Other obesity due to excess calories: Secondary | ICD-10-CM | POA: Diagnosis not present

## 2021-11-24 DIAGNOSIS — Z299 Encounter for prophylactic measures, unspecified: Secondary | ICD-10-CM | POA: Diagnosis not present

## 2021-11-24 DIAGNOSIS — I10 Essential (primary) hypertension: Secondary | ICD-10-CM | POA: Diagnosis not present

## 2021-11-24 DIAGNOSIS — F339 Major depressive disorder, recurrent, unspecified: Secondary | ICD-10-CM | POA: Diagnosis not present

## 2021-12-06 DIAGNOSIS — Z4789 Encounter for other orthopedic aftercare: Secondary | ICD-10-CM | POA: Diagnosis not present

## 2021-12-06 DIAGNOSIS — M1611 Unilateral primary osteoarthritis, right hip: Secondary | ICD-10-CM | POA: Diagnosis not present

## 2021-12-20 DIAGNOSIS — Z299 Encounter for prophylactic measures, unspecified: Secondary | ICD-10-CM | POA: Diagnosis not present

## 2021-12-20 DIAGNOSIS — M19079 Primary osteoarthritis, unspecified ankle and foot: Secondary | ICD-10-CM | POA: Diagnosis not present

## 2021-12-20 DIAGNOSIS — M545 Low back pain, unspecified: Secondary | ICD-10-CM | POA: Diagnosis not present

## 2021-12-20 DIAGNOSIS — W19XXXA Unspecified fall, initial encounter: Secondary | ICD-10-CM | POA: Diagnosis not present

## 2021-12-20 DIAGNOSIS — I10 Essential (primary) hypertension: Secondary | ICD-10-CM | POA: Diagnosis not present

## 2022-01-14 DIAGNOSIS — F339 Major depressive disorder, recurrent, unspecified: Secondary | ICD-10-CM | POA: Diagnosis not present

## 2022-01-14 DIAGNOSIS — R413 Other amnesia: Secondary | ICD-10-CM | POA: Diagnosis not present

## 2022-01-14 DIAGNOSIS — G4730 Sleep apnea, unspecified: Secondary | ICD-10-CM | POA: Diagnosis not present

## 2022-01-14 DIAGNOSIS — I10 Essential (primary) hypertension: Secondary | ICD-10-CM | POA: Diagnosis not present

## 2022-01-14 DIAGNOSIS — Z299 Encounter for prophylactic measures, unspecified: Secondary | ICD-10-CM | POA: Diagnosis not present

## 2022-01-20 DIAGNOSIS — R7301 Impaired fasting glucose: Secondary | ICD-10-CM | POA: Diagnosis not present

## 2022-01-20 DIAGNOSIS — R7989 Other specified abnormal findings of blood chemistry: Secondary | ICD-10-CM | POA: Diagnosis not present

## 2022-01-20 DIAGNOSIS — M1611 Unilateral primary osteoarthritis, right hip: Secondary | ICD-10-CM | POA: Diagnosis not present

## 2022-01-25 DIAGNOSIS — Z96641 Presence of right artificial hip joint: Secondary | ICD-10-CM | POA: Diagnosis not present

## 2022-01-25 DIAGNOSIS — F419 Anxiety disorder, unspecified: Secondary | ICD-10-CM | POA: Diagnosis not present

## 2022-01-25 DIAGNOSIS — R7303 Prediabetes: Secondary | ICD-10-CM | POA: Diagnosis not present

## 2022-01-25 DIAGNOSIS — Z79899 Other long term (current) drug therapy: Secondary | ICD-10-CM | POA: Diagnosis not present

## 2022-01-25 DIAGNOSIS — E119 Type 2 diabetes mellitus without complications: Secondary | ICD-10-CM | POA: Diagnosis not present

## 2022-01-25 DIAGNOSIS — M25551 Pain in right hip: Secondary | ICD-10-CM | POA: Diagnosis not present

## 2022-01-25 DIAGNOSIS — G8929 Other chronic pain: Secondary | ICD-10-CM | POA: Diagnosis not present

## 2022-01-25 DIAGNOSIS — M1611 Unilateral primary osteoarthritis, right hip: Secondary | ICD-10-CM | POA: Diagnosis not present

## 2022-01-25 DIAGNOSIS — M1990 Unspecified osteoarthritis, unspecified site: Secondary | ICD-10-CM | POA: Diagnosis not present

## 2022-01-25 DIAGNOSIS — E785 Hyperlipidemia, unspecified: Secondary | ICD-10-CM | POA: Diagnosis not present

## 2022-01-25 DIAGNOSIS — G4733 Obstructive sleep apnea (adult) (pediatric): Secondary | ICD-10-CM | POA: Diagnosis not present

## 2022-01-25 DIAGNOSIS — F32A Depression, unspecified: Secondary | ICD-10-CM | POA: Diagnosis not present

## 2022-01-25 DIAGNOSIS — G8918 Other acute postprocedural pain: Secondary | ICD-10-CM | POA: Diagnosis not present

## 2022-01-25 DIAGNOSIS — F418 Other specified anxiety disorders: Secondary | ICD-10-CM | POA: Diagnosis not present

## 2022-01-25 DIAGNOSIS — Z471 Aftercare following joint replacement surgery: Secondary | ICD-10-CM | POA: Diagnosis not present

## 2022-01-25 DIAGNOSIS — M797 Fibromyalgia: Secondary | ICD-10-CM | POA: Diagnosis not present

## 2022-01-25 DIAGNOSIS — K219 Gastro-esophageal reflux disease without esophagitis: Secondary | ICD-10-CM | POA: Diagnosis not present

## 2022-01-26 DIAGNOSIS — G8918 Other acute postprocedural pain: Secondary | ICD-10-CM | POA: Diagnosis not present

## 2022-01-26 DIAGNOSIS — Z96641 Presence of right artificial hip joint: Secondary | ICD-10-CM | POA: Diagnosis not present

## 2022-01-26 DIAGNOSIS — F419 Anxiety disorder, unspecified: Secondary | ICD-10-CM | POA: Diagnosis not present

## 2022-01-26 DIAGNOSIS — K219 Gastro-esophageal reflux disease without esophagitis: Secondary | ICD-10-CM | POA: Diagnosis not present

## 2022-01-26 DIAGNOSIS — F32A Depression, unspecified: Secondary | ICD-10-CM | POA: Diagnosis not present

## 2022-01-26 DIAGNOSIS — G8929 Other chronic pain: Secondary | ICD-10-CM | POA: Diagnosis not present

## 2022-01-26 DIAGNOSIS — M25551 Pain in right hip: Secondary | ICD-10-CM | POA: Diagnosis not present

## 2022-01-26 DIAGNOSIS — M1611 Unilateral primary osteoarthritis, right hip: Secondary | ICD-10-CM | POA: Diagnosis not present

## 2022-01-26 DIAGNOSIS — E785 Hyperlipidemia, unspecified: Secondary | ICD-10-CM | POA: Diagnosis not present

## 2022-01-26 DIAGNOSIS — E119 Type 2 diabetes mellitus without complications: Secondary | ICD-10-CM | POA: Diagnosis not present

## 2022-01-26 DIAGNOSIS — F418 Other specified anxiety disorders: Secondary | ICD-10-CM | POA: Diagnosis not present

## 2022-01-26 DIAGNOSIS — Z79899 Other long term (current) drug therapy: Secondary | ICD-10-CM | POA: Diagnosis not present

## 2022-01-26 DIAGNOSIS — G4733 Obstructive sleep apnea (adult) (pediatric): Secondary | ICD-10-CM | POA: Diagnosis not present

## 2022-02-08 DIAGNOSIS — Z471 Aftercare following joint replacement surgery: Secondary | ICD-10-CM | POA: Diagnosis not present

## 2022-02-08 DIAGNOSIS — Z96641 Presence of right artificial hip joint: Secondary | ICD-10-CM | POA: Diagnosis not present

## 2022-02-21 DIAGNOSIS — H524 Presbyopia: Secondary | ICD-10-CM | POA: Diagnosis not present

## 2022-02-21 DIAGNOSIS — H40021 Open angle with borderline findings, high risk, right eye: Secondary | ICD-10-CM | POA: Diagnosis not present

## 2022-03-08 DIAGNOSIS — Z886 Allergy status to analgesic agent status: Secondary | ICD-10-CM | POA: Diagnosis not present

## 2022-03-08 DIAGNOSIS — Z471 Aftercare following joint replacement surgery: Secondary | ICD-10-CM | POA: Diagnosis not present

## 2022-03-08 DIAGNOSIS — Z981 Arthrodesis status: Secondary | ICD-10-CM | POA: Diagnosis not present

## 2022-03-08 DIAGNOSIS — Z96641 Presence of right artificial hip joint: Secondary | ICD-10-CM | POA: Diagnosis not present

## 2022-03-08 DIAGNOSIS — M1612 Unilateral primary osteoarthritis, left hip: Secondary | ICD-10-CM | POA: Diagnosis not present

## 2022-03-08 DIAGNOSIS — M461 Sacroiliitis, not elsewhere classified: Secondary | ICD-10-CM | POA: Diagnosis not present

## 2022-03-15 DIAGNOSIS — I10 Essential (primary) hypertension: Secondary | ICD-10-CM | POA: Diagnosis not present

## 2022-03-15 DIAGNOSIS — Z6833 Body mass index (BMI) 33.0-33.9, adult: Secondary | ICD-10-CM | POA: Diagnosis not present

## 2022-03-15 DIAGNOSIS — Z299 Encounter for prophylactic measures, unspecified: Secondary | ICD-10-CM | POA: Diagnosis not present

## 2022-03-15 DIAGNOSIS — Z713 Dietary counseling and surveillance: Secondary | ICD-10-CM | POA: Diagnosis not present

## 2022-04-28 ENCOUNTER — Other Ambulatory Visit: Payer: Self-pay | Admitting: Internal Medicine

## 2022-04-28 DIAGNOSIS — Z1231 Encounter for screening mammogram for malignant neoplasm of breast: Secondary | ICD-10-CM

## 2022-05-06 DIAGNOSIS — Z299 Encounter for prophylactic measures, unspecified: Secondary | ICD-10-CM | POA: Diagnosis not present

## 2022-05-06 DIAGNOSIS — B351 Tinea unguium: Secondary | ICD-10-CM | POA: Diagnosis not present

## 2022-05-06 DIAGNOSIS — B353 Tinea pedis: Secondary | ICD-10-CM | POA: Diagnosis not present

## 2022-05-06 DIAGNOSIS — I10 Essential (primary) hypertension: Secondary | ICD-10-CM | POA: Diagnosis not present

## 2022-05-18 ENCOUNTER — Ambulatory Visit
Admission: RE | Admit: 2022-05-18 | Discharge: 2022-05-18 | Disposition: A | Discharge status: Home / Self Care | Payer: Medicare HMO | Source: Ambulatory Visit | Attending: Internal Medicine | Admitting: Internal Medicine

## 2022-05-18 DIAGNOSIS — M533 Sacrococcygeal disorders, not elsewhere classified: Secondary | ICD-10-CM | POA: Diagnosis not present

## 2022-05-18 DIAGNOSIS — Z1231 Encounter for screening mammogram for malignant neoplasm of breast: Secondary | ICD-10-CM

## 2022-05-18 DIAGNOSIS — Z96652 Presence of left artificial knee joint: Secondary | ICD-10-CM | POA: Diagnosis not present

## 2022-05-18 DIAGNOSIS — Z471 Aftercare following joint replacement surgery: Secondary | ICD-10-CM | POA: Diagnosis not present

## 2022-05-18 DIAGNOSIS — M1612 Unilateral primary osteoarthritis, left hip: Secondary | ICD-10-CM | POA: Diagnosis not present

## 2022-05-18 DIAGNOSIS — M25462 Effusion, left knee: Secondary | ICD-10-CM | POA: Diagnosis not present

## 2022-05-18 DIAGNOSIS — Z96641 Presence of right artificial hip joint: Secondary | ICD-10-CM | POA: Diagnosis not present

## 2022-06-06 DIAGNOSIS — I10 Essential (primary) hypertension: Secondary | ICD-10-CM | POA: Diagnosis not present

## 2022-06-06 DIAGNOSIS — E7800 Pure hypercholesterolemia, unspecified: Secondary | ICD-10-CM | POA: Diagnosis not present

## 2022-06-07 DIAGNOSIS — M79676 Pain in unspecified toe(s): Secondary | ICD-10-CM | POA: Diagnosis not present

## 2022-06-07 DIAGNOSIS — B351 Tinea unguium: Secondary | ICD-10-CM | POA: Diagnosis not present

## 2022-06-08 DIAGNOSIS — B351 Tinea unguium: Secondary | ICD-10-CM | POA: Diagnosis not present

## 2022-07-15 ENCOUNTER — Telehealth: Payer: Self-pay | Admitting: *Deleted

## 2022-07-15 NOTE — Patient Outreach (Signed)
  Care Coordination   Initial Visit Note   07/15/2022 Name: ELEXA KIVI MRN: [id number] DOB: 1961/04/20  Fritzi Mandes Drozdowski is a 61 y.o. year old female who sees Vyas, Angelina Pih, MD for primary care. I spoke with  Evorn Gong by phone today.  What matters to the patients health and wellness today?  Report she is interested but busy, visit scheduled for next week.     SDOH assessments and interventions completed:  No     Care Coordination Interventions Activated:  No  Care Coordination Interventions:  No, not indicated   Follow up plan: Follow up call scheduled for 9/13    Encounter Outcome:  Pt. Request to Call Back   Kemper Durie, RN, MSN, [facility] Care Management Care Management Coordinator [phone]

## 2022-07-20 ENCOUNTER — Ambulatory Visit: Payer: Self-pay | Admitting: *Deleted

## 2022-07-20 NOTE — Patient Outreach (Signed)
  Care Coordination   07/20/2022 Name: Jennifer Harmon MRN: [id number] DOB: 09/10/1961   Care Coordination Outreach Attempts:  An unsuccessful telephone outreach was attempted for a scheduled appointment today. 2nd attempt at engagement.   Follow Up Plan:  Additional outreach attempts will be made to offer the patient care coordination information and services.   Encounter Outcome:  No Answer  Care Coordination Interventions Activated:  No   Care Coordination Interventions:  No, not indicated    Kemper Durie, RN, MSN, [facility] Care Management Care Management Coordinator [phone]

## 2022-08-08 ENCOUNTER — Ambulatory Visit: Payer: Self-pay | Admitting: *Deleted

## 2022-08-08 NOTE — Patient Outreach (Signed)
  Care Coordination   08/08/2022 Name: Jennifer Harmon MRN: [id number] DOB: [birth year]   Care Coordination Outreach Attempts:  An unsuccessful telephone outreach was attempted for a scheduled appointment today.(3rd unsuccessful outreach)  Follow Up Plan:  No further outreach attempts will be made at this time. We have been unable to contact the patient to offer or enroll patient in care coordination services  Encounter Outcome:  No Answer Left HIPAA compliant voicemail to return call to [phone] if she would like more information on Care Coordination Services and to schedule an appointment Care Coordination Interventions Activated:  No   Care Coordination Interventions:  No, not indicated    Chong Sicilian, BSN, RN-BC RN Care Coordinator [place]: [phone] Main #: [phone]

## 2022-09-15 DIAGNOSIS — Z1331 Encounter for screening for depression: Secondary | ICD-10-CM | POA: Diagnosis not present

## 2022-09-15 DIAGNOSIS — F419 Anxiety disorder, unspecified: Secondary | ICD-10-CM | POA: Diagnosis not present

## 2022-09-15 DIAGNOSIS — I10 Essential (primary) hypertension: Secondary | ICD-10-CM | POA: Diagnosis not present

## 2022-09-15 DIAGNOSIS — Z6834 Body mass index (BMI) 34.0-34.9, adult: Secondary | ICD-10-CM | POA: Diagnosis not present

## 2022-09-15 DIAGNOSIS — Z7189 Other specified counseling: Secondary | ICD-10-CM | POA: Diagnosis not present

## 2022-09-15 DIAGNOSIS — E6609 Other obesity due to excess calories: Secondary | ICD-10-CM | POA: Diagnosis not present

## 2022-09-15 DIAGNOSIS — Z23 Encounter for immunization: Secondary | ICD-10-CM | POA: Diagnosis not present

## 2022-09-15 DIAGNOSIS — Z87891 Personal history of nicotine dependence: Secondary | ICD-10-CM | POA: Diagnosis not present

## 2022-09-15 DIAGNOSIS — R5383 Other fatigue: Secondary | ICD-10-CM | POA: Diagnosis not present

## 2022-09-15 DIAGNOSIS — E7800 Pure hypercholesterolemia, unspecified: Secondary | ICD-10-CM | POA: Diagnosis not present

## 2022-09-15 DIAGNOSIS — Z1339 Encounter for screening examination for other mental health and behavioral disorders: Secondary | ICD-10-CM | POA: Diagnosis not present

## 2022-09-15 DIAGNOSIS — Z1211 Encounter for screening for malignant neoplasm of colon: Secondary | ICD-10-CM | POA: Diagnosis not present

## 2022-09-15 DIAGNOSIS — Z0000 Encounter for general adult medical examination without abnormal findings: Secondary | ICD-10-CM | POA: Diagnosis not present

## 2022-09-15 DIAGNOSIS — Z79899 Other long term (current) drug therapy: Secondary | ICD-10-CM | POA: Diagnosis not present

## 2022-11-16 DIAGNOSIS — G4730 Sleep apnea, unspecified: Secondary | ICD-10-CM | POA: Diagnosis not present

## 2022-11-16 DIAGNOSIS — I10 Essential (primary) hypertension: Secondary | ICD-10-CM | POA: Diagnosis not present

## 2022-11-16 DIAGNOSIS — B079 Viral wart, unspecified: Secondary | ICD-10-CM | POA: Diagnosis not present

## 2022-11-16 DIAGNOSIS — Z299 Encounter for prophylactic measures, unspecified: Secondary | ICD-10-CM | POA: Diagnosis not present

## 2022-11-16 DIAGNOSIS — F339 Major depressive disorder, recurrent, unspecified: Secondary | ICD-10-CM | POA: Diagnosis not present

## 2023-02-22 DIAGNOSIS — Z96653 Presence of artificial knee joint, bilateral: Secondary | ICD-10-CM | POA: Diagnosis not present

## 2023-02-22 DIAGNOSIS — Z96641 Presence of right artificial hip joint: Secondary | ICD-10-CM | POA: Diagnosis not present

## 2023-02-22 DIAGNOSIS — Z471 Aftercare following joint replacement surgery: Secondary | ICD-10-CM | POA: Diagnosis not present

## 2023-02-22 DIAGNOSIS — Z96652 Presence of left artificial knee joint: Secondary | ICD-10-CM | POA: Diagnosis not present

## 2023-02-22 DIAGNOSIS — M1612 Unilateral primary osteoarthritis, left hip: Secondary | ICD-10-CM | POA: Diagnosis not present

## 2023-02-22 DIAGNOSIS — Z96651 Presence of right artificial knee joint: Secondary | ICD-10-CM | POA: Diagnosis not present

## 2023-02-27 DIAGNOSIS — R7309 Other abnormal glucose: Secondary | ICD-10-CM | POA: Diagnosis not present

## 2023-02-27 DIAGNOSIS — I10 Essential (primary) hypertension: Secondary | ICD-10-CM | POA: Diagnosis not present

## 2023-02-27 DIAGNOSIS — Z299 Encounter for prophylactic measures, unspecified: Secondary | ICD-10-CM | POA: Diagnosis not present

## 2023-03-01 ENCOUNTER — Encounter (INDEPENDENT_AMBULATORY_CARE_PROVIDER_SITE_OTHER): Payer: Self-pay | Admitting: *Deleted

## 2023-03-03 DIAGNOSIS — H524 Presbyopia: Secondary | ICD-10-CM | POA: Diagnosis not present

## 2023-03-03 DIAGNOSIS — Z0100 Encounter for examination of eyes and vision without abnormal findings: Secondary | ICD-10-CM | POA: Diagnosis not present

## 2023-03-03 DIAGNOSIS — H40053 Ocular hypertension, bilateral: Secondary | ICD-10-CM | POA: Diagnosis not present

## 2023-05-19 DIAGNOSIS — E669 Obesity, unspecified: Secondary | ICD-10-CM | POA: Diagnosis not present

## 2023-05-19 DIAGNOSIS — Z6836 Body mass index (BMI) 36.0-36.9, adult: Secondary | ICD-10-CM | POA: Diagnosis not present

## 2023-05-19 DIAGNOSIS — G4733 Obstructive sleep apnea (adult) (pediatric): Secondary | ICD-10-CM | POA: Diagnosis not present

## 2023-05-19 DIAGNOSIS — M1612 Unilateral primary osteoarthritis, left hip: Secondary | ICD-10-CM | POA: Diagnosis not present

## 2023-05-19 DIAGNOSIS — E7849 Other hyperlipidemia: Secondary | ICD-10-CM | POA: Diagnosis not present

## 2023-06-02 DIAGNOSIS — Z1339 Encounter for screening examination for other mental health and behavioral disorders: Secondary | ICD-10-CM | POA: Diagnosis not present

## 2023-06-02 DIAGNOSIS — Z7189 Other specified counseling: Secondary | ICD-10-CM | POA: Diagnosis not present

## 2023-06-02 DIAGNOSIS — Z1331 Encounter for screening for depression: Secondary | ICD-10-CM | POA: Diagnosis not present

## 2023-06-02 DIAGNOSIS — Z0000 Encounter for general adult medical examination without abnormal findings: Secondary | ICD-10-CM | POA: Diagnosis not present

## 2023-06-02 DIAGNOSIS — E6609 Other obesity due to excess calories: Secondary | ICD-10-CM | POA: Diagnosis not present

## 2023-06-02 DIAGNOSIS — Z6834 Body mass index (BMI) 34.0-34.9, adult: Secondary | ICD-10-CM | POA: Diagnosis not present

## 2023-06-02 DIAGNOSIS — Z87891 Personal history of nicotine dependence: Secondary | ICD-10-CM | POA: Diagnosis not present

## 2023-06-02 DIAGNOSIS — I10 Essential (primary) hypertension: Secondary | ICD-10-CM | POA: Diagnosis not present

## 2023-06-02 DIAGNOSIS — Z299 Encounter for prophylactic measures, unspecified: Secondary | ICD-10-CM | POA: Diagnosis not present

## 2023-06-08 DIAGNOSIS — M797 Fibromyalgia: Secondary | ICD-10-CM | POA: Diagnosis not present

## 2023-06-08 DIAGNOSIS — K219 Gastro-esophageal reflux disease without esophagitis: Secondary | ICD-10-CM | POA: Diagnosis not present

## 2023-06-08 DIAGNOSIS — M1612 Unilateral primary osteoarthritis, left hip: Secondary | ICD-10-CM | POA: Diagnosis not present

## 2023-06-08 DIAGNOSIS — F419 Anxiety disorder, unspecified: Secondary | ICD-10-CM | POA: Diagnosis not present

## 2023-06-08 DIAGNOSIS — E785 Hyperlipidemia, unspecified: Secondary | ICD-10-CM | POA: Diagnosis not present

## 2023-06-08 DIAGNOSIS — E669 Obesity, unspecified: Secondary | ICD-10-CM | POA: Diagnosis not present

## 2023-06-08 DIAGNOSIS — M25552 Pain in left hip: Secondary | ICD-10-CM | POA: Diagnosis not present

## 2023-06-08 DIAGNOSIS — Z96641 Presence of right artificial hip joint: Secondary | ICD-10-CM | POA: Diagnosis not present

## 2023-06-08 DIAGNOSIS — Z471 Aftercare following joint replacement surgery: Secondary | ICD-10-CM | POA: Diagnosis not present

## 2023-06-08 DIAGNOSIS — M069 Rheumatoid arthritis, unspecified: Secondary | ICD-10-CM | POA: Diagnosis not present

## 2023-06-08 DIAGNOSIS — G8929 Other chronic pain: Secondary | ICD-10-CM | POA: Diagnosis not present

## 2023-06-08 DIAGNOSIS — G4733 Obstructive sleep apnea (adult) (pediatric): Secondary | ICD-10-CM | POA: Diagnosis not present

## 2023-06-08 DIAGNOSIS — G8918 Other acute postprocedural pain: Secondary | ICD-10-CM | POA: Diagnosis not present

## 2023-06-09 DIAGNOSIS — E669 Obesity, unspecified: Secondary | ICD-10-CM | POA: Diagnosis not present

## 2023-06-09 DIAGNOSIS — G8929 Other chronic pain: Secondary | ICD-10-CM | POA: Diagnosis not present

## 2023-06-09 DIAGNOSIS — F419 Anxiety disorder, unspecified: Secondary | ICD-10-CM | POA: Diagnosis not present

## 2023-06-09 DIAGNOSIS — G4733 Obstructive sleep apnea (adult) (pediatric): Secondary | ICD-10-CM | POA: Diagnosis not present

## 2023-06-09 DIAGNOSIS — M1612 Unilateral primary osteoarthritis, left hip: Secondary | ICD-10-CM | POA: Diagnosis not present

## 2023-06-09 DIAGNOSIS — M797 Fibromyalgia: Secondary | ICD-10-CM | POA: Diagnosis not present

## 2023-06-09 DIAGNOSIS — E785 Hyperlipidemia, unspecified: Secondary | ICD-10-CM | POA: Diagnosis not present

## 2023-06-09 DIAGNOSIS — K219 Gastro-esophageal reflux disease without esophagitis: Secondary | ICD-10-CM | POA: Diagnosis not present

## 2023-06-09 DIAGNOSIS — M25552 Pain in left hip: Secondary | ICD-10-CM | POA: Diagnosis not present

## 2023-06-09 DIAGNOSIS — Z4789 Encounter for other orthopedic aftercare: Secondary | ICD-10-CM | POA: Diagnosis not present

## 2023-06-09 DIAGNOSIS — M069 Rheumatoid arthritis, unspecified: Secondary | ICD-10-CM | POA: Diagnosis not present

## 2023-06-21 DIAGNOSIS — R531 Weakness: Secondary | ICD-10-CM | POA: Diagnosis not present

## 2023-06-21 DIAGNOSIS — R52 Pain, unspecified: Secondary | ICD-10-CM | POA: Diagnosis not present

## 2023-06-21 DIAGNOSIS — R2689 Other abnormalities of gait and mobility: Secondary | ICD-10-CM | POA: Diagnosis not present

## 2023-06-21 DIAGNOSIS — R262 Difficulty in walking, not elsewhere classified: Secondary | ICD-10-CM | POA: Diagnosis not present

## 2023-06-21 DIAGNOSIS — Z789 Other specified health status: Secondary | ICD-10-CM | POA: Diagnosis not present

## 2023-06-21 DIAGNOSIS — Z96642 Presence of left artificial hip joint: Secondary | ICD-10-CM | POA: Diagnosis not present

## 2023-06-23 DIAGNOSIS — Z471 Aftercare following joint replacement surgery: Secondary | ICD-10-CM | POA: Diagnosis not present

## 2023-06-23 DIAGNOSIS — Z96642 Presence of left artificial hip joint: Secondary | ICD-10-CM | POA: Diagnosis not present

## 2023-06-26 DIAGNOSIS — R52 Pain, unspecified: Secondary | ICD-10-CM | POA: Diagnosis not present

## 2023-06-26 DIAGNOSIS — Z96642 Presence of left artificial hip joint: Secondary | ICD-10-CM | POA: Diagnosis not present

## 2023-06-26 DIAGNOSIS — R262 Difficulty in walking, not elsewhere classified: Secondary | ICD-10-CM | POA: Diagnosis not present

## 2023-06-26 DIAGNOSIS — R2689 Other abnormalities of gait and mobility: Secondary | ICD-10-CM | POA: Diagnosis not present

## 2023-06-26 DIAGNOSIS — Z789 Other specified health status: Secondary | ICD-10-CM | POA: Diagnosis not present

## 2023-06-26 DIAGNOSIS — R531 Weakness: Secondary | ICD-10-CM | POA: Diagnosis not present

## 2023-06-28 DIAGNOSIS — Z96642 Presence of left artificial hip joint: Secondary | ICD-10-CM | POA: Diagnosis not present

## 2023-06-28 DIAGNOSIS — R531 Weakness: Secondary | ICD-10-CM | POA: Diagnosis not present

## 2023-06-28 DIAGNOSIS — R2689 Other abnormalities of gait and mobility: Secondary | ICD-10-CM | POA: Diagnosis not present

## 2023-06-28 DIAGNOSIS — R262 Difficulty in walking, not elsewhere classified: Secondary | ICD-10-CM | POA: Diagnosis not present

## 2023-06-28 DIAGNOSIS — Z789 Other specified health status: Secondary | ICD-10-CM | POA: Diagnosis not present

## 2023-06-28 DIAGNOSIS — R52 Pain, unspecified: Secondary | ICD-10-CM | POA: Diagnosis not present

## 2023-07-03 DIAGNOSIS — R2689 Other abnormalities of gait and mobility: Secondary | ICD-10-CM | POA: Diagnosis not present

## 2023-07-03 DIAGNOSIS — R52 Pain, unspecified: Secondary | ICD-10-CM | POA: Diagnosis not present

## 2023-07-03 DIAGNOSIS — R531 Weakness: Secondary | ICD-10-CM | POA: Diagnosis not present

## 2023-07-03 DIAGNOSIS — Z96642 Presence of left artificial hip joint: Secondary | ICD-10-CM | POA: Diagnosis not present

## 2023-07-03 DIAGNOSIS — R262 Difficulty in walking, not elsewhere classified: Secondary | ICD-10-CM | POA: Diagnosis not present

## 2023-07-03 DIAGNOSIS — Z789 Other specified health status: Secondary | ICD-10-CM | POA: Diagnosis not present

## 2023-07-05 DIAGNOSIS — R531 Weakness: Secondary | ICD-10-CM | POA: Diagnosis not present

## 2023-07-05 DIAGNOSIS — Z96642 Presence of left artificial hip joint: Secondary | ICD-10-CM | POA: Diagnosis not present

## 2023-07-05 DIAGNOSIS — R262 Difficulty in walking, not elsewhere classified: Secondary | ICD-10-CM | POA: Diagnosis not present

## 2023-07-05 DIAGNOSIS — R2689 Other abnormalities of gait and mobility: Secondary | ICD-10-CM | POA: Diagnosis not present

## 2023-07-05 DIAGNOSIS — R52 Pain, unspecified: Secondary | ICD-10-CM | POA: Diagnosis not present

## 2023-07-05 DIAGNOSIS — Z789 Other specified health status: Secondary | ICD-10-CM | POA: Diagnosis not present

## 2023-07-11 DIAGNOSIS — Z789 Other specified health status: Secondary | ICD-10-CM | POA: Diagnosis not present

## 2023-07-11 DIAGNOSIS — R531 Weakness: Secondary | ICD-10-CM | POA: Diagnosis not present

## 2023-07-11 DIAGNOSIS — Z96642 Presence of left artificial hip joint: Secondary | ICD-10-CM | POA: Diagnosis not present

## 2023-07-11 DIAGNOSIS — R2689 Other abnormalities of gait and mobility: Secondary | ICD-10-CM | POA: Diagnosis not present

## 2023-07-11 DIAGNOSIS — R52 Pain, unspecified: Secondary | ICD-10-CM | POA: Diagnosis not present

## 2023-07-11 DIAGNOSIS — R262 Difficulty in walking, not elsewhere classified: Secondary | ICD-10-CM | POA: Diagnosis not present

## 2023-07-13 DIAGNOSIS — R531 Weakness: Secondary | ICD-10-CM | POA: Diagnosis not present

## 2023-07-13 DIAGNOSIS — R52 Pain, unspecified: Secondary | ICD-10-CM | POA: Diagnosis not present

## 2023-07-13 DIAGNOSIS — R262 Difficulty in walking, not elsewhere classified: Secondary | ICD-10-CM | POA: Diagnosis not present

## 2023-07-13 DIAGNOSIS — Z789 Other specified health status: Secondary | ICD-10-CM | POA: Diagnosis not present

## 2023-07-13 DIAGNOSIS — Z96642 Presence of left artificial hip joint: Secondary | ICD-10-CM | POA: Diagnosis not present

## 2023-07-13 DIAGNOSIS — R2689 Other abnormalities of gait and mobility: Secondary | ICD-10-CM | POA: Diagnosis not present

## 2023-07-17 DIAGNOSIS — Z96642 Presence of left artificial hip joint: Secondary | ICD-10-CM | POA: Diagnosis not present

## 2023-07-17 DIAGNOSIS — R262 Difficulty in walking, not elsewhere classified: Secondary | ICD-10-CM | POA: Diagnosis not present

## 2023-07-17 DIAGNOSIS — Z789 Other specified health status: Secondary | ICD-10-CM | POA: Diagnosis not present

## 2023-07-17 DIAGNOSIS — R2689 Other abnormalities of gait and mobility: Secondary | ICD-10-CM | POA: Diagnosis not present

## 2023-07-17 DIAGNOSIS — R531 Weakness: Secondary | ICD-10-CM | POA: Diagnosis not present

## 2023-07-17 DIAGNOSIS — R52 Pain, unspecified: Secondary | ICD-10-CM | POA: Diagnosis not present

## 2023-07-19 DIAGNOSIS — R262 Difficulty in walking, not elsewhere classified: Secondary | ICD-10-CM | POA: Diagnosis not present

## 2023-07-19 DIAGNOSIS — Z789 Other specified health status: Secondary | ICD-10-CM | POA: Diagnosis not present

## 2023-07-19 DIAGNOSIS — R52 Pain, unspecified: Secondary | ICD-10-CM | POA: Diagnosis not present

## 2023-07-19 DIAGNOSIS — R2689 Other abnormalities of gait and mobility: Secondary | ICD-10-CM | POA: Diagnosis not present

## 2023-07-19 DIAGNOSIS — Z96642 Presence of left artificial hip joint: Secondary | ICD-10-CM | POA: Diagnosis not present

## 2023-07-19 DIAGNOSIS — R531 Weakness: Secondary | ICD-10-CM | POA: Diagnosis not present

## 2023-07-24 DIAGNOSIS — Z96642 Presence of left artificial hip joint: Secondary | ICD-10-CM | POA: Diagnosis not present

## 2023-07-24 DIAGNOSIS — Z471 Aftercare following joint replacement surgery: Secondary | ICD-10-CM | POA: Diagnosis not present

## 2023-08-10 ENCOUNTER — Encounter (INDEPENDENT_AMBULATORY_CARE_PROVIDER_SITE_OTHER): Payer: Self-pay | Admitting: *Deleted

## 2023-08-21 DIAGNOSIS — H40023 Open angle with borderline findings, high risk, bilateral: Secondary | ICD-10-CM | POA: Diagnosis not present

## 2023-09-25 DIAGNOSIS — I10 Essential (primary) hypertension: Secondary | ICD-10-CM | POA: Diagnosis not present

## 2023-09-25 DIAGNOSIS — Z0000 Encounter for general adult medical examination without abnormal findings: Secondary | ICD-10-CM | POA: Diagnosis not present

## 2023-09-25 DIAGNOSIS — E7800 Pure hypercholesterolemia, unspecified: Secondary | ICD-10-CM | POA: Diagnosis not present

## 2023-09-25 DIAGNOSIS — Z79899 Other long term (current) drug therapy: Secondary | ICD-10-CM | POA: Diagnosis not present

## 2023-09-25 DIAGNOSIS — Z299 Encounter for prophylactic measures, unspecified: Secondary | ICD-10-CM | POA: Diagnosis not present

## 2023-09-25 DIAGNOSIS — R5383 Other fatigue: Secondary | ICD-10-CM | POA: Diagnosis not present

## 2023-10-11 ENCOUNTER — Other Ambulatory Visit: Payer: Self-pay | Admitting: Internal Medicine

## 2023-10-11 DIAGNOSIS — Z1231 Encounter for screening mammogram for malignant neoplasm of breast: Secondary | ICD-10-CM

## 2023-10-17 ENCOUNTER — Inpatient Hospital Stay: Admission: RE | Admit: 2023-10-17 | Payer: Medicare HMO | Source: Ambulatory Visit

## 2023-10-23 ENCOUNTER — Inpatient Hospital Stay: Admission: RE | Admit: 2023-10-23 | Payer: Medicare HMO | Source: Ambulatory Visit

## 2023-10-27 DIAGNOSIS — Z299 Encounter for prophylactic measures, unspecified: Secondary | ICD-10-CM | POA: Diagnosis not present

## 2023-10-27 DIAGNOSIS — R059 Cough, unspecified: Secondary | ICD-10-CM | POA: Diagnosis not present

## 2023-10-27 DIAGNOSIS — U071 COVID-19: Secondary | ICD-10-CM | POA: Diagnosis not present

## 2023-12-12 ENCOUNTER — Inpatient Hospital Stay: Admission: RE | Admit: 2023-12-12 | Payer: Medicare HMO | Source: Ambulatory Visit

## 2024-02-01 DIAGNOSIS — Z299 Encounter for prophylactic measures, unspecified: Secondary | ICD-10-CM | POA: Diagnosis not present

## 2024-02-01 DIAGNOSIS — R350 Frequency of micturition: Secondary | ICD-10-CM | POA: Diagnosis not present

## 2024-02-01 DIAGNOSIS — M545 Low back pain, unspecified: Secondary | ICD-10-CM | POA: Diagnosis not present

## 2024-02-01 DIAGNOSIS — I10 Essential (primary) hypertension: Secondary | ICD-10-CM | POA: Diagnosis not present

## 2024-02-01 DIAGNOSIS — J302 Other seasonal allergic rhinitis: Secondary | ICD-10-CM | POA: Diagnosis not present

## 2024-02-28 DIAGNOSIS — M25552 Pain in left hip: Secondary | ICD-10-CM | POA: Diagnosis not present

## 2024-02-28 DIAGNOSIS — M79652 Pain in left thigh: Secondary | ICD-10-CM | POA: Diagnosis not present

## 2024-02-28 DIAGNOSIS — M5410 Radiculopathy, site unspecified: Secondary | ICD-10-CM | POA: Diagnosis not present

## 2024-02-28 DIAGNOSIS — M79651 Pain in right thigh: Secondary | ICD-10-CM | POA: Diagnosis not present

## 2024-02-28 DIAGNOSIS — Z96642 Presence of left artificial hip joint: Secondary | ICD-10-CM | POA: Diagnosis not present

## 2024-02-28 DIAGNOSIS — M47816 Spondylosis without myelopathy or radiculopathy, lumbar region: Secondary | ICD-10-CM | POA: Diagnosis not present

## 2024-02-28 DIAGNOSIS — Z96643 Presence of artificial hip joint, bilateral: Secondary | ICD-10-CM | POA: Diagnosis not present

## 2024-02-28 DIAGNOSIS — Z96653 Presence of artificial knee joint, bilateral: Secondary | ICD-10-CM | POA: Diagnosis not present

## 2024-02-28 DIAGNOSIS — Z981 Arthrodesis status: Secondary | ICD-10-CM | POA: Diagnosis not present

## 2024-02-28 DIAGNOSIS — Z471 Aftercare following joint replacement surgery: Secondary | ICD-10-CM | POA: Diagnosis not present

## 2024-02-28 DIAGNOSIS — M25551 Pain in right hip: Secondary | ICD-10-CM | POA: Diagnosis not present

## 2024-02-28 DIAGNOSIS — Z4789 Encounter for other orthopedic aftercare: Secondary | ICD-10-CM | POA: Diagnosis not present

## 2024-02-28 DIAGNOSIS — Z96641 Presence of right artificial hip joint: Secondary | ICD-10-CM | POA: Diagnosis not present

## 2024-04-09 ENCOUNTER — Inpatient Hospital Stay: Admission: RE | Admit: 2024-04-09 | Source: Ambulatory Visit

## 2024-06-11 ENCOUNTER — Ambulatory Visit
Admission: RE | Admit: 2024-06-11 | Discharge: 2024-06-11 | Disposition: A | Discharge status: Home / Self Care | Source: Ambulatory Visit | Attending: Internal Medicine | Admitting: Internal Medicine

## 2024-06-11 DIAGNOSIS — Z1231 Encounter for screening mammogram for malignant neoplasm of breast: Secondary | ICD-10-CM | POA: Diagnosis not present

## 2024-06-17 DIAGNOSIS — Z6835 Body mass index (BMI) 35.0-35.9, adult: Secondary | ICD-10-CM | POA: Diagnosis not present

## 2024-06-17 DIAGNOSIS — Z1331 Encounter for screening for depression: Secondary | ICD-10-CM | POA: Diagnosis not present

## 2024-06-17 DIAGNOSIS — Z7189 Other specified counseling: Secondary | ICD-10-CM | POA: Diagnosis not present

## 2024-06-17 DIAGNOSIS — Z1339 Encounter for screening examination for other mental health and behavioral disorders: Secondary | ICD-10-CM | POA: Diagnosis not present

## 2024-06-17 DIAGNOSIS — Z6834 Body mass index (BMI) 34.0-34.9, adult: Secondary | ICD-10-CM | POA: Diagnosis not present

## 2024-06-17 DIAGNOSIS — Z299 Encounter for prophylactic measures, unspecified: Secondary | ICD-10-CM | POA: Diagnosis not present

## 2024-06-17 DIAGNOSIS — E6609 Other obesity due to excess calories: Secondary | ICD-10-CM | POA: Diagnosis not present

## 2024-06-17 DIAGNOSIS — Z0000 Encounter for general adult medical examination without abnormal findings: Secondary | ICD-10-CM | POA: Diagnosis not present

## 2024-06-17 DIAGNOSIS — E2839 Other primary ovarian failure: Secondary | ICD-10-CM | POA: Diagnosis not present

## 2024-06-17 DIAGNOSIS — K5909 Other constipation: Secondary | ICD-10-CM | POA: Diagnosis not present

## 2024-10-08 DIAGNOSIS — I10 Essential (primary) hypertension: Secondary | ICD-10-CM | POA: Diagnosis not present

## 2024-10-08 DIAGNOSIS — Z299 Encounter for prophylactic measures, unspecified: Secondary | ICD-10-CM | POA: Diagnosis not present

## 2024-10-08 DIAGNOSIS — Z0000 Encounter for general adult medical examination without abnormal findings: Secondary | ICD-10-CM | POA: Diagnosis not present

## 2024-10-08 DIAGNOSIS — R5383 Other fatigue: Secondary | ICD-10-CM | POA: Diagnosis not present

## 2024-10-08 DIAGNOSIS — M797 Fibromyalgia: Secondary | ICD-10-CM | POA: Diagnosis not present

## 2024-10-08 DIAGNOSIS — K219 Gastro-esophageal reflux disease without esophagitis: Secondary | ICD-10-CM | POA: Diagnosis not present

## 2024-10-08 DIAGNOSIS — R52 Pain, unspecified: Secondary | ICD-10-CM | POA: Diagnosis not present

## 2024-10-09 DIAGNOSIS — E7800 Pure hypercholesterolemia, unspecified: Secondary | ICD-10-CM | POA: Diagnosis not present

## 2024-10-09 DIAGNOSIS — Z79899 Other long term (current) drug therapy: Secondary | ICD-10-CM | POA: Diagnosis not present

## 2024-10-09 DIAGNOSIS — R5383 Other fatigue: Secondary | ICD-10-CM | POA: Diagnosis not present
